# Patient Record
Sex: Male | Born: 1945 | Race: White | Hispanic: No | Marital: Married | Smoking: Current every day smoker
Health system: Southern US, Community
[De-identification: ages and names within clinical notes are randomized; demographics above are authoritative.]

## PROBLEM LIST (undated history)

## (undated) DIAGNOSIS — Z87442 Personal history of urinary calculi: Secondary | ICD-10-CM

## (undated) DIAGNOSIS — I219 Acute myocardial infarction, unspecified: Secondary | ICD-10-CM

## (undated) DIAGNOSIS — K5790 Diverticulosis of intestine, part unspecified, without perforation or abscess without bleeding: Secondary | ICD-10-CM

## (undated) DIAGNOSIS — N400 Enlarged prostate without lower urinary tract symptoms: Secondary | ICD-10-CM

## (undated) DIAGNOSIS — I739 Peripheral vascular disease, unspecified: Secondary | ICD-10-CM

## (undated) DIAGNOSIS — I779 Disorder of arteries and arterioles, unspecified: Secondary | ICD-10-CM

## (undated) DIAGNOSIS — Z9182 Personal history of military deployment: Secondary | ICD-10-CM

## (undated) DIAGNOSIS — F1090 Alcohol use, unspecified, uncomplicated: Secondary | ICD-10-CM

## (undated) DIAGNOSIS — Z9185 Personal history of military service: Secondary | ICD-10-CM

## (undated) DIAGNOSIS — J40 Bronchitis, not specified as acute or chronic: Secondary | ICD-10-CM

## (undated) DIAGNOSIS — J449 Chronic obstructive pulmonary disease, unspecified: Secondary | ICD-10-CM

## (undated) DIAGNOSIS — E785 Hyperlipidemia, unspecified: Secondary | ICD-10-CM

## (undated) DIAGNOSIS — I2510 Atherosclerotic heart disease of native coronary artery without angina pectoris: Secondary | ICD-10-CM

## (undated) DIAGNOSIS — C801 Malignant (primary) neoplasm, unspecified: Secondary | ICD-10-CM

## (undated) DIAGNOSIS — F17290 Nicotine dependence, other tobacco product, uncomplicated: Secondary | ICD-10-CM

## (undated) DIAGNOSIS — C189 Malignant neoplasm of colon, unspecified: Secondary | ICD-10-CM

## (undated) DIAGNOSIS — K8020 Calculus of gallbladder without cholecystitis without obstruction: Secondary | ICD-10-CM

## (undated) DIAGNOSIS — Z7982 Long term (current) use of aspirin: Secondary | ICD-10-CM

## (undated) DIAGNOSIS — I200 Unstable angina: Secondary | ICD-10-CM

## (undated) DIAGNOSIS — F4312 Post-traumatic stress disorder, chronic: Secondary | ICD-10-CM

## (undated) DIAGNOSIS — I10 Essential (primary) hypertension: Secondary | ICD-10-CM

## (undated) DIAGNOSIS — R918 Other nonspecific abnormal finding of lung field: Secondary | ICD-10-CM

## (undated) DIAGNOSIS — I700 Atherosclerosis of aorta: Secondary | ICD-10-CM

## (undated) DIAGNOSIS — M1990 Unspecified osteoarthritis, unspecified site: Secondary | ICD-10-CM

## (undated) HISTORY — DX: Hyperlipidemia, unspecified: E78.5

---

## 2000-07-25 ENCOUNTER — Emergency Department (HOSPITAL_COMMUNITY): Admission: EM | Admit: 2000-07-25 | Discharge: 2000-07-25 | Payer: Self-pay | Admitting: Emergency Medicine

## 2000-07-25 ENCOUNTER — Encounter: Payer: Self-pay | Admitting: Emergency Medicine

## 2014-01-14 ENCOUNTER — Inpatient Hospital Stay (HOSPITAL_COMMUNITY)
Admission: EM | Admit: 2014-01-14 | Discharge: 2014-01-16 | DRG: 282 | Disposition: A | Discharge status: Home / Self Care | Payer: Non-veteran care | Attending: Internal Medicine | Admitting: Internal Medicine

## 2014-01-14 ENCOUNTER — Emergency Department (HOSPITAL_COMMUNITY): Payer: Non-veteran care

## 2014-01-14 ENCOUNTER — Encounter (HOSPITAL_COMMUNITY): Payer: Self-pay | Admitting: Emergency Medicine

## 2014-01-14 DIAGNOSIS — Z720 Tobacco use: Secondary | ICD-10-CM

## 2014-01-14 DIAGNOSIS — I2510 Atherosclerotic heart disease of native coronary artery without angina pectoris: Secondary | ICD-10-CM | POA: Diagnosis present

## 2014-01-14 DIAGNOSIS — I10 Essential (primary) hypertension: Secondary | ICD-10-CM | POA: Diagnosis not present

## 2014-01-14 DIAGNOSIS — Z79899 Other long term (current) drug therapy: Secondary | ICD-10-CM | POA: Diagnosis not present

## 2014-01-14 DIAGNOSIS — F17200 Nicotine dependence, unspecified, uncomplicated: Secondary | ICD-10-CM | POA: Diagnosis not present

## 2014-01-14 DIAGNOSIS — I214 Non-ST elevation (NSTEMI) myocardial infarction: Secondary | ICD-10-CM | POA: Diagnosis not present

## 2014-01-14 DIAGNOSIS — E785 Hyperlipidemia, unspecified: Secondary | ICD-10-CM

## 2014-01-14 DIAGNOSIS — I219 Acute myocardial infarction, unspecified: Secondary | ICD-10-CM | POA: Diagnosis not present

## 2014-01-14 DIAGNOSIS — R079 Chest pain, unspecified: Secondary | ICD-10-CM | POA: Diagnosis not present

## 2014-01-14 HISTORY — DX: Non-ST elevation (NSTEMI) myocardial infarction: I21.4

## 2014-01-14 HISTORY — DX: Bronchitis, not specified as acute or chronic: J40

## 2014-01-14 HISTORY — DX: Essential (primary) hypertension: I10

## 2014-01-14 LAB — CBC WITH DIFFERENTIAL/PLATELET
Basophils Absolute: 0 10*3/uL (ref 0.0–0.1)
Basophils Relative: 0 % (ref 0–1)
Eosinophils Absolute: 0.1 10*3/uL (ref 0.0–0.7)
Eosinophils Relative: 1 % (ref 0–5)
HCT: 41.4 % (ref 39.0–52.0)
Hemoglobin: 14.5 g/dL (ref 13.0–17.0)
Lymphocytes Relative: 26 % (ref 12–46)
Lymphs Abs: 2.6 10*3/uL (ref 0.7–4.0)
MCH: 31.5 pg (ref 26.0–34.0)
MCHC: 35 g/dL (ref 30.0–36.0)
MCV: 89.8 fL (ref 78.0–100.0)
Monocytes Absolute: 0.9 10*3/uL (ref 0.1–1.0)
Monocytes Relative: 9 % (ref 3–12)
Neutro Abs: 6.5 10*3/uL (ref 1.7–7.7)
Neutrophils Relative %: 64 % (ref 43–77)
Platelets: 245 10*3/uL (ref 150–400)
RBC: 4.61 MIL/uL (ref 4.22–5.81)
RDW: 12.4 % (ref 11.5–15.5)
WBC: 10.1 10*3/uL (ref 4.0–10.5)

## 2014-01-14 LAB — COMPREHENSIVE METABOLIC PANEL
ALBUMIN: 4.5 g/dL (ref 3.5–5.2)
ALK PHOS: 61 U/L (ref 39–117)
ALT: 27 U/L (ref 0–53)
AST: 74 U/L — ABNORMAL HIGH (ref 0–37)
Anion gap: 16 — ABNORMAL HIGH (ref 5–15)
BUN: 13 mg/dL (ref 6–23)
CALCIUM: 9.6 mg/dL (ref 8.4–10.5)
CO2: 23 mEq/L (ref 19–32)
Chloride: 97 mEq/L (ref 96–112)
Creatinine, Ser: 0.87 mg/dL (ref 0.50–1.35)
GFR calc non Af Amer: 87 mL/min — ABNORMAL LOW (ref >90)
GLUCOSE: 110 mg/dL — AB (ref 70–99)
POTASSIUM: 3.8 meq/L (ref 3.7–5.3)
SODIUM: 136 meq/L — AB (ref 137–147)
TOTAL PROTEIN: 7.8 g/dL (ref 6.0–8.3)
Total Bilirubin: 0.5 mg/dL (ref 0.3–1.2)

## 2014-01-14 LAB — TROPONIN I
TROPONIN I: 5.25 ng/mL — AB (ref <0.30)
TROPONIN I: 8.62 ng/mL — AB (ref <0.30)
Troponin I: 6.51 ng/mL (ref <0.30)

## 2014-01-14 LAB — PROTIME-INR
INR: 0.93 (ref 0.00–1.49)
PROTHROMBIN TIME: 12.5 s (ref 11.6–15.2)

## 2014-01-14 LAB — MRSA PCR SCREENING: MRSA BY PCR: NEGATIVE

## 2014-01-14 LAB — TSH: TSH: 1.4 u[IU]/mL (ref 0.350–4.500)

## 2014-01-14 LAB — APTT: aPTT: 30 seconds (ref 24–37)

## 2014-01-14 LAB — HEPARIN LEVEL (UNFRACTIONATED): HEPARIN UNFRACTIONATED: 0.23 [IU]/mL — AB (ref 0.30–0.70)

## 2014-01-14 MED ORDER — ALPRAZOLAM 0.25 MG PO TABS
0.2500 mg | ORAL_TABLET | Freq: Two times a day (BID) | ORAL | Status: DC | PRN
Start: 2014-01-14 — End: 2014-01-16

## 2014-01-14 MED ORDER — ASPIRIN 325 MG PO TABS
325.0000 mg | ORAL_TABLET | Freq: Every day | ORAL | Status: DC
  Administered 2014-01-14: 325 mg via ORAL
  Filled 2014-01-14: qty 1

## 2014-01-14 MED ORDER — ASPIRIN EC 81 MG PO TBEC
81.0000 mg | DELAYED_RELEASE_TABLET | Freq: Every day | ORAL | Status: DC
  Administered 2014-01-16: 81 mg via ORAL
  Filled 2014-01-14 (×2): qty 1

## 2014-01-14 MED ORDER — SODIUM CHLORIDE 0.9 % IV SOLN: 250.0000 mL | INTRAVENOUS | Status: DC | PRN

## 2014-01-14 MED ORDER — SERTRALINE HCL 50 MG PO TABS
50.0000 mg | ORAL_TABLET | Freq: Every day | ORAL | Status: DC
  Administered 2014-01-14 – 2014-01-15 (×2): 50 mg via ORAL
  Filled 2014-01-14 (×3): qty 1

## 2014-01-14 MED ORDER — SODIUM CHLORIDE 0.9 % IJ SOLN
3.0000 mL | Freq: Two times a day (BID) | INTRAMUSCULAR | Status: DC
  Administered 2014-01-14: 3 mL via INTRAVENOUS

## 2014-01-14 MED ORDER — OMEGA-3-ACID ETHYL ESTERS 1 G PO CAPS
1.0000 g | ORAL_CAPSULE | Freq: Two times a day (BID) | ORAL | Status: DC
  Administered 2014-01-14 – 2014-01-16 (×5): 1 g via ORAL
  Filled 2014-01-14 (×6): qty 1

## 2014-01-14 MED ORDER — HYDRALAZINE HCL 20 MG/ML IJ SOLN: 10.0000 mg | INTRAMUSCULAR | Status: DC | PRN

## 2014-01-14 MED ORDER — ASPIRIN 81 MG PO CHEW
324.0000 mg | CHEWABLE_TABLET | ORAL | Status: AC
  Administered 2014-01-15: 324 mg via ORAL
  Filled 2014-01-14: qty 4

## 2014-01-14 MED ORDER — HEPARIN BOLUS VIA INFUSION
4000.0000 [IU] | Freq: Once | INTRAVENOUS | Status: AC
  Administered 2014-01-14: 4000 [IU] via INTRAVENOUS
  Filled 2014-01-14: qty 4000

## 2014-01-14 MED ORDER — SODIUM CHLORIDE 0.9 % IJ SOLN: 3.0000 mL | INTRAMUSCULAR | Status: DC | PRN

## 2014-01-14 MED ORDER — ZOLPIDEM TARTRATE 5 MG PO TABS: 5.0000 mg | ORAL_TABLET | Freq: Every evening | ORAL | Status: DC | PRN

## 2014-01-14 MED ORDER — HEPARIN BOLUS VIA INFUSION
1000.0000 [IU] | Freq: Once | INTRAVENOUS | Status: AC
  Administered 2014-01-14: 1000 [IU] via INTRAVENOUS
  Filled 2014-01-14: qty 1000

## 2014-01-14 MED ORDER — HEPARIN (PORCINE) IN NACL 100-0.45 UNIT/ML-% IJ SOLN
950.0000 [IU]/h | INTRAMUSCULAR | Status: DC
  Administered 2014-01-14: 800 [IU]/h via INTRAVENOUS
  Administered 2014-01-15: 950 [IU]/h via INTRAVENOUS
  Filled 2014-01-14 (×4): qty 250

## 2014-01-14 MED ORDER — NITROGLYCERIN 0.4 MG SL SUBL: 0.4000 mg | SUBLINGUAL_TABLET | SUBLINGUAL | Status: DC | PRN

## 2014-01-14 MED ORDER — CARVEDILOL 6.25 MG PO TABS
6.2500 mg | ORAL_TABLET | Freq: Two times a day (BID) | ORAL | Status: DC
  Administered 2014-01-14 – 2014-01-16 (×4): 6.25 mg via ORAL
  Filled 2014-01-14 (×6): qty 1

## 2014-01-14 MED ORDER — PANTOPRAZOLE SODIUM 40 MG PO TBEC
40.0000 mg | DELAYED_RELEASE_TABLET | Freq: Every day | ORAL | Status: DC
  Administered 2014-01-15 – 2014-01-16 (×2): 40 mg via ORAL
  Filled 2014-01-14 (×2): qty 1

## 2014-01-14 MED ORDER — FAMOTIDINE 20 MG PO TABS: 40.0000 mg | ORAL_TABLET | Freq: Once | ORAL | Status: DC

## 2014-01-14 MED ORDER — ONDANSETRON HCL 4 MG/2ML IJ SOLN
4.0000 mg | Freq: Four times a day (QID) | INTRAMUSCULAR | Status: DC | PRN
Start: 2014-01-14 — End: 2014-01-16

## 2014-01-14 MED ORDER — SODIUM CHLORIDE 0.9 % IJ SOLN
3.0000 mL | Freq: Two times a day (BID) | INTRAMUSCULAR | Status: DC
  Administered 2014-01-15: 3 mL via INTRAVENOUS

## 2014-01-14 MED ORDER — ADULT MULTIVITAMIN W/MINERALS CH
1.0000 | ORAL_TABLET | Freq: Every day | ORAL | Status: DC
  Administered 2014-01-14 – 2014-01-16 (×3): 1 via ORAL
  Filled 2014-01-14 (×3): qty 1

## 2014-01-14 MED ORDER — AMLODIPINE BESYLATE 5 MG PO TABS
5.0000 mg | ORAL_TABLET | Freq: Every day | ORAL | Status: DC
  Administered 2014-01-14 – 2014-01-16 (×3): 5 mg via ORAL
  Filled 2014-01-14 (×3): qty 1

## 2014-01-14 MED ORDER — GI COCKTAIL ~~LOC~~: 30.0000 mL | Freq: Once | ORAL | Status: DC

## 2014-01-14 MED ORDER — ASPIRIN 325 MG PO TABS: 325.0000 mg | ORAL_TABLET | Freq: Every day | ORAL | Status: DC

## 2014-01-14 MED ORDER — ACETAMINOPHEN 325 MG PO TABS
650.0000 mg | ORAL_TABLET | ORAL | Status: DC | PRN
  Administered 2014-01-14: 650 mg via ORAL
  Filled 2014-01-14: qty 2

## 2014-01-14 MED ORDER — SODIUM CHLORIDE 0.9 % IV SOLN
INTRAVENOUS | Status: DC
  Administered 2014-01-14: 1000 mL via INTRAVENOUS

## 2014-01-14 MED ORDER — SODIUM CHLORIDE 0.9 % IV SOLN
1.0000 mL/kg/h | INTRAVENOUS | Status: DC
  Administered 2014-01-15: 1 mL/kg/h via INTRAVENOUS

## 2014-01-14 MED ORDER — NITROGLYCERIN IN D5W 200-5 MCG/ML-% IV SOLN
3.0000 ug/min | INTRAVENOUS | Status: DC
  Administered 2014-01-14: 10 ug/min via INTRAVENOUS

## 2014-01-14 MED ORDER — ATORVASTATIN CALCIUM 80 MG PO TABS
80.0000 mg | ORAL_TABLET | Freq: Every day | ORAL | Status: DC
  Administered 2014-01-14 – 2014-01-15 (×2): 80 mg via ORAL
  Filled 2014-01-14 (×3): qty 1

## 2014-01-14 NOTE — ED Notes (Signed)
Pt to transfer to [facility].

## 2014-01-14 NOTE — ED Notes (Signed)
Family at bedside. 

## 2014-01-14 NOTE — ED Provider Notes (Signed)
CSN: [id number]     Arrival date & time 01/14/14  1310 History   First MD Initiated Contact with Patient 01/14/14 1334     Chief Complaint  Patient presents with  . Chest Pain     (Consider location/radiation/quality/duration/timing/severity/associated sxs/prior Treatment) HPI Comments: Patient here complaining of intermittent chest pain which began yesterday at rest. Symptoms have lasted from 5 seconds to 1 minute in radiate to his shoulders as well as to his jaw. No associated dyspnea diaphoresis. Symptoms resolved spontaneously. No treatment used for this. No prior history of same. Patient does state he seemed to be worsening with lying flat and better with sitting up. Denies any association with food. Did take an antacid without relief. He is currently pain-free  The history is provided by the patient.    Past Medical History  Diagnosis Date  . Bronchitis   . Hypertension    History reviewed. No pertinent past surgical history. No family history on file. History  Substance Use Topics  . Smoking status: Current Every Day Smoker    Types: Cigars  . Smokeless tobacco: Not on file  . Alcohol Use: Yes    Review of Systems  All other systems reviewed and are negative.     Allergies  Review of patient's allergies indicates no known allergies.  Home Medications   Prior to Admission medications   Not on File   BP 179/93  Pulse 71  Resp 18  SpO2 96% Physical Exam  Nursing note and vitals reviewed. Constitutional: He is oriented to person, place, and time. He appears well-developed and well-nourished.  Non-toxic appearance. No distress.  HENT:  Head: Normocephalic and atraumatic.  Eyes: Conjunctivae, EOM and lids are normal. Pupils are equal, round, and reactive to light.  Neck: Normal range of motion. Neck supple. No tracheal deviation present. No mass present.  Cardiovascular: Normal rate, regular rhythm and normal heart sounds.  Exam reveals no gallop.   No murmur  heard. Pulmonary/Chest: Effort normal and breath sounds normal. No stridor. No respiratory distress. He has no decreased breath sounds. He has no wheezes. He has no rhonchi. He has no rales.  Abdominal: Soft. Normal appearance and bowel sounds are normal. He exhibits no distension. There is no tenderness. There is no rebound and no CVA tenderness.  Musculoskeletal: Normal range of motion. He exhibits no edema and no tenderness.  Neurological: He is alert and oriented to person, place, and time. He has normal strength. No cranial nerve deficit or sensory deficit. GCS eye subscore is 4. GCS verbal subscore is 5. GCS motor subscore is 6.  Skin: Skin is warm and dry. No abrasion and no rash noted.  Psychiatric: He has a normal mood and affect. His speech is normal and behavior is normal.    ED Course  Procedures (including critical care time) Labs Review Labs Reviewed  CBC WITH DIFFERENTIAL  TROPONIN I  COMPREHENSIVE METABOLIC PANEL    Imaging Review No results found.   EKG Interpretation None      MDM   Final diagnoses:  None    Date: 01/14/2014  Rate: 63  Rhythm: normal sinus rhythm  QRS Axis: normal  Intervals: normal  ST/T Wave abnormalities: ST depressions inferiorly  Conduction Disutrbances:none  Narrative Interpretation:   Old EKG Reviewed: none available   Patient given aspirin and heparin per pharmacy. Spoke with cardiology and he will be transferred to [facility]    Toy Baker, MD 01/14/14 1445

## 2014-01-14 NOTE — ED Notes (Signed)
Pt present with NAD- Pt reports sudden on set with CP without SOB yesterday after late breakfast "felt funny" central chest radiates to left shoulder comes and goes last 5 seconds to 1 minutes. Numerous episodes since yesterday. Relieves with rest . Episodes can come on for no reason. Denies any other symptoms with episodes.

## 2014-01-14 NOTE — ED Notes (Signed)
ecg hooked up at 13:13 leads would not pick up EKG properly [place], RN in room and [place], NT in room also

## 2014-01-14 NOTE — H&P (Signed)
HPI:  68 y/o male cigar smoker and [place] Vet with h/o HTN (untreated). No known CAD. Admitted for NSTEMI.  Says he began to have intermittent chest pain which began yesterday at rest. Symptoms  Would come and go and radiate to his shoulders as well as to his jaw. No associated dyspnea or diaphoresis. Symptoms resolved spontaneously. Pain worse today so came to [facility].   ECG with sinus rhythm and inferior q waves and TWI. (no old). Initial troponin 6.51. Now CP free. Has received ASA, NTG and IV heparin.     Review of Systems:     Cardiac Review of Systems: {Y] = yes  = no  Chest Pain [  y  ]  Resting SOB [   ] Exertional SOB  [  ]  Orthopnea [  ]   Pedal Edema [   ]    Palpitations [  ] Syncope  [  ]   Presyncope [   ]  General Review of Systems: [Y] = yes [  ]=no Constitional: recent weight change [  ]; anorexia [  ]; fatigue [  ]; nausea [  ]; night sweats [  ]; fever [  ]; or chills [  ];                                                                                                                                          Dental: poor dentition[  ];   Eye : blurred vision [  ]; diplopia [   ]; vision changes [  ];  Amaurosis fugax[  ]; Resp: cough [  ];  wheezing[  ];  hemoptysis[  ]; shortness of breath[  ]; paroxysmal nocturnal dyspnea[  ]; dyspnea on exertion[  ]; or orthopnea[  ];  GI:  gallstones[  ], vomiting[  ];  dysphagia[  ]; melena[  ];  hematochezia [  ]; heartburn[  ];   Hx of  Colonoscopy[  ]; GU: kidney stones [  ]; hematuria[  ];   dysuria [  ];  nocturia[  ];  history of     obstruction [  ];                 Skin: rash, swelling[  ];, hair loss[  ];  peripheral edema[  ];  or itching[  ]; Musculosketetal: myalgias[  ];  joint swelling[  ];  joint erythema[  ];  joint pain[  ];  back pain[  ];  Heme/Lymph: bruising[  ];  bleeding[  ];  anemia[  ];  Neuro: TIA[  ];  headaches[  ];  stroke[  ];  vertigo[  ];  seizures[  ];   paresthesias[  ];  difficulty walking[   ];  Psych:depression[  ]; anxiety[  ];  Endocrine: diabetes[  ];  thyroid dysfunction[  ];  Immunizations: Flu [  ];  Pneumococcal[  ];  Other:  Past Medical History  Diagnosis Date  . Bronchitis   . Hypertension     Prior to Admission medications   Medication Sig Start Date End Date Taking? Authorizing Provider  Multiple Vitamin (MULTIVITAMIN WITH MINERALS) TABS tablet Take 1 tablet by mouth daily.   Yes Historical Provider, MD  omega-3 acid ethyl esters (LOVAZA) 1 G capsule Take 1 g by mouth 2 (two) times daily.   Yes Historical Provider, MD  omeprazole (PRILOSEC) 20 MG capsule Take 20 mg by mouth daily.   Yes Historical Provider, MD  sertraline (ZOLOFT) 100 MG tablet Take 50 mg by mouth daily.   Yes Historical Provider, MD     No Known Allergies  History   Social History  . Marital Status: Married    Spouse Name: N/A    Number of Children: N/A  . Years of Education: N/A   Occupational History  . Not on file.   Social History Main Topics  . Smoking status: Current Every Day Smoker    Types: Cigars  . Smokeless tobacco: Not on file  . Alcohol Use: Yes  . Drug Use: Not on file  . Sexual Activity: Not on file   Other Topics Concern  . Not on file   Social History Narrative  . No narrative on file    Family Hx: No family history of CAD. Father worked in a plant and died of asphyxiation.  Mother died of old age Siblings no CAD  PHYSICAL EXAM: Filed Vitals:   01/14/14 1500  BP: 180/84  Pulse: 64  Resp: 27   General:  Well appearing. No respiratory difficulty HEENT: normal Neck: supple. no JVD. Carotids 2+ bilat; no bruits. No lymphadenopathy or thryomegaly appreciated. Cor: PMI nondisplaced. Regular rate & rhythm. No rubs, gallops or murmurs. Lungs: clear Abdomen: soft, nontender, nondistended. No hepatosplenomegaly. No bruits or masses. Good bowel sounds. Extremities: no cyanosis, clubbing, rash, edema Neuro: alert & oriented x 3, cranial nerves  grossly intact. moves all 4 extremities w/o difficulty. Affect pleasant.  ECG: NSR 63. LVH. Inferior Qs. TWI inferorly and laterally  Results for orders placed during the hospital encounter of 01/14/14 (from the past 24 hour(s))  APTT     Status: None   Collection Time    01/14/14  1:22 PM      Result Value Ref Range   aPTT 30  24 - 37 seconds  PROTIME-INR     Status: None   Collection Time    01/14/14  1:22 PM      Result Value Ref Range   Prothrombin Time 12.5  11.6 - 15.2 seconds   INR 0.93  0.00 - 1.49  TROPONIN I     Status: Abnormal   Collection Time    01/14/14  1:32 PM      Result Value Ref Range   Troponin I 6.51 (*) <0.30 ng/mL  CBC WITH DIFFERENTIAL     Status: None   Collection Time    01/14/14  1:32 PM      Result Value Ref Range   WBC 10.1  4.0 - 10.5 K/uL   RBC 4.61  4.22 - 5.81 MIL/uL   Hemoglobin 14.5  13.0 - 17.0 g/dL   HCT 02.5  85.2 - 77.8 %   MCV 89.8  78.0 - 100.0 fL   MCH 31.5  26.0 - 34.0 pg   MCHC 35.0  30.0 - 36.0 g/dL   RDW 24.2  35.3 -  15.5 %   Platelets 245  150 - 400 K/uL   Neutrophils Relative % 64  43 - 77 %   Neutro Abs 6.5  1.7 - 7.7 K/uL   Lymphocytes Relative 26  12 - 46 %   Lymphs Abs 2.6  0.7 - 4.0 K/uL   Monocytes Relative 9  3 - 12 %   Monocytes Absolute 0.9  0.1 - 1.0 K/uL   Eosinophils Relative 1  0 - 5 %   Eosinophils Absolute 0.1  0.0 - 0.7 K/uL   Basophils Relative 0  0 - 1 %   Basophils Absolute 0.0  0.0 - 0.1 K/uL  COMPREHENSIVE METABOLIC PANEL     Status: Abnormal   Collection Time    01/14/14  1:32 PM      Result Value Ref Range   Sodium 136 (*) 137 - 147 mEq/L   Potassium 3.8  3.7 - 5.3 mEq/L   Chloride 97  96 - 112 mEq/L   CO2 23  19 - 32 mEq/L   Glucose, Bld 110 (*) 70 - 99 mg/dL   BUN 13  6 - 23 mg/dL   Creatinine, Ser 5.40  0.50 - 1.35 mg/dL   Calcium 9.6  8.4 - 98.1 mg/dL   Total Protein 7.8  6.0 - 8.3 g/dL   Albumin 4.5  3.5 - 5.2 g/dL   AST 74 (*) 0 - 37 U/L   ALT 27  0 - 53 U/L   Alkaline Phosphatase  61  39 - 117 U/L   Total Bilirubin 0.5  0.3 - 1.2 mg/dL   GFR calc non Af Amer 87 (*) >90 mL/min   GFR calc Af Amer >90  >90 mL/min   Anion gap 16 (*) 5 - 15   Dg Chest 2 View  01/14/2014   CLINICAL DATA:  Chest pain  EXAM: CHEST  2 VIEW  COMPARISON:  None.  FINDINGS: The cardiomediastinal silhouette is unremarkable.  There is no evidence of focal airspace disease, pulmonary edema, suspicious pulmonary nodule/mass, pleural effusion, or pneumothorax. No acute bony abnormalities are identified.  IMPRESSION: No active cardiopulmonary disease.   Electronically Signed   By: Laveda Abbe M.D.   On: 01/14/2014 14:49     ASSESSMENT: 1. NSTEMI - suspect late presenting inferior MI 2. HTN, poorly controlled 3. Tobacco abuse  PLAN/DISCUSSION:  Will admit to ICU. Continue asa, heparin, IV NTG. Add statin and b-blocker. Will also need BP control. Plan cath tomorrow, sooner if develops refractory pain. Counseled on need for smoking cessation.   Ronald [name] 3:32 PM

## 2014-01-14 NOTE — Progress Notes (Signed)
ANTICOAGULATION CONSULT NOTE - Follow Up Consult  Pharmacy Consult for Heparin Indication: chest pain/ACS  No Known Allergies  Patient Measurements: Height: 5\' 9"  (175.3 cm) Weight: 162 lb 0.6 oz (73.5 kg) IBW/kg (Calculated) : 70.7 Heparin Dosing Weight: 73.5 kg  Vital Signs: Temp: 98.7 F (37.1 C) (09/13 2015) Temp src: Oral (09/13 2015) BP: 102/61 mmHg (09/13 2020) Pulse Rate: 79 (09/13 2020)  Labs:  Recent Labs  01/14/14 1322 01/14/14 1332 01/14/14 1719 01/14/14 2120  HGB  --  14.5  --   --   HCT  --  41.4  --   --   PLT  --  245  --   --   APTT 30  --   --   --   LABPROT 12.5  --   --   --   INR 0.93  --   --   --   HEPARINUNFRC  --   --   --  0.23*  CREATININE  --  0.87  --   --   TROPONINI  --  6.51* 5.25*  --     Estimated Creatinine Clearance: 81.3 ml/min (by C-G formula based on Cr of 0.87).   Medications:  Heparin at 800 units/hr (8 ml/hr)  Assessment: 26 YOM who presented to the [facility] on 9/13 with CP and elevated troponins and was transferred to [facility] for further cardiology work-up - planning for cath on Monday, 9/14. Heparin was started prior to transfer and the first level this evening is slightly SUBtherapeutic (HL 0.23, goal of 0.3-0.7). No overt s/sx of bleeding noted.   Goal of Therapy:  Heparin level 0.3-0.7 units/ml Monitor platelets by anticoagulation protocol: Yes   Plan:  1. Heparin 1000 unit bolus x 1 2. Increase heparin to 950 units/hr (9.5 ml/hr) 3. Will continue to monitor for any signs/symptoms of bleeding and will follow up with heparin level in 6 hours   Georgina Pillion, PharmD, BCPS Clinical Pharmacist Pager: [phone] 01/14/2014 10:08 PM

## 2014-01-14 NOTE — Progress Notes (Signed)
ANTICOAGULATION CONSULT NOTE - Initial Consult  Pharmacy Consult for heparin Indication: chest pain/ACS  No Known Allergies  Patient Measurements: Height: 5\' 9"  (175.3 cm) Weight: 180 lb (81.647 kg) IBW/kg (Calculated) : 70.7 Heparin Dosing Weight: 81.6kg  Vital Signs: BP: 179/93 mmHg (09/13 1321) Pulse Rate: 71 (09/13 1321)  Labs:  Recent Labs  01/14/14 1332  HGB 14.5  HCT 41.4  PLT 245  CREATININE 0.87  TROPONINI 6.51*    Estimated Creatinine Clearance: 81.3 ml/min (by C-G formula based on Cr of 0.87).   Medical History: Past Medical History  Diagnosis Date  . Bronchitis   . Hypertension     Medications:  Scheduled:  . aspirin  325 mg Oral Daily   Infusions:  . sodium chloride 1,000 mL (01/14/14 1343)  . heparin    . heparin      Assessment: 32 yoM admitted 9/13 for r/o ACS. Patient reports central CP radiates to L shoulder. Initial troponins elevated to 6.51, no ST changes seen on initial EKG. Patient not on anticoagulation PTA. Pharmacy has been consulted to dose heparin for r/o ACS. Noted patient to be transferred to [facility], heparin gtt should be started prior to transfer.  Baseline INR normal at 0.93  CBC WNL  SCr WNL, CrCl 81 ml/min  Noted patient to be started on aspirin 325mg  daily  Goal of Therapy:  Heparin level 0.3-0.7 units/ml Monitor platelets by anticoagulation protocol: Yes   Plan:  - heparin bolus via infusion bag of 4000 units x1 - start heparin gtt at 800 units/hr (12 units/kg/hr) - 6 hour heparin level - daily CBC and heparin level - f/u daily aspirin dose 325mg  vs 81mg  - monitor for bleeding  Thank you for the consult.  Ross Ludwig, PharmD, BCPS Pager: [phone] Pharmacy: [phone] 01/14/2014 2:54 PM

## 2014-01-14 NOTE — ED Notes (Signed)
MD at bedside. EDP ALLEN AND EDPA OLIVIA PRESENT

## 2014-01-14 NOTE — ED Notes (Signed)
MD at bedside. 

## 2014-01-15 ENCOUNTER — Encounter (HOSPITAL_COMMUNITY)
Admission: EM | Disposition: A | Discharge status: Home / Self Care | Payer: Self-pay | Source: Home / Self Care | Attending: Internal Medicine

## 2014-01-15 DIAGNOSIS — I2510 Atherosclerotic heart disease of native coronary artery without angina pectoris: Secondary | ICD-10-CM

## 2014-01-15 HISTORY — PX: LEFT HEART CATHETERIZATION WITH CORONARY ANGIOGRAM: SHX5451

## 2014-01-15 HISTORY — DX: Atherosclerotic heart disease of native coronary artery without angina pectoris: I25.10

## 2014-01-15 LAB — CBC
HCT: 38.2 % — ABNORMAL LOW (ref 39.0–52.0)
Hemoglobin: 13.3 g/dL (ref 13.0–17.0)
MCH: 31.3 pg (ref 26.0–34.0)
MCHC: 34.8 g/dL (ref 30.0–36.0)
MCV: 89.9 fL (ref 78.0–100.0)
PLATELETS: 233 10*3/uL (ref 150–400)
RBC: 4.25 MIL/uL (ref 4.22–5.81)
RDW: 12.5 % (ref 11.5–15.5)
WBC: 8.4 10*3/uL (ref 4.0–10.5)

## 2014-01-15 LAB — BASIC METABOLIC PANEL
ANION GAP: 12 (ref 5–15)
BUN: 11 mg/dL (ref 6–23)
CALCIUM: 9.1 mg/dL (ref 8.4–10.5)
CO2: 26 mEq/L (ref 19–32)
Chloride: 96 mEq/L (ref 96–112)
Creatinine, Ser: 0.9 mg/dL (ref 0.50–1.35)
GFR, EST NON AFRICAN AMERICAN: 85 mL/min — AB (ref >90)
Glucose, Bld: 112 mg/dL — ABNORMAL HIGH (ref 70–99)
POTASSIUM: 4.1 meq/L (ref 3.7–5.3)
SODIUM: 134 meq/L — AB (ref 137–147)

## 2014-01-15 LAB — LIPID PANEL
CHOLESTEROL: 231 mg/dL — AB (ref 0–200)
HDL: 66 mg/dL (ref >39)
LDL Cholesterol: 145 mg/dL — ABNORMAL HIGH (ref 0–99)
Total CHOL/HDL Ratio: 3.5 RATIO
Triglycerides: 101 mg/dL (ref <150)
VLDL: 20 mg/dL (ref 0–40)

## 2014-01-15 LAB — TROPONIN I: TROPONIN I: 7.94 ng/mL — AB (ref <0.30)

## 2014-01-15 LAB — HEPARIN LEVEL (UNFRACTIONATED): HEPARIN UNFRACTIONATED: 0.42 [IU]/mL (ref 0.30–0.70)

## 2014-01-15 SURGERY — LEFT HEART CATHETERIZATION WITH CORONARY ANGIOGRAM
Anesthesia: LOCAL

## 2014-01-15 MED ORDER — SODIUM CHLORIDE 0.9 % IJ SOLN: 3.0000 mL | Freq: Two times a day (BID) | INTRAMUSCULAR | Status: DC

## 2014-01-15 MED ORDER — SODIUM CHLORIDE 0.9 % IV SOLN
1.0000 mL/kg/h | INTRAVENOUS | Status: AC
  Administered 2014-01-15: 1 mL/kg/h via INTRAVENOUS

## 2014-01-15 MED ORDER — SODIUM CHLORIDE 0.9 % IJ SOLN: 3.0000 mL | INTRAMUSCULAR | Status: DC | PRN

## 2014-01-15 MED ORDER — HEPARIN SODIUM (PORCINE) 1000 UNIT/ML IJ SOLN
INTRAMUSCULAR | Status: AC
  Filled 2014-01-15: qty 1

## 2014-01-15 MED ORDER — CLOPIDOGREL BISULFATE 300 MG PO TABS
300.0000 mg | ORAL_TABLET | Freq: Once | ORAL | Status: AC
  Administered 2014-01-15: 300 mg via ORAL
  Filled 2014-01-15: qty 1

## 2014-01-15 MED ORDER — CLOPIDOGREL BISULFATE 75 MG PO TABS
75.0000 mg | ORAL_TABLET | Freq: Every day | ORAL | Status: DC
  Administered 2014-01-16: 75 mg via ORAL
  Filled 2014-01-15 (×2): qty 1

## 2014-01-15 MED ORDER — ISOSORBIDE MONONITRATE 15 MG HALF TABLET
15.0000 mg | ORAL_TABLET | Freq: Every day | ORAL | Status: DC
  Administered 2014-01-15 – 2014-01-16 (×2): 15 mg via ORAL
  Filled 2014-01-15 (×2): qty 1

## 2014-01-15 MED ORDER — HEPARIN (PORCINE) IN NACL 2-0.9 UNIT/ML-% IJ SOLN
INTRAMUSCULAR | Status: AC
  Filled 2014-01-15: qty 2000

## 2014-01-15 MED ORDER — FENTANYL CITRATE 0.05 MG/ML IJ SOLN
INTRAMUSCULAR | Status: AC
  Filled 2014-01-15: qty 2

## 2014-01-15 MED ORDER — SODIUM CHLORIDE 0.9 % IV SOLN: 250.0000 mL | INTRAVENOUS | Status: DC | PRN

## 2014-01-15 MED ORDER — MIDAZOLAM HCL 2 MG/2ML IJ SOLN
INTRAMUSCULAR | Status: AC
  Filled 2014-01-15: qty 2

## 2014-01-15 MED ORDER — VERAPAMIL HCL 2.5 MG/ML IV SOLN
INTRAVENOUS | Status: AC
  Filled 2014-01-15: qty 2

## 2014-01-15 NOTE — Progress Notes (Signed)
ANTICOAGULATION CONSULT NOTE - Follow Up Consult  Pharmacy Consult for heparin Indication: NSTEMI  Labs:  Recent Labs  01/14/14 1322  01/14/14 1332 01/14/14 1719 01/14/14 2120 01/14/14 2210 01/15/14 0336 01/15/14 0430  HGB  --   --  14.5  --   --   --  13.3  --   HCT  --   --  41.4  --   --   --  38.2*  --   PLT  --   --  245  --   --   --  233  --   APTT 30  --   --   --   --   --   --   --   LABPROT 12.5  --   --   --   --   --   --   --   INR 0.93  --   --   --   --   --   --   --   HEPARINUNFRC  --   --   --   --  0.23*  --   --  0.42  CREATININE  --   --  0.87  --   --   --  0.90  --   TROPONINI  --   < > 6.51* 5.25*  --  8.62* 7.94*  --   < > = values in this interval not displayed.   Assessment/Plan:  68yo male therapeutic on heparin after rate increase. Will continue gtt at current rate and confirm stable with additional level.   Vernard Gambles, PharmD, BCPS  01/15/2014,5:11 AM

## 2014-01-15 NOTE — CV Procedure (Signed)
    Cardiac Catheterization Procedure Note  Name: Ronald [name] MRN: [id number] DOB: 06/13/1945  Procedure: Left Heart Cath, Selective Coronary Angiography, LV angiography  Indication: NSTEMI. This is a 68 year old gentleman with no history of CAD. He does have untreated hypertension. He presented with prolonged episode of chest pain across 24 hours ago. He's had no recurrence of pain since yesterday. His troponin was elevated at approximately 8. He was referred for cardiac catheterization and possible PCI.   Procedural Details: The right wrist was prepped, draped, and anesthetized with 1% lidocaine. Using the modified Seldinger technique, a 5/6 French Slender sheath was introduced into the right radial artery. 3 mg of verapamil was administered through the sheath, weight-based unfractionated heparin was administered intravenously. Standard Judkins catheters were used for selective coronary angiography and left ventriculography. Catheter exchanges were performed over an exchange length guidewire. There were no immediate procedural complications. A TR band was used for radial hemostasis at the completion of the procedure.  The patient was transferred to the post catheterization recovery area for further monitoring.  Procedural Findings: Hemodynamics: AO 93/48 with a mean of 67 LV 93/4  Coronary angiography: Coronary dominance: right  Left mainstem: The left main is patent. There is no pressure dampening with the catheter well into the left main. There does appear to be mild diffuse proximal left main stem stenosis in the range of 30-40%.  Left anterior descending (LAD): The LAD is moderately calcified. The vessel has diffuse irregularity without any significant high-grade stenoses. There is 30-40% stenosis in the mid vessel. The LAD does reach the LV apex. The first diagonal branch is patent and its more superior subbranch has an 80% stenosis in an area where it divides into twin  vessels.  Left circumflex (LCx): The left circumflex is patent. It is relatively small. It supplies 2 small OM branches with no significant disease noted.  Right coronary artery (RCA): This is a dominant vessel. The proximal and mid vessel are diffusely diseased with 40-50% stenosis present. The vessel is tortuous. It LA branch is widely patent. The PDA branch is small in caliber. This branch is subtotally occluded with 99% stenosis in the proximal vessel. This is a very small caliber vessel that appears 1.5 mm or less in diameter.  Left ventriculography: The inferior wall is severely hypokinetic. The anterolateral and apical walls contract normally. The estimated LV EF is 45-50%.  Contrast: 80 cc  Radiation dose/Fluoro time: 4.3 minutes  Estimated Blood Loss: Minimal  Final Conclusions:   1. Severe subtotal stenosis of the right PDA branch, suspected "culprit lesion"  2. Moderate to severe stenosis of the first diagonal of the LAD with mild diffuse LAD stenosis noted  3. No significant disease in the left circumflex  4. Mild to moderate segmental LV systolic dysfunction  Recommendations: Recommend medical therapy for this patient's diffuse coronary artery disease. His culprit lesion is in a PDA branch that is too small for PCI.  Tonny Bollman MD, [facility] 01/15/2014, 12:33 PM

## 2014-01-15 NOTE — Progress Notes (Signed)
Right radial TR band removed, site level 0 and pulses +3 and equal.  [place], Ronald [name]

## 2014-01-15 NOTE — H&P (View-Only) (Signed)
   Subjective:  Admitted with NSTEMI, on IV Hep/NTG. No CP/SOB. For cath today   Objective:  Temp:  [98 F (36.7 C)-98.7 F (37.1 C)] 98 F (36.7 C) (09/14 0733) Pulse Rate:  [64-87] 65 (09/14 0733) Resp:  [16-27] 16 (09/14 0733) BP: (102-183)/(61-93) 116/74 mmHg (09/14 0733) SpO2:  [88 %-98 %] 95 % (09/14 0733) Weight:  [160 lb 0.9 oz (72.6 kg)-180 lb (81.647 kg)] 160 lb 0.9 oz (72.6 kg) (09/14 0344) Weight change:   Intake/Output from previous day: 09/13 0701 - 09/14 0700 In: 730.4 [P.O.:240; I.V.:490.4] Out: 700 [Urine:700]  Intake/Output from this shift: Total I/O In: 281.3 [I.V.:281.3] Out: -   Physical Exam: General appearance: alert and no distress Neck: no adenopathy, no carotid bruit, no JVD, supple, symmetrical, trachea midline and thyroid not enlarged, symmetric, no tenderness/mass/nodules Lungs: clear to auscultation bilaterally Heart: regular rate and rhythm, S1, S2 normal, no murmur, click, rub or gallop Extremities: extremities normal, atraumatic, no cyanosis or edema  Lab Results: Results for orders placed during the hospital encounter of 01/14/14 (from the past 48 hour(s))  APTT     Status: None   Collection Time    01/14/14  1:22 PM      Result Value Ref Range   aPTT 30  24 - 37 seconds  PROTIME-INR     Status: None   Collection Time    01/14/14  1:22 PM      Result Value Ref Range   Prothrombin Time 12.5  11.6 - 15.2 seconds   INR 0.93  0.00 - 1.49  TROPONIN I     Status: Abnormal   Collection Time    01/14/14  1:32 PM      Result Value Ref Range   Troponin I 6.51 (*) <0.30 ng/mL   Comment:            Due to the release kinetics of cTnI,     a negative result within the first hours     of the onset of symptoms does not rule out     myocardial infarction with certainty.     If myocardial infarction is still suspected,     repeat the test at appropriate intervals.     CRITICAL RESULT CALLED TO, READ BACK BY AND VERIFIED WITH:   A.LASSITER RN AT 1415 ON 13SEP15 BY C.BONGEL  CBC WITH DIFFERENTIAL     Status: None   Collection Time    01/14/14  1:32 PM      Result Value Ref Range   WBC 10.1  4.0 - 10.5 K/uL   RBC 4.61  4.22 - 5.81 MIL/uL   Hemoglobin 14.5  13.0 - 17.0 g/dL   HCT 41.4  39.0 - 52.0 %   MCV 89.8  78.0 - 100.0 fL   MCH 31.5  26.0 - 34.0 pg   MCHC 35.0  30.0 - 36.0 g/dL   RDW 12.4  11.5 - 15.5 %   Platelets 245  150 - 400 K/uL   Neutrophils Relative % 64  43 - 77 %   Neutro Abs 6.5  1.7 - 7.7 K/uL   Lymphocytes Relative 26  12 - 46 %   Lymphs Abs 2.6  0.7 - 4.0 K/uL   Monocytes Relative 9  3 - 12 %   Monocytes Absolute 0.9  0.1 - 1.0 K/uL   Eosinophils Relative 1  0 - 5 %   Eosinophils Absolute 0.1  0.0 - 0.7 K/uL   Basophils   Relative 0  0 - 1 %   Basophils Absolute 0.0  0.0 - 0.1 K/uL  COMPREHENSIVE METABOLIC PANEL     Status: Abnormal   Collection Time    01/14/14  1:32 PM      Result Value Ref Range   Sodium 136 (*) 137 - 147 mEq/L   Potassium 3.8  3.7 - 5.3 mEq/L   Chloride 97  96 - 112 mEq/L   CO2 23  19 - 32 mEq/L   Glucose, Bld 110 (*) 70 - 99 mg/dL   BUN 13  6 - 23 mg/dL   Creatinine, Ser 0.87  0.50 - 1.35 mg/dL   Calcium 9.6  8.4 - 10.5 mg/dL   Total Protein 7.8  6.0 - 8.3 g/dL   Albumin 4.5  3.5 - 5.2 g/dL   AST 74 (*) 0 - 37 U/L   ALT 27  0 - 53 U/L   Alkaline Phosphatase 61  39 - 117 U/L   Total Bilirubin 0.5  0.3 - 1.2 mg/dL   GFR calc non Af Amer 87 (*) >90 mL/min   GFR calc Af Amer >90  >90 mL/min   Comment: (NOTE)     The eGFR has been calculated using the CKD EPI equation.     This calculation has not been validated in all clinical situations.     eGFR's persistently <90 mL/min signify possible Chronic Kidney     Disease.   Anion gap 16 (*) 5 - 15  MRSA PCR SCREENING     Status: None   Collection Time    01/14/14  3:54 PM      Result Value Ref Range   MRSA by PCR NEGATIVE  NEGATIVE   Comment:            The GeneXpert MRSA Assay (FDA     approved for NASAL  specimens     only), is one component of a     comprehensive MRSA colonization     surveillance program. It is not     intended to diagnose MRSA     infection nor to guide or     monitor treatment for     MRSA infections.  TROPONIN I     Status: Abnormal   Collection Time    01/14/14  5:19 PM      Result Value Ref Range   Troponin I 5.25 (*) <0.30 ng/mL   Comment:            Due to the release kinetics of cTnI,     a negative result within the first hours     of the onset of symptoms does not rule out     myocardial infarction with certainty.     If myocardial infarction is still suspected,     repeat the test at appropriate intervals.     CRITICAL RESULT CALLED TO, READ BACK BY AND VERIFIED WITH:     TURNER,S RN 01/14/14 1817 WOOTEN,K  TSH     Status: None   Collection Time    01/14/14  5:19 PM      Result Value Ref Range   TSH 1.400  0.350 - 4.500 uIU/mL  HEPARIN LEVEL (UNFRACTIONATED)     Status: Abnormal   Collection Time    01/14/14  9:20 PM      Result Value Ref Range   Heparin Unfractionated 0.23 (*) 0.30 - 0.70 IU/mL   Comment:  IF HEPARIN RESULTS ARE BELOW     EXPECTED VALUES, AND PATIENT     DOSAGE HAS BEEN CONFIRMED,     SUGGEST FOLLOW UP TESTING     OF ANTITHROMBIN III LEVELS.  TROPONIN I     Status: Abnormal   Collection Time    01/14/14 10:10 PM      Result Value Ref Range   Troponin I 8.62 (*) <0.30 ng/mL   Comment:            Due to the release kinetics of cTnI,     a negative result within the first hours     of the onset of symptoms does not rule out     myocardial infarction with certainty.     If myocardial infarction is still suspected,     repeat the test at appropriate intervals.     CRITICAL VALUE NOTED.  VALUE IS CONSISTENT WITH PREVIOUSLY REPORTED AND CALLED VALUE.  TROPONIN I     Status: Abnormal   Collection Time    01/15/14  3:36 AM      Result Value Ref Range   Troponin I 7.94 (*) <0.30 ng/mL   Comment:            Due to  the release kinetics of cTnI,     a negative result within the first hours     of the onset of symptoms does not rule out     myocardial infarction with certainty.     If myocardial infarction is still suspected,     repeat the test at appropriate intervals.     CRITICAL VALUE NOTED.  VALUE IS CONSISTENT WITH PREVIOUSLY REPORTED AND CALLED VALUE.  CBC     Status: Abnormal   Collection Time    01/15/14  3:36 AM      Result Value Ref Range   WBC 8.4  4.0 - 10.5 K/uL   RBC 4.25  4.22 - 5.81 MIL/uL   Hemoglobin 13.3  13.0 - 17.0 g/dL   HCT 38.2 (*) 39.0 - 52.0 %   MCV 89.9  78.0 - 100.0 fL   MCH 31.3  26.0 - 34.0 pg   MCHC 34.8  30.0 - 36.0 g/dL   RDW 12.5  11.5 - 15.5 %   Platelets 233  150 - 400 K/uL  BASIC METABOLIC PANEL     Status: Abnormal   Collection Time    01/15/14  3:36 AM      Result Value Ref Range   Sodium 134 (*) 137 - 147 mEq/L   Potassium 4.1  3.7 - 5.3 mEq/L   Chloride 96  96 - 112 mEq/L   CO2 26  19 - 32 mEq/L   Glucose, Bld 112 (*) 70 - 99 mg/dL   BUN 11  6 - 23 mg/dL   Creatinine, Ser 0.90  0.50 - 1.35 mg/dL   Calcium 9.1  8.4 - 10.5 mg/dL   GFR calc non Af Amer 85 (*) >90 mL/min   GFR calc Af Amer >90  >90 mL/min   Comment: (NOTE)     The eGFR has been calculated using the CKD EPI equation.     This calculation has not been validated in all clinical situations.     eGFR's persistently <90 mL/min signify possible Chronic Kidney     Disease.   Anion gap 12  5 - 15  LIPID PANEL     Status: Abnormal   Collection Time    01/15/14  3:36 AM      Result Value Ref Range   Cholesterol 231 (*) 0 - 200 mg/dL   Triglycerides 101  <150 mg/dL   HDL 66  >39 mg/dL   Total CHOL/HDL Ratio 3.5     VLDL 20  0 - 40 mg/dL   LDL Cholesterol 145 (*) 0 - 99 mg/dL   Comment:            Total Cholesterol/HDL:CHD Risk     Coronary Heart Disease Risk Table                         Men   Women      1/2 Average Risk   3.4   3.3      Average Risk       5.0   4.4      2 X  Average Risk   9.6   7.1      3 X Average Risk  23.4   11.0                Use the calculated Patient Ratio     above and the CHD Risk Table     to determine the patient's CHD Risk.                ATP III CLASSIFICATION (LDL):      <100     mg/dL   Optimal      100-129  mg/dL   Near or Above                        Optimal      130-159  mg/dL   Borderline      160-189  mg/dL   High      >190     mg/dL   Very High  HEPARIN LEVEL (UNFRACTIONATED)     Status: None   Collection Time    01/15/14  4:30 AM      Result Value Ref Range   Heparin Unfractionated 0.42  0.30 - 0.70 IU/mL   Comment:            IF HEPARIN RESULTS ARE BELOW     EXPECTED VALUES, AND PATIENT     DOSAGE HAS BEEN CONFIRMED,     SUGGEST FOLLOW UP TESTING     OF ANTITHROMBIN III LEVELS.    Imaging: Imaging results have been reviewed  Tele: NSR  Assessment/Plan:   1. Active Problems: 2.   NSTEMI (non-ST elevated myocardial infarction) 3.   Essential hypertension 4.   Time Spent Directly with Patient:  20 minutes  Length of Stay:  LOS: 1 day   Pt admitted for NSTEMI. Trop peaked at 7. EKG showed inferolateral TWI. On IV hep/NTG. Exam benign. Labs OK. Discussed cath today including risk, DES vs BMS. On approp meds.  Ronald [name] 01/15/2014, 10:53 AM 

## 2014-01-15 NOTE — Progress Notes (Signed)
Subjective:  Admitted with NSTEMI, on IV Hep/NTG. No CP/SOB. For cath today   Objective:  Temp:  [98 F (36.7 C)-98.7 F (37.1 C)] 98 F (36.7 C) (09/14 0733) Pulse Rate:  [64-87] 65 (09/14 0733) Resp:  [16-27] 16 (09/14 0733) BP: (102-183)/(61-93) 116/74 mmHg (09/14 0733) SpO2:  [88 %-98 %] 95 % (09/14 0733) Weight:  [160 lb 0.9 oz (72.6 kg)-180 lb (81.647 kg)] 160 lb 0.9 oz (72.6 kg) (09/14 0344) Weight change:   Intake/Output from previous day: 09/13 0701 - 09/14 0700 In: 730.4 [P.O.:240; I.V.:490.4] Out: 700 [Urine:700]  Intake/Output from this shift: Total I/O In: 281.3 [I.V.:281.3] Out: -   Physical Exam: General appearance: alert and no distress Neck: no adenopathy, no carotid bruit, no JVD, supple, symmetrical, trachea midline and thyroid not enlarged, symmetric, no tenderness/mass/nodules Lungs: clear to auscultation bilaterally Heart: regular rate and rhythm, S1, S2 normal, no murmur, click, rub or gallop Extremities: extremities normal, atraumatic, no cyanosis or edema  Lab Results: Results for orders placed during the hospital encounter of 01/14/14 (from the past 48 hour(s))  APTT     Status: None   Collection Time    01/14/14  1:22 PM      Result Value Ref Range   aPTT 30  24 - 37 seconds  PROTIME-INR     Status: None   Collection Time    01/14/14  1:22 PM      Result Value Ref Range   Prothrombin Time 12.5  11.6 - 15.2 seconds   INR 0.93  0.00 - 1.49  TROPONIN I     Status: Abnormal   Collection Time    01/14/14  1:32 PM      Result Value Ref Range   Troponin I 6.51 (*) <0.30 ng/mL   Comment:            Due to the release kinetics of cTnI,     a negative result within the first hours     of the onset of symptoms does not rule out     myocardial infarction with certainty.     If myocardial infarction is still suspected,     repeat the test at appropriate intervals.     CRITICAL RESULT CALLED TO, READ BACK BY AND VERIFIED WITH:   A.LASSITER RN AT 1415 ON 13SEP15 BY C.BONGEL  CBC WITH DIFFERENTIAL     Status: None   Collection Time    01/14/14  1:32 PM      Result Value Ref Range   WBC 10.1  4.0 - 10.5 K/uL   RBC 4.61  4.22 - 5.81 MIL/uL   Hemoglobin 14.5  13.0 - 17.0 g/dL   HCT 41.4  39.0 - 52.0 %   MCV 89.8  78.0 - 100.0 fL   MCH 31.5  26.0 - 34.0 pg   MCHC 35.0  30.0 - 36.0 g/dL   RDW 12.4  11.5 - 15.5 %   Platelets 245  150 - 400 K/uL   Neutrophils Relative % 64  43 - 77 %   Neutro Abs 6.5  1.7 - 7.7 K/uL   Lymphocytes Relative 26  12 - 46 %   Lymphs Abs 2.6  0.7 - 4.0 K/uL   Monocytes Relative 9  3 - 12 %   Monocytes Absolute 0.9  0.1 - 1.0 K/uL   Eosinophils Relative 1  0 - 5 %   Eosinophils Absolute 0.1  0.0 - 0.7 K/uL   Basophils  Relative 0  0 - 1 %   Basophils Absolute 0.0  0.0 - 0.1 K/uL  COMPREHENSIVE METABOLIC PANEL     Status: Abnormal   Collection Time    01/14/14  1:32 PM      Result Value Ref Range   Sodium 136 (*) 137 - 147 mEq/L   Potassium 3.8  3.7 - 5.3 mEq/L   Chloride 97  96 - 112 mEq/L   CO2 23  19 - 32 mEq/L   Glucose, Bld 110 (*) 70 - 99 mg/dL   BUN 13  6 - 23 mg/dL   Creatinine, Ser 0.87  0.50 - 1.35 mg/dL   Calcium 9.6  8.4 - 10.5 mg/dL   Total Protein 7.8  6.0 - 8.3 g/dL   Albumin 4.5  3.5 - 5.2 g/dL   AST 74 (*) 0 - 37 U/L   ALT 27  0 - 53 U/L   Alkaline Phosphatase 61  39 - 117 U/L   Total Bilirubin 0.5  0.3 - 1.2 mg/dL   GFR calc non Af Amer 87 (*) >90 mL/min   GFR calc Af Amer >90  >90 mL/min   Comment: (NOTE)     The eGFR has been calculated using the CKD EPI equation.     This calculation has not been validated in all clinical situations.     eGFR's persistently <90 mL/min signify possible Chronic Kidney     Disease.   Anion gap 16 (*) 5 - 15  MRSA PCR SCREENING     Status: None   Collection Time    01/14/14  3:54 PM      Result Value Ref Range   MRSA by PCR NEGATIVE  NEGATIVE   Comment:            The GeneXpert MRSA Assay (FDA     approved for NASAL  specimens     only), is one component of a     comprehensive MRSA colonization     surveillance program. It is not     intended to diagnose MRSA     infection nor to guide or     monitor treatment for     MRSA infections.  TROPONIN I     Status: Abnormal   Collection Time    01/14/14  5:19 PM      Result Value Ref Range   Troponin I 5.25 (*) <0.30 ng/mL   Comment:            Due to the release kinetics of cTnI,     a negative result within the first hours     of the onset of symptoms does not rule out     myocardial infarction with certainty.     If myocardial infarction is still suspected,     repeat the test at appropriate intervals.     CRITICAL RESULT CALLED TO, READ BACK BY AND VERIFIED WITH:     TURNER,S RN 01/14/14 1817 WOOTEN,K  TSH     Status: None   Collection Time    01/14/14  5:19 PM      Result Value Ref Range   TSH 1.400  0.350 - 4.500 uIU/mL  HEPARIN LEVEL (UNFRACTIONATED)     Status: Abnormal   Collection Time    01/14/14  9:20 PM      Result Value Ref Range   Heparin Unfractionated 0.23 (*) 0.30 - 0.70 IU/mL   Comment:  IF HEPARIN RESULTS ARE BELOW     EXPECTED VALUES, AND PATIENT     DOSAGE HAS BEEN CONFIRMED,     SUGGEST FOLLOW UP TESTING     OF ANTITHROMBIN III LEVELS.  TROPONIN I     Status: Abnormal   Collection Time    01/14/14 10:10 PM      Result Value Ref Range   Troponin I 8.62 (*) <0.30 ng/mL   Comment:            Due to the release kinetics of cTnI,     a negative result within the first hours     of the onset of symptoms does not rule out     myocardial infarction with certainty.     If myocardial infarction is still suspected,     repeat the test at appropriate intervals.     CRITICAL VALUE NOTED.  VALUE IS CONSISTENT WITH PREVIOUSLY REPORTED AND CALLED VALUE.  TROPONIN I     Status: Abnormal   Collection Time    01/15/14  3:36 AM      Result Value Ref Range   Troponin I 7.94 (*) <0.30 ng/mL   Comment:            Due to  the release kinetics of cTnI,     a negative result within the first hours     of the onset of symptoms does not rule out     myocardial infarction with certainty.     If myocardial infarction is still suspected,     repeat the test at appropriate intervals.     CRITICAL VALUE NOTED.  VALUE IS CONSISTENT WITH PREVIOUSLY REPORTED AND CALLED VALUE.  CBC     Status: Abnormal   Collection Time    01/15/14  3:36 AM      Result Value Ref Range   WBC 8.4  4.0 - 10.5 K/uL   RBC 4.25  4.22 - 5.81 MIL/uL   Hemoglobin 13.3  13.0 - 17.0 g/dL   HCT 38.2 (*) 39.0 - 52.0 %   MCV 89.9  78.0 - 100.0 fL   MCH 31.3  26.0 - 34.0 pg   MCHC 34.8  30.0 - 36.0 g/dL   RDW 12.5  11.5 - 15.5 %   Platelets 233  150 - 400 K/uL  BASIC METABOLIC PANEL     Status: Abnormal   Collection Time    01/15/14  3:36 AM      Result Value Ref Range   Sodium 134 (*) 137 - 147 mEq/L   Potassium 4.1  3.7 - 5.3 mEq/L   Chloride 96  96 - 112 mEq/L   CO2 26  19 - 32 mEq/L   Glucose, Bld 112 (*) 70 - 99 mg/dL   BUN 11  6 - 23 mg/dL   Creatinine, Ser 0.90  0.50 - 1.35 mg/dL   Calcium 9.1  8.4 - 10.5 mg/dL   GFR calc non Af Amer 85 (*) >90 mL/min   GFR calc Af Amer >90  >90 mL/min   Comment: (NOTE)     The eGFR has been calculated using the CKD EPI equation.     This calculation has not been validated in all clinical situations.     eGFR's persistently <90 mL/min signify possible Chronic Kidney     Disease.   Anion gap 12  5 - 15  LIPID PANEL     Status: Abnormal   Collection Time    01/15/14  3:36 AM      Result Value Ref Range   Cholesterol 231 (*) 0 - 200 mg/dL   Triglycerides 101  <150 mg/dL   HDL 66  >39 mg/dL   Total CHOL/HDL Ratio 3.5     VLDL 20  0 - 40 mg/dL   LDL Cholesterol 145 (*) 0 - 99 mg/dL   Comment:            Total Cholesterol/HDL:CHD Risk     Coronary Heart Disease Risk Table                         Men   Women      1/2 Average Risk   3.4   3.3      Average Risk       5.0   4.4      2 X  Average Risk   9.6   7.1      3 X Average Risk  23.4   11.0                Use the calculated Patient Ratio     above and the CHD Risk Table     to determine the patient's CHD Risk.                ATP III CLASSIFICATION (LDL):      <100     mg/dL   Optimal      100-129  mg/dL   Near or Above                        Optimal      130-159  mg/dL   Borderline      160-189  mg/dL   High      >190     mg/dL   Very High  HEPARIN LEVEL (UNFRACTIONATED)     Status: None   Collection Time    01/15/14  4:30 AM      Result Value Ref Range   Heparin Unfractionated 0.42  0.30 - 0.70 IU/mL   Comment:            IF HEPARIN RESULTS ARE BELOW     EXPECTED VALUES, AND PATIENT     DOSAGE HAS BEEN CONFIRMED,     SUGGEST FOLLOW UP TESTING     OF ANTITHROMBIN III LEVELS.    Imaging: Imaging results have been reviewed  Tele: NSR  Assessment/Plan:   1. Active Problems: 2.   NSTEMI (non-ST elevated myocardial infarction) 3.   Essential hypertension 4.   Time Spent Directly with Patient:  20 minutes  Length of Stay:  LOS: 1 day   Pt admitted for NSTEMI. Trop peaked at 7. EKG showed inferolateral TWI. On IV hep/NTG. Exam benign. Labs OK. Discussed cath today including risk, DES vs BMS. On approp meds.  Lorretta Harp 01/15/2014, 10:53 AM

## 2014-01-15 NOTE — Interval H&P Note (Signed)
History and Physical Interval Note:  01/15/2014 11:29 AM  Ronald [name]  has presented today for surgery, with the diagnosis of NSTEMI  The various methods of treatment have been discussed with the patient and family. After consideration of risks, benefits and other options for treatment, the patient has consented to  Procedure(s): LEFT HEART CATHETERIZATION WITH CORONARY ANGIOGRAM (N/A) as a surgical intervention .  The patient's history has been reviewed, patient examined, no change in status, stable for surgery.  I have reviewed the patient's chart and labs.  Questions were answered to the patient's satisfaction.    Cath Lab Visit (complete for each Cath Lab visit)  Clinical Evaluation Leading to the Procedure:   ACS: Yes.    Non-ACS:    Anginal Classification: CCS IV  Anti-ischemic medical therapy: Maximal Therapy (2 or more classes of medications)  Non-Invasive Test Results: No non-invasive testing performed  Prior CABG: No previous CABG       Tonny Bollman

## 2014-01-15 NOTE — Progress Notes (Signed)
CARE MANAGEMENT NOTE 01/15/2014  Patient:  Ronald [name], Ronald [name]   Account Number:  [id number]  Date Initiated:  01/15/2014  Documentation initiated by:  Alexys Gassett  Subjective/Objective Assessment:   ruled in for stemi/to cardiac cath [id number]     Action/Plan:   from home lives with spouse and is normally able to be indep. in all adls   Anticipated DC Date:  01/18/2014   Anticipated DC Plan:  HOME/SELF CARE  In-house referral  NA      DC Planning Services  CM consult      PAC Choice  NA   Choice offered to / List presented to:  NA   DME arranged  NA      DME agency  NA     HH arranged  NA      HH agency  NA   Status of service:  In process, will continue to follow Medicare Important Message given?   (If response is "NO", the following Medicare IM given date fields will be blank) Date Medicare IM given:   Medicare IM given by:   Date Additional Medicare IM given:   Additional Medicare IM given by:    Discharge Disposition:    Per UR Regulation:  Reviewed for med. necessity/level of care/duration of stay  If discussed at Long Length of Stay Meetings, dates discussed:    Comments:  Bjorn Loser Lasean Rahming,RN,BSN,CCM

## 2014-01-16 DIAGNOSIS — Z720 Tobacco use: Secondary | ICD-10-CM

## 2014-01-16 DIAGNOSIS — E785 Hyperlipidemia, unspecified: Secondary | ICD-10-CM

## 2014-01-16 LAB — GLUCOSE, CAPILLARY: Glucose-Capillary: 107 mg/dL — ABNORMAL HIGH (ref 70–99)

## 2014-01-16 LAB — CBC
HCT: 35.7 % — ABNORMAL LOW (ref 39.0–52.0)
HEMOGLOBIN: 12.2 g/dL — AB (ref 13.0–17.0)
MCH: 30.8 pg (ref 26.0–34.0)
MCHC: 34.2 g/dL (ref 30.0–36.0)
MCV: 90.2 fL (ref 78.0–100.0)
Platelets: 207 10*3/uL (ref 150–400)
RBC: 3.96 MIL/uL — AB (ref 4.22–5.81)
RDW: 12.5 % (ref 11.5–15.5)
WBC: 7.9 10*3/uL (ref 4.0–10.5)

## 2014-01-16 LAB — HEMOGLOBIN A1C
HEMOGLOBIN A1C: 5.6 % (ref <5.7)
Mean Plasma Glucose: 114 mg/dL (ref <117)

## 2014-01-16 MED ORDER — ASPIRIN 81 MG PO TBEC: 81.0000 mg | DELAYED_RELEASE_TABLET | Freq: Every day | ORAL | Status: DC

## 2014-01-16 MED ORDER — CLOPIDOGREL BISULFATE 75 MG PO TABS: 75.0000 mg | ORAL_TABLET | Freq: Every day | ORAL | Status: DC

## 2014-01-16 MED ORDER — ISOSORBIDE MONONITRATE ER 30 MG PO TB24: 15.0000 mg | ORAL_TABLET | Freq: Every day | ORAL | Status: DC

## 2014-01-16 MED ORDER — ATORVASTATIN CALCIUM 80 MG PO TABS: 80.0000 mg | ORAL_TABLET | Freq: Every day | ORAL | Status: DC

## 2014-01-16 MED ORDER — PANTOPRAZOLE SODIUM 40 MG PO TBEC: 40.0000 mg | DELAYED_RELEASE_TABLET | Freq: Every day | ORAL | Status: DC

## 2014-01-16 MED ORDER — LISINOPRIL 2.5 MG PO TABS
2.5000 mg | ORAL_TABLET | Freq: Every day | ORAL | Status: DC
  Filled 2014-01-16: qty 1

## 2014-01-16 MED ORDER — CARVEDILOL 6.25 MG PO TABS: 6.2500 mg | ORAL_TABLET | Freq: Two times a day (BID) | ORAL | Status: DC

## 2014-01-16 MED ORDER — NITROGLYCERIN 0.4 MG SL SUBL: 0.4000 mg | SUBLINGUAL_TABLET | SUBLINGUAL | Status: DC | PRN

## 2014-01-16 MED ORDER — LISINOPRIL 2.5 MG PO TABS: 2.5000 mg | ORAL_TABLET | Freq: Every day | ORAL | Status: DC

## 2014-01-16 NOTE — Progress Notes (Signed)
   Subjective:  No CP/SOB, s/p cath radially by Dr. MC  Objective:  Temp:  [97.9 F (36.6 C)-98.4 F (36.9 C)] 98.4 F (36.9 C) (09/15 0810) Pulse Rate:  [60-73] 73 (09/15 0810) Resp:  [12-21] 21 (09/15 0810) BP: (106-145)/(50-76) 145/72 mmHg (09/15 0810) SpO2:  [93 %-98 %] 97 % (09/15 0810) Weight:  [162 lb 0.6 oz (73.5 kg)] 162 lb 0.6 oz (73.5 kg) (09/15 0300) Weight change: -17 lb 15.4 oz (-8.147 kg)  Intake/Output from previous day: 09/14 0701 - 09/15 0700 In: 1907.4 [P.O.:920; I.V.:987.4] Out: 275 [Urine:275]  Intake/Output from this shift:    Physical Exam: General appearance: alert and no distress Neck: no adenopathy, no carotid bruit, no JVD, supple, symmetrical, trachea midline and thyroid not enlarged, symmetric, no tenderness/mass/nodules Lungs: clear to auscultation bilaterally Heart: regular rate and rhythm, S1, S2 normal, no murmur, click, rub or gallop Extremities: extremities normal, atraumatic, no cyanosis or edema and RRA puncture site OK  Lab Results: Results for orders placed during the hospital encounter of 01/14/14 (from the past 48 hour(s))  APTT     Status: None   Collection Time    01/14/14  1:22 PM      Result Value Ref Range   aPTT 30  24 - 37 seconds  PROTIME-INR     Status: None   Collection Time    01/14/14  1:22 PM      Result Value Ref Range   Prothrombin Time 12.5  11.6 - 15.2 seconds   INR 0.93  0.00 - 1.49  TROPONIN I     Status: Abnormal   Collection Time    01/14/14  1:32 PM      Result Value Ref Range   Troponin I 6.51 (*) <0.30 ng/mL   Comment:            Due to the release kinetics of cTnI,     a negative result within the first hours     of the onset of symptoms does not rule out     myocardial infarction with certainty.     If myocardial infarction is still suspected,     repeat the test at appropriate intervals.     CRITICAL RESULT CALLED TO, READ BACK BY AND VERIFIED WITH:     A.LASSITER RN AT 1415 ON 13SEP15 BY  C.BONGEL  CBC WITH DIFFERENTIAL     Status: None   Collection Time    01/14/14  1:32 PM      Result Value Ref Range   WBC 10.1  4.0 - 10.5 K/uL   RBC 4.61  4.22 - 5.81 MIL/uL   Hemoglobin 14.5  13.0 - 17.0 g/dL   HCT 41.4  39.0 - 52.0 %   MCV 89.8  78.0 - 100.0 fL   MCH 31.5  26.0 - 34.0 pg   MCHC 35.0  30.0 - 36.0 g/dL   RDW 12.4  11.5 - 15.5 %   Platelets 245  150 - 400 K/uL   Neutrophils Relative % 64  43 - 77 %   Neutro Abs 6.5  1.7 - 7.7 K/uL   Lymphocytes Relative 26  12 - 46 %   Lymphs Abs 2.6  0.7 - 4.0 K/uL   Monocytes Relative 9  3 - 12 %   Monocytes Absolute 0.9  0.1 - 1.0 K/uL   Eosinophils Relative 1  0 - 5 %   Eosinophils Absolute 0.1  0.0 - 0.7 K/uL   Basophils   Relative 0  0 - 1 %   Basophils Absolute 0.0  0.0 - 0.1 K/uL  COMPREHENSIVE METABOLIC PANEL     Status: Abnormal   Collection Time    01/14/14  1:32 PM      Result Value Ref Range   Sodium 136 (*) 137 - 147 mEq/L   Potassium 3.8  3.7 - 5.3 mEq/L   Chloride 97  96 - 112 mEq/L   CO2 23  19 - 32 mEq/L   Glucose, Bld 110 (*) 70 - 99 mg/dL   BUN 13  6 - 23 mg/dL   Creatinine, Ser 0.87  0.50 - 1.35 mg/dL   Calcium 9.6  8.4 - 10.5 mg/dL   Total Protein 7.8  6.0 - 8.3 g/dL   Albumin 4.5  3.5 - 5.2 g/dL   AST 74 (*) 0 - 37 U/L   ALT 27  0 - 53 U/L   Alkaline Phosphatase 61  39 - 117 U/L   Total Bilirubin 0.5  0.3 - 1.2 mg/dL   GFR calc non Af Amer 87 (*) >90 mL/min   GFR calc Af Amer >90  >90 mL/min   Comment: (NOTE)     The eGFR has been calculated using the CKD EPI equation.     This calculation has not been validated in all clinical situations.     eGFR's persistently <90 mL/min signify possible Chronic Kidney     Disease.   Anion gap 16 (*) 5 - 15  MRSA PCR SCREENING     Status: None   Collection Time    01/14/14  3:54 PM      Result Value Ref Range   MRSA by PCR NEGATIVE  NEGATIVE   Comment:            The GeneXpert MRSA Assay (FDA     approved for NASAL specimens     only), is one component  of a     comprehensive MRSA colonization     surveillance program. It is not     intended to diagnose MRSA     infection nor to guide or     monitor treatment for     MRSA infections.  TROPONIN I     Status: Abnormal   Collection Time    01/14/14  5:19 PM      Result Value Ref Range   Troponin I 5.25 (*) <0.30 ng/mL   Comment:            Due to the release kinetics of cTnI,     a negative result within the first hours     of the onset of symptoms does not rule out     myocardial infarction with certainty.     If myocardial infarction is still suspected,     repeat the test at appropriate intervals.     CRITICAL RESULT CALLED TO, READ BACK BY AND VERIFIED WITH:     TURNER,S RN 01/14/14 1817 WOOTEN,K  TSH     Status: None   Collection Time    01/14/14  5:19 PM      Result Value Ref Range   TSH 1.400  0.350 - 4.500 uIU/mL  HEPARIN LEVEL (UNFRACTIONATED)     Status: Abnormal   Collection Time    01/14/14  9:20 PM      Result Value Ref Range   Heparin Unfractionated 0.23 (*) 0.30 - 0.70 IU/mL   Comment:              IF HEPARIN RESULTS ARE BELOW     EXPECTED VALUES, AND PATIENT     DOSAGE HAS BEEN CONFIRMED,     SUGGEST FOLLOW UP TESTING     OF ANTITHROMBIN III LEVELS.  TROPONIN I     Status: Abnormal   Collection Time    01/14/14 10:10 PM      Result Value Ref Range   Troponin I 8.62 (*) <0.30 ng/mL   Comment:            Due to the release kinetics of cTnI,     a negative result within the first hours     of the onset of symptoms does not rule out     myocardial infarction with certainty.     If myocardial infarction is still suspected,     repeat the test at appropriate intervals.     CRITICAL VALUE NOTED.  VALUE IS CONSISTENT WITH PREVIOUSLY REPORTED AND CALLED VALUE.  TROPONIN I     Status: Abnormal   Collection Time    01/15/14  3:36 AM      Result Value Ref Range   Troponin I 7.94 (*) <0.30 ng/mL   Comment:            Due to the release kinetics of cTnI,     a  negative result within the first hours     of the onset of symptoms does not rule out     myocardial infarction with certainty.     If myocardial infarction is still suspected,     repeat the test at appropriate intervals.     CRITICAL VALUE NOTED.  VALUE IS CONSISTENT WITH PREVIOUSLY REPORTED AND CALLED VALUE.  CBC     Status: Abnormal   Collection Time    01/15/14  3:36 AM      Result Value Ref Range   WBC 8.4  4.0 - 10.5 K/uL   RBC 4.25  4.22 - 5.81 MIL/uL   Hemoglobin 13.3  13.0 - 17.0 g/dL   HCT 38.2 (*) 39.0 - 52.0 %   MCV 89.9  78.0 - 100.0 fL   MCH 31.3  26.0 - 34.0 pg   MCHC 34.8  30.0 - 36.0 g/dL   RDW 12.5  11.5 - 15.5 %   Platelets 233  150 - 400 K/uL  BASIC METABOLIC PANEL     Status: Abnormal   Collection Time    01/15/14  3:36 AM      Result Value Ref Range   Sodium 134 (*) 137 - 147 mEq/L   Potassium 4.1  3.7 - 5.3 mEq/L   Chloride 96  96 - 112 mEq/L   CO2 26  19 - 32 mEq/L   Glucose, Bld 112 (*) 70 - 99 mg/dL   BUN 11  6 - 23 mg/dL   Creatinine, Ser 0.90  0.50 - 1.35 mg/dL   Calcium 9.1  8.4 - 10.5 mg/dL   GFR calc non Af Amer 85 (*) >90 mL/min   GFR calc Af Amer >90  >90 mL/min   Comment: (NOTE)     The eGFR has been calculated using the CKD EPI equation.     This calculation has not been validated in all clinical situations.     eGFR's persistently <90 mL/min signify possible Chronic Kidney     Disease.   Anion gap 12  5 - 15  LIPID PANEL     Status: Abnormal   Collection Time    01/15/14  3:36 AM      Result Value Ref Range   Cholesterol 231 (*) 0 - 200 mg/dL   Triglycerides 101  <150 mg/dL   HDL 66  >39 mg/dL   Total CHOL/HDL Ratio 3.5     VLDL 20  0 - 40 mg/dL   LDL Cholesterol 145 (*) 0 - 99 mg/dL   Comment:            Total Cholesterol/HDL:CHD Risk     Coronary Heart Disease Risk Table                         Men   Women      1/2 Average Risk   3.4   3.3      Average Risk       5.0   4.4      2 X Average Risk   9.6   7.1      3 X Average  Risk  23.4   11.0                Use the calculated Patient Ratio     above and the CHD Risk Table     to determine the patient's CHD Risk.                ATP III CLASSIFICATION (LDL):      <100     mg/dL   Optimal      100-129  mg/dL   Near or Above                        Optimal      130-159  mg/dL   Borderline      160-189  mg/dL   High      >190     mg/dL   Very High  HEPARIN LEVEL (UNFRACTIONATED)     Status: None   Collection Time    01/15/14  4:30 AM      Result Value Ref Range   Heparin Unfractionated 0.42  0.30 - 0.70 IU/mL   Comment:            IF HEPARIN RESULTS ARE BELOW     EXPECTED VALUES, AND PATIENT     DOSAGE HAS BEEN CONFIRMED,     SUGGEST FOLLOW UP TESTING     OF ANTITHROMBIN III LEVELS.  CBC     Status: Abnormal   Collection Time    01/16/14  3:13 AM      Result Value Ref Range   WBC 7.9  4.0 - 10.5 K/uL   RBC 3.96 (*) 4.22 - 5.81 MIL/uL   Hemoglobin 12.2 (*) 13.0 - 17.0 g/dL   HCT 35.7 (*) 39.0 - 52.0 %   MCV 90.2  78.0 - 100.0 fL   MCH 30.8  26.0 - 34.0 pg   MCHC 34.2  30.0 - 36.0 g/dL   RDW 12.5  11.5 - 15.5 %   Platelets 207  150 - 400 K/uL  GLUCOSE, CAPILLARY     Status: Abnormal   Collection Time    01/16/14  8:14 AM      Result Value Ref Range   Glucose-Capillary 107 (*) 70 - 99 mg/dL   Comment 1 Capillary Sample      Imaging: Imaging results have been reviewed  Assessment/Plan:   1. Active Problems: 2.   NSTEMI (non-ST elevated myocardial infarction) 3.   Essential hypertension 4.     Time Spent Directly with Patient:  20 minutes  Length of Stay:  LOS: 2 days   Pt admitted for NSTEMI. Peak trop around 8. Cath revealed distal PDA/PLA disease. Vessel to small for intervention. 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 Rec med Rx. Pt is on appropriate meds. Discussed CRF mod including smoking cessation. Exam benign. OK for D/C home. ROV with a MLP 2 weeks and with Dr. Burt Knack after that.  Lorretta Harp 01/16/2014, 10:15 AM

## 2014-01-16 NOTE — Progress Notes (Signed)
D/C instructions reviewed with pt and his wife. Copy of instructions given to pt. Pt given handouts on MI, HTN, radial post cath care and all new meds. Pt declined wheelchair, pt has steady gait, pt walked out with wife with belongings.

## 2014-01-16 NOTE — Progress Notes (Signed)
CARDIAC REHAB PHASE I   PRE:  Rate/Rhythm: 69 SR  BP:  Supine:   Sitting: 132/67  Standing:    SaO2:   MODE:  Ambulation: 700 ft   POST:  Rate/Rhythm: 87 SR  BP:  Supine:   Sitting: 146/63  Standing:   SaO2:  0945-1055 Pt tolerated ambulation well without c/o of cp or SOB. VS stable. Pt back to side of bed after walk. Completed MI education with pt. and wife. He voices understanding. Pt agrees to Outpt. CRP in [place], will send referral. Discussed smoking cessation with pt., states that he will slow down. He does not seem very motivated to quit totally. I gave him tips for quitting, quit smart class information and coaching contact number.I have strongly encouraged cessation.  Melina Copa RN 01/16/2014 10:46 AM

## 2014-01-16 NOTE — Discharge Summary (Signed)
Physician Discharge Summary  Patient ID: Ronald [name] MRN: [id number] DOB/AGE: July 14, 1945 68 y.o.  Admit date: 01/14/2014 Discharge date: 01/16/2014 Primary Cardiologist: Dr. Excell Seltzer  Admission Diagnoses: NSTEMI   Discharge Diagnoses:  Active Problems:   NSTEMI (non-ST elevated myocardial infarction)   Essential hypertension   Tobacco abuse   Discharged Condition: stable  Patient Profile: 68 y/o male cigar smoker and [place] Vet with h/o HTN (untreated). No known CAD. Admitted for NSTEMI.  Hospital Course: The patient is a 68 y/o male with h/o tobacco use and HTN but no prior cardiac history who presented to [facility] on 01/14/14 with complaints of intermittent chest pain, occuring at rest and radiating to his shoulders and jaw. ECG demonstrated sinus rhythm and inferior q waves and TWI (new). Initial troponin was 6.51. Subsequently, he was placed on IV heparin and IV nitro and was transferred to [facility] for further care. He was admitted to the ICU. On arrival, his BP was elevated at 180/84. He was started on a BB. A lipid panel revealed an elevated LDL at 145 mg/dL. Statin therapy was initiated. Cardiac enzymes continued to be cycled and peaked at 8.26. He underwent evaluation with a cardiac catheterization. The procedure was performed by Dr. Excell Seltzer. Access was obtained via the right radial artery. 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 He was also found to have moderate to severe stenosis of the first diagonal of the LAD with mild diffuse LAD stenosis for which medical therapy was recommended. He was noted noted to have mild to moderate segmental LV systolic dysfunction. EF was estimated at 45-50%. He left the cath lab in stable condition. He was continued on ASA, Coreg and Lipitor. Plavix, lisinopril and Imdur were also added. His Prilosec was discontinued and replaced by Protonix, in the  setting of Plavix therapy. He had no recurrent chest pain. No post cath complications. His right radial access site remained stable, free from hematoma. His BP remained stable. Smoking cessation was strongly advised. He was last seen and examined by Dr. Allyson Sabal who determined he was stable for discharge home. He is scheduled for 2 week post-hospital f/u with Robbie Lis, PA-C, on 01/29/14 and 6 week follow-up with Dr. Excell Seltzer on 03/01/14.    Consults: None  Significant Diagnostic Studies:  LHC 01/15/14 Procedural Findings:  Hemodynamics:  AO 93/48 with a mean of 67  LV 93/4  Coronary angiography:  Coronary dominance: right  Left mainstem: The left main is patent. There is no pressure dampening with the catheter well into the left main. There does appear to be mild diffuse proximal left main stem stenosis in the range of 30-40%.  Left anterior descending (LAD): The LAD is moderately calcified. The vessel has diffuse irregularity without any significant high-grade stenoses. There is 30-40% stenosis in the mid vessel. The LAD does reach the LV apex. The first diagonal branch is patent and its more superior subbranch has an 80% stenosis in an area where it divides into twin vessels.  Left circumflex (LCx): The left circumflex is patent. It is relatively small. It supplies 2 small OM branches with no significant disease noted.  Right coronary artery (RCA): This is a dominant vessel. The proximal and mid vessel are diffusely diseased with 40-50% stenosis present. The vessel is tortuous. It LA branch is widely patent. The PDA branch is small in caliber. This branch is subtotally occluded  with 99% stenosis in the proximal vessel. This is a very small caliber vessel that appears 1.5 mm or less in diameter.  Left ventriculography: The inferior wall is severely hypokinetic. The anterolateral and apical walls contract normally. The estimated LV EF is 45-50%.  Contrast: 80 cc  Radiation dose/Fluoro time: 4.3  minutes     Treatments: See Hospital Course  Discharge Exam: Blood pressure 145/72, pulse 73, temperature 98.4 F (36.9 C), temperature source Oral, resp. rate 21, height 5\' 9"  (1.753 m), weight 162 lb 0.6 oz (73.5 kg), SpO2 97.00%.   Disposition: 01-Home or Self Care      Discharge Instructions   Amb Referral to Cardiac Rehabilitation    Complete by:  As directed      Diet - low sodium heart healthy    Complete by:  As directed      Increase activity slowly    Complete by:  As directed             Medication List    STOP taking these medications       omeprazole 20 MG capsule  Commonly known as:  PRILOSEC  Replaced by:  pantoprazole 40 MG tablet      TAKE these medications       aspirin 81 MG EC tablet  Take 1 tablet (81 mg total) by mouth daily.     atorvastatin 80 MG tablet  Commonly known as:  LIPITOR  Take 1 tablet (80 mg total) by mouth daily at 6 PM.     carvedilol 6.25 MG tablet  Commonly known as:  COREG  Take 1 tablet (6.25 mg total) by mouth 2 (two) times daily with a meal.     clopidogrel 75 MG tablet  Commonly known as:  PLAVIX  Take 1 tablet (75 mg total) by mouth daily with breakfast.     isosorbide mononitrate 30 MG 24 hr tablet  Commonly known as:  IMDUR  Take 0.5 tablets (15 mg total) by mouth daily.     lisinopril 2.5 MG tablet  Commonly known as:  PRINIVIL,ZESTRIL  Take 1 tablet (2.5 mg total) by mouth daily.     multivitamin with minerals Tabs tablet  Take 1 tablet by mouth daily.     nitroGLYCERIN 0.4 MG SL tablet  Commonly known as:  NITROSTAT  Place 1 tablet (0.4 mg total) under the tongue every 5 (five) minutes x 3 doses as needed for chest pain.     omega-3 acid ethyl esters 1 G capsule  Commonly known as:  LOVAZA  Take 1 g by mouth 2 (two) times daily.     pantoprazole 40 MG tablet  Commonly known as:  PROTONIX  Take 1 tablet (40 mg total) by mouth daily.     sertraline 100 MG tablet  Commonly known as:  ZOLOFT    Take 50 mg by mouth daily.       Follow-up Information   Follow up with Robbie Lis, PA-C On 01/29/2014. (8:00 am (Dr. Earmon Phoenix PA))    Specialty:  Cardiology   Contact information:   [address] [place] [address] [phone]       Follow up with Tonny Bollman, MD On 03/01/2014. (2:15 pm )    Specialty:  Cardiology   Contact information:   [address] [place] [address] [phone]      TIME SPENT ON DISCHARGE, INCLUDING PHYSICIAN TIME: >30 MINUTES  Signed: Robbie Lis 01/16/2014, 2:09 PM

## 2014-01-29 ENCOUNTER — Encounter: Payer: Medicare Other | Admitting: Cardiology

## 2014-02-05 ENCOUNTER — Encounter: Payer: Self-pay | Admitting: Cardiovascular Disease

## 2014-02-05 DIAGNOSIS — I252 Old myocardial infarction: Secondary | ICD-10-CM | POA: Diagnosis not present

## 2014-02-05 DIAGNOSIS — I10 Essential (primary) hypertension: Secondary | ICD-10-CM | POA: Diagnosis not present

## 2014-02-05 DIAGNOSIS — E785 Hyperlipidemia, unspecified: Secondary | ICD-10-CM | POA: Diagnosis not present

## 2014-02-05 DIAGNOSIS — I2510 Atherosclerotic heart disease of native coronary artery without angina pectoris: Secondary | ICD-10-CM | POA: Diagnosis not present

## 2014-02-08 ENCOUNTER — Telehealth (HOSPITAL_COMMUNITY): Payer: Self-pay | Admitting: *Deleted

## 2014-02-08 NOTE — Telephone Encounter (Signed)
Received signed MD order.  Left message to please contact.  Phone number provided.  Next follow up 10/29.  May schedule after appt completed. Alanson Aly, BSN

## 2014-03-01 ENCOUNTER — Ambulatory Visit: Payer: Medicare Other | Admitting: Cardiovascular Disease

## 2014-03-13 DIAGNOSIS — I2510 Atherosclerotic heart disease of native coronary artery without angina pectoris: Secondary | ICD-10-CM | POA: Diagnosis not present

## 2014-03-13 DIAGNOSIS — E785 Hyperlipidemia, unspecified: Secondary | ICD-10-CM | POA: Diagnosis not present

## 2014-03-13 DIAGNOSIS — I10 Essential (primary) hypertension: Secondary | ICD-10-CM | POA: Diagnosis not present

## 2014-03-21 DIAGNOSIS — E780 Pure hypercholesterolemia: Secondary | ICD-10-CM | POA: Diagnosis not present

## 2014-03-21 DIAGNOSIS — I2510 Atherosclerotic heart disease of native coronary artery without angina pectoris: Secondary | ICD-10-CM | POA: Diagnosis not present

## 2014-03-21 DIAGNOSIS — F17209 Nicotine dependence, unspecified, with unspecified nicotine-induced disorders: Secondary | ICD-10-CM | POA: Diagnosis not present

## 2014-03-21 DIAGNOSIS — I10 Essential (primary) hypertension: Secondary | ICD-10-CM | POA: Diagnosis not present

## 2014-04-12 ENCOUNTER — Encounter (HOSPITAL_COMMUNITY): Payer: Self-pay | Admitting: Cardiovascular Disease

## 2014-05-11 DIAGNOSIS — I2510 Atherosclerotic heart disease of native coronary artery without angina pectoris: Secondary | ICD-10-CM | POA: Diagnosis not present

## 2014-05-11 DIAGNOSIS — R0789 Other chest pain: Secondary | ICD-10-CM | POA: Diagnosis not present

## 2014-05-11 DIAGNOSIS — I10 Essential (primary) hypertension: Secondary | ICD-10-CM | POA: Diagnosis not present

## 2014-06-07 DIAGNOSIS — E780 Pure hypercholesterolemia: Secondary | ICD-10-CM | POA: Diagnosis not present

## 2014-06-07 DIAGNOSIS — F17209 Nicotine dependence, unspecified, with unspecified nicotine-induced disorders: Secondary | ICD-10-CM | POA: Diagnosis not present

## 2014-06-07 DIAGNOSIS — I252 Old myocardial infarction: Secondary | ICD-10-CM | POA: Diagnosis not present

## 2014-06-07 DIAGNOSIS — I2510 Atherosclerotic heart disease of native coronary artery without angina pectoris: Secondary | ICD-10-CM | POA: Diagnosis not present

## 2014-07-13 DIAGNOSIS — I2510 Atherosclerotic heart disease of native coronary artery without angina pectoris: Secondary | ICD-10-CM | POA: Diagnosis not present

## 2014-07-13 DIAGNOSIS — E780 Pure hypercholesterolemia: Secondary | ICD-10-CM | POA: Diagnosis not present

## 2014-07-13 DIAGNOSIS — L270 Generalized skin eruption due to drugs and medicaments taken internally: Secondary | ICD-10-CM | POA: Diagnosis not present

## 2014-07-13 DIAGNOSIS — Z77098 Contact with and (suspected) exposure to other hazardous, chiefly nonmedicinal, chemicals: Secondary | ICD-10-CM | POA: Diagnosis not present

## 2014-08-10 DIAGNOSIS — E785 Hyperlipidemia, unspecified: Secondary | ICD-10-CM | POA: Diagnosis not present

## 2014-08-10 DIAGNOSIS — I2510 Atherosclerotic heart disease of native coronary artery without angina pectoris: Secondary | ICD-10-CM | POA: Diagnosis not present

## 2014-08-10 DIAGNOSIS — I10 Essential (primary) hypertension: Secondary | ICD-10-CM | POA: Diagnosis not present

## 2014-08-10 DIAGNOSIS — E780 Pure hypercholesterolemia: Secondary | ICD-10-CM | POA: Diagnosis not present

## 2014-11-12 DIAGNOSIS — I10 Essential (primary) hypertension: Secondary | ICD-10-CM | POA: Diagnosis not present

## 2014-11-12 DIAGNOSIS — E780 Pure hypercholesterolemia: Secondary | ICD-10-CM | POA: Diagnosis not present

## 2015-06-28 DIAGNOSIS — E785 Hyperlipidemia, unspecified: Secondary | ICD-10-CM | POA: Diagnosis not present

## 2015-06-28 DIAGNOSIS — I2510 Atherosclerotic heart disease of native coronary artery without angina pectoris: Secondary | ICD-10-CM | POA: Diagnosis not present

## 2015-06-28 DIAGNOSIS — E7800 Pure hypercholesterolemia, unspecified: Secondary | ICD-10-CM | POA: Diagnosis not present

## 2015-06-28 DIAGNOSIS — I10 Essential (primary) hypertension: Secondary | ICD-10-CM | POA: Diagnosis not present

## 2015-12-05 ENCOUNTER — Emergency Department (HOSPITAL_COMMUNITY)
Admission: EM | Admit: 2015-12-05 | Discharge: 2015-12-06 | Disposition: A | Discharge status: Home / Self Care | Payer: Non-veteran care | Attending: Emergency Medicine | Admitting: Emergency Medicine

## 2015-12-05 DIAGNOSIS — R1012 Left upper quadrant pain: Secondary | ICD-10-CM | POA: Diagnosis present

## 2015-12-05 DIAGNOSIS — I10 Essential (primary) hypertension: Secondary | ICD-10-CM | POA: Diagnosis not present

## 2015-12-05 DIAGNOSIS — N200 Calculus of kidney: Secondary | ICD-10-CM | POA: Diagnosis not present

## 2015-12-05 DIAGNOSIS — N1330 Unspecified hydronephrosis: Secondary | ICD-10-CM | POA: Diagnosis not present

## 2015-12-05 DIAGNOSIS — K5900 Constipation, unspecified: Secondary | ICD-10-CM | POA: Insufficient documentation

## 2015-12-05 DIAGNOSIS — Z7902 Long term (current) use of antithrombotics/antiplatelets: Secondary | ICD-10-CM | POA: Insufficient documentation

## 2015-12-05 DIAGNOSIS — Z7982 Long term (current) use of aspirin: Secondary | ICD-10-CM | POA: Insufficient documentation

## 2015-12-05 DIAGNOSIS — Z79899 Other long term (current) drug therapy: Secondary | ICD-10-CM | POA: Diagnosis not present

## 2015-12-05 DIAGNOSIS — N132 Hydronephrosis with renal and ureteral calculous obstruction: Secondary | ICD-10-CM | POA: Diagnosis not present

## 2015-12-05 DIAGNOSIS — F17210 Nicotine dependence, cigarettes, uncomplicated: Secondary | ICD-10-CM | POA: Diagnosis not present

## 2015-12-05 HISTORY — DX: Acute myocardial infarction, unspecified: I21.9

## 2015-12-06 ENCOUNTER — Encounter (HOSPITAL_COMMUNITY): Payer: Self-pay | Admitting: *Deleted

## 2015-12-06 ENCOUNTER — Emergency Department (HOSPITAL_COMMUNITY): Payer: Non-veteran care

## 2015-12-06 DIAGNOSIS — N132 Hydronephrosis with renal and ureteral calculous obstruction: Secondary | ICD-10-CM | POA: Diagnosis not present

## 2015-12-06 LAB — URINE MICROSCOPIC-ADD ON

## 2015-12-06 LAB — URINALYSIS, ROUTINE W REFLEX MICROSCOPIC
Bilirubin Urine: NEGATIVE
Glucose, UA: NEGATIVE mg/dL
KETONES UR: NEGATIVE mg/dL
LEUKOCYTES UA: NEGATIVE
Nitrite: NEGATIVE
PH: 6 (ref 5.0–8.0)
Protein, ur: NEGATIVE mg/dL
SPECIFIC GRAVITY, URINE: 1.012 (ref 1.005–1.030)

## 2015-12-06 LAB — CBC
HEMATOCRIT: 39.3 % (ref 39.0–52.0)
Hemoglobin: 14.1 g/dL (ref 13.0–17.0)
MCH: 32 pg (ref 26.0–34.0)
MCHC: 35.9 g/dL (ref 30.0–36.0)
MCV: 89.1 fL (ref 78.0–100.0)
Platelets: 236 10*3/uL (ref 150–400)
RBC: 4.41 MIL/uL (ref 4.22–5.81)
RDW: 12.3 % (ref 11.5–15.5)
WBC: 14.9 10*3/uL — AB (ref 4.0–10.5)

## 2015-12-06 LAB — COMPREHENSIVE METABOLIC PANEL
ALT: 27 U/L (ref 17–63)
AST: 37 U/L (ref 15–41)
Albumin: 4.8 g/dL (ref 3.5–5.0)
Alkaline Phosphatase: 56 U/L (ref 38–126)
Anion gap: 8 (ref 5–15)
BUN: 13 mg/dL (ref 6–20)
CHLORIDE: 98 mmol/L — AB (ref 101–111)
CO2: 23 mmol/L (ref 22–32)
Calcium: 8.9 mg/dL (ref 8.9–10.3)
Creatinine, Ser: 0.99 mg/dL (ref 0.61–1.24)
GFR calc Af Amer: >60 mL/min (ref >60)
GFR calc non Af Amer: >60 mL/min (ref >60)
GLUCOSE: 134 mg/dL — AB (ref 65–99)
POTASSIUM: 4.1 mmol/L (ref 3.5–5.1)
Sodium: 129 mmol/L — ABNORMAL LOW (ref 135–145)
Total Bilirubin: 1.1 mg/dL (ref 0.3–1.2)
Total Protein: 7.8 g/dL (ref 6.5–8.1)

## 2015-12-06 LAB — LIPASE, BLOOD: LIPASE: 29 U/L (ref 11–51)

## 2015-12-06 MED ORDER — OXYCODONE-ACETAMINOPHEN 5-325 MG PO TABS
1.0000 | ORAL_TABLET | Freq: Once | ORAL | Status: AC
  Administered 2015-12-06: 1 via ORAL
  Filled 2015-12-06: qty 1

## 2015-12-06 MED ORDER — OXYCODONE-ACETAMINOPHEN 5-325 MG PO TABS: 1.0000 | ORAL_TABLET | ORAL | 0 refills | Status: DC | PRN

## 2015-12-06 NOTE — ED Triage Notes (Signed)
Pt states that Tues he began to have LUQ / side pain; pt states that it was intermittent to begin with; pt states that the pain has gotten progressively worse and now is more constant; pt states that he feels like he has to strain to urinate and still doesn't feel like he completely empties his bladder; pt also states that he normally has 1-2 BM's per day but hasn't been since Wed am or Tues evening; pt denies N/V

## 2015-12-06 NOTE — ED Notes (Signed)
Bed: WA04 Expected date:  Expected time:  Means of arrival:  Comments: 

## 2015-12-06 NOTE — ED Provider Notes (Signed)
[facility]-EMERGENCY DEPT Provider Note   CSN: [id number] Arrival date & time: 12/05/15  2334  First Provider Contact:  First MD Initiated Contact with Patient 12/06/15 [phone]        History   Chief Complaint Chief Complaint  Patient presents with  . Abdominal Pain    HPI Ronald [name] is a 70 y.o. male.  Patient presents with complaint of left sided upper and lateral abdominal pain for the past 2 days. No fever. No nausea or vomiting. He describes waxing and waning pain. It sometimes radiates to the LLQ. He also has urinary symptoms of not feeling like he empties his bladder fully when he urinates. No difficulty starting a stream. His last bowel movement was 3-4 days ago which is a significant change in his usual bowel habit of daily movements.    The history is provided by the patient. No language interpreter was used.  Abdominal Pain   This is a new problem. The problem occurs constantly. The problem has not changed since onset.Associated symptoms include constipation. Pertinent negatives include fever, nausea, vomiting and myalgias.    Past Medical History:  Diagnosis Date  . Bronchitis   . Hypertension   . Myocardial infarction [facility])     Patient Active Problem List   Diagnosis Date Noted  . Tobacco abuse 01/16/2014  . NSTEMI (non-ST elevated myocardial infarction) (HCC) 01/14/2014  . Essential hypertension 01/14/2014    Past Surgical History:  Procedure Laterality Date  . LEFT HEART CATHETERIZATION WITH CORONARY ANGIOGRAM N/A 01/15/2014   Procedure: LEFT HEART CATHETERIZATION WITH CORONARY ANGIOGRAM;  Surgeon: Micheline Chapman, MD;  Location: [facility] CATH LAB;  Service: Cardiovascular;  Laterality: N/A;       Home Medications    Prior to Admission medications   Medication Sig Start Date End Date Taking? Authorizing Provider  aspirin EC 81 MG EC tablet Take 1 tablet (81 mg total) by mouth daily. 01/16/14  Yes Brittainy Sherlynn Carbon, PA-C  metoprolol tartrate (LOPRESSOR) 25  MG tablet Take 12.5 mg by mouth 2 (two) times daily.   Yes Historical Provider, MD  nitroGLYCERIN (NITROSTAT) 0.4 MG SL tablet Place 1 tablet (0.4 mg total) under the tongue every 5 (five) minutes x 3 doses as needed for chest pain. 01/16/14  Yes Brittainy Sherlynn Carbon, PA-C  omeprazole (PRILOSEC OTC) 20 MG tablet Take 20 mg by mouth daily.   Yes Historical Provider, MD  simvastatin (ZOCOR) 40 MG tablet Take 40 mg by mouth daily.   Yes Historical Provider, MD  valsartan (DIOVAN) 80 MG tablet Take 80 mg by mouth daily. 11/02/15  Yes Historical Provider, MD  atorvastatin (LIPITOR) 80 MG tablet Take 1 tablet (80 mg total) by mouth daily at 6 PM. Patient not taking: Reported on 12/06/2015 01/16/14   Brittainy Sherlynn Carbon, PA-C  carvedilol (COREG) 6.25 MG tablet Take 1 tablet (6.25 mg total) by mouth 2 (two) times daily with a meal. Patient not taking: Reported on 12/06/2015 01/16/14   Brittainy Sherlynn Carbon, PA-C  clopidogrel (PLAVIX) 75 MG tablet Take 1 tablet (75 mg total) by mouth daily with breakfast. Patient not taking: Reported on 12/06/2015 01/16/14   Brittainy M Sharol Harness, PA-C  isosorbide mononitrate (IMDUR) 30 MG 24 hr tablet Take 0.5 tablets (15 mg total) by mouth daily. Patient not taking: Reported on 12/06/2015 01/16/14   Brittainy M Sharol Harness, PA-C  lisinopril (PRINIVIL,ZESTRIL) 2.5 MG tablet Take 1 tablet (2.5 mg total) by mouth daily. Patient not taking: Reported on 12/06/2015 01/16/14  Brittainy Sherlynn Carbon, PA-C  pantoprazole (PROTONIX) 40 MG tablet Take 1 tablet (40 mg total) by mouth daily. Patient not taking: Reported on 12/06/2015 01/16/14   Brittainy Sherlynn Carbon, PA-C    Family History No family history on file.  Social History Social History  Substance Use Topics  . Smoking status: Current Every Day Smoker    Types: Cigars  . Smokeless tobacco: Never Used  . Alcohol use Yes     Allergies   Review of patient's allergies indicates no known allergies.   Review of Systems Review of Systems    Constitutional: Negative for chills and fever.  HENT: Negative.   Respiratory: Negative.   Cardiovascular: Negative.   Gastrointestinal: Positive for abdominal pain and constipation. Negative for abdominal distention, nausea and vomiting.  Genitourinary:       See HPI.  Musculoskeletal: Negative.  Negative for myalgias.  Skin: Negative.   Neurological: Negative.      Physical Exam Updated Vital Signs BP 198/88 (BP Location: Left Arm) Comment: RN,Emily made aware of pt. elevated blood pressure. pt. agitated because of the wait time in the ED.  Pulse 72   Temp 98.5 F (36.9 C) (Oral)   Resp 18   Ht  (1.727 m)   Wt 83.9 kg   SpO2 99%   BMI 28.13 kg/m   Physical Exam  Constitutional: He is oriented to person, place, and time. He appears well-developed and well-nourished.  HENT:  Head: Normocephalic.  Neck: Normal range of motion.  Pulmonary/Chest: Effort normal.  Abdominal: Soft. Bowel sounds are normal. He exhibits no distension and no mass. There is tenderness. There is no guarding.    Genitourinary:  Genitourinary Comments: No CVA tenderness.   Musculoskeletal: Normal range of motion.  Neurological: He is alert and oriented to person, place, and time.  Skin: Skin is warm and dry.  Psychiatric: He has a normal mood and affect.     ED Treatments / Results  Labs (all labs ordered are listed, but only abnormal results are displayed) Labs Reviewed  COMPREHENSIVE METABOLIC PANEL - Abnormal; Notable for the following:       Result Value   Sodium 129 (*)    Chloride 98 (*)    Glucose, Bld 134 (*)    All other components within normal limits  CBC - Abnormal; Notable for the following:    WBC 14.9 (*)    All other components within normal limits  URINALYSIS, ROUTINE W REFLEX MICROSCOPIC (NOT AT [facility]) - Abnormal; Notable for the following:    Hgb urine dipstick LARGE (*)    All other components within normal limits  URINE MICROSCOPIC-ADD ON - Abnormal; Notable  for the following:    Squamous Epithelial / LPF 0-5 (*)    Bacteria, UA FEW (*)    All other components within normal limits  LIPASE, BLOOD   Results for orders placed or performed during the hospital encounter of 12/05/15  Lipase, blood  Result Value Ref Range   Lipase 29 11 - 51 U/L  Comprehensive metabolic panel  Result Value Ref Range   Sodium 129 (L) 135 - 145 mmol/L   Potassium 4.1 3.5 - 5.1 mmol/L   Chloride 98 (L) 101 - 111 mmol/L   CO2 23 22 - 32 mmol/L   Glucose, Bld 134 (H) 65 - 99 mg/dL   BUN 13 6 - 20 mg/dL   Creatinine, Ser 8.65 0.61 - 1.24 mg/dL   Calcium 8.9 8.9 - 78.4 mg/dL  Total Protein 7.8 6.5 - 8.1 g/dL   Albumin 4.8 3.5 - 5.0 g/dL   AST 37 15 - 41 U/L   ALT 27 17 - 63 U/L   Alkaline Phosphatase 56 38 - 126 U/L   Total Bilirubin 1.1 0.3 - 1.2 mg/dL   GFR calc non Af Amer >60 >60 mL/min   GFR calc Af Amer >60 >60 mL/min   Anion gap 8 5 - 15  CBC  Result Value Ref Range   WBC 14.9 (H) 4.0 - 10.5 K/uL   RBC 4.41 4.22 - 5.81 MIL/uL   Hemoglobin 14.1 13.0 - 17.0 g/dL   HCT 76.7 20.9 - 47.0 %   MCV 89.1 78.0 - 100.0 fL   MCH 32.0 26.0 - 34.0 pg   MCHC 35.9 30.0 - 36.0 g/dL   RDW 96.2 83.6 - 62.9 %   Platelets 236 150 - 400 K/uL  Urinalysis, Routine w reflex microscopic  Result Value Ref Range   Color, Urine YELLOW YELLOW   APPearance CLEAR CLEAR   Specific Gravity, Urine 1.012 1.005 - 1.030   pH 6.0 5.0 - 8.0   Glucose, UA NEGATIVE NEGATIVE mg/dL   Hgb urine dipstick LARGE (A) NEGATIVE   Bilirubin Urine NEGATIVE NEGATIVE   Ketones, ur NEGATIVE NEGATIVE mg/dL   Protein, ur NEGATIVE NEGATIVE mg/dL   Nitrite NEGATIVE NEGATIVE   Leukocytes, UA NEGATIVE NEGATIVE  Urine microscopic-add on  Result Value Ref Range   Squamous Epithelial / LPF 0-5 (A) NONE SEEN   WBC, UA 6-30 0 - 5 WBC/hpf   RBC / HPF TOO NUMEROUS TO COUNT 0 - 5 RBC/hpf   Bacteria, UA FEW (A) NONE SEEN   Ct Renal Stone Study  Result Date: 12/06/2015 CLINICAL DATA:  Intermittent LEFT  abdominal pain becoming constant. Straining to urinate. Constipation. Leukocytosis and micro hematuria. History of hypertension. EXAM: CT ABDOMEN AND PELVIS WITHOUT CONTRAST TECHNIQUE: Multidetector CT imaging of the abdomen and pelvis was performed following the standard protocol without IV contrast. COMPARISON:  None. FINDINGS: LUNG BASES: Included view of the lung bases are clear. The visualized heart and pericardium are unremarkable. KIDNEYS/BLADDER: Kidneys are orthotopic, demonstrating normal size and morphology. Mild LEFT hydroureteronephrosis to the level ureterovesicular junction. 5 mm LEFT bladder calculus. 3 mm RIGHT upper pole, three 2 mm RIGHT lower pole, 3 mm LEFT lower pole, 2 mm LEFT interpolar, two 2 mm LEFT upper pole nephrolithiasis. Limited assessment for renal masses on this nonenhanced examination. 5 x 4.2 cm exophytic LEFT interpolar cyst. Urinary bladder is well distended with superimposed mild bladder wall thickening. SOLID ORGANS: The liver, spleen, gallbladder, pancreas and adrenal glands are unremarkable for this non-contrast examination. GASTROINTESTINAL TRACT: Small hiatal hernia. The stomach, small and large bowel are normal in course and caliber without inflammatory changes, the sensitivity may be decreased by lack of enteric contrast. Mild amount of retained large bowel stool. Severe sigmoid diverticulosis. Normal appendix. PERITONEUM/RETROPERITONEUM: Aortoiliac vessels are normal in course and caliber, severe calcific atherosclerosis. No lymphadenopathy by CT size criteria. Prostatomegaly. No intraperitoneal free fluid nor free air. SOFT TISSUES/ OSSEOUS STRUCTURES: Nonsuspicious. Moderate bilateral fat containing inguinal hernias. IMPRESSION: Mild LEFT hydroureteronephrosis, 5 mm LEFT bladder calculus is likely recently passed. Bilateral nephrolithiasis measure up to 3 mm. Mild urinary bladder wall thickening concerning for cystitis. Electronically Signed   By: Awilda Metro  M.D.   On: 12/06/2015 05:12     EKG  EKG Interpretation None       Radiology No results found.  Procedures Procedures (including critical care time)  Medications Ordered in ED Medications  oxyCODONE-acetaminophen (PERCOCET/ROXICET) 5-325 MG per tablet 1 tablet (not administered)     Initial Impression / Assessment and Plan / ED Course  I have reviewed the triage vital signs and the nursing notes.  Pertinent labs & imaging results that were available during my care of the patient were reviewed by me and considered in my medical decision making (see chart for details).  Clinical Course  Value Comment By Time  CT Renal [place] (Reviewed) Elpidio Anis, PA-C 08/07 [phone]    Patient presents with Left sided abdominal discomfort. He is found to have hematuria, no history of kidney stones. No fever, nausea or vomiting. CT scan performed to evaluate for stone and there is a 5mm stone seen, likely passed into the bladder. The patient's pain is controlled. He is felt stable for discharge home.   Final Clinical Impressions(s) / ED Diagnoses   Final diagnoses:  None   1. Left kidney stone New Prescriptions New Prescriptions   No medications on file     Elpidio Anis, PA-C 12/09/15 [id number]    Derwood Kaplan, MD 12/09/15 [phone]

## 2015-12-17 DIAGNOSIS — N202 Calculus of kidney with calculus of ureter: Secondary | ICD-10-CM | POA: Diagnosis not present

## 2017-03-24 DIAGNOSIS — I10 Essential (primary) hypertension: Secondary | ICD-10-CM | POA: Diagnosis not present

## 2017-03-24 DIAGNOSIS — Z136 Encounter for screening for cardiovascular disorders: Secondary | ICD-10-CM | POA: Diagnosis not present

## 2017-03-24 DIAGNOSIS — E785 Hyperlipidemia, unspecified: Secondary | ICD-10-CM | POA: Diagnosis not present

## 2017-03-24 DIAGNOSIS — I2510 Atherosclerotic heart disease of native coronary artery without angina pectoris: Secondary | ICD-10-CM | POA: Diagnosis not present

## 2017-04-06 DIAGNOSIS — I2510 Atherosclerotic heart disease of native coronary artery without angina pectoris: Secondary | ICD-10-CM | POA: Diagnosis not present

## 2017-04-06 DIAGNOSIS — E7800 Pure hypercholesterolemia, unspecified: Secondary | ICD-10-CM | POA: Diagnosis not present

## 2017-04-06 DIAGNOSIS — Z136 Encounter for screening for cardiovascular disorders: Secondary | ICD-10-CM | POA: Diagnosis not present

## 2017-04-06 DIAGNOSIS — E785 Hyperlipidemia, unspecified: Secondary | ICD-10-CM | POA: Diagnosis not present

## 2017-07-14 IMAGING — CT CT RENAL STONE PROTOCOL
2 of 3 series · 16 of 46 positions shown, 18 images · non-contrast
Comparison: None.

CLINICAL DATA: Intermittent LEFT abdominal pain becoming constant.
Straining to urinate. Constipation. Leukocytosis and micro
hematuria. History of hypertension.

EXAM:
CT ABDOMEN AND PELVIS WITHOUT CONTRAST
TECHNIQUE: Multidetector CT imaging of the abdomen and pelvis was performed
following the standard protocol without IV contrast.

[Series 3: lung · axial · 0.71mm/px · z∈[+1422,+1500]mm · 13 of 45 slices shown, 15 images]
[im 3/45  soft-tissue]
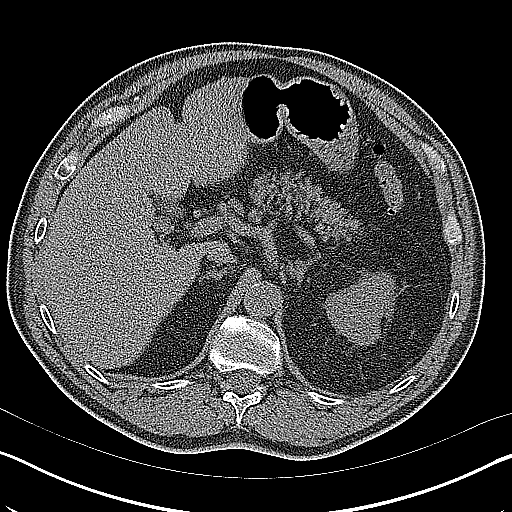
[im 3/45  bone]
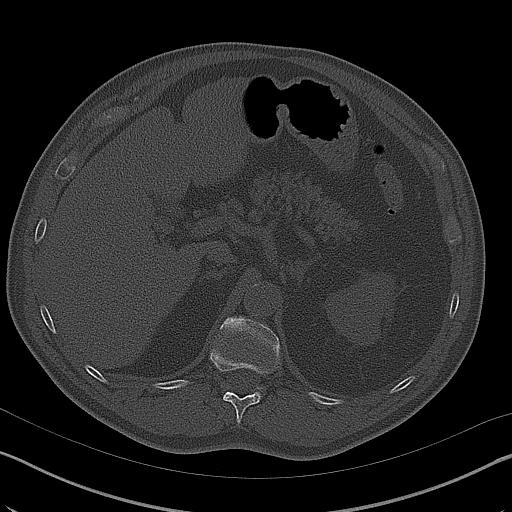
[im 6/45  soft-tissue]
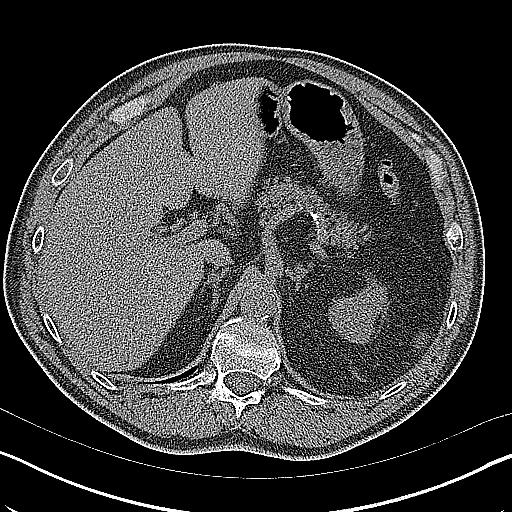
[im 9/45  soft-tissue]
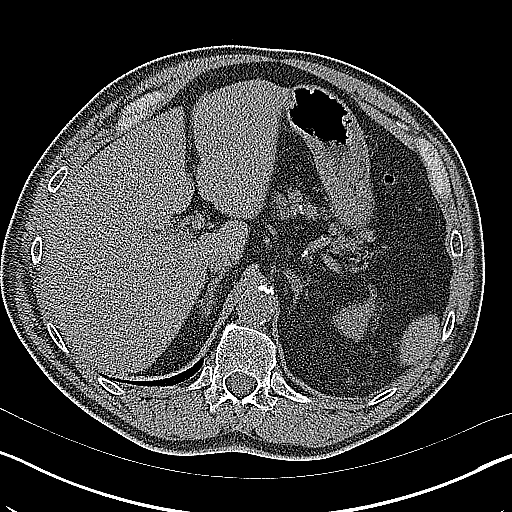
[im 13/45  soft-tissue]
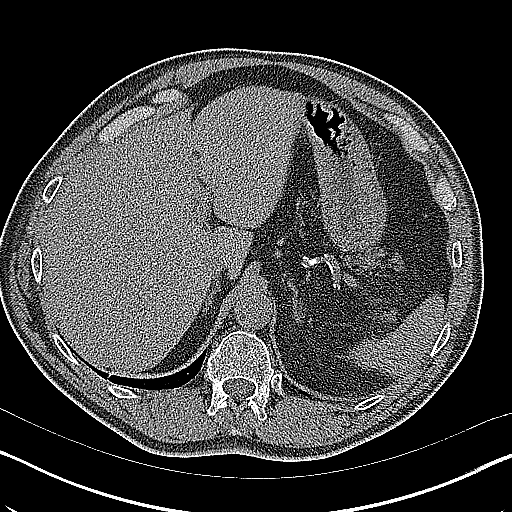
[im 16/45  soft-tissue]
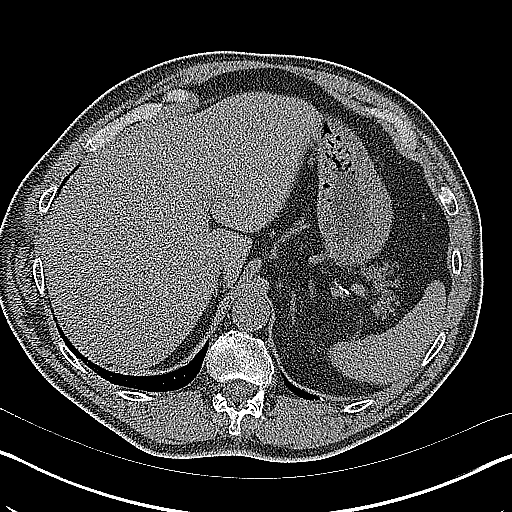
[im 19/45  soft-tissue]
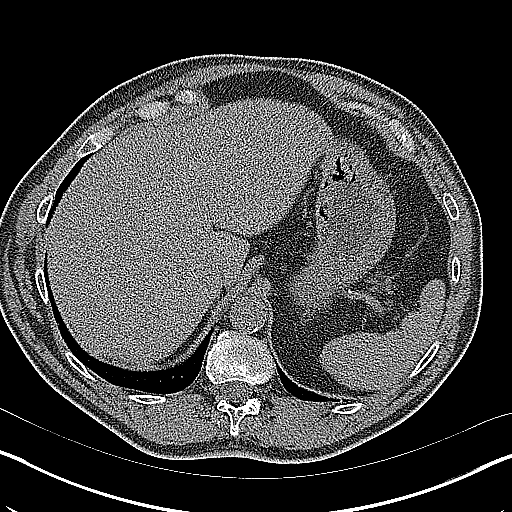
[im 23/45  soft-tissue]
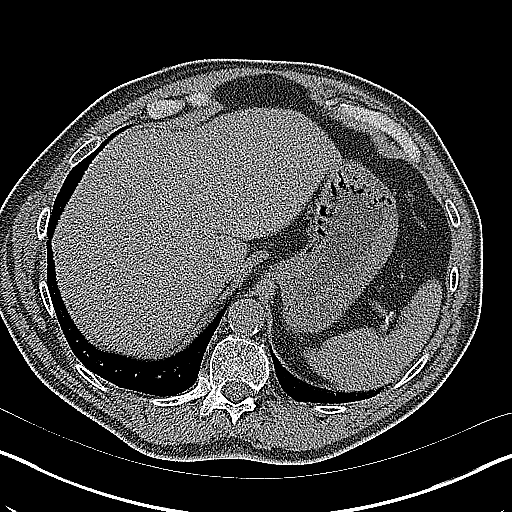
[im 26/45  soft-tissue]
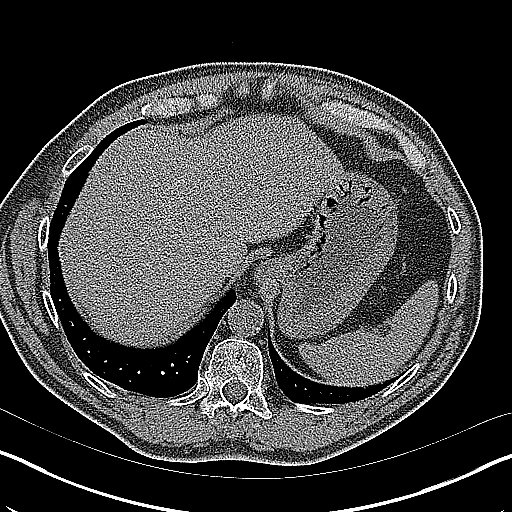
[im 29/45  soft-tissue]
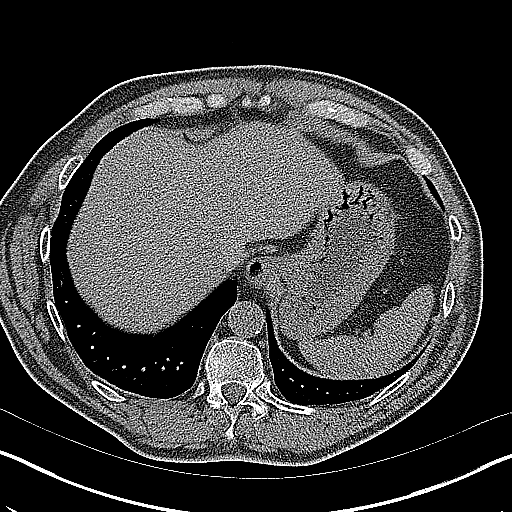
[im 29/45  bone]
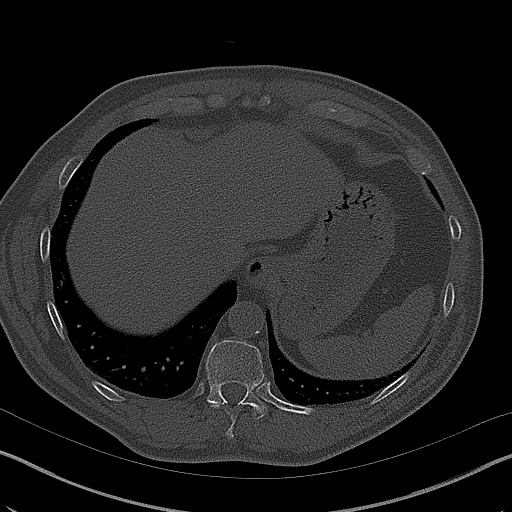
[im 32/45  soft-tissue]
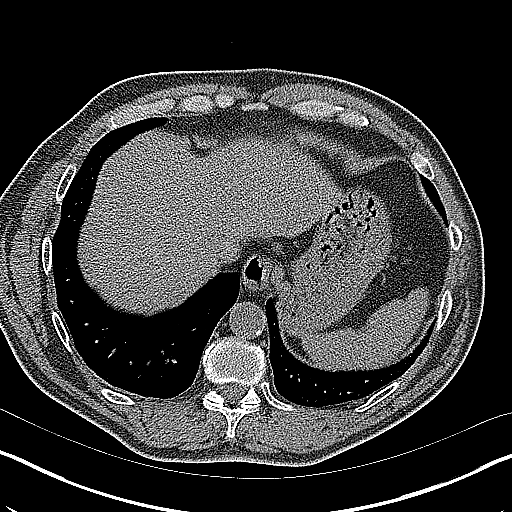
[im 36/45  soft-tissue]
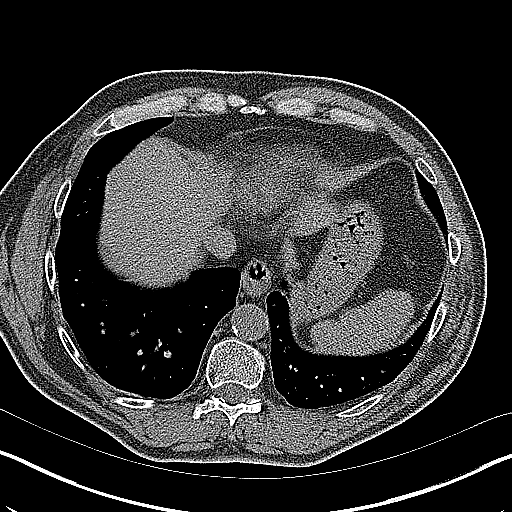
[im 39/45  soft-tissue]
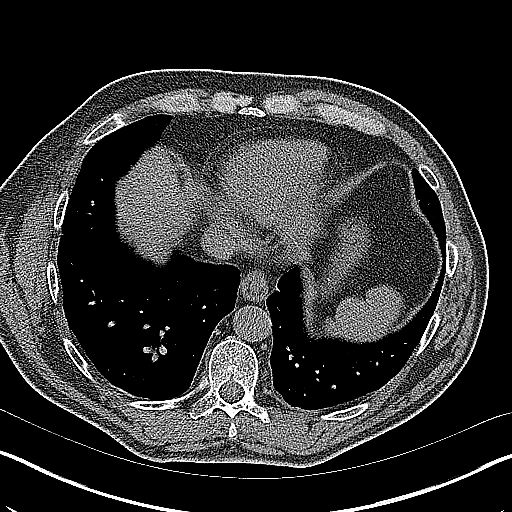
[im 42/45  soft-tissue]
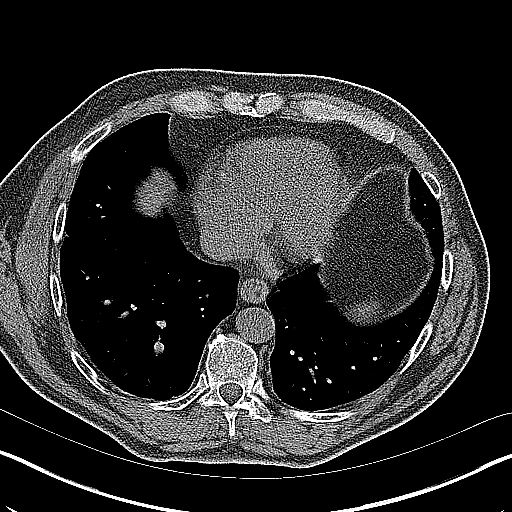

[Series 4: coronal · coronal · 0.66mm/px · 3 of 149 slices shown]
[im 50/149  soft-tissue]
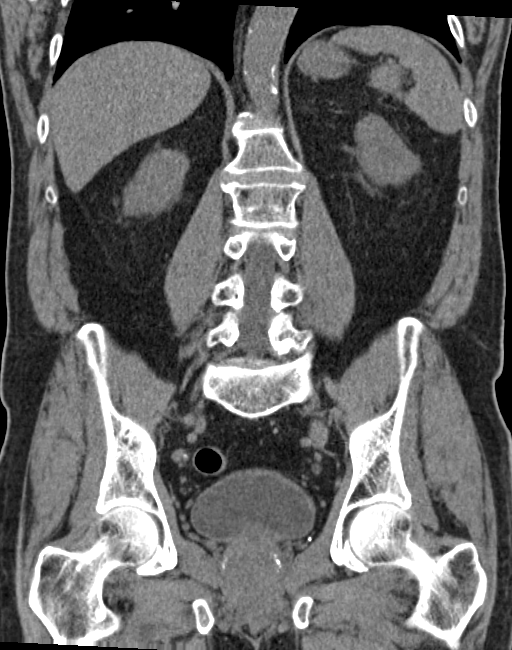
[im 66/149  soft-tissue]
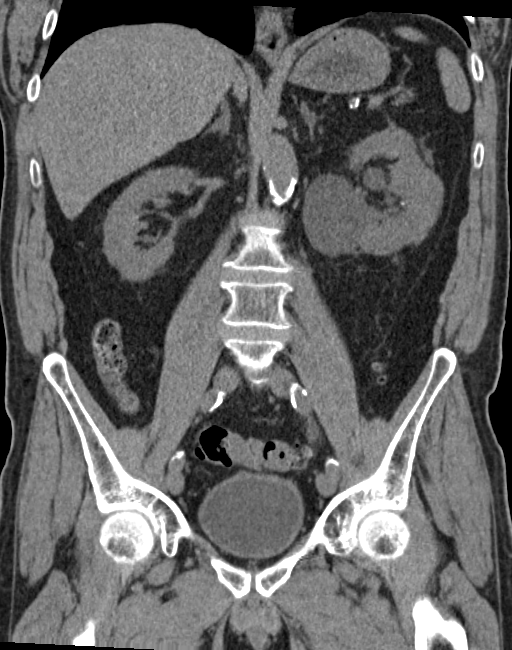
[im 83/149  soft-tissue]
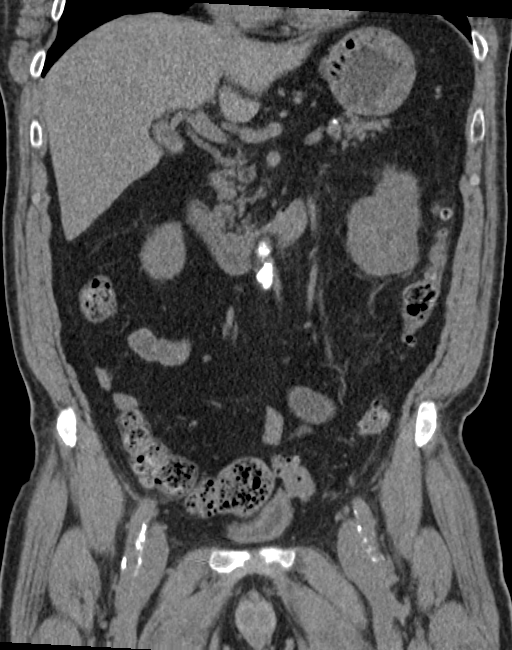

[16 of 46 positions shown; findings below may reference images not displayed]

FINDINGS: LUNG BASES: Included view of the lung bases are clear. The
visualized heart and pericardium are unremarkable.

KIDNEYS/BLADDER: Kidneys are orthotopic, demonstrating normal size
and morphology. Mild LEFT hydroureteronephrosis to the level
ureterovesicular junction. 5 mm LEFT bladder calculus. 3 mm RIGHT
upper pole, three 2 mm RIGHT lower pole, 3 mm LEFT lower pole, 2 mm
LEFT interpolar, two 2 mm LEFT upper pole nephrolithiasis. Limited
assessment for renal masses on this nonenhanced examination. 5 x
cm exophytic LEFT interpolar cyst. Urinary bladder is well distended
with superimposed mild bladder wall thickening.

SOLID ORGANS: The liver, spleen, gallbladder, pancreas and adrenal
glands are unremarkable for this non-contrast examination.

GASTROINTESTINAL TRACT: Small hiatal hernia. The stomach, small and
large bowel are normal in course and caliber without inflammatory
changes, the sensitivity may be decreased by lack of enteric
contrast. Mild amount of retained large bowel stool. Severe sigmoid
diverticulosis. Normal appendix.

PERITONEUM/RETROPERITONEUM: Aortoiliac vessels are normal in course
and caliber, severe calcific atherosclerosis. No lymphadenopathy by
CT size criteria. Prostatomegaly. No intraperitoneal free fluid nor
free air.

SOFT TISSUES/ OSSEOUS STRUCTURES: Nonsuspicious. Moderate bilateral
fat containing inguinal hernias.
IMPRESSION: Mild LEFT hydroureteronephrosis, 5 mm LEFT bladder calculus is
likely recently passed.

Bilateral nephrolithiasis measure up to 3 mm.

Mild urinary bladder wall thickening concerning for cystitis.

## 2018-03-24 DIAGNOSIS — E785 Hyperlipidemia, unspecified: Secondary | ICD-10-CM | POA: Diagnosis not present

## 2018-03-24 DIAGNOSIS — I10 Essential (primary) hypertension: Secondary | ICD-10-CM | POA: Diagnosis not present

## 2018-03-24 DIAGNOSIS — I25118 Atherosclerotic heart disease of native coronary artery with other forms of angina pectoris: Secondary | ICD-10-CM | POA: Diagnosis not present

## 2018-03-24 DIAGNOSIS — R0609 Other forms of dyspnea: Secondary | ICD-10-CM | POA: Diagnosis not present

## 2018-03-30 DIAGNOSIS — E785 Hyperlipidemia, unspecified: Secondary | ICD-10-CM | POA: Diagnosis not present

## 2018-03-30 DIAGNOSIS — R9431 Abnormal electrocardiogram [ECG] [EKG]: Secondary | ICD-10-CM | POA: Diagnosis not present

## 2018-03-30 DIAGNOSIS — I25118 Atherosclerotic heart disease of native coronary artery with other forms of angina pectoris: Secondary | ICD-10-CM | POA: Diagnosis not present

## 2018-03-30 DIAGNOSIS — R0602 Shortness of breath: Secondary | ICD-10-CM | POA: Diagnosis not present

## 2018-04-11 DIAGNOSIS — I25118 Atherosclerotic heart disease of native coronary artery with other forms of angina pectoris: Secondary | ICD-10-CM | POA: Diagnosis not present

## 2018-04-11 DIAGNOSIS — R0602 Shortness of breath: Secondary | ICD-10-CM | POA: Diagnosis not present

## 2018-04-11 DIAGNOSIS — R9431 Abnormal electrocardiogram [ECG] [EKG]: Secondary | ICD-10-CM | POA: Diagnosis not present

## 2018-04-29 DIAGNOSIS — E785 Hyperlipidemia, unspecified: Secondary | ICD-10-CM | POA: Diagnosis not present

## 2018-04-29 DIAGNOSIS — I10 Essential (primary) hypertension: Secondary | ICD-10-CM | POA: Diagnosis not present

## 2018-04-29 DIAGNOSIS — I25118 Atherosclerotic heart disease of native coronary artery with other forms of angina pectoris: Secondary | ICD-10-CM | POA: Diagnosis not present

## 2018-04-29 DIAGNOSIS — R0609 Other forms of dyspnea: Secondary | ICD-10-CM | POA: Diagnosis not present

## 2018-05-26 ENCOUNTER — Institutional Professional Consult (permissible substitution): Payer: Non-veteran care | Admitting: Pulmonary Disease

## 2018-06-13 ENCOUNTER — Institutional Professional Consult (permissible substitution): Payer: Non-veteran care | Admitting: Pulmonary Disease

## 2018-10-24 ENCOUNTER — Ambulatory Visit: Payer: Non-veteran care | Admitting: Cardiology

## 2018-11-11 ENCOUNTER — Other Ambulatory Visit: Payer: Self-pay

## 2018-11-11 DIAGNOSIS — I214 Non-ST elevation (NSTEMI) myocardial infarction: Secondary | ICD-10-CM

## 2018-11-11 MED ORDER — METOPROLOL TARTRATE 25 MG PO TABS: 12.5000 mg | ORAL_TABLET | Freq: Two times a day (BID) | ORAL | 3 refills | Status: DC

## 2018-11-28 ENCOUNTER — Ambulatory Visit: Payer: Non-veteran care | Admitting: Cardiology

## 2019-01-06 ENCOUNTER — Ambulatory Visit: Payer: Non-veteran care | Admitting: Cardiology

## 2019-02-10 ENCOUNTER — Other Ambulatory Visit: Payer: Self-pay

## 2019-02-10 ENCOUNTER — Ambulatory Visit (INDEPENDENT_AMBULATORY_CARE_PROVIDER_SITE_OTHER): Payer: Medicare Other | Admitting: Cardiology

## 2019-02-10 ENCOUNTER — Encounter: Payer: Self-pay | Admitting: Cardiology

## 2019-02-10 VITALS — BP 133/69 | HR 60 | Temp 96.9°F | Ht 68.0 in | Wt 169.9 lb

## 2019-02-10 DIAGNOSIS — E782 Mixed hyperlipidemia: Secondary | ICD-10-CM | POA: Diagnosis not present

## 2019-02-10 DIAGNOSIS — R0609 Other forms of dyspnea: Secondary | ICD-10-CM

## 2019-02-10 DIAGNOSIS — R0600 Dyspnea, unspecified: Secondary | ICD-10-CM

## 2019-02-10 DIAGNOSIS — I2510 Atherosclerotic heart disease of native coronary artery without angina pectoris: Secondary | ICD-10-CM | POA: Diagnosis not present

## 2019-02-10 DIAGNOSIS — I10 Essential (primary) hypertension: Secondary | ICD-10-CM | POA: Diagnosis not present

## 2019-02-10 NOTE — Progress Notes (Signed)
Primary Physician:  Patient, No Pcp Per   Patient ID: Ronald [name], male    DOB: 1945-12-19, 73 y.o.   MRN: [id number]  Subjective:    Chief Complaint  Patient presents with  . Coronary Artery Disease  . Follow-up    6 month    HPI: Ronald [name]  is a 73 y.o. male  with HTN, Hyperlipidemia, admitted on 01/14/2014 with non-ST elevation myocardial infarction. Angiogram revealed subtotally occluded but diffusely diseased PDA branch of the right coronary artery which was left alone, moderate diffuse RCA disease and high grade D1 disease. He is here on 6 month follow up.    He had noted worsening dyspnea 6 months-1 year ago, underwent echocardiogram in Dec 2019 revealing normal LVEF, grade 1 diastolic dysfunction. Lexiscan nuclear stress test in Nov 2019 was considered intermediate risk study. Dyspnea was felt to be contributed by underlying pulmonary issues, and if he had worsening symptoms, would consider coronary angiogram.  He continues to have dyspnea with over exertion that he states is stable. He is not overly bothered by it. No chest pain. Unfortunatley, he continues to smoke about 5 cigars a day. Tolerating all medications well. No claudication or TIA, no change in weight.   Past Medical History:  Diagnosis Date  . Bronchitis   . Hypertension   . Myocardial infarction [facility])     Past Surgical History:  Procedure Laterality Date  . LEFT HEART CATHETERIZATION WITH CORONARY ANGIOGRAM N/A 01/15/2014   Procedure: LEFT HEART CATHETERIZATION WITH CORONARY ANGIOGRAM;  Surgeon: Micheline Chapman, MD;  Location: [facility] CATH LAB;  Service: Cardiovascular;  Laterality: N/A;    Social History   Socioeconomic History  . Marital status: Married    Spouse name: Not on file  . Number of children: 1  . Years of education: Not on file  . Highest education level: Not on file  Occupational History  . Not on file  Social Needs  . Financial resource strain: Not on file  . Food  insecurity    Worry: Not on file    Inability: Not on file  . Transportation needs    Medical: Not on file    Non-medical: Not on file  Tobacco Use  . Smoking status: Current Every Day Smoker    Types: Cigars  . Smokeless tobacco: Never Used  Substance and Sexual Activity  . Alcohol use: Yes    Comment: beer occ  . Drug use: No  . Sexual activity: Not on file  Lifestyle  . Physical activity    Days per week: Not on file    Minutes per session: Not on file  . Stress: Not on file  Relationships  . Social Musician on phone: Not on file    Gets together: Not on file    Attends religious service: Not on file    Active member of club or organization: Not on file    Attends meetings of clubs or organizations: Not on file    Relationship status: Not on file  . Intimate partner violence    Fear of current or ex partner: Not on file    Emotionally abused: Not on file    Physically abused: Not on file    Forced sexual activity: Not on file  Other Topics Concern  . 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  Eyes: Negative for visual disturbance.  Cardiovascular: Negative for chest pain, claudication, dyspnea on exertion, leg swelling, orthopnea, palpitations and syncope.  Respiratory: Negative for hemoptysis and wheezing.   Endocrine: Negative for cold intolerance and heat intolerance.  Hematologic/Lymphatic: Does not bruise/bleed easily.  Skin: Negative for nail changes.  Musculoskeletal: Negative for muscle weakness and myalgias.  Gastrointestinal: Negative for abdominal pain, change in bowel habit, nausea and vomiting.  Neurological: Negative for difficulty with concentration, dizziness, focal weakness and headaches.  Psychiatric/Behavioral: Negative for altered mental status and suicidal ideas.  All other systems reviewed  and are negative.     Objective:  Blood pressure 133/69, pulse 60, temperature (!) 96.9 F (36.1 C), height 5\' 8"  (1.727 m), weight 169 lb 14.4 oz (77.1 kg), SpO2 96 %. Body mass index is 25.83 kg/m.    Physical Exam  Constitutional: He is oriented to person, place, and time. Vital signs are normal. He appears well-developed and well-nourished.  HENT:  Head: Normocephalic and atraumatic.  Neck: Normal range of motion.  Cardiovascular: Normal rate, regular rhythm, normal heart sounds and intact distal pulses.  Pulmonary/Chest: Effort normal and breath sounds normal. No accessory muscle usage. No respiratory distress.  Abdominal: Soft. Bowel sounds are normal.  Musculoskeletal: Normal range of motion.  Neurological: He is alert and oriented to person, place, and time.  Skin: Skin is warm and dry.  Vitals reviewed.  Radiology: No results found.  Laboratory examination:    CMP Latest Ref Rng & Units 02/10/2019 12/06/2015 01/15/2014  Glucose 65 - 99 mg/dL 161(W101(H) 960(A134(H) 540(J112(H)  BUN 8 - 27 mg/dL 14 13 11   Creatinine 0.76 - 1.27 mg/dL 8.111.02 9.140.99 7.820.90  Sodium 134 - 144 mmol/L 134 129(L) 134(L)  Potassium 3.5 - 5.2 mmol/L 4.8 4.1 4.1  Chloride 96 - 106 mmol/L 101 98(L) 96  CO2 20 - 29 mmol/L 21 23 26   Calcium 8.6 - 10.2 mg/dL 9.1 8.9 9.1  Total Protein 6.0 - 8.5 g/dL 6.6 7.8 -  Total Bilirubin 0.0 - 1.2 mg/dL 0.5 1.1 -  Alkaline Phos 39 - 117 IU/L 57 56 -  AST 0 - 40 IU/L 20 37 -  ALT 0 - 44 IU/L 21 27 -   CBC Latest Ref Rng & Units 02/10/2019 12/06/2015 01/16/2014  WBC 3.4 - 10.8 x10E3/uL 6.6 14.9(H) 7.9  Hemoglobin 13.0 - 17.7 g/dL 12.7(L) 14.1 12.2(L)  Hematocrit 37.5 - 51.0 % 37.2(L) 39.3 35.7(L)  Platelets 150 - 450 x10E3/uL 242 236 207   Lipid Panel     Component Value Date/Time   CHOL 148 02/10/2019 1003   TRIG 65 02/10/2019 1003   HDL 62 02/10/2019 1003   CHOLHDL 2.4 02/10/2019 1003   CHOLHDL 3.5 01/15/2014 0336   VLDL 20 01/15/2014 0336   LDLCALC 73 02/10/2019 1003    HEMOGLOBIN A1C Lab Results  Component Value Date   HGBA1C 5.6 01/14/2014   MPG 114 01/14/2014   TSH No results for input(s): TSH in the last 8760 hours.  PRN Meds:. Medications Discontinued During This Encounter  Medication Reason  . isosorbide mononitrate (IMDUR) 30 MG 24 hr tablet Change in therapy  . oxyCODONE-acetaminophen (PERCOCET/ROXICET) 5-325 MG tablet No longer needed (for PRN medications)  . pantoprazole (PROTONIX) 40 MG tablet Change in therapy   Current Meds  Medication Sig  . aspirin EC 81 MG EC tablet Take 1 tablet (81 mg total) by mouth daily.  Marland Kitchen. atorvastatin (LIPITOR) 80 MG tablet Take 1  tablet (80 mg total) by mouth daily at 6 PM.  . carvedilol (COREG) 6.25 MG tablet Take 1 tablet (6.25 mg total) by mouth 2 (two) times daily with a meal.  . clopidogrel (PLAVIX) 75 MG tablet Take 1 tablet (75 mg total) by mouth daily with breakfast.  . metoprolol tartrate (LOPRESSOR) 25 MG tablet Take 0.5 tablets (12.5 mg total) by mouth 2 (two) times daily. (Patient taking differently: Take 25 mg by mouth 2 (two) times daily. )  . nitroGLYCERIN (NITROSTAT) 0.4 MG SL tablet Place 1 tablet (0.4 mg total) under the tongue every 5 (five) minutes x 3 doses as needed for chest pain.  Marland Kitchen omeprazole (PRILOSEC OTC) 20 MG tablet Take 20 mg by mouth daily.    Cardiac Studies:   Echocardiogram 04/13/2018: 1. Left ventricle cavity is normal in size. Mild concentric hypertrophy of the left ventricle. Normal global wall motion. Visual EF is 60-65%. Doppler evidence of grade I (impaired) diastolic dysfunction, elevated LAP. 2. Trileaflet aortic valve. Mild calcification of the aortic valve annulus. Mild aortic valve leaflet calcification. 3. Mild (Grade I) mitral regurgitation. Mild calcification of the mitral valve annulus. 4. Mild tricuspid regurgitation. No evidence of pulmonary hypertension  Lexiscan myoview stress test 03/30/2018: 1. Lexiscan stress test was performed. Exercise capacity was  not assessed. Stress symptoms included dyspnea. Peak effect blood pressure was 174/64 mmHg. Stress EKG is non diagnostic for ischemia as it is a pharmacologic stress. In addition, stress electrocardiogram showed sinus tachycardia, incomplete RBBB, no stress arrhythmias and normal stress repolarization. 2. The overall quality of the study is fair. Left ventricular cavity is noted to be normal on the rest and stress studies. Gated SPECT images reveal normal myocardial thickening and wall motion. The left ventricular ejection fraction was calculated 41%, although visually appears normal. Small sized, mild intensity, reversible perfusion defect in inferior apical myocardium likely represents ischemia. In addition, there is basal inferior myocardium defect seen worse on rest images, which likely represents tissue attenuation artifact. Recommend clinical correlation of ejection fraction with echocardiogram. 3. Intermediate risk study.  Abdominal aortic duplex 04/06/2017: Normal abdominal aorta. No evidence of aneurysm.  Coronary angiogram 01/15/2014: Subtotally occluded PDA, mild diffuse disease in the right coronary artery. Moderate disease in the proximal LAD - 40% and first diagonal branch large with 80% to 90% stenosis in mid segment. LVEF 45-50% with inferior severe hypokinesis. No significant mitral regurgitation. Diffuse coronary calcification.  Assessment:   Coronary artery disease involving native coronary artery of native heart without angina pectoris - Plan: EKG 12-Lead, Lipid Profile, CBC, Comprehensive Metabolic Panel (CMET)  Essential hypertension  Mixed hyperlipidemia  Dyspnea on exertion  EKG 02/10/2019: Normal sinus rhythm at 61 bpm, left atrial enlargement, PRWP cannot exclude anterior infarct old. Diffuse nonspecific T wave abnormality.   Recommendations:   Patient is here for 32-month office visit and follow-up, he is presently doing well with stable dyspnea on exertion that has  not worsened since last seen by [place].  He had previously underwent nuclear stress testing 6 months ago that showed very small sized inferior ischemia, normal LVEF by echocardiogram.  As his symptoms have remained stable, will continue with watchful waiting and close monitoring of his symptoms.  He is on appropriate medical therapy.  Blood pressure is well controlled.  He is followed by the [facility] and has not had recent labs.  Will obtain lipids, CBC, and CMP for surveillance.  We will plan to see him back in 6 months, but encouraged him to  contact us sooner if needed.  Miquel Dunn, MSN, APRN, FNP-C [facility]. [place] Office: [phone] Fax: [phone]

## 2019-02-11 LAB — CBC
Hematocrit: 37.2 % — ABNORMAL LOW (ref 37.5–51.0)
Hemoglobin: 12.7 g/dL — ABNORMAL LOW (ref 13.0–17.7)
MCH: 32.5 pg (ref 26.6–33.0)
MCHC: 34.1 g/dL (ref 31.5–35.7)
MCV: 95 fL (ref 79–97)
Platelets: 242 10*3/uL (ref 150–450)
RBC: 3.91 x10E6/uL — ABNORMAL LOW (ref 4.14–5.80)
RDW: 12.7 % (ref 11.6–15.4)
WBC: 6.6 10*3/uL (ref 3.4–10.8)

## 2019-02-11 LAB — LIPID PANEL
Chol/HDL Ratio: 2.4 ratio (ref 0.0–5.0)
Cholesterol, Total: 148 mg/dL (ref 100–199)
HDL: 62 mg/dL (ref >39)
LDL Chol Calc (NIH): 73 mg/dL (ref 0–99)
Triglycerides: 65 mg/dL (ref 0–149)
VLDL Cholesterol Cal: 13 mg/dL (ref 5–40)

## 2019-02-11 LAB — COMPREHENSIVE METABOLIC PANEL
ALT: 21 IU/L (ref 0–44)
AST: 20 IU/L (ref 0–40)
Albumin/Globulin Ratio: 2.5 — ABNORMAL HIGH (ref 1.2–2.2)
Albumin: 4.7 g/dL (ref 3.7–4.7)
Alkaline Phosphatase: 57 IU/L (ref 39–117)
BUN/Creatinine Ratio: 14 (ref 10–24)
BUN: 14 mg/dL (ref 8–27)
Bilirubin Total: 0.5 mg/dL (ref 0.0–1.2)
CO2: 21 mmol/L (ref 20–29)
Calcium: 9.1 mg/dL (ref 8.6–10.2)
Chloride: 101 mmol/L (ref 96–106)
Creatinine, Ser: 1.02 mg/dL (ref 0.76–1.27)
GFR calc Af Amer: 84 mL/min/{1.73_m2} (ref >59)
GFR calc non Af Amer: 73 mL/min/{1.73_m2} (ref >59)
Globulin, Total: 1.9 g/dL (ref 1.5–4.5)
Glucose: 101 mg/dL — ABNORMAL HIGH (ref 65–99)
Potassium: 4.8 mmol/L (ref 3.5–5.2)
Sodium: 134 mmol/L (ref 134–144)
Total Protein: 6.6 g/dL (ref 6.0–8.5)

## 2019-02-12 ENCOUNTER — Encounter: Payer: Self-pay | Admitting: Cardiology

## 2019-03-07 ENCOUNTER — Telehealth: Payer: Self-pay

## 2019-05-17 ENCOUNTER — Other Ambulatory Visit: Payer: Self-pay

## 2019-05-17 DIAGNOSIS — I214 Non-ST elevation (NSTEMI) myocardial infarction: Secondary | ICD-10-CM

## 2019-05-17 MED ORDER — CLOPIDOGREL BISULFATE 75 MG PO TABS: 75.0000 mg | ORAL_TABLET | Freq: Every day | ORAL | 3 refills | Status: DC

## 2019-05-17 MED ORDER — CARVEDILOL 6.25 MG PO TABS: 6.2500 mg | ORAL_TABLET | Freq: Two times a day (BID) | ORAL | 3 refills | Status: DC

## 2019-05-17 MED ORDER — METOPROLOL TARTRATE 25 MG PO TABS: 12.5000 mg | ORAL_TABLET | Freq: Two times a day (BID) | ORAL | 3 refills | Status: DC

## 2019-05-17 MED ORDER — VALSARTAN 80 MG PO TABS: 80.0000 mg | ORAL_TABLET | Freq: Every day | ORAL | 3 refills | Status: DC

## 2019-05-17 MED ORDER — LISINOPRIL 2.5 MG PO TABS: 2.5000 mg | ORAL_TABLET | Freq: Every day | ORAL | 3 refills | Status: DC

## 2019-05-17 MED ORDER — SIMVASTATIN 40 MG PO TABS: 40.0000 mg | ORAL_TABLET | Freq: Every day | ORAL | 3 refills | Status: DC

## 2019-05-17 MED ORDER — ATORVASTATIN CALCIUM 80 MG PO TABS: 80.0000 mg | ORAL_TABLET | Freq: Every day | ORAL | 3 refills | Status: DC

## 2019-05-17 MED ORDER — NITROGLYCERIN 0.4 MG SL SUBL: 0.4000 mg | SUBLINGUAL_TABLET | SUBLINGUAL | 2 refills | Status: DC | PRN

## 2019-07-26 ENCOUNTER — Other Ambulatory Visit: Payer: Self-pay

## 2019-07-26 ENCOUNTER — Telehealth: Payer: Self-pay

## 2019-07-26 DIAGNOSIS — I214 Non-ST elevation (NSTEMI) myocardial infarction: Secondary | ICD-10-CM

## 2019-07-26 MED ORDER — VALSARTAN 80 MG PO TABS: 80.0000 mg | ORAL_TABLET | Freq: Every day | ORAL | 3 refills | Status: DC

## 2019-07-26 MED ORDER — SIMVASTATIN 40 MG PO TABS: 40.0000 mg | ORAL_TABLET | Freq: Every day | ORAL | 3 refills | Status: DC

## 2019-07-26 MED ORDER — LOSARTAN POTASSIUM 100 MG PO TABS: 100.0000 mg | ORAL_TABLET | Freq: Every day | ORAL | 3 refills | Status: DC

## 2019-07-26 MED ORDER — ASPIRIN 81 MG PO TBEC: 81.0000 mg | DELAYED_RELEASE_TABLET | Freq: Every day | ORAL | 3 refills | Status: DC

## 2019-07-26 MED ORDER — OMEPRAZOLE MAGNESIUM 20 MG PO TBEC: 20.0000 mg | DELAYED_RELEASE_TABLET | Freq: Every day | ORAL | 3 refills | Status: DC

## 2019-07-26 MED ORDER — METOPROLOL TARTRATE 25 MG PO TABS
12.5000 mg | ORAL_TABLET | Freq: Two times a day (BID) | ORAL | 3 refills | Status: DC
Start: 2019-07-26 — End: 2019-07-27

## 2019-07-26 MED ORDER — NITROGLYCERIN 0.4 MG SL SUBL: 0.4000 mg | SUBLINGUAL_TABLET | SUBLINGUAL | 2 refills | Status: DC | PRN

## 2019-07-26 NOTE — Telephone Encounter (Signed)
FYI, Per anabell pt would like a copy of his medicine list, I am going to print this out and give it to his wife.

## 2019-07-27 ENCOUNTER — Other Ambulatory Visit: Payer: Self-pay

## 2019-07-27 ENCOUNTER — Telehealth: Payer: Self-pay

## 2019-07-27 ENCOUNTER — Other Ambulatory Visit: Payer: Self-pay | Admitting: Cardiology

## 2019-07-27 DIAGNOSIS — I214 Non-ST elevation (NSTEMI) myocardial infarction: Secondary | ICD-10-CM

## 2019-07-27 MED ORDER — ASPIRIN 81 MG PO TBEC: 81.0000 mg | DELAYED_RELEASE_TABLET | Freq: Every day | ORAL | 3 refills | Status: DC

## 2019-07-27 NOTE — Telephone Encounter (Signed)
I called patient to go over medications with him he was driving . He will call me back when he gets home and has medication in front of him.

## 2019-07-27 NOTE — Telephone Encounter (Signed)
Spoke with patient and confirmed medications. He will come by and pick up updated list. List put up front for patient.

## 2019-07-27 NOTE — Telephone Encounter (Signed)
Updated medication with patient. List put up front for patient to pickup.

## 2019-08-11 ENCOUNTER — Ambulatory Visit: Payer: Non-veteran care | Admitting: Cardiology

## 2019-09-14 NOTE — Progress Notes (Signed)
Primary Physician/Referring:  Patient, No Pcp Per  Patient ID: Ronald [name], male    DOB: 03-14-1946, 74 y.o.   MRN: [id number]  Chief Complaint  Patient presents with  . Coronary Artery Disease  . DOE  . Follow-up    6 month   HPI:    Ronald [name]  is a 74 y.o. male  with HTN, Hyperlipidemia, admitted on 01/14/2014 with non-ST elevation myocardial infarction. Angiogram revealed subtotally occluded but diffusely diseased PDA branch of the right coronary artery which was left alone, moderate diffuse RCA disease and high grade D1 disease. He is here on 6 month follow up.    Echocardiogram in Dec 2019 revealing normal LVEF, grade 1 diastolic dysfunction. Lexiscan nuclear stress test in Nov 2019 was considered intermediate risk study. Dyspnea was felt to be contributed by underlying pulmonary issues, and if he had worsening symptoms, would consider coronary angiogram.  This is a 43-month office visit, denies chest pain, he has lost about 10 pounds in weight with diet and states that the dyspnea is improved.  He has noticed elevated blood pressure.  He has been doing heavy exertional work trying to move into a new house without any significant limitations.  Past Medical History:  Diagnosis Date  . Bronchitis   . Hypertension   . Myocardial infarction [facility])    Past Surgical History:  Procedure Laterality Date  . LEFT HEART CATHETERIZATION WITH CORONARY ANGIOGRAM N/A 01/15/2014   Procedure: LEFT HEART CATHETERIZATION WITH CORONARY ANGIOGRAM;  Surgeon: Micheline Chapman, MD;  Location: [facility] CATH LAB;  Service: Cardiovascular;  Laterality: N/A;   History reviewed. No pertinent family history.  Social History   Tobacco Use  . Smoking status: Current Every Day Smoker    Types: Cigars  . Smokeless tobacco: Never Used  Substance Use Topics  . Alcohol use: Yes    Comment: beer occ   Marital Status: Married  ROS  Review of Systems  Constitution: Negative for weight gain.   Cardiovascular: Negative for dyspnea on exertion, leg swelling and syncope.  Respiratory: Negative for shortness of breath.   Musculoskeletal: Positive for arthritis. Negative for joint swelling.   Objective  Blood pressure (!) 147/69, pulse 65, temperature 97.8 F (36.6 C), temperature source Temporal, resp. rate 16, height 5\' 8"  (1.727 m), weight 164 lb (74.4 kg), SpO2 97 %.  Vitals with BMI 09/15/2019 02/10/2019 12/06/2015  Height 5\' 8"  5\' 8"  -  Weight 164 lbs 169 lbs 14 oz -  BMI 24.94 25.84 -  Systolic 147 133 02/05/2016  Diastolic 69 69 88  Pulse 65 60 72     Physical Exam  Constitutional: He appears well-developed and well-nourished. No distress.  Cardiovascular: Normal rate, regular rhythm and intact distal pulses. Exam reveals no gallop.  No murmur heard. No leg edema. No JVD.    Pulmonary/Chest: Effort normal and breath sounds normal. No accessory muscle usage. No respiratory distress.  Abdominal: Soft.   Laboratory examination:   Recent Labs    02/10/19 1003  NA 134  K 4.8  CL 101  CO2 21  GLUCOSE 101*  BUN 14  CREATININE 1.02  CALCIUM 9.1  GFRNONAA 73  GFRAA 84   CrCl cannot be calculated (Patient's most recent lab result is older than the maximum 21 days allowed.).  CMP Latest Ref Rng & Units 02/10/2019 12/06/2015 01/15/2014  Glucose 65 - 99 mg/dL 04/12/2019) 02/05/2016) 01/17/2014)  BUN 8 - 27 mg/dL 14 13 11   Creatinine 0.76 -  1.27 mg/dL 2.95 6.21 3.08  Sodium 134 - 144 mmol/L 134 129(L) 134(L)  Potassium 3.5 - 5.2 mmol/L 4.8 4.1 4.1  Chloride 96 - 106 mmol/L 101 98(L) 96  CO2 20 - 29 mmol/L 21 23 26   Calcium 8.6 - 10.2 mg/dL 9.1 8.9 9.1  Total Protein 6.0 - 8.5 g/dL 6.6 7.8 -  Total Bilirubin 0.0 - 1.2 mg/dL 0.5 1.1 -  Alkaline Phos 39 - 117 IU/L 57 56 -  AST 0 - 40 IU/L 20 37 -  ALT 0 - 44 IU/L 21 27 -   CBC Latest Ref Rng & Units 02/10/2019 12/06/2015 01/16/2014  WBC 3.4 - 10.8 x10E3/uL 6.6 14.9(H) 7.9  Hemoglobin 13.0 - 17.7 g/dL 12.7(L) 14.1 12.2(L)  Hematocrit 37.5 -  51.0 % 37.2(L) 39.3 35.7(L)  Platelets 150 - 450 x10E3/uL 242 236 207   Lipid Panel     Component Value Date/Time   CHOL 148 02/10/2019 1003   TRIG 65 02/10/2019 1003   HDL 62 02/10/2019 1003   CHOLHDL 2.4 02/10/2019 1003   CHOLHDL 3.5 01/15/2014 0336   VLDL 20 01/15/2014 0336   LDLCALC 73 02/10/2019 1003   HEMOGLOBIN A1C Lab Results  Component Value Date   HGBA1C 5.6 01/14/2014   MPG 114 01/14/2014   TSH No results for input(s): TSH in the last 8760 hours.  External labs:    TSH 1.770 UIU/ 03/30/2018  Medications and allergies  No Known Allergies   Current Outpatient Medications  Medication Instructions  . aspirin 81 mg, Oral, Daily  . metoprolol tartrate (LOPRESSOR) 25 mg, Oral, 2 times daily  . nitroGLYCERIN (NITROSTAT) 0.4 mg, Sublingual, Every 5 min x3 PRN  . olmesartan-hydrochlorothiazide (BENICAR HCT) 40-12.5 MG tablet 1 tablet, Oral, BH-each morning  . omeprazole (PRILOSEC OTC) 20 mg, Oral, Daily  . simvastatin (ZOCOR) 40 mg, Oral, Daily   Radiology:   No results found.  Cardiac Studies:   Coronary angiogram 01/15/2014: Subtotally occluded PDA, mild diffuse disease in the right coronary artery. Moderate disease in the proximal LAD - 40% and first diagonal branch large with 80% to 90% stenosis in mid segment. LVEF 45-50% with inferior severe hypokinesis. No significant mitral regurgitation. Diffuse coronary calcification.  Abdominal aortic duplex 04/06/2017: Normal abdominal aorta. No evidence of aneurysm.  Lexiscan myoview stress test 03/30/2018: 1. Lexiscan stress test was performed. Exercise capacity was not assessed. Stress symptoms included dyspnea. Peak effect blood pressure was 174/64 mmHg. Stress EKG is non diagnostic for ischemia as it is a pharmacologic stress. In addition, stress electrocardiogram showed sinus tachycardia, incomplete RBBB, no stress arrhythmias and normal stress repolarization. 2. The overall quality of the study is fair. Left  ventricular cavity is noted to be normal on the rest and stress studies. Gated SPECT images reveal normal myocardial thickening and wall motion. The left ventricular ejection fraction was calculated 41%, although visually appears normal. Small sized, mild intensity, reversible perfusion defect in inferior apical myocardium likely represents ischemia. In addition, there is basal inferior myocardium defect seen worse on rest images, which likely represents tissue attenuation artifact. Recommend clinical correlation of ejection fraction with echocardiogram. 3. Intermediate risk study.  Echocardiogram 04/13/2018: 1. Left ventricle cavity is normal in size. Mild concentric hypertrophy of the left ventricle. Normal global wall motion. Visual EF is 60-65%. Doppler evidence of grade I (impaired) diastolic dysfunction, elevated LAP. 2. Trileaflet aortic valve. Mild calcification of the aortic valve annulus. Mild aortic valve leaflet calcification. 3. Mild (Grade I) mitral regurgitation. Mild calcification of  the mitral valve annulus. 4. Mild tricuspid regurgitation. No evidence of pulmonary hypertension  EKG  EKG 09/15/2019: Normal sinus rhythm at rate of 63 bpm, leftward axis, incomplete diagonal branch block.  Poor R wave progression, cannot exclude anteroseptal infarct old.  Nonspecific T abnormality, cannot exclude inferior ischemia.  No significant change from 02/10/2019.   Assessment     ICD-10-CM   1. Coronary artery disease involving native coronary artery of native heart without angina pectoris  I25.10 EKG 12-Lead  2. Essential hypertension  I10 olmesartan-hydrochlorothiazide (BENICAR HCT) 40-12.5 MG tablet  3. Mixed hyperlipidemia  E78.2   4. Dyspnea on exertion  R06.00      Meds ordered this encounter  Medications  . olmesartan-hydrochlorothiazide (BENICAR HCT) 40-12.5 MG tablet    Sig: Take 1 tablet by mouth every morning.    Dispense:  30 tablet    Refill:  2    Medications  Discontinued During This Encounter  Medication Reason  . clopidogrel (PLAVIX) 75 MG tablet Completed Course  . losartan (COZAAR) 100 MG tablet Change in therapy    Recommendations:   Ronald [name]  is a 74 y.o. male  with HTN, Hyperlipidemia, admitted on 01/14/2014 with non-ST elevation myocardial infarction. Angiogram revealed subtotally occluded but diffusely diseased PDA branch of the right coronary artery which was left alone, moderate diffuse RCA disease and high grade D1 disease. He is here on 6 month follow up.    He is presently doing well with stable dyspnea and no recurrence of angina. As his symptoms have remained stable, will continue with watchful waiting and close monitoring of his symptoms in view of abnormal nuclear stress test and known coronary disease.  Blood pressure is elevated, patient has also noticed his pressure to be high, will discontinue losartan and switch him to olmesartan HCT 40/12.5 mg in the morning.  Lipids are well controlled, otherwise stable labs, I will see him back in 6 weeks for potential.  Patient is willing to participate in the "[facility]" LPA trial.  Adrian Prows, MD, [facility] 09/15/2019, 10:40 AM [facility] [place] Pager: [phone] Office: [phone]

## 2019-09-15 ENCOUNTER — Encounter: Payer: Self-pay | Admitting: Cardiology

## 2019-09-15 ENCOUNTER — Other Ambulatory Visit: Payer: Self-pay

## 2019-09-15 ENCOUNTER — Ambulatory Visit: Payer: Medicare Other | Admitting: Cardiology

## 2019-09-15 VITALS — BP 147/69 | HR 65 | Temp 97.8°F | Resp 16 | Ht 68.0 in | Wt 164.0 lb

## 2019-09-15 DIAGNOSIS — E782 Mixed hyperlipidemia: Secondary | ICD-10-CM | POA: Diagnosis not present

## 2019-09-15 DIAGNOSIS — I2510 Atherosclerotic heart disease of native coronary artery without angina pectoris: Secondary | ICD-10-CM

## 2019-09-15 DIAGNOSIS — I10 Essential (primary) hypertension: Secondary | ICD-10-CM | POA: Diagnosis not present

## 2019-09-15 DIAGNOSIS — R0609 Other forms of dyspnea: Secondary | ICD-10-CM

## 2019-09-15 MED ORDER — OLMESARTAN MEDOXOMIL-HCTZ 40-12.5 MG PO TABS: 1.0000 | ORAL_TABLET | ORAL | 2 refills | Status: DC

## 2019-09-25 ENCOUNTER — Other Ambulatory Visit: Payer: Self-pay

## 2019-09-25 MED ORDER — METOPROLOL TARTRATE 25 MG PO TABS: 25.0000 mg | ORAL_TABLET | Freq: Two times a day (BID) | ORAL | 3 refills | Status: DC

## 2019-09-26 ENCOUNTER — Other Ambulatory Visit: Payer: Self-pay

## 2019-09-26 DIAGNOSIS — I10 Essential (primary) hypertension: Secondary | ICD-10-CM

## 2019-09-26 MED ORDER — OLMESARTAN MEDOXOMIL-HCTZ 40-12.5 MG PO TABS: 1.0000 | ORAL_TABLET | ORAL | 2 refills | Status: DC

## 2019-09-26 MED ORDER — OLMESARTAN MEDOXOMIL-HCTZ 40-12.5 MG PO TABS: 1.0000 | ORAL_TABLET | ORAL | 1 refills | Status: DC

## 2019-10-27 ENCOUNTER — Encounter: Payer: Self-pay | Admitting: Cardiology

## 2019-10-27 ENCOUNTER — Ambulatory Visit: Payer: Medicare Other | Admitting: Cardiology

## 2019-10-27 ENCOUNTER — Other Ambulatory Visit: Payer: Self-pay

## 2019-10-27 VITALS — BP 130/61 | HR 58 | Ht 68.0 in | Wt 164.2 lb

## 2019-10-27 DIAGNOSIS — I10 Essential (primary) hypertension: Secondary | ICD-10-CM | POA: Diagnosis not present

## 2019-10-27 DIAGNOSIS — I2510 Atherosclerotic heart disease of native coronary artery without angina pectoris: Secondary | ICD-10-CM

## 2019-10-27 MED ORDER — OLMESARTAN MEDOXOMIL-HCTZ 40-12.5 MG PO TABS: 1.0000 | ORAL_TABLET | ORAL | 3 refills | Status: DC

## 2019-10-27 NOTE — Progress Notes (Signed)
Primary Physician/Referring:  Patient, No Pcp Per  Patient ID: Ronald [name], male    DOB: 01/08/1946, 74 y.o.   MRN: [id number]  Chief Complaint  Patient presents with  . Hypertension  . Follow-up    6 week   HPI:    Ronald [name]  is a 74 y.o. male  with HTN, Hyperlipidemia, admitted on 01/14/2014 with non-ST elevation myocardial infarction. Angiogram revealed subtotally occluded but diffusely diseased PDA branch of the right coronary artery which was left alone, moderate diffuse RCA disease and high grade D1 disease. He is here on 6 month follow up.    Echocardiogram in Dec 2019 revealing normal LVEF, grade 1 diastolic dysfunction. Lexiscan nuclear stress test in Nov 2019 was considered intermediate risk study. Dyspnea was felt to be contributed by underlying pulmonary issues, and if he had worsening symptoms, would consider coronary angiogram.  6 weeks ago I switched him from losartan to olmesartan HCT for hypertension management, he is tolerating this well.  Presents for office visit.  Past Medical History:  Diagnosis Date  . Bronchitis   . Hypertension   . Myocardial infarction [facility])    Past Surgical History:  Procedure Laterality Date  . LEFT HEART CATHETERIZATION WITH CORONARY ANGIOGRAM N/A 01/15/2014   Procedure: LEFT HEART CATHETERIZATION WITH CORONARY ANGIOGRAM;  Surgeon: Micheline Chapman, MD;  Location: [facility] CATH LAB;  Service: Cardiovascular;  Laterality: N/A;   History reviewed. No pertinent family history.  Social History   Tobacco Use  . Smoking status: Current Every Day Smoker    Types: Cigars  . Smokeless tobacco: Never Used  Substance Use Topics  . Alcohol use: Yes    Comment: beer occ   Marital Status: Married  ROS  Review of Systems  Constitutional: Negative for weight gain.  Cardiovascular: Negative for dyspnea on exertion, leg swelling and syncope.  Respiratory: Negative for shortness of breath.   Musculoskeletal: Positive for arthritis.  Negative for joint swelling.   Objective  Blood pressure 130/61, pulse (!) 58, height 5\' 8"  (1.727 m), weight 164 lb 3.2 oz (74.5 kg), SpO2 98 %.  Vitals with BMI 10/27/2019 09/15/2019 02/10/2019  Height 5\' 8"  5\' 8"  5\' 8"   Weight 164 lbs 3 oz 164 lbs 169 lbs 14 oz  BMI 24.97 24.94 25.84  Systolic 130 147 04/12/2019  Diastolic 61 69 69  Pulse 58 65 60     Physical Exam Constitutional:      General: He is not in acute distress.    Appearance: He is well-developed.  Cardiovascular:     Rate and Rhythm: Normal rate and regular rhythm.     Pulses: Intact distal pulses.     Heart sounds: No murmur heard.  No gallop.      Comments: No leg edema. No JVD.   Pulmonary:     Effort: Pulmonary effort is normal. No accessory muscle usage or respiratory distress.     Breath sounds: Normal breath sounds.  Abdominal:     Palpations: Abdomen is soft.    Laboratory examination:   Recent Labs    02/10/19 1003  NA 134  K 4.8  CL 101  CO2 21  GLUCOSE 101*  BUN 14  CREATININE 1.02  CALCIUM 9.1  GFRNONAA 73  GFRAA 84   CrCl cannot be calculated (Patient's most recent lab result is older than the maximum 21 days allowed.).  CMP Latest Ref Rng & Units 02/10/2019 12/06/2015 01/15/2014  Glucose 65 - 99 mg/dL 04/12/19) 04/12/2019)  112(H)  BUN 8 - 27 mg/dL 14 13 11   Creatinine 0.76 - 1.27 mg/dL 3.87 5.64  Sodium 134 - 144 mmol/L 134 129(L) 134(L)  Potassium 3.5 - 5.2 mmol/L 4.8 4.1 4.1  Chloride 96 - 106 mmol/L 101 98(L) 96  CO2 20 - 29 mmol/L 21 23 26   Calcium 8.6 - 10.2 mg/dL 9.1 8.9 9.1  Total Protein 6.0 - 8.5 g/dL 6.6 7.8 -  Total Bilirubin 0.0 - 1.2 mg/dL 0.5 1.1 -  Alkaline Phos 39 - 117 IU/L 57 56 -  AST 0 - 40 IU/L 20 37 -  ALT 0 - 44 IU/L 21 27 -   CBC Latest Ref Rng & Units 02/10/2019 12/06/2015 01/16/2014  WBC 3.4 - 10.8 x10E3/uL 6.6 14.9(H) 7.9  Hemoglobin 13.0 - 17.7 g/dL 12.7(L) 14.1 12.2(L)  Hematocrit 37.5 - 51.0 % 37.2(L) 39.3 35.7(L)  Platelets 150 - 450 x10E3/uL 242 236 207    Lipid Panel     Component Value Date/Time   CHOL 148 02/10/2019 1003   TRIG 65 02/10/2019 1003   HDL 62 02/10/2019 1003   CHOLHDL 2.4 02/10/2019 1003   CHOLHDL 3.5 01/15/2014 0336   VLDL 20 01/15/2014 0336   LDLCALC 73 02/10/2019 1003   HEMOGLOBIN A1C Lab Results  Component Value Date   HGBA1C 5.6 01/14/2014   MPG 114 01/14/2014   TSH No results for input(s): TSH in the last 8760 hours.  External labs:    TSH 1.770 UIU/ 03/30/2018  Medications and allergies  No Known Allergies   Current Outpatient Medications  Medication Instructions  . aspirin 81 mg, Oral, Daily  . metoprolol tartrate (LOPRESSOR) 25 mg, Oral, 2 times daily  . nitroGLYCERIN (NITROSTAT) 0.4 mg, Sublingual, Every 5 min x3 PRN  . olmesartan-hydrochlorothiazide (BENICAR HCT) 40-12.5 MG tablet 1 tablet, Oral, BH-each morning  . omeprazole (PRILOSEC OTC) 20 mg, Oral, Daily  . simvastatin (ZOCOR) 40 mg, Oral, Daily   Radiology:   No results found.  Cardiac Studies:   Coronary angiogram 01/15/2014: Subtotally occluded PDA, mild diffuse disease in the right coronary artery. Moderate disease in the proximal LAD - 40% and first diagonal branch large with 80% to 90% stenosis in mid segment. LVEF 45-50% with inferior severe hypokinesis. No significant mitral regurgitation. Diffuse coronary calcification.  Abdominal aortic duplex 04/06/2017: Normal abdominal aorta. No evidence of aneurysm.  Lexiscan myoview stress test 03/30/2018: 1. Lexiscan stress test was performed. Exercise capacity was not assessed. Stress symptoms included dyspnea. Peak effect blood pressure was 174/64 mmHg. Stress EKG is non diagnostic for ischemia as it is a pharmacologic stress. In addition, stress electrocardiogram showed sinus tachycardia, incomplete RBBB, no stress arrhythmias and normal stress repolarization. 2. The overall quality of the study is fair. Left ventricular cavity is noted to be normal on the rest and stress studies.  Gated SPECT images reveal normal myocardial thickening and wall motion. The left ventricular ejection fraction was calculated 41%, although visually appears normal. Small sized, mild intensity, reversible perfusion defect in inferior apical myocardium likely represents ischemia. In addition, there is basal inferior myocardium defect seen worse on rest images, which likely represents tissue attenuation artifact. Recommend clinical correlation of ejection fraction with echocardiogram. 3. Intermediate risk study.  Echocardiogram 04/13/2018: 1. Left ventricle cavity is normal in size. Mild concentric hypertrophy of the left ventricle. Normal global wall motion. Visual EF is 60-65%. Doppler evidence of grade I (impaired) diastolic dysfunction, elevated LAP. 2. Trileaflet aortic valve. Mild calcification of the aortic valve annulus.  Mild aortic valve leaflet calcification. 3. Mild (Grade I) mitral regurgitation. Mild calcification of the mitral valve annulus. 4. Mild tricuspid regurgitation. No evidence of pulmonary hypertension  EKG  EKG 09/15/2019: Normal sinus rhythm at rate of 63 bpm, leftward axis, incomplete diagonal branch block.  Poor R wave progression, cannot exclude anteroseptal infarct old.  Nonspecific T abnormality, cannot exclude inferior ischemia.  No significant change from 02/10/2019.   Assessment   No diagnosis found.   No orders of the defined types were placed in this encounter.   There are no discontinued medications.  Recommendations:   Ronald [name]  is a 74 y.o. male  with HTN, Hyperlipidemia, admitted on 01/14/2014 with non-ST elevation myocardial infarction. Angiogram revealed subtotally occluded but diffusely diseased PDA branch of the right coronary artery which was left alone, moderate diffuse RCA disease and high grade D1 disease. He is here on 6 month follow up.    He is presently doing well with stable dyspnea and no recurrence of angina.  On his last office visit  I discontinued losartan switched him to olmesartan HCT, since then his blood pressure is under excellent control. Lipids are well controlled, otherwise stable labs, I will see him back in 6 months.    Adrian Prows, MD, [facility] 10/27/2019, 9:49 AM [facility] Pager: [phone] Office: [phone]

## 2019-11-01 ENCOUNTER — Other Ambulatory Visit: Payer: Self-pay

## 2019-11-01 DIAGNOSIS — I10 Essential (primary) hypertension: Secondary | ICD-10-CM

## 2019-11-01 MED ORDER — OLMESARTAN MEDOXOMIL-HCTZ 40-12.5 MG PO TABS: 1.0000 | ORAL_TABLET | ORAL | 3 refills | Status: DC

## 2019-12-18 ENCOUNTER — Other Ambulatory Visit: Payer: Self-pay

## 2019-12-21 ENCOUNTER — Other Ambulatory Visit: Payer: Self-pay

## 2019-12-21 MED ORDER — METOPROLOL TARTRATE 25 MG PO TABS
25.0000 mg | ORAL_TABLET | Freq: Two times a day (BID) | ORAL | 3 refills | Status: DC
Start: 2019-12-21 — End: 2020-04-11

## 2020-03-26 ENCOUNTER — Telehealth: Payer: Self-pay

## 2020-03-26 DIAGNOSIS — I10 Essential (primary) hypertension: Secondary | ICD-10-CM

## 2020-03-26 NOTE — Telephone Encounter (Signed)
He was on Losartan 100 mg daily and I switched him to Olme-HCT 40/12.5 mg daily. It could be HCT that may be doing this, send for Plaon Olme 40 mg daily and see if he agrees with it. JG

## 2020-03-26 NOTE — Telephone Encounter (Signed)
Pt called to inform us that he can not take Olmesatan-Hydrochlorothiazide due to itching and would like to go back to Lisinopril. Please advise.

## 2020-03-27 MED ORDER — LOSARTAN POTASSIUM 100 MG PO TABS: 100.0000 mg | ORAL_TABLET | Freq: Every day | ORAL | 3 refills | Status: DC

## 2020-03-27 NOTE — Telephone Encounter (Signed)
ICD-10-CM   1. Essential hypertension  I10 losartan (COZAAR) 100 MG tablet   Meds ordered this encounter  Medications  . losartan (COZAAR) 100 MG tablet    Sig: Take 1 tablet (100 mg total) by mouth daily.    Dispense:  90 tablet    Refill:  3    Medications Discontinued During This Encounter  Medication Reason  . olmesartan-hydrochlorothiazide (BENICAR HCT) 40-12.5 MG tablet Side effect (s)    Per patient itching.   [name], MD, [facility] 03/27/2020, 7:04 PM Office: [phone] Pager: [phone]

## 2020-03-27 NOTE — Telephone Encounter (Signed)
Called pt to inform him about trying only olme 40mg . Pt is still adamant about wanting to take losartan 100mg . Please adviise

## 2020-04-11 ENCOUNTER — Ambulatory Visit: Payer: Medicare Other | Admitting: Cardiology

## 2020-04-11 ENCOUNTER — Other Ambulatory Visit: Payer: Self-pay

## 2020-04-11 ENCOUNTER — Encounter: Payer: Self-pay | Admitting: Cardiology

## 2020-04-11 VITALS — BP 131/60 | HR 65 | Resp 16 | Ht 68.0 in | Wt 170.6 lb

## 2020-04-11 DIAGNOSIS — E782 Mixed hyperlipidemia: Secondary | ICD-10-CM

## 2020-04-11 DIAGNOSIS — K439 Ventral hernia without obstruction or gangrene: Secondary | ICD-10-CM | POA: Diagnosis not present

## 2020-04-11 DIAGNOSIS — I2510 Atherosclerotic heart disease of native coronary artery without angina pectoris: Secondary | ICD-10-CM

## 2020-04-11 DIAGNOSIS — I10 Essential (primary) hypertension: Secondary | ICD-10-CM

## 2020-04-11 MED ORDER — METOPROLOL TARTRATE 25 MG PO TABS: 25.0000 mg | ORAL_TABLET | Freq: Two times a day (BID) | ORAL | 3 refills | Status: DC

## 2020-04-11 NOTE — Progress Notes (Signed)
Primary Physician/Referring:  Zachery Dauer, MD  Patient ID: Ronald [name], male    DOB: 03/11/1946, 74 y.o.   MRN: [id number]  Chief Complaint  Patient presents with  . Coronary Artery Disease  . Hypertension  . Follow-up    6 month   HPI:    BRODRICK CURRAN  is a 74 y.o. male  with HTN, Hyperlipidemia, admitted on 01/14/2014 with non-ST elevation myocardial infarction. Angiogram revealed subtotally occluded but diffusely diseased PDA branch of the right coronary artery which was left alone, moderate diffuse RCA disease and high grade D1 disease. He is here on 6 month follow up.    Echocardiogram in Dec 2019 revealing normal LVEF, grade 1 diastolic dysfunction. Lexiscan nuclear stress test in Nov 2019 was considered intermediate risk study. Dyspnea was felt to be contributed by underlying pulmonary issues, and if he had worsening symptoms, would consider coronary angiogram.  He comes in for a 74-month office visit, no change in his dyspnea, still smoking about 3 cigars a day, he has developed abdominal swelling and wanted me to look at it.  No abdominal discomfort, no melena.  No change in his weight.  Past Medical History:  Diagnosis Date  . Bronchitis   . Hypertension   . Myocardial infarction [facility])    Past Surgical History:  Procedure Laterality Date  . LEFT HEART CATHETERIZATION WITH CORONARY ANGIOGRAM N/A 01/15/2014   Procedure: LEFT HEART CATHETERIZATION WITH CORONARY ANGIOGRAM;  Surgeon: Blane Ohara, MD;  Location: [facility] CATH LAB;  Service: Cardiovascular;  Laterality: N/A;   History reviewed. No pertinent family history.  Social History   Tobacco Use  . Smoking status: Current Every Day Smoker    Types: Cigars  . Smokeless tobacco: Never Used  Substance Use Topics  . Alcohol use: Yes    Comment: beer occ   Marital Status: Married  ROS  Review of Systems  Constitutional: Negative for weight gain.  Cardiovascular: Negative for dyspnea on exertion, leg swelling  and syncope.  Respiratory: Negative for shortness of breath.   Musculoskeletal: Positive for arthritis. Negative for joint swelling.   Objective  Blood pressure 131/60, pulse 65, resp. rate 16, height $RemoveBe'5\' 8"'qdGrATqGa$  (1.727 m), weight 170 lb 9.6 oz (77.4 kg), SpO2 98 %.  Vitals with BMI 04/11/2020 10/27/2019 09/15/2019  Height $Remov'5\' 8"'qbbDEe$  $Remove'5\' 8"'ytQVXdP$  $RemoveB'5\' 8"'ehkXoigp$   Weight 170 lbs 10 oz 164 lbs 3 oz 164 lbs  BMI 25.95 70.96 28.36  Systolic 629 476 546  Diastolic 60 61 69  Pulse 65 58 65     Physical Exam Constitutional:      General: He is not in acute distress.    Appearance: He is well-developed.  Cardiovascular:     Rate and Rhythm: Normal rate and regular rhythm.     Pulses: Intact distal pulses.     Heart sounds: No murmur heard. No gallop.      Comments: No leg edema. No JVD.   Pulmonary:     Effort: Pulmonary effort is normal. No accessory muscle usage or respiratory distress.     Breath sounds: Normal breath sounds.  Abdominal:     Palpations: Abdomen is soft.     Hernia: A hernia (reducible) is present. Hernia is present in the ventral area.    Laboratory examination:   No results for input(s): NA, K, CL, CO2, GLUCOSE, BUN, CREATININE, CALCIUM, GFRNONAA, GFRAA in the last 8760 hours. CrCl cannot be calculated (Patient's most recent lab result is older than  the maximum 21 days allowed.).  CMP Latest Ref Rng & Units 02/10/2019 12/06/2015 01/15/2014  Glucose 65 - 99 mg/dL 101(H) 134(H) 112(H)  BUN 8 - 27 mg/dL $Remove'14 13 11  'AXVAMgS$ Creatinine 0.76 - 1.27 mg/dL 1.02 0.99 0.90  Sodium 134 - 144 mmol/L 134 129(L) 134(L)  Potassium 3.5 - 5.2 mmol/L 4.8 4.1 4.1  Chloride 96 - 106 mmol/L 101 98(L) 96  CO2 20 - 29 mmol/L $RemoveB'21 23 26  'lvrUDgBZ$ Calcium 8.6 - 10.2 mg/dL 9.1 8.9 9.1  Total Protein 6.0 - 8.5 g/dL 6.6 7.8 -  Total Bilirubin 0.0 - 1.2 mg/dL 0.5 1.1 -  Alkaline Phos 39 - 117 IU/L 57 56 -  AST 0 - 40 IU/L 20 37 -  ALT 0 - 44 IU/L 21 27 -   CBC Latest Ref Rng & Units 02/10/2019 12/06/2015 01/16/2014  WBC 3.4 - 10.8 x10E3/uL  6.6 14.9(H) 7.9  Hemoglobin 13.0 - 17.7 g/dL 12.7(L) 14.1 12.2(L)  Hematocrit 37.5 - 51.0 % 37.2(L) 39.3 35.7(L)  Platelets 150 - 450 x10E3/uL 242 236 207   Lipid Panel     Component Value Date/Time   CHOL 148 02/10/2019 1003   TRIG 65 02/10/2019 1003   HDL 62 02/10/2019 1003   CHOLHDL 2.4 02/10/2019 1003   CHOLHDL 3.5 01/15/2014 0336   VLDL 20 01/15/2014 0336   LDLCALC 73 02/10/2019 1003   HEMOGLOBIN A1C Lab Results  Component Value Date   HGBA1C 5.6 01/14/2014   MPG 114 01/14/2014   TSH No results for input(s): TSH in the last 8760 hours.  External labs:    TSH 1.770 UIU/ 03/30/2018  Medications and allergies  No Known Allergies   Current Outpatient Medications  Medication Instructions  . aspirin 81 mg, Oral, Daily  . hydrochlorothiazide (HYDRODIURIL) 12.5 mg, Oral, Daily  . metoprolol tartrate (LOPRESSOR) 25 mg, Oral, 2 times daily  . nitroGLYCERIN (NITROSTAT) 0.4 mg, Sublingual, Every 5 min x3 PRN  . olmesartan (BENICAR) 40 mg, Oral, Daily  . omeprazole (PRILOSEC OTC) 20 mg, Oral, Daily  . simvastatin (ZOCOR) 40 mg, Oral, Daily   Radiology:   No results found.  Cardiac Studies:   Coronary angiogram 01/15/2014: Subtotally occluded PDA, mild diffuse disease in the right coronary artery. Moderate disease in the proximal LAD - 40% and first diagonal branch large with 80% to 90% stenosis in mid segment. LVEF 45-50% with inferior severe hypokinesis. No significant mitral regurgitation. Diffuse coronary calcification.  Abdominal aortic duplex 04/06/2017: Normal abdominal aorta. No evidence of aneurysm.  Lexiscan myoview stress test 03/30/2018: 1. Lexiscan stress test was performed. Exercise capacity was not assessed. Stress symptoms included dyspnea. Peak effect blood pressure was 174/64 mmHg. Stress EKG is non diagnostic for ischemia as it is a pharmacologic stress. In addition, stress electrocardiogram showed sinus tachycardia, incomplete RBBB, no stress  arrhythmias and normal stress repolarization. 2. The overall quality of the study is fair. Left ventricular cavity is noted to be normal on the rest and stress studies. Gated SPECT images reveal normal myocardial thickening and wall motion. The left ventricular ejection fraction was calculated 41%, although visually appears normal. Small sized, mild intensity, reversible perfusion defect in inferior apical myocardium likely represents ischemia. In addition, there is basal inferior myocardium defect seen worse on rest images, which likely represents tissue attenuation artifact. Recommend clinical correlation of ejection fraction with echocardiogram. 3. Intermediate risk study.  Echocardiogram 04/13/2018: 1. Left ventricle cavity is normal in size. Mild concentric hypertrophy of the left ventricle. Normal global wall  motion. Visual EF is 60-65%. Doppler evidence of grade I (impaired) diastolic dysfunction, elevated LAP. 2. Trileaflet aortic valve. Mild calcification of the aortic valve annulus. Mild aortic valve leaflet calcification. 3. Mild (Grade I) mitral regurgitation. Mild calcification of the mitral valve annulus. 4. Mild tricuspid regurgitation. No evidence of pulmonary hypertension  EKG  EKG 09/15/2019: Normal sinus rhythm at rate of 63 bpm, leftward axis, incomplete diagonal branch block.  Poor R wave progression, cannot exclude anteroseptal infarct old.  Nonspecific T abnormality, cannot exclude inferior ischemia.  No significant change from 02/10/2019.   Assessment     ICD-10-CM   1. Coronary artery disease involving native coronary artery of native heart without angina pectoris  I25.10 metoprolol tartrate (LOPRESSOR) 25 MG tablet  2. Essential hypertension  I10 EKG 12-Lead    CBC    CMP14+EGFR    metoprolol tartrate (LOPRESSOR) 25 MG tablet  3. Mixed hyperlipidemia  E78.2 Lipid Panel With LDL/HDL Ratio  4. Ventral hernia without obstruction or gangrene  K43.9      Meds ordered  this encounter  Medications  . metoprolol tartrate (LOPRESSOR) 25 MG tablet    Sig: Take 1 tablet (25 mg total) by mouth 2 (two) times daily.    Dispense:  180 tablet    Refill:  3    Medications Discontinued During This Encounter  Medication Reason  . losartan (COZAAR) 100 MG tablet Change in therapy  . metoprolol tartrate (LOPRESSOR) 25 MG tablet Reorder    Recommendations:   CALAN DOREN  is a 74 y.o. male  with HTN, Hyperlipidemia, admitted on 01/14/2014 with non-ST elevation myocardial infarction. Angiogram revealed subtotally occluded but diffusely diseased PDA branch of the right coronary artery which was left alone, moderate diffuse RCA disease and high grade D1 disease. He is here on 6 month follow up.    He is presently doing well with stable dyspnea and no recurrence of angina.  Blood pressures well controlled, he needs lipids and routine labs including CMP and CBC.  He has developed a large ventral hernia over the past month or 2, I discussed wearing abdominal belt and how to avoid putting stress on the abdomen.  If he feels like he wants to have a surgical opinion, we could certainly make a referral.  He would be overall low risk for surgery.  I did not make any changes to his medications, all his risk factors are well controlled.  Again I have requested Tom to quit smoking cigars.     Adrian Prows, MD, [facility] 04/11/2020, 9:46 AM Office: [phone] Pager: [phone]

## 2020-04-29 ENCOUNTER — Ambulatory Visit: Payer: Non-veteran care | Admitting: Cardiology

## 2020-05-01 ENCOUNTER — Ambulatory Visit: Payer: Medicare Other | Admitting: Cardiology

## 2020-07-09 ENCOUNTER — Other Ambulatory Visit: Payer: Self-pay

## 2020-07-09 MED ORDER — OLMESARTAN MEDOXOMIL 40 MG PO TABS
40.0000 mg | ORAL_TABLET | Freq: Every day | ORAL | 6 refills | Status: DC
Start: 2020-07-09 — End: 2020-09-25

## 2020-09-25 ENCOUNTER — Other Ambulatory Visit: Payer: Self-pay

## 2020-09-25 DIAGNOSIS — I2510 Atherosclerotic heart disease of native coronary artery without angina pectoris: Secondary | ICD-10-CM

## 2020-09-25 DIAGNOSIS — I10 Essential (primary) hypertension: Secondary | ICD-10-CM

## 2020-09-25 MED ORDER — METOPROLOL TARTRATE 25 MG PO TABS: 25.0000 mg | ORAL_TABLET | Freq: Two times a day (BID) | ORAL | 3 refills | Status: DC

## 2020-09-25 MED ORDER — SIMVASTATIN 40 MG PO TABS: 40.0000 mg | ORAL_TABLET | Freq: Every day | ORAL | 3 refills | Status: DC

## 2020-09-25 MED ORDER — OMEPRAZOLE MAGNESIUM 20 MG PO TBEC
20.0000 mg | DELAYED_RELEASE_TABLET | Freq: Every day | ORAL | 3 refills | Status: AC
Start: 2020-09-25 — End: ?

## 2020-09-25 MED ORDER — OLMESARTAN MEDOXOMIL 40 MG PO TABS: 40.0000 mg | ORAL_TABLET | Freq: Every day | ORAL | 6 refills | Status: DC

## 2020-09-25 MED ORDER — ASPIRIN 81 MG PO TBEC: 81.0000 mg | DELAYED_RELEASE_TABLET | Freq: Every day | ORAL | 3 refills | Status: AC

## 2020-09-25 MED ORDER — NITROGLYCERIN 0.4 MG SL SUBL
0.4000 mg | SUBLINGUAL_TABLET | SUBLINGUAL | 2 refills | Status: AC | PRN
Start: 2020-09-25 — End: ?

## 2020-10-01 ENCOUNTER — Other Ambulatory Visit: Payer: Self-pay

## 2020-10-01 DIAGNOSIS — I2510 Atherosclerotic heart disease of native coronary artery without angina pectoris: Secondary | ICD-10-CM

## 2020-10-01 DIAGNOSIS — I10 Essential (primary) hypertension: Secondary | ICD-10-CM

## 2020-10-01 MED ORDER — METOPROLOL TARTRATE 25 MG PO TABS: 25.0000 mg | ORAL_TABLET | Freq: Two times a day (BID) | ORAL | 3 refills | Status: DC

## 2020-10-01 MED ORDER — OLMESARTAN MEDOXOMIL 40 MG PO TABS: 40.0000 mg | ORAL_TABLET | Freq: Every day | ORAL | 3 refills | Status: DC

## 2020-10-08 ENCOUNTER — Other Ambulatory Visit: Payer: Self-pay

## 2020-10-08 MED ORDER — HYDROCHLOROTHIAZIDE 12.5 MG PO TABS: 12.5000 mg | ORAL_TABLET | Freq: Every day | ORAL | 3 refills | Status: DC

## 2020-10-24 ENCOUNTER — Telehealth: Payer: Self-pay

## 2020-10-24 DIAGNOSIS — I10 Essential (primary) hypertension: Secondary | ICD-10-CM

## 2020-10-24 NOTE — Telephone Encounter (Signed)
done

## 2020-10-24 NOTE — Telephone Encounter (Signed)
Does he want me to switch to Olmesartan and HCTZ separately (He was on them before combining them)?

## 2020-10-25 MED ORDER — VALSARTAN 80 MG PO TABS: 80.0000 mg | ORAL_TABLET | Freq: Every day | ORAL | 3 refills | Status: DC

## 2020-10-25 NOTE — Addendum Note (Signed)
Addended by: Delrae Rend on: 10/25/2020 11:05 PM   Modules accepted: Orders

## 2020-10-25 NOTE — Telephone Encounter (Signed)
Medication change done.    ICD-10-CM   1. Essential hypertension  I10 valsartan (DIOVAN) 80 MG tablet     Meds ordered this encounter  Medications   valsartan (DIOVAN) 80 MG tablet    Sig: Take 1 tablet (80 mg total) by mouth daily.    Dispense:  90 tablet    Refill:  3    Medications Discontinued During This Encounter  Medication Reason   hydrochlorothiazide (HYDRODIURIL) 12.5 MG tablet Patient Preference   olmesartan (BENICAR) 40 MG tablet Change in therapy     [name], MD, [facility] 10/25/2020, 11:04 PM Office: [phone] Fax: [phone] Pager: [phone]

## 2020-10-25 NOTE — Telephone Encounter (Signed)
Pt mention that he wants to start on the Valsartan 80 again and does not want to take the HCTZ anymore.

## 2020-10-28 ENCOUNTER — Other Ambulatory Visit: Payer: Self-pay

## 2020-10-28 DIAGNOSIS — I10 Essential (primary) hypertension: Secondary | ICD-10-CM

## 2020-10-28 MED ORDER — VALSARTAN 80 MG PO TABS: 80.0000 mg | ORAL_TABLET | Freq: Every day | ORAL | 3 refills | Status: DC

## 2020-11-15 ENCOUNTER — Other Ambulatory Visit: Payer: Self-pay

## 2020-11-15 DIAGNOSIS — I10 Essential (primary) hypertension: Secondary | ICD-10-CM

## 2020-11-15 MED ORDER — VALSARTAN 80 MG PO TABS: 80.0000 mg | ORAL_TABLET | Freq: Every day | ORAL | 3 refills | Status: DC

## 2021-01-24 ENCOUNTER — Telehealth: Payer: Self-pay | Admitting: Cardiology

## 2021-01-24 NOTE — Telephone Encounter (Signed)
Patient's wife calling w/questions about valsartan. Asked to speak with you.

## 2021-02-03 ENCOUNTER — Telehealth: Payer: Self-pay | Admitting: Cardiology

## 2021-02-03 NOTE — Telephone Encounter (Signed)
Pt is requesting a call from a MA RE: some symptoms he is experiencing from meds

## 2021-02-05 ENCOUNTER — Telehealth: Payer: Self-pay

## 2021-02-05 NOTE — Telephone Encounter (Signed)
Called pt to inform him to stop the metoprolol and to call in a week

## 2021-02-05 NOTE — Telephone Encounter (Signed)
It is probably metoprolol that is doing that. Ask him to stop Metoprolol and let us know after a week and check his BP often

## 2021-02-13 ENCOUNTER — Telehealth: Payer: Self-pay

## 2021-02-13 DIAGNOSIS — I2510 Atherosclerotic heart disease of native coronary artery without angina pectoris: Secondary | ICD-10-CM

## 2021-02-13 DIAGNOSIS — I10 Essential (primary) hypertension: Secondary | ICD-10-CM

## 2021-02-13 NOTE — Telephone Encounter (Signed)
"   Patient called back, yelling and was extremely rude and upset because he said "no one cares about his health" I explained to him about your recommendation, patient is agreeable. He said he will call you sometime next week.

## 2021-02-13 NOTE — Telephone Encounter (Signed)
Patient called stated that he has stopped all medications because he was tired of "pooping all day, and spending all day in the bathroom." Patient was advised to stop the Metoprolol, but was still continuing to have diarrhea. So he decided to stop all medications on his own. He would like to know what he should do now and would like a phone call because he said he is not feeling when he had his heart attack.

## 2021-02-13 NOTE — Telephone Encounter (Signed)
Called patient on both phones, NA, LMAM

## 2021-02-13 NOTE — Telephone Encounter (Signed)
Let us see if the symptoms improve. If no change, then it is not the medications. This should go in 2-4 days. If not then he should contact his GI doctor or PCP

## 2021-02-14 ENCOUNTER — Other Ambulatory Visit: Payer: Self-pay | Admitting: Cardiology

## 2021-02-14 DIAGNOSIS — I10 Essential (primary) hypertension: Secondary | ICD-10-CM

## 2021-02-14 DIAGNOSIS — I2510 Atherosclerotic heart disease of native coronary artery without angina pectoris: Secondary | ICD-10-CM

## 2021-02-14 MED ORDER — CARVEDILOL 6.25 MG PO TABS: 6.2500 mg | ORAL_TABLET | Freq: Two times a day (BID) | ORAL | 1 refills | Status: DC

## 2021-02-14 MED ORDER — CARVEDILOL 6.25 MG PO TABS: 6.2500 mg | ORAL_TABLET | Freq: Two times a day (BID) | ORAL | 0 refills | Status: DC

## 2021-02-14 NOTE — Telephone Encounter (Signed)
Chief Complaint  Patient presents with   Diarrhea    Loose stools   Patient is seeing Dr. Elnoria Howard  in 3 days.    ICD-10-CM   1. Coronary artery disease involving native coronary artery of native heart without angina pectoris  I25.10 carvedilol (COREG) 6.25 MG tablet    2. Essential hypertension  I10 carvedilol (COREG) 6.25 MG tablet      Medications Discontinued During This Encounter  Medication Reason   metoprolol tartrate (LOPRESSOR) 25 MG tablet Side effect (s)    Meds ordered this encounter  Medications   carvedilol (COREG) 6.25 MG tablet    Sig: Take 1 tablet (6.25 mg total) by mouth 2 (two) times daily.    Dispense:  180 tablet    Refill:  1    Discontinue Metoprolol Tartarate, Diarrhea     He wants to try Coreg again which was discontinued after NSTEMI in March 2021.   10 minutes telephone encounter.   [name], MD, [facility] 02/14/2021, 11:47 AM Office: [phone] Fax: [phone] Pager: [phone]

## 2021-02-20 NOTE — Telephone Encounter (Signed)
Patient's wife called to report that he has had diarrhea ongoing since he was initially started on olmesartan.  Patient had previously called the office with this concern and switch to valsartan.  However diarrhea continued and he discontinued all medications for 4-5 days.  Diarrheal symptoms were unchanged during this period of time when he was off of all medications.  Patient subsequently resumed aspirin, simvastatin, valsartan, and metoprolol.  He continued to have diarrhea therefore was switched to carvedilol from metoprolol.  Patient's wife called today to report that patient's symptoms have continued and she feels strongly it is related to valsartan.  Advised that patient may hold valsartan for 3 days.  If diarrhea does not improve valsartan is not the cause and he may resume valsartan accordingly.  Patient will notify our office if diarrhea does improve while holding valsartan.  He also has upcoming appointment with GI/PCP, will defer further evaluation and management of diarrhea to them.  Patient also reports he experienced diffuse itching on metoprolol which had gotten better when he was off of beta-blocker therapy.  However itching has resumed since starting carvedilol.  Advised that would first like to experiment with holding valsartan so as not to make it difficult to ascertain what may be causing diarrhea.  However could consider holding carvedilol in the future and switching to a different beta-blocker.  Patient's wife verbalized understanding and agreement with the plan.   Rayford Halsted, PA-C 02/20/2021, 11:36 AM Office: [phone]

## 2021-02-20 NOTE — Telephone Encounter (Signed)
Pts wife called back and stated that the pt is still having diarrhea and she thinks it may be due to the valsartan and olmesartan. Pt has an appointment in 3 days with GI.

## 2021-02-24 ENCOUNTER — Telehealth: Payer: Self-pay

## 2021-02-24 ENCOUNTER — Other Ambulatory Visit: Payer: Self-pay

## 2021-02-24 DIAGNOSIS — I10 Essential (primary) hypertension: Secondary | ICD-10-CM

## 2021-02-24 MED ORDER — SPIRONOLACTONE 25 MG PO TABS: 25.0000 mg | ORAL_TABLET | Freq: Every day | ORAL | 3 refills | Status: DC

## 2021-02-24 NOTE — Telephone Encounter (Signed)
This has been addressed by Dr. Jacinto Halim

## 2021-02-24 NOTE — Telephone Encounter (Signed)
Patients wife called to say this his diarrhea has improved since being off the meds.  Wife cell [phone]

## 2021-03-07 ENCOUNTER — Other Ambulatory Visit: Payer: Self-pay | Admitting: Cardiology

## 2021-03-07 DIAGNOSIS — I2510 Atherosclerotic heart disease of native coronary artery without angina pectoris: Secondary | ICD-10-CM

## 2021-03-12 ENCOUNTER — Telehealth: Payer: Self-pay

## 2021-03-12 DIAGNOSIS — I2510 Atherosclerotic heart disease of native coronary artery without angina pectoris: Secondary | ICD-10-CM | POA: Diagnosis not present

## 2021-03-12 NOTE — Telephone Encounter (Signed)
Nothing to do with the medication

## 2021-03-13 LAB — CMP14+EGFR
ALT: 20 IU/L (ref 0–44)
AST: 21 IU/L (ref 0–40)
Albumin/Globulin Ratio: 2.5 — ABNORMAL HIGH (ref 1.2–2.2)
Albumin: 4.5 g/dL (ref 3.7–4.7)
Alkaline Phosphatase: 60 IU/L (ref 44–121)
BUN/Creatinine Ratio: 9 — ABNORMAL LOW (ref 10–24)
BUN: 9 mg/dL (ref 8–27)
Bilirubin Total: 0.4 mg/dL (ref 0.0–1.2)
CO2: 23 mmol/L (ref 20–29)
Calcium: 8.6 mg/dL (ref 8.6–10.2)
Chloride: 107 mmol/L — ABNORMAL HIGH (ref 96–106)
Creatinine, Ser: 1.05 mg/dL (ref 0.76–1.27)
Globulin, Total: 1.8 g/dL (ref 1.5–4.5)
Glucose: 105 mg/dL — ABNORMAL HIGH (ref 70–99)
Potassium: 4.3 mmol/L (ref 3.5–5.2)
Sodium: 144 mmol/L (ref 134–144)
Total Protein: 6.3 g/dL (ref 6.0–8.5)
eGFR: 74 mL/min/{1.73_m2} (ref >59)

## 2021-03-13 LAB — LIPID PANEL WITH LDL/HDL RATIO
Cholesterol, Total: 162 mg/dL (ref 100–199)
HDL: 53 mg/dL (ref >39)
LDL Chol Calc (NIH): 93 mg/dL (ref 0–99)
LDL/HDL Ratio: 1.8 ratio (ref 0.0–3.6)
Triglycerides: 88 mg/dL (ref 0–149)
VLDL Cholesterol Cal: 16 mg/dL (ref 5–40)

## 2021-03-13 LAB — CBC
Hematocrit: 37.5 % (ref 37.5–51.0)
Hemoglobin: 12.8 g/dL — ABNORMAL LOW (ref 13.0–17.7)
MCH: 31.8 pg (ref 26.6–33.0)
MCHC: 34.1 g/dL (ref 31.5–35.7)
MCV: 93 fL (ref 79–97)
Platelets: 205 10*3/uL (ref 150–450)
RBC: 4.03 x10E6/uL — ABNORMAL LOW (ref 4.14–5.80)
RDW: 12.7 % (ref 11.6–15.4)
WBC: 6.1 10*3/uL (ref 3.4–10.8)

## 2021-03-13 NOTE — Progress Notes (Signed)
Please schedule him for an OV with me elective

## 2021-03-17 ENCOUNTER — Ambulatory Visit: Payer: Medicare Other | Admitting: Cardiology

## 2021-03-17 ENCOUNTER — Encounter: Payer: Self-pay | Admitting: Cardiology

## 2021-03-17 ENCOUNTER — Other Ambulatory Visit: Payer: Self-pay

## 2021-03-17 VITALS — BP 173/87 | HR 71 | Temp 98.4°F | Resp 17 | Ht 68.0 in | Wt 168.0 lb

## 2021-03-17 DIAGNOSIS — E782 Mixed hyperlipidemia: Secondary | ICD-10-CM

## 2021-03-17 DIAGNOSIS — I10 Essential (primary) hypertension: Secondary | ICD-10-CM | POA: Diagnosis not present

## 2021-03-17 DIAGNOSIS — I2510 Atherosclerotic heart disease of native coronary artery without angina pectoris: Secondary | ICD-10-CM

## 2021-03-17 DIAGNOSIS — R0989 Other specified symptoms and signs involving the circulatory and respiratory systems: Secondary | ICD-10-CM

## 2021-03-17 MED ORDER — CLONIDINE 0.2 MG/24HR TD PTWK: 0.2000 mg | MEDICATED_PATCH | TRANSDERMAL | 2 refills | Status: DC

## 2021-03-17 MED ORDER — SPIRONOLACTONE 25 MG PO TABS: 25.0000 mg | ORAL_TABLET | Freq: Every day | ORAL | 2 refills | Status: DC

## 2021-03-17 NOTE — Progress Notes (Signed)
Primary Physician/Referring:  Greta Doom, MD  Patient ID: Ronald [name], male    DOB: Jun 08, 1945, 75 y.o.   MRN: [id number]  Chief Complaint  Patient presents with   Coronary Artery Disease   Hypertension    1 year   HPI:    Ronald [name]  is a 75 y.o. male  with HTN, Hyperlipidemia, admitted on 01/14/2014 with non-ST elevation myocardial infarction. Angiogram revealed subtotally occluded but diffusely diseased PDA branch of the right coronary artery which was left alone, moderate diffuse RCA disease and high grade D1 disease. He is here on 12 month follow up, patient had developed diarrhea, and hence had discontinued all his medications.  In spite of making multiple changes in his medicines, he has had persistent diarrhea.  It is on and off.  Denies chest pain or dyspnea, accompanied by his wife at the bedside.  Continues to have persistent on and off diarrhea.  He had also called about spironolactone causing hematuria but on further questioning, patient does have history of kidney stones.   Past Medical History:  Diagnosis Date   Bronchitis    Hypertension    Myocardial infarction [facility])    Past Surgical History:  Procedure Laterality Date   LEFT HEART CATHETERIZATION WITH CORONARY ANGIOGRAM N/A 01/15/2014   Procedure: LEFT HEART CATHETERIZATION WITH CORONARY ANGIOGRAM;  Surgeon: Micheline Chapman, MD;  Location: [facility] CATH LAB;  Service: Cardiovascular;  Laterality: N/A;   History reviewed. No pertinent family history.  Social History   Tobacco Use   Smoking status: Every Day    Types: Cigars   Smokeless tobacco: Never  Substance Use Topics   Alcohol use: Yes    Comment: beer occ   Marital Status: Married  ROS  Review of Systems  Constitutional: Negative for weight gain.  Cardiovascular:  Negative for dyspnea on exertion, leg swelling and syncope.  Respiratory:  Negative for shortness of breath.   Musculoskeletal:  Positive for arthritis. Negative for joint  swelling.  Gastrointestinal:  Positive for change in bowel habit and diarrhea.  Objective  Blood pressure (!) 173/87, pulse 71, temperature 98.4 F (36.9 C), temperature source Temporal, resp. rate 17, height 5\' 8"  (1.727 m), weight 168 lb (76.2 kg), SpO2 98 %.  Vitals with BMI 03/17/2021 03/17/2021 04/11/2020  Height - 5\' 8"  5\' 8"   Weight - 168 lbs 170 lbs 10 oz  BMI - 25.55 25.95  Systolic 173 170 14/01/2020  Diastolic 87 84 60  Pulse 71 75 65     Physical Exam Constitutional:      General: He is not in acute distress.    Appearance: He is well-developed.  Neck:     Vascular: Carotid bruit (right) present. No JVD.  Cardiovascular:     Rate and Rhythm: Normal rate and regular rhythm.     Pulses: Normal pulses and intact distal pulses.     Heart sounds: No murmur heard.   No gallop.  Pulmonary:     Effort: Pulmonary effort is normal. No accessory muscle usage or respiratory distress.     Breath sounds: Normal breath sounds.  Abdominal:     Palpations: Abdomen is soft.     Hernia: A hernia (reducible) is present. Hernia is present in the ventral area.  Musculoskeletal:     Right lower leg: No edema.     Left lower leg: No edema.   Laboratory examination:   Recent Labs    03/12/21 0816  NA 144  K  4.3  CL 107*  CO2 23  GLUCOSE 105*  BUN 9  CREATININE 1.05  CALCIUM 8.6   estimated creatinine clearance is 58.8 mL/min (by C-G formula based on SCr of 1.05 mg/dL).  CMP Latest Ref Rng & Units 03/12/2021 02/10/2019 12/06/2015  Glucose 70 - 99 mg/dL 105(H) 101(H) 134(H)  BUN 8 - 27 mg/dL 9 14 13   Creatinine 0.76 - 1.27 mg/dL 1.05 1.02 0.99  Sodium 134 - 144 mmol/L 144 134 129(L)  Potassium 3.5 - 5.2 mmol/L 4.3 4.8 4.1  Chloride 96 - 106 mmol/L 107(H) 101 98(L)  CO2 20 - 29 mmol/L 23 21 23   Calcium 8.6 - 10.2 mg/dL 8.6 9.1 8.9  Total Protein 6.0 - 8.5 g/dL 6.3 6.6 7.8  Total Bilirubin 0.0 - 1.2 mg/dL 0.4 0.5 1.1  Alkaline Phos 44 - 121 IU/L 60 57 56  AST 0 - 40 IU/L 21 20 37   ALT 0 - 44 IU/L 20 21 27    CBC Latest Ref Rng & Units 03/12/2021 02/10/2019 12/06/2015  WBC 3.4 - 10.8 x10E3/uL 6.1 6.6 14.9(H)  Hemoglobin 13.0 - 17.7 g/dL 12.8(L) 12.7(L) 14.1  Hematocrit 37.5 - 51.0 % 37.5 37.2(L) 39.3  Platelets 150 - 450 x10E3/uL 205 242 236   Lipid Panel     Component Value Date/Time   CHOL 162 03/12/2021 0815   TRIG 88 03/12/2021 0815   HDL 53 03/12/2021 0815   CHOLHDL 2.4 02/10/2019 1003   CHOLHDL 3.5 01/15/2014 0336   VLDL 20 01/15/2014 0336   LDLCALC 93 03/12/2021 0815   HEMOGLOBIN A1C Lab Results  Component Value Date   HGBA1C 5.6 01/14/2014   MPG 114 01/14/2014   TSH No results for input(s): TSH in the last 8760 hours.  External labs:    TSH 1.770 UIU/ 03/30/2018  Medications and allergies   Allergies  Allergen Reactions   Lisinopril Rash     Current Outpatient Medications  Medication Instructions   aspirin 81 mg, Oral, Daily   cloNIDine (CATAPRES - DOSED IN MG/24 HR) 0.2 mg, Transdermal, Weekly   nitroGLYCERIN (NITROSTAT) 0.4 mg, Sublingual, Every 5 min x3 PRN   omeprazole (PRILOSEC OTC) 20 mg, Oral, Daily   simvastatin (ZOCOR) 40 mg, Oral, Daily   spironolactone (ALDACTONE) 25 mg, Oral, Daily   Radiology:   No results found.  Cardiac Studies:   Coronary angiogram 01/15/2014: Subtotally occluded PDA, mild diffuse disease in the right coronary artery. Moderate disease in the proximal LAD - 40% and first diagonal branch large with 80% to 90% stenosis in mid segment. LVEF 45-50% with inferior severe hypokinesis. No significant mitral regurgitation. Diffuse coronary calcification.  Abdominal aortic duplex 04/06/2017: Normal abdominal aorta. No evidence of aneurysm.  Lexiscan myoview stress test 03/30/2018: 1. Lexiscan stress test was performed. Exercise capacity was not assessed. Stress symptoms included dyspnea. Peak effect blood pressure was 174/64 mmHg. Stress EKG is non diagnostic for ischemia as it is a pharmacologic stress. In  addition, stress electrocardiogram showed sinus tachycardia, incomplete RBBB, no stress arrhythmias and normal stress repolarization. 2. The overall quality of the study is fair. Left ventricular cavity is noted to be normal on the rest and stress studies. Gated SPECT images reveal normal myocardial thickening and wall motion. The left ventricular ejection fraction was calculated 41%, although visually appears normal. Small sized, mild intensity, reversible perfusion defect in inferior apical myocardium likely represents ischemia. In addition, there is basal inferior myocardium defect seen worse on rest images, which likely represents tissue attenuation artifact.  Recommend clinical correlation of ejection fraction with echocardiogram. 3. Intermediate risk study.  Echocardiogram 04/13/2018: 1. Left ventricle cavity is normal in size. Mild concentric hypertrophy of the left ventricle. Normal global wall motion. Visual EF is 60-65%. Doppler evidence of grade I (impaired) diastolic dysfunction, elevated LAP. 2. Trileaflet aortic valve. Mild calcification of the aortic valve annulus. Mild aortic valve leaflet calcification. 3. Mild (Grade I) mitral regurgitation. Mild calcification of the mitral valve annulus. 4. Mild tricuspid regurgitation. No evidence of pulmonary hypertension  EKG  Labs 03/17/2021: Normal sinus rhythm at rate of 69 bpm, left atrial enlargement, left axis deviation, left anterior fascicular block.  Incomplete right bundle branch block.  No evidence of ischemia.  Compared to 09/15/2019, inferior nonspecific T wave inversions not present.   Assessment     ICD-10-CM   1. Coronary artery disease involving native coronary artery of native heart without angina pectoris  I25.10 EKG 12-Lead    2. Essential hypertension  I10 spironolactone (ALDACTONE) 25 MG tablet    cloNIDine (CATAPRES - DOSED IN MG/24 HR) 0.2 mg/24hr patch    3. Mixed hyperlipidemia  E78.2     4. Right carotid bruit   R09.89 PCV CAROTID DUPLEX (BILATERAL)       Meds ordered this encounter  Medications   spironolactone (ALDACTONE) 25 MG tablet    Sig: Take 1 tablet (25 mg total) by mouth daily.    Dispense:  30 tablet    Refill:  2   cloNIDine (CATAPRES - DOSED IN MG/24 HR) 0.2 mg/24hr patch    Sig: Place 1 patch (0.2 mg total) onto the skin once a week.    Dispense:  4 patch    Refill:  2     Medications Discontinued During This Encounter  Medication Reason   carvedilol (COREG) 6.25 MG tablet Change in therapy   spironolactone (ALDACTONE) 25 MG tablet Reorder     Recommendations:   Ronald [name]  is a 75 y.o. male  with HTN, Hyperlipidemia, admitted on 01/14/2014 with non-ST elevation myocardial infarction. Angiogram revealed subtotally occluded but diffusely diseased PDA branch of the right coronary artery which was left alone, moderate diffuse RCA disease and high grade D1 disease. He is here on 12 month follow up, patient had developed diarrhea, and hence had discontinued all his medications.  In spite of making multiple changes in his medicines, he has had persistent diarrhea.  It is on and off.  Hence I am not sure it is medication related.  However I advised him to discontinue all the medications for now including carvedilol and also hold off the spironolactone which he is not taking anyways and I will try clonidine patch 0.2 mg TTS every week.  If he tolerates this and he has no side effects, then advised him to restart spironolactone at 12.5 mg daily.  He could titrated up if blood pressure is still elevated to 25 mg.  He has a new right carotid bruit, will obtain carotid duplex.  There is no clinical evidence of heart failure, denies any symptoms of angina or dyspnea, hence cardiac stress testing not performed.  His wife is present and I discussed this with her.  He also is still smoking cigars.  Equivalent to a pack of cigarettes or more.  This could also cause diarrhea.  I will see him  back in 4 weeks for follow-up.  I reviewed his labs, renal function has remained stable, CBC stable, lipids are not at goal as his LDL  is >70 however I do not want to make changes right now in view of ongoing GI issues.  He may need GI consultation.  Also advised him that he is on PPI which can also increase bowel movements.    Adrian Prows, MD, [facility] 03/17/2021, [place] PM Office: [phone] Pager: [phone]

## 2021-03-20 ENCOUNTER — Other Ambulatory Visit: Payer: Self-pay

## 2021-03-20 ENCOUNTER — Ambulatory Visit: Payer: Medicare Other

## 2021-03-20 DIAGNOSIS — R0989 Other specified symptoms and signs involving the circulatory and respiratory systems: Secondary | ICD-10-CM | POA: Diagnosis not present

## 2021-03-20 DIAGNOSIS — I6523 Occlusion and stenosis of bilateral carotid arteries: Secondary | ICD-10-CM

## 2021-03-31 ENCOUNTER — Other Ambulatory Visit: Payer: Self-pay

## 2021-03-31 MED ORDER — SIMVASTATIN 40 MG PO TABS: 40.0000 mg | ORAL_TABLET | Freq: Every day | ORAL | 3 refills | Status: DC

## 2021-04-11 ENCOUNTER — Ambulatory Visit: Payer: Medicare Other | Admitting: Cardiology

## 2021-04-11 DIAGNOSIS — R310 Gross hematuria: Secondary | ICD-10-CM | POA: Diagnosis not present

## 2021-04-18 ENCOUNTER — Encounter: Payer: Self-pay | Admitting: Cardiology

## 2021-04-18 ENCOUNTER — Other Ambulatory Visit: Payer: Self-pay

## 2021-04-18 ENCOUNTER — Ambulatory Visit: Payer: Non-veteran care | Admitting: Cardiology

## 2021-04-18 VITALS — BP 146/81 | HR 96 | Temp 98.6°F | Resp 16 | Ht 68.0 in | Wt 171.0 lb

## 2021-04-18 DIAGNOSIS — E782 Mixed hyperlipidemia: Secondary | ICD-10-CM

## 2021-04-18 DIAGNOSIS — I2510 Atherosclerotic heart disease of native coronary artery without angina pectoris: Secondary | ICD-10-CM | POA: Diagnosis not present

## 2021-04-18 DIAGNOSIS — I10 Essential (primary) hypertension: Secondary | ICD-10-CM | POA: Diagnosis not present

## 2021-04-18 DIAGNOSIS — I6523 Occlusion and stenosis of bilateral carotid arteries: Secondary | ICD-10-CM | POA: Diagnosis not present

## 2021-04-18 DIAGNOSIS — I25110 Atherosclerotic heart disease of native coronary artery with unstable angina pectoris: Secondary | ICD-10-CM | POA: Insufficient documentation

## 2021-04-18 MED ORDER — EZETIMIBE 10 MG PO TABS: 10.0000 mg | ORAL_TABLET | Freq: Every day | ORAL | 2 refills | Status: DC

## 2021-04-18 MED ORDER — CLONIDINE 0.3 MG/24HR TD PTWK: 0.3000 mg | MEDICATED_PATCH | TRANSDERMAL | 0 refills | Status: DC

## 2021-04-18 MED ORDER — CLONIDINE 0.3 MG/24HR TD PTWK
0.3000 mg | MEDICATED_PATCH | TRANSDERMAL | 3 refills | Status: DC
Start: 2021-04-18 — End: 2021-05-15

## 2021-04-18 MED ORDER — SPIRONOLACTONE 25 MG PO TABS: 25.0000 mg | ORAL_TABLET | Freq: Every day | ORAL | 3 refills | Status: DC

## 2021-04-18 NOTE — Progress Notes (Signed)
Primary Physician/Referring:  Zachery Dauer, MD  Patient ID: Ronald [name], male    DOB: 1945/10/01, 75 y.o.   MRN: [id number]  Chief Complaint  Patient presents with   Hypertension   Follow-up   HPI:    Ronald [name]  is a 75 y.o. male  with HTN, Hyperlipidemia, admitted on 01/14/2014 with non-ST elevation myocardial infarction. Angiogram revealed subtotally occluded but diffusely diseased PDA branch of the right coronary artery which was left alone, moderate diffuse RCA disease and high grade D1 disease.   I had seen him about a month ago for worsening diarrhea and he had discontinued all his cardiac medications including beta-blockers.  I had started him on clonidine and in view of elevated blood pressure also start him on spironolactone few days later which he is tolerating without any side effects.  Past Medical History:  Diagnosis Date   Bronchitis    Hypertension    Myocardial infarction [facility])    Past Surgical History:  Procedure Laterality Date   LEFT HEART CATHETERIZATION WITH CORONARY ANGIOGRAM N/A 01/15/2014   Procedure: LEFT HEART CATHETERIZATION WITH CORONARY ANGIOGRAM;  Surgeon: Blane Ohara, MD;  Location: [facility] CATH LAB;  Service: Cardiovascular;  Laterality: N/A;   History reviewed. No pertinent family history.  Social History   Tobacco Use   Smoking status: Every Day    Types: Cigars   Smokeless tobacco: Never  Substance Use Topics   Alcohol use: Yes    Comment: beer occ   Marital Status: Married  ROS  Review of Systems  Constitutional: Negative for weight gain.  Cardiovascular:  Negative for dyspnea on exertion, leg swelling and syncope.  Respiratory:  Negative for shortness of breath.   Musculoskeletal:  Positive for arthritis. Negative for joint swelling.  Gastrointestinal:  Negative for diarrhea.  Objective  Blood pressure (!) 146/81, pulse 96, temperature 98.6 F (37 C), temperature source Temporal, resp. rate 16, height 5\' 8"  (1.727 m),  weight 171 lb (77.6 kg), SpO2 96 %.  Vitals with BMI 04/18/2021 03/17/2021 03/17/2021  Height 5\' 8"  - 5\' 8"   Weight 171 lbs - 168 lbs  BMI [id number] - [id number]  Systolic [id number]  Diastolic 81 87 84  Pulse 96 71 75     Physical Exam Constitutional:      General: He is not in acute distress.    Appearance: He is well-developed.  Neck:     Vascular: Carotid bruit (right) present. No JVD.  Cardiovascular:     Rate and Rhythm: Normal rate and regular rhythm.     Pulses: Normal pulses and intact distal pulses.     Heart sounds: No murmur heard.   No gallop.  Pulmonary:     Effort: Pulmonary effort is normal. No accessory muscle usage or respiratory distress.     Breath sounds: Normal breath sounds.  Abdominal:     Palpations: Abdomen is soft.     Hernia: A hernia (reducible) is present. Hernia is present in the ventral area.  Musculoskeletal:     Right lower leg: No edema.     Left lower leg: No edema.   Laboratory examination:   Recent Labs    03/12/21 0816  NA 144  K 4.3  CL 107*  CO2 23  GLUCOSE 105*  BUN 9  CREATININE 1.05  CALCIUM 8.6   CrCl cannot be calculated (Patient's most recent lab result is older than the maximum 21 days allowed.).  CMP Latest Ref Rng &  Units 03/12/2021 02/10/2019 12/06/2015  Glucose 70 - 99 mg/dL 105(H) 101(H) 134(H)  BUN 8 - 27 mg/dL 9 14 13   Creatinine 0.76 - 1.27 mg/dL 1.05 1.02 0.99  Sodium 134 - 144 mmol/L 144 134 129(L)  Potassium 3.5 - 5.2 mmol/L 4.3 4.8 4.1  Chloride 96 - 106 mmol/L 107(H) 101 98(L)  CO2 20 - 29 mmol/L 23 21 23   Calcium 8.6 - 10.2 mg/dL 8.6 9.1 8.9  Total Protein 6.0 - 8.5 g/dL 6.3 6.6 7.8  Total Bilirubin 0.0 - 1.2 mg/dL 0.4 0.5 1.1  Alkaline Phos 44 - 121 IU/L 60 57 56  AST 0 - 40 IU/L 21 20 37  ALT 0 - 44 IU/L 20 21 27    CBC Latest Ref Rng & Units 03/12/2021 02/10/2019 12/06/2015  WBC 3.4 - 10.8 x10E3/uL 6.1 6.6 14.9(H)  Hemoglobin 13.0 - 17.7 g/dL 12.8(L) 12.7(L) 14.1  Hematocrit 37.5 - 51.0 % 37.5 37.2(L) 39.3   Platelets 150 - 450 x10E3/uL 205 242 236   Lipid Panel Recent Labs    03/12/21 0815  CHOL 162  TRIG 88  LDLCALC 93  HDL 53    Lipid Panel     Component Value Date/Time   CHOL 162 03/12/2021 0815   TRIG 88 03/12/2021 0815   HDL 53 03/12/2021 0815   CHOLHDL 2.4 02/10/2019 1003   CHOLHDL 3.5 01/15/2014 0336   VLDL 20 01/15/2014 0336   LDLCALC 93 03/12/2021 0815   HEMOGLOBIN A1C Lab Results  Component Value Date   HGBA1C 5.6 01/14/2014   MPG 114 01/14/2014   TSH No results for input(s): TSH in the last 8760 hours.  External labs:    TSH 1.770 UIU/ 03/30/2018  Medications and allergies   Allergies  Allergen Reactions   Lisinopril Rash     Current Outpatient Medications  Medication Instructions   aspirin 81 mg, Oral, Daily   cloNIDine (CATAPRES - DOSED IN MG/24 HR) 0.3 mg, Transdermal, Weekly   cloNIDine (CATAPRES - DOSED IN MG/24 HR) 0.3 mg, Transdermal, Weekly   ezetimibe (ZETIA) 10 mg, Oral, Daily   nitroGLYCERIN (NITROSTAT) 0.4 mg, Sublingual, Every 5 min x3 PRN   omeprazole (PRILOSEC OTC) 20 mg, Oral, Daily   simvastatin (ZOCOR) 40 mg, Oral, Daily   spironolactone (ALDACTONE) 25 mg, Oral, Daily   Radiology:   No results found.  Cardiac Studies:   Coronary angiogram 01/15/2014: Subtotally occluded PDA, mild diffuse disease in the right coronary artery. Moderate disease in the proximal LAD - 40% and first diagonal branch large with 80% to 90% stenosis in mid segment. LVEF 45-50% with inferior severe hypokinesis. No significant mitral regurgitation. Diffuse coronary calcification.  Abdominal aortic duplex 04/06/2017: Normal abdominal aorta. No evidence of aneurysm.  Lexiscan myoview stress test 03/30/2018: 1. Lexiscan stress test was performed. Exercise capacity was not assessed. Stress symptoms included dyspnea. Peak effect blood pressure was 174/64 mmHg. Stress EKG is non diagnostic for ischemia as it is a pharmacologic stress. In addition, stress  electrocardiogram showed sinus tachycardia, incomplete RBBB, no stress arrhythmias and normal stress repolarization. 2. The overall quality of the study is fair. Left ventricular cavity is noted to be normal on the rest and stress studies. Gated SPECT images reveal normal myocardial thickening and wall motion. The left ventricular ejection fraction was calculated 41%, although visually appears normal. Small sized, mild intensity, reversible perfusion defect in inferior apical myocardium likely represents ischemia. In addition, there is basal inferior myocardium defect seen worse on rest images, which likely represents  tissue attenuation artifact. Recommend clinical correlation of ejection fraction with echocardiogram. 3. Intermediate risk study.  Echocardiogram 04/13/2018: 1. Left ventricle cavity is normal in size. Mild concentric hypertrophy of the left ventricle. Normal global wall motion. Visual EF is 60-65%. Doppler evidence of grade I (impaired) diastolic dysfunction, elevated LAP. 2. Trileaflet aortic valve. Mild calcification of the aortic valve annulus. Mild aortic valve leaflet calcification. 3. Mild (Grade I) mitral regurgitation. Mild calcification of the mitral valve annulus. 4. Mild tricuspid regurgitation. No evidence of pulmonary hypertension.  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 Duplex suggests stenosis in the left internal carotid artery (16-49%). Right vertebral artery flow is not visualized. Antegrade left vertebral artery flow. Follow up in six months is appropriate if clinically indicated.  EKG  Labs 03/17/2021: Normal sinus rhythm at rate of 69 bpm, left atrial enlargement, left axis deviation, left anterior fascicular block.  Incomplete right bundle branch block.  No evidence of ischemia.  Compared to 09/15/2019, inferior nonspecific T wave inversions not present.   Assessment     ICD-10-CM   1.  Essential hypertension  I10 cloNIDine (CATAPRES - DOSED IN MG/24 HR) 0.3 mg/24hr patch    spironolactone (ALDACTONE) 25 MG tablet    cloNIDine (CATAPRES - DOSED IN MG/24 HR) 0.3 mg/24hr patch    2. Coronary artery disease involving native coronary artery of native heart without angina pectoris  I25.10     3. Asymptomatic bilateral carotid artery stenosis  I65.23     4. Mixed hyperlipidemia  E78.2        Meds ordered this encounter  Medications   cloNIDine (CATAPRES - DOSED IN MG/24 HR) 0.3 mg/24hr patch    Sig: Place 1 patch (0.3 mg total) onto the skin once a week.    Dispense:  13 patch    Refill:  3   spironolactone (ALDACTONE) 25 MG tablet    Sig: Take 1 tablet (25 mg total) by mouth daily.    Dispense:  100 tablet    Refill:  3   cloNIDine (CATAPRES - DOSED IN MG/24 HR) 0.3 mg/24hr patch    Sig: Place 1 patch (0.3 mg total) onto the skin once a week.    Dispense:  4 patch    Refill:  0   ezetimibe (ZETIA) 10 MG tablet    Sig: Take 1 tablet (10 mg total) by mouth daily.    Dispense:  30 tablet    Refill:  2     Medications Discontinued During This Encounter  Medication Reason   spironolactone (ALDACTONE) 25 MG tablet Reorder   cloNIDine (CATAPRES - DOSED IN MG/24 HR) 0.2 mg/24hr patch Reorder     Recommendations:   Ronald [name]  is a 75 y.o. male  with HTN, Hyperlipidemia, admitted on 01/14/2014 with non-ST elevation myocardial infarction. Angiogram revealed subtotally occluded but diffusely diseased PDA branch of the right coronary artery which was left alone, moderate diffuse RCA disease and high grade D1 disease.   I had seen him about a month ago for worsening diarrhea and he had discontinued all his cardiac medications including beta-blockers.  I had started him on clonidine and in view of elevated blood pressure also start him on spironolactone few days later which he is tolerating without any side effects.  As his blood pressure is still elevated, will  increase clonidine patch to 0.3 mg patch q. 1 week.  Prescription long-term was sent to [facility] and also to local pharmacy for 1 month supply.  I discussed the findings of the carotid artery duplex.  In view of underlying coronary artery disease and now new diagnosis of carotid stenosis, goal LDL is definitely <70 if not closer to 55.  He is willing to try Zetia.  He is planning to make a visit at the [facility] system for his routine annual visit sometime in February, will obtain labs from there and I would like to see him back after that to close the loop and to follow-up on hypertension and hyperlipidemia.  On his next office visit I will also order repeat carotid artery duplex for surveillance at 6 months from now.  All questions answered.  Wife is present.     Adrian Prows, MD, [facility] 04/18/2021, 1:56 PM Office: [phone] Pager: [phone]

## 2021-05-12 ENCOUNTER — Other Ambulatory Visit: Payer: Self-pay | Admitting: Cardiology

## 2021-05-12 DIAGNOSIS — I10 Essential (primary) hypertension: Secondary | ICD-10-CM

## 2021-05-14 ENCOUNTER — Telehealth: Payer: Self-pay

## 2021-05-14 NOTE — Telephone Encounter (Signed)
Patient called and said he is having an issue with the clonidine patches irritating him and said he cannot wear them anymore

## 2021-05-14 NOTE — Telephone Encounter (Signed)
Ask him if he wants to take oral pills for the same dose 0.2 mg twice daily

## 2021-05-15 ENCOUNTER — Other Ambulatory Visit: Payer: Self-pay

## 2021-05-15 MED ORDER — CLONIDINE HCL 0.2 MG PO TABS: 0.2000 mg | ORAL_TABLET | Freq: Two times a day (BID) | ORAL | 1 refills | Status: DC

## 2021-05-15 NOTE — Telephone Encounter (Signed)
Hope you sent 90 days with 3 refills

## 2021-05-15 NOTE — Telephone Encounter (Signed)
Patient agreed to take oral meds. I sent them in.

## 2021-05-16 ENCOUNTER — Other Ambulatory Visit: Payer: Self-pay

## 2021-05-16 MED ORDER — EZETIMIBE 10 MG PO TABS
10.0000 mg | ORAL_TABLET | Freq: Every day | ORAL | 0 refills | Status: DC
Start: 2021-05-16 — End: 2021-07-18

## 2021-07-18 ENCOUNTER — Encounter: Payer: Self-pay | Admitting: Cardiology

## 2021-07-18 ENCOUNTER — Ambulatory Visit: Payer: Medicare Other | Admitting: Cardiology

## 2021-07-18 ENCOUNTER — Other Ambulatory Visit: Payer: Self-pay

## 2021-07-18 VITALS — BP 144/78 | HR 77 | Temp 98.0°F | Resp 16 | Ht 68.0 in | Wt 171.0 lb

## 2021-07-18 DIAGNOSIS — I10 Essential (primary) hypertension: Secondary | ICD-10-CM

## 2021-07-18 DIAGNOSIS — I6523 Occlusion and stenosis of bilateral carotid arteries: Secondary | ICD-10-CM

## 2021-07-18 DIAGNOSIS — E782 Mixed hyperlipidemia: Secondary | ICD-10-CM

## 2021-07-18 DIAGNOSIS — I2510 Atherosclerotic heart disease of native coronary artery without angina pectoris: Secondary | ICD-10-CM

## 2021-07-18 MED ORDER — CLONIDINE HCL 0.3 MG PO TABS: 0.3000 mg | ORAL_TABLET | Freq: Two times a day (BID) | ORAL | 3 refills | Status: DC

## 2021-07-18 MED ORDER — EZETIMIBE 10 MG PO TABS: 10.0000 mg | ORAL_TABLET | Freq: Every day | ORAL | 3 refills | Status: DC

## 2021-07-18 NOTE — Progress Notes (Signed)
? ?Primary Physician/Referring:  Greta Doom, MD ? ?Patient ID: Ronald [name], male    DOB: 1946-03-15, 76 y.o.   MRN: [id number] ? ?Chief Complaint  ?Patient presents with  ? Hypertension  ? Follow-up  ?  3 month  ? ?HPI:   ? ?Ronald [name]  is a 76 y.o. male   with HTN, Hyperlipidemia, admitted on 01/14/2014 with non-ST elevation myocardial infarction. Angiogram revealed subtotally occluded but diffusely diseased PDA branch of the right coronary artery which was left alone, moderate diffuse RCA disease and high grade D1 disease.  Past medical history significant for hypercholesterolemia, hypertension and multiple medication allergies and intolerances.  He also smokes cigars. ? ?Today he is asymptomatic.  ? ?Past Medical History:  ?Diagnosis Date  ? Bronchitis   ? Hypertension   ? Myocardial infarction [facility])   ? ?Past Surgical History:  ?Procedure Laterality Date  ? LEFT HEART CATHETERIZATION WITH CORONARY ANGIOGRAM N/A 01/15/2014  ? Procedure: LEFT HEART CATHETERIZATION WITH CORONARY ANGIOGRAM;  Surgeon: Micheline Chapman, MD;  Location: [facility] CATH LAB;  Service: Cardiovascular;  Laterality: N/A;  ? ?History reviewed. No pertinent family history.  ?Social History  ? ?Tobacco Use  ? Smoking status: Every Day  ?  Types: Cigars  ? Smokeless tobacco: Never  ?Substance Use Topics  ? Alcohol use: Yes  ?  Comment: beer occ  ? ?Marital Status: Married  ?ROS  ?Review of Systems  ?Cardiovascular:  Negative for chest pain, dyspnea on exertion and leg swelling.  ?Objective  ?Blood pressure (!) 144/78, pulse 77, temperature 98 ?F (36.7 ?C), resp. rate 16, height 5\' 8"  (1.727 m), weight 171 lb (77.6 kg), SpO2 100 %.  ?Vitals with BMI 07/18/2021 04/18/2021 03/17/2021  ?Height 5\' 8"  5\' 8"  -  ?Weight 171 lbs 171 lbs -  ?BMI 26.01 26.01 -  ?Systolic 144 146 03/19/2021  ?Diastolic 78 81 87  ?Pulse 77 96 71  ?  ? Physical Exam ?Constitutional:   ?   Appearance: He is well-developed.  ?Neck:  ?   Vascular: Carotid bruit (right) present. No  JVD.  ?Cardiovascular:  ?   Rate and Rhythm: Normal rate and regular rhythm.  ?   Pulses: Normal pulses and intact distal pulses.  ?   Heart sounds: No murmur heard. ?  No gallop.  ?Pulmonary:  ?   Effort: Pulmonary effort is normal. No accessory muscle usage or respiratory distress.  ?   Breath sounds: Normal breath sounds.  ?Abdominal:  ?   General: Bowel sounds are normal.  ?   Palpations: Abdomen is soft.  ?   Hernia: A hernia (reducible) is present. Hernia is present in the ventral area.  ?Musculoskeletal:  ?   Right lower leg: No edema.  ?   Left lower leg: No edema.  ? ?Laboratory examination:  ? ?Recent Labs  ?  03/12/21 ?  ?NA 144  ?K 4.3  ?CL 107*  ?CO2 23  ?GLUCOSE 105*  ?BUN 9  ?CREATININE 1.05  ?CALCIUM 8.6  ? ?CrCl cannot be calculated (Patient's most recent lab result is older than the maximum 21 days allowed.).  ?CMP Latest Ref Rng & Units 03/12/2021 02/10/2019 12/06/2015  ?Glucose 70 - 99 mg/dL 13/01/2021) 04/12/2019) 02/05/2016)  ?BUN 8 - 27 mg/dL 9 14 13   ?Creatinine 0.76 - 1.27 mg/dL 150(V 697(X 480(X  ?Sodium 134 - 144 mmol/L 144 134 129(L)  ?Potassium 3.5 - 5.2 mmol/L 4.3 4.8 4.1  ?Chloride 96 - 106 mmol/L 107(H)  101 98(L)  ?CO2 20 - 29 mmol/L 23 21 23   ?Calcium 8.6 - 10.2 mg/dL 8.6 9.1 8.9  ?Total Protein 6.0 - 8.5 g/dL 6.3 6.6 7.8  ?Total Bilirubin 0.0 - 1.2 mg/dL 0.4 0.5 1.1  ?Alkaline Phos 44 - 121 IU/L 60 57 56  ?AST 0 - 40 IU/L 21 20 37  ?ALT 0 - 44 IU/L 20 21 27   ? ?CBC Latest Ref Rng & Units 03/12/2021 02/10/2019 12/06/2015  ?WBC 3.4 - 10.8 x10E3/uL 6.1 6.6 14.9(H)  ?Hemoglobin 13.0 - 17.7 g/dL 12.8(L) 12.7(L) 14.1  ?Hematocrit 37.5 - 51.0 % 37.5 37.2(L) 39.3  ?Platelets 150 - 450 x10E3/uL 205 242 236  ? ?Lipid Panel ?Recent Labs  ?  03/12/21 ?0815  ?CHOL 162  ?TRIG 88  ?LDLCALC 93  ?HDL 53  ?HEMOGLOBIN A1C ?Lab Results  ?Component Value Date  ? HGBA1C 5.6 01/14/2014  ? MPG 114 01/14/2014  ? ?TSH ?No results for input(s): TSH in the last 8760 hours. ? ?External labs:  ?  ?TSH 1.770 UIU/  03/30/2018 ? ?Medications and allergies  ? ?Allergies  ?Allergen Reactions  ? Angiotensin Receptor Blockers Diarrhea  ? Beta Adrenergic Blockers Other (See Comments)  ?  Fatigue  ? Lisinopril Rash  ?  ? ?Current Outpatient Medications  ?Medication Instructions  ? aspirin 81 mg, Oral, Daily  ? cloNIDine (CATAPRES) 0.3 mg, Oral, 2 times daily  ? ezetimibe (ZETIA) 10 mg, Oral, Daily  ? nitroGLYCERIN (NITROSTAT) 0.4 mg, Sublingual, Every 5 min x3 PRN  ? omeprazole (PRILOSEC OTC) 20 mg, Oral, Daily  ? simvastatin (ZOCOR) 40 mg, Oral, Daily  ? spironolactone (ALDACTONE) 25 mg, Oral, Daily  ? ?Radiology:  ? ?No results found. ? ?Cardiac Studies:  ? ?Coronary angiogram 01/15/2014: Subtotally occluded PDA, mild diffuse disease in the right coronary artery. Moderate disease in the proximal LAD - 40% and first diagonal branch large with 80% to 90% stenosis in mid segment. LVEF 45-50% with inferior severe hypokinesis. No significant mitral regurgitation. Diffuse coronary calcification. ? ?Abdominal aortic duplex 04/06/2017: ?Normal abdominal aorta. No evidence of aneurysm. ? ?Lexiscan myoview stress test 03/30/2018: ?1. Lexiscan stress test was performed. Exercise capacity was not assessed. Stress symptoms included dyspnea. Peak effect blood pressure was 174/64 mmHg. Stress EKG is non diagnostic for ischemia as it is a pharmacologic stress. In addition, stress electrocardiogram showed sinus tachycardia, incomplete RBBB, no stress arrhythmias and normal stress repolarization. ?2. The overall quality of the study is fair. Left ventricular cavity is noted to be normal on the rest and stress studies. Gated SPECT images reveal normal myocardial thickening and wall motion. The left ventricular ejection fraction was calculated 41%, although visually appears normal. Small sized, mild intensity, reversible perfusion defect in inferior apical myocardium likely represents ischemia. In addition, there is basal inferior myocardium defect  seen worse on rest images, which likely represents tissue attenuation artifact. Recommend clinical correlation of ejection fraction with echocardiogram. ?3. Intermediate risk study. ? ?Echocardiogram 04/13/2018: ?1. Left ventricle cavity is normal in size. Mild concentric hypertrophy of the left ventricle. Normal global wall motion. Visual EF is 60-65%. Doppler evidence of grade I (impaired) diastolic dysfunction, elevated LAP. ?2. Trileaflet aortic valve. Mild calcification of the aortic valve annulus. Mild aortic valve leaflet calcification. ?3. Mild (Grade I) mitral regurgitation. Mild calcification of the mitral valve annulus. ?4. Mild tricuspid regurgitation. No evidence of pulmonary hypertension. ? ?Carotid artery duplex 03/20/2021:  ?Duplex suggests stenosis in the right internal carotid artery (50-69%). ?Duplex suggests stenosis in  the left internal carotid artery (16-49%). ?Right vertebral artery flow is not visualized. Antegrade left vertebral artery flow. ?Follow up in six months is appropriate if clinically indicated. ? ?EKG ? ?Labs 03/17/2021: Normal sinus rhythm at rate of 69 bpm, left atrial enlargement, left axis deviation, left anterior fascicular block.  Incomplete right bundle branch block.  No evidence of ischemia.  Compared to 09/15/2019, inferior nonspecific T wave inversions not present.  ? ?Assessment  ? ?  ICD-10-CM   ?1. Essential hypertension  I10 cloNIDine (CATAPRES) 0.3 MG tablet  ?  ?2. Mixed hyperlipidemia  E78.2 ezetimibe (ZETIA) 10 MG tablet  ?  Lipid Panel With LDL/HDL Ratio  ?  ?3. Coronary artery disease involving native coronary artery of native heart without angina pectoris  I25.10   ?  ?4. Asymptomatic bilateral carotid artery stenosis  I65.23 PCV CAROTID DUPLEX (BILATERAL)  ?  ?  ?Meds ordered this encounter  ?Medications  ? cloNIDine (CATAPRES) 0.3 MG tablet  ?  Sig: Take 1 tablet (0.3 mg total) by mouth 2 (two) times daily.  ?  Dispense:  200 tablet  ?  Refill:  3  ?   Discontinue patches  ? ezetimibe (ZETIA) 10 MG tablet  ?  Sig: Take 1 tablet (10 mg total) by mouth daily.  ?  Dispense:  100 tablet  ?  Refill:  3  ?  ?Medications Discontinued During This Encounter  ?Medication Reason  ? cloNIDin

## 2021-07-26 LAB — LIPID PANEL WITH LDL/HDL RATIO
Cholesterol, Total: 116 mg/dL (ref 100–199)
HDL: 61 mg/dL (ref >39)
LDL Chol Calc (NIH): 42 mg/dL (ref 0–99)
LDL/HDL Ratio: 0.7 ratio (ref 0.0–3.6)
Triglycerides: 59 mg/dL (ref 0–149)
VLDL Cholesterol Cal: 13 mg/dL (ref 5–40)

## 2021-09-08 ENCOUNTER — Other Ambulatory Visit: Payer: Self-pay | Admitting: Cardiology

## 2021-10-03 DIAGNOSIS — R3912 Poor urinary stream: Secondary | ICD-10-CM | POA: Diagnosis not present

## 2021-10-03 DIAGNOSIS — R3914 Feeling of incomplete bladder emptying: Secondary | ICD-10-CM | POA: Diagnosis not present

## 2021-10-03 DIAGNOSIS — N401 Enlarged prostate with lower urinary tract symptoms: Secondary | ICD-10-CM | POA: Diagnosis not present

## 2021-10-03 DIAGNOSIS — R350 Frequency of micturition: Secondary | ICD-10-CM | POA: Diagnosis not present

## 2021-10-03 DIAGNOSIS — R351 Nocturia: Secondary | ICD-10-CM | POA: Diagnosis not present

## 2021-10-10 ENCOUNTER — Telehealth: Payer: Self-pay

## 2021-10-10 NOTE — Telephone Encounter (Signed)
Called pts wife, no answer. Left vm requesting call back.

## 2021-10-10 NOTE — Telephone Encounter (Signed)
They can take 0.2 mg twice daily for now and see if the symptoms get better and let us know in a week

## 2021-10-10 NOTE — Telephone Encounter (Signed)
Pts wife called and stated that the last time he was here we raised the dose on his clonidine from 0.2 mg to 0.3 mg. His legs have been cramping since starting the new dose and it has gotten worse. She was doing the 0.2 mg in the day and 0.3 mg at night. They are not sure if this is because the medication may be different from the [place] compared to other pharmacies. Please advise.

## 2021-10-14 ENCOUNTER — Other Ambulatory Visit: Payer: Self-pay

## 2021-10-14 MED ORDER — CLONIDINE HCL 0.2 MG PO TABS: 0.2000 mg | ORAL_TABLET | Freq: Two times a day (BID) | ORAL | 1 refills | Status: DC

## 2021-10-14 NOTE — Telephone Encounter (Signed)
Called and spoke to pt, pt requested refill for 0.2 mg tablets. Pt agreed to take 0.2 mg and call us back in a week.

## 2021-11-19 ENCOUNTER — Other Ambulatory Visit: Payer: Self-pay

## 2021-11-19 ENCOUNTER — Other Ambulatory Visit (HOSPITAL_COMMUNITY): Payer: Non-veteran care

## 2021-11-19 ENCOUNTER — Encounter (HOSPITAL_COMMUNITY)
Admission: EM | Disposition: A | Discharge status: Home / Self Care | Payer: Self-pay | Source: Home / Self Care | Attending: Cardiology

## 2021-11-19 ENCOUNTER — Emergency Department (HOSPITAL_COMMUNITY): Payer: No Typology Code available for payment source

## 2021-11-19 ENCOUNTER — Encounter (HOSPITAL_COMMUNITY): Payer: Self-pay | Admitting: Emergency Medicine

## 2021-11-19 ENCOUNTER — Inpatient Hospital Stay (HOSPITAL_COMMUNITY)
Admission: EM | Admit: 2021-11-19 | Discharge: 2021-11-20 | DRG: 282 | Disposition: A | Discharge status: Home / Self Care | Payer: No Typology Code available for payment source | Attending: Cardiology | Admitting: Cardiology

## 2021-11-19 DIAGNOSIS — E785 Hyperlipidemia, unspecified: Secondary | ICD-10-CM | POA: Diagnosis not present

## 2021-11-19 DIAGNOSIS — I700 Atherosclerosis of aorta: Secondary | ICD-10-CM | POA: Diagnosis present

## 2021-11-19 DIAGNOSIS — I6523 Occlusion and stenosis of bilateral carotid arteries: Secondary | ICD-10-CM | POA: Diagnosis not present

## 2021-11-19 DIAGNOSIS — I25119 Atherosclerotic heart disease of native coronary artery with unspecified angina pectoris: Secondary | ICD-10-CM | POA: Diagnosis not present

## 2021-11-19 DIAGNOSIS — F17290 Nicotine dependence, other tobacco product, uncomplicated: Secondary | ICD-10-CM | POA: Diagnosis present

## 2021-11-19 DIAGNOSIS — I10 Essential (primary) hypertension: Secondary | ICD-10-CM | POA: Diagnosis present

## 2021-11-19 DIAGNOSIS — Z888 Allergy status to other drugs, medicaments and biological substances status: Secondary | ICD-10-CM

## 2021-11-19 DIAGNOSIS — I3481 Nonrheumatic mitral (valve) annulus calcification: Secondary | ICD-10-CM | POA: Diagnosis not present

## 2021-11-19 DIAGNOSIS — I252 Old myocardial infarction: Secondary | ICD-10-CM

## 2021-11-19 DIAGNOSIS — I25110 Atherosclerotic heart disease of native coronary artery with unstable angina pectoris: Secondary | ICD-10-CM | POA: Diagnosis present

## 2021-11-19 DIAGNOSIS — Z789 Other specified health status: Secondary | ICD-10-CM | POA: Diagnosis not present

## 2021-11-19 DIAGNOSIS — Z79899 Other long term (current) drug therapy: Secondary | ICD-10-CM

## 2021-11-19 DIAGNOSIS — I214 Non-ST elevation (NSTEMI) myocardial infarction: Secondary | ICD-10-CM | POA: Diagnosis not present

## 2021-11-19 DIAGNOSIS — Z7982 Long term (current) use of aspirin: Secondary | ICD-10-CM | POA: Diagnosis not present

## 2021-11-19 DIAGNOSIS — R7989 Other specified abnormal findings of blood chemistry: Secondary | ICD-10-CM | POA: Diagnosis not present

## 2021-11-19 DIAGNOSIS — I2510 Atherosclerotic heart disease of native coronary artery without angina pectoris: Secondary | ICD-10-CM | POA: Diagnosis not present

## 2021-11-19 HISTORY — PX: LEFT HEART CATH AND CORONARY ANGIOGRAPHY: CATH118249

## 2021-11-19 LAB — BASIC METABOLIC PANEL
Anion gap: 10 (ref 5–15)
BUN: 13 mg/dL (ref 8–23)
CO2: 22 mmol/L (ref 22–32)
Calcium: 9.4 mg/dL (ref 8.9–10.3)
Chloride: 102 mmol/L (ref 98–111)
Creatinine, Ser: 1.1 mg/dL (ref 0.61–1.24)
GFR, Estimated: >60 mL/min (ref >60)
Glucose, Bld: 136 mg/dL — ABNORMAL HIGH (ref 70–99)
Potassium: 4.1 mmol/L (ref 3.5–5.1)
Sodium: 134 mmol/L — ABNORMAL LOW (ref 135–145)

## 2021-11-19 LAB — HEMOGLOBIN A1C
Hgb A1c MFr Bld: 5.9 % — ABNORMAL HIGH (ref 4.8–5.6)
Mean Plasma Glucose: 122.63 mg/dL

## 2021-11-19 LAB — CBC
HCT: 38 % — ABNORMAL LOW (ref 39.0–52.0)
Hemoglobin: 13.4 g/dL (ref 13.0–17.0)
MCH: 33 pg (ref 26.0–34.0)
MCHC: 35.3 g/dL (ref 30.0–36.0)
MCV: 93.6 fL (ref 80.0–100.0)
Platelets: 208 10*3/uL (ref 150–400)
RBC: 4.06 MIL/uL — ABNORMAL LOW (ref 4.22–5.81)
RDW: 12 % (ref 11.5–15.5)
WBC: 6.7 10*3/uL (ref 4.0–10.5)
nRBC: 0 % (ref 0.0–0.2)

## 2021-11-19 LAB — POCT ACTIVATED CLOTTING TIME: Activated Clotting Time: 299 seconds

## 2021-11-19 LAB — TROPONIN I (HIGH SENSITIVITY)
Troponin I (High Sensitivity): 232 ng/L (ref <18)
Troponin I (High Sensitivity): 279 ng/L (ref <18)
Troponin I (High Sensitivity): 393 ng/L (ref <18)
Troponin I (High Sensitivity): 604 ng/L (ref <18)

## 2021-11-19 LAB — BRAIN NATRIURETIC PEPTIDE: B Natriuretic Peptide: 59.8 pg/mL (ref 0.0–100.0)

## 2021-11-19 LAB — TSH: TSH: 1.281 u[IU]/mL (ref 0.350–4.500)

## 2021-11-19 SURGERY — LEFT HEART CATH AND CORONARY ANGIOGRAPHY
Anesthesia: LOCAL

## 2021-11-19 MED ORDER — NITROGLYCERIN 0.4 MG SL SUBL: 0.4000 mg | SUBLINGUAL_TABLET | SUBLINGUAL | Status: DC | PRN

## 2021-11-19 MED ORDER — ONDANSETRON HCL 4 MG/2ML IJ SOLN: 4.0000 mg | Freq: Four times a day (QID) | INTRAMUSCULAR | Status: DC | PRN

## 2021-11-19 MED ORDER — EZETIMIBE 10 MG PO TABS
10.0000 mg | ORAL_TABLET | Freq: Every day | ORAL | Status: DC
  Administered 2021-11-20: 10 mg via ORAL
  Filled 2021-11-19: qty 1

## 2021-11-19 MED ORDER — HEPARIN SODIUM (PORCINE) 1000 UNIT/ML IJ SOLN
INTRAMUSCULAR | Status: DC | PRN
  Administered 2021-11-19 (×2): 4000 [IU] via INTRAVENOUS

## 2021-11-19 MED ORDER — ISOSORBIDE MONONITRATE ER 30 MG PO TB24
30.0000 mg | ORAL_TABLET | Freq: Every day | ORAL | Status: DC
Start: 2021-11-19 — End: 2021-11-20
  Administered 2021-11-19 – 2021-11-20 (×2): 30 mg via ORAL
  Filled 2021-11-19 (×2): qty 1

## 2021-11-19 MED ORDER — MIDAZOLAM HCL 2 MG/2ML IJ SOLN
INTRAMUSCULAR | Status: DC | PRN
  Administered 2021-11-19: 1 mg via INTRAVENOUS

## 2021-11-19 MED ORDER — LIDOCAINE HCL (PF) 1 % IJ SOLN
INTRAMUSCULAR | Status: AC
  Filled 2021-11-19: qty 30

## 2021-11-19 MED ORDER — NITROGLYCERIN 0.4 MG SL SUBL
0.4000 mg | SUBLINGUAL_TABLET | SUBLINGUAL | Status: DC | PRN
Start: 2021-11-19 — End: 2021-11-20

## 2021-11-19 MED ORDER — TAMSULOSIN HCL 0.4 MG PO CAPS
0.4000 mg | ORAL_CAPSULE | Freq: Every day | ORAL | Status: DC
  Administered 2021-11-20: 0.4 mg via ORAL
  Filled 2021-11-19: qty 1

## 2021-11-19 MED ORDER — ACETAMINOPHEN 325 MG PO TABS
650.0000 mg | ORAL_TABLET | ORAL | Status: DC | PRN
  Administered 2021-11-19: 650 mg via ORAL
  Filled 2021-11-19: qty 2

## 2021-11-19 MED ORDER — LABETALOL HCL 5 MG/ML IV SOLN: 10.0000 mg | INTRAVENOUS | Status: AC | PRN

## 2021-11-19 MED ORDER — MIDAZOLAM HCL 2 MG/2ML IJ SOLN
INTRAMUSCULAR | Status: AC
  Filled 2021-11-19: qty 2

## 2021-11-19 MED ORDER — SODIUM CHLORIDE 0.9 % IV SOLN: 250.0000 mL | INTRAVENOUS | Status: DC | PRN

## 2021-11-19 MED ORDER — VERAPAMIL HCL 2.5 MG/ML IV SOLN
INTRAVENOUS | Status: AC
  Filled 2021-11-19: qty 2

## 2021-11-19 MED ORDER — CLONIDINE HCL 0.2 MG PO TABS
0.2000 mg | ORAL_TABLET | Freq: Two times a day (BID) | ORAL | Status: DC
  Administered 2021-11-19 – 2021-11-20 (×2): 0.2 mg via ORAL
  Filled 2021-11-19 (×2): qty 1

## 2021-11-19 MED ORDER — SODIUM CHLORIDE 0.9 % IV SOLN
INTRAVENOUS | Status: AC
Start: 2021-11-19 — End: 2021-11-19

## 2021-11-19 MED ORDER — ASPIRIN 81 MG PO TBEC
81.0000 mg | DELAYED_RELEASE_TABLET | Freq: Every day | ORAL | Status: DC
  Administered 2021-11-20: 81 mg via ORAL
  Filled 2021-11-19: qty 1

## 2021-11-19 MED ORDER — HEPARIN BOLUS VIA INFUSION
4000.0000 [IU] | Freq: Once | INTRAVENOUS | Status: AC
  Administered 2021-11-19: 4000 [IU] via INTRAVENOUS
  Filled 2021-11-19: qty 4000

## 2021-11-19 MED ORDER — ASPIRIN 300 MG RE SUPP: 300.0000 mg | RECTAL | Status: DC

## 2021-11-19 MED ORDER — IOHEXOL 350 MG/ML SOLN
INTRAVENOUS | Status: DC | PRN
  Administered 2021-11-19: 100 mL

## 2021-11-19 MED ORDER — FENTANYL CITRATE (PF) 100 MCG/2ML IJ SOLN
INTRAMUSCULAR | Status: AC
  Filled 2021-11-19: qty 2

## 2021-11-19 MED ORDER — HEPARIN SODIUM (PORCINE) 5000 UNIT/ML IJ SOLN
5000.0000 [IU] | Freq: Three times a day (TID) | INTRAMUSCULAR | Status: DC
  Administered 2021-11-20: 5000 [IU] via SUBCUTANEOUS
  Filled 2021-11-19: qty 1

## 2021-11-19 MED ORDER — SODIUM CHLORIDE 0.9% FLUSH: 3.0000 mL | INTRAVENOUS | Status: DC | PRN

## 2021-11-19 MED ORDER — HYDRALAZINE HCL 20 MG/ML IJ SOLN: 10.0000 mg | INTRAMUSCULAR | Status: AC | PRN

## 2021-11-19 MED ORDER — HEPARIN (PORCINE) IN NACL 1000-0.9 UT/500ML-% IV SOLN
INTRAVENOUS | Status: AC
  Filled 2021-11-19: qty 1000

## 2021-11-19 MED ORDER — HEPARIN (PORCINE) IN NACL 1000-0.9 UT/500ML-% IV SOLN
INTRAVENOUS | Status: DC | PRN
  Administered 2021-11-19 (×2): 500 mL

## 2021-11-19 MED ORDER — CLOPIDOGREL BISULFATE 75 MG PO TABS
75.0000 mg | ORAL_TABLET | Freq: Every day | ORAL | Status: DC
  Administered 2021-11-20: 75 mg via ORAL
  Filled 2021-11-19: qty 1

## 2021-11-19 MED ORDER — PANTOPRAZOLE SODIUM 40 MG PO TBEC
40.0000 mg | DELAYED_RELEASE_TABLET | Freq: Every day | ORAL | Status: DC
  Administered 2021-11-20: 40 mg via ORAL
  Filled 2021-11-19: qty 1

## 2021-11-19 MED ORDER — NITROGLYCERIN IN D5W 200-5 MCG/ML-% IV SOLN
0.0000 ug/min | INTRAVENOUS | Status: DC
  Administered 2021-11-19: 5 ug/min via INTRAVENOUS
  Filled 2021-11-19: qty 250

## 2021-11-19 MED ORDER — ASPIRIN 325 MG PO TABS
325.0000 mg | ORAL_TABLET | Freq: Every day | ORAL | Status: DC
  Administered 2021-11-19: 325 mg via ORAL
  Filled 2021-11-19: qty 1

## 2021-11-19 MED ORDER — ACETAMINOPHEN 325 MG PO TABS
650.0000 mg | ORAL_TABLET | ORAL | Status: DC | PRN
Start: 2021-11-19 — End: 2021-11-19

## 2021-11-19 MED ORDER — ATORVASTATIN CALCIUM 80 MG PO TABS
80.0000 mg | ORAL_TABLET | Freq: Every day | ORAL | Status: DC
Start: 2021-11-19 — End: 2021-11-20
  Administered 2021-11-20: 80 mg via ORAL
  Filled 2021-11-19: qty 1

## 2021-11-19 MED ORDER — LIDOCAINE HCL (PF) 1 % IJ SOLN
INTRAMUSCULAR | Status: DC | PRN
  Administered 2021-11-19: 2 mL via INTRADERMAL

## 2021-11-19 MED ORDER — AMLODIPINE BESYLATE 5 MG PO TABS
5.0000 mg | ORAL_TABLET | Freq: Every day | ORAL | Status: DC
  Administered 2021-11-19 – 2021-11-20 (×2): 5 mg via ORAL
  Filled 2021-11-19 (×2): qty 1

## 2021-11-19 MED ORDER — FENTANYL CITRATE (PF) 100 MCG/2ML IJ SOLN
INTRAMUSCULAR | Status: DC | PRN
  Administered 2021-11-19: 25 ug via INTRAVENOUS

## 2021-11-19 MED ORDER — SIMVASTATIN 20 MG PO TABS: 40.0000 mg | ORAL_TABLET | Freq: Every day | ORAL | Status: DC

## 2021-11-19 MED ORDER — VERAPAMIL HCL 2.5 MG/ML IV SOLN
INTRAVENOUS | Status: DC | PRN
  Administered 2021-11-19 (×2): 10 mL via INTRA_ARTERIAL

## 2021-11-19 MED ORDER — SPIRONOLACTONE 25 MG PO TABS
25.0000 mg | ORAL_TABLET | Freq: Every day | ORAL | Status: DC
  Administered 2021-11-20: 25 mg via ORAL
  Filled 2021-11-19: qty 1

## 2021-11-19 MED ORDER — HEPARIN SODIUM (PORCINE) 1000 UNIT/ML IJ SOLN
INTRAMUSCULAR | Status: AC
  Filled 2021-11-19: qty 10

## 2021-11-19 MED ORDER — SODIUM CHLORIDE 0.9% FLUSH
3.0000 mL | Freq: Two times a day (BID) | INTRAVENOUS | Status: DC
  Administered 2021-11-19 – 2021-11-20 (×2): 3 mL via INTRAVENOUS

## 2021-11-19 MED ORDER — ASPIRIN 81 MG PO CHEW: 324.0000 mg | CHEWABLE_TABLET | ORAL | Status: DC

## 2021-11-19 MED ORDER — HEPARIN (PORCINE) 25000 UT/250ML-% IV SOLN
950.0000 [IU]/h | INTRAVENOUS | Status: DC
  Administered 2021-11-19: 950 [IU]/h via INTRAVENOUS
  Filled 2021-11-19: qty 250

## 2021-11-19 SURGICAL SUPPLY — 19 items
BAND ZEPHYR COMPRESS 30 LONG (HEMOSTASIS) ×1 IMPLANT
CATH 5FR JL4 DIAGNOSTIC (CATHETERS) ×1 IMPLANT
CATH INFINITI JR4 5F (CATHETERS) ×1 IMPLANT
CATH LAUNCHER 6FR AL1 (CATHETERS) IMPLANT
CATH LAUNCHER 6FR EBU 3.75 (CATHETERS) ×1 IMPLANT
CATH LAUNCHER 6FR EBU3.5 (CATHETERS) ×1 IMPLANT
CATH LAUNCHER 6FR JL4 (CATHETERS) ×1 IMPLANT
CATH OPTITORQUE TIG 4.0 5F (CATHETERS) ×1 IMPLANT
CATHETER LAUNCHER 6FR AL1 (CATHETERS) ×2
ELECT DEFIB PAD ADLT CADENCE (PAD) ×1 IMPLANT
GLIDESHEATH SLEND A-KIT 6F 22G (SHEATH) ×1 IMPLANT
GUIDEWIRE INQWIRE 1.5J.035X260 (WIRE) IMPLANT
INQWIRE 1.5J .035X260CM (WIRE) ×2
KIT ENCORE 26 ADVANTAGE (KITS) ×1 IMPLANT
KIT HEART LEFT (KITS) ×2 IMPLANT
PACK CARDIAC CATHETERIZATION (CUSTOM PROCEDURE TRAY) ×2 IMPLANT
TRANSDUCER W/STOPCOCK (MISCELLANEOUS) ×2 IMPLANT
TUBING CIL FLEX 10 FLL-RA (TUBING) ×2 IMPLANT
VALVE GUARDIAN II ~~LOC~~ HEMO (MISCELLANEOUS) ×1 IMPLANT

## 2021-11-19 NOTE — ED Provider Triage Note (Signed)
Emergency Medicine Provider Triage Evaluation Note  Ronald [name] , a 76 y.o. male  was evaluated in triage.  Pt complains of chest pain.  Is in the middle of his chest, is been coming and going since Sunday night.  Is been worse as of yesterday, radiates to his right arm.  Associated with dyspnea on exertion and shortness of breath.  The chest pain comes and goes, it can happen at rest or with exertion.  Not on blood thinners, sees Dr. Jacinto Halim with Cardiology..  Review of Systems  Per HPI  Physical Exam  BP (!) 169/93 (BP Location: Right Arm)   Pulse 81   Temp 98.4 F (36.9 C) (Oral)   Resp 16   SpO2 94%  Gen:   Awake, no distress   Resp:  Normal effort  MSK:   Moves extremities without difficulty  Other:    Medical Decision Making  Medically screening exam initiated at 9:23 AM.  Appropriate orders placed.  Ronald [name] was informed that the remainder of the evaluation will be completed by another provider, this initial triage assessment does not replace that evaluation, and the importance of remaining in the ED until their evaluation is complete.  I have the patient in the triage room after the troponin resulted 232.  I do not see EKG obtained prior to now. Obtaining.  ASA ordered.   Theron Arista, [place] 11/19/21 [phone]

## 2021-11-19 NOTE — Interval H&P Note (Signed)
History and Physical Interval Note:  11/19/2021 2:32 PM  Ronald [name]  has presented today for surgery, with the diagnosis of nstemi.  The various methods of treatment have been discussed with the patient and family. After consideration of risks, benefits and other options for treatment, the patient has consented to  Procedure(s): LEFT HEART CATH AND CORONARY ANGIOGRAPHY (N/A) as a surgical intervention.  The patient's history has been reviewed, patient examined, no change in status, stable for surgery.  I have reviewed the patient's chart and labs.  Questions were answered to the patient's satisfaction.    2016 Appropriate Use Criteria for Coronary Revascularization in Patients With Acute Coronary Syndrome NSTEMI/Unstable angina, stabilized patient at high risk Indication:  Revascularization by PCI or CABG of 1 or more arteries in a patient with NSTEMI or unstable angina with Stabilization after presentation High risk for clinical events A (7) Indication: 16; Score 7   Gabriella Woodhead J Eduardo Wurth

## 2021-11-19 NOTE — H&P (Signed)
HISTORY AND PHYSICAL  Patient ID: Ronald [name] MRN: [id number] DOB/AGE: 76-Dec-1947 76 y.o.  Admit date: 11/19/2021 Attending physician: Tessa Lerner, DO Primary Physician:  Greta Doom, MD  Chief complaint: chest pain  HPI:  Ronald [name] is a 76 y.o. male who presents with a chief complaint of "chest pain." His past medical history and cardiovascular risk factors include: NSTEMI, CAD, carotid artery stenosis, HTN, HLD, smoking cigars, aortic atherosclerosis.  Patient is accompanied by his wife at bedside.  He started experiencing substernal discomfort Sunday night and he continues to have it intermittently with effort related activities. Symptoms are located substernally, intensity 8 out of 10, worse with effort related activities, resolves with resting.  Symptoms usually last for 30 minutes. Patient is modifying his physical activity to minimize symptoms over the last 2 to 3 days.  He has tried 1 sublingual nitroglycerin tablet yesterday which helped reduce the discomfort.  Associated symptoms include dyspnea on exertion. He denies diaphoresis, nausea, near-syncope or syncopal events.  At home his systolic blood pressures are around 130-160 mmHg on current medical therapy.  He drinks 2-4 bottles of beer every other day and smokes cigars often.  Last alcoholic beverage 4 days ago.  Of note he had a NSTEMI back in September 2015 and angiography revealed disease in the RCA/PDA and D1 distribution.  ALLERGIES: Allergies  Allergen Reactions   Angiotensin Receptor Blockers Diarrhea   Beta Adrenergic Blockers Other (See Comments)    Fatigue   Lisinopril Rash    PAST MEDICAL HISTORY: Past Medical History:  Diagnosis Date   Bronchitis    Hypertension    Myocardial infarction [facility])     PAST SURGICAL HISTORY: Past Surgical History:  Procedure Laterality Date   LEFT HEART CATHETERIZATION WITH CORONARY ANGIOGRAM N/A 01/15/2014   Procedure: LEFT HEART CATHETERIZATION  WITH CORONARY ANGIOGRAM;  Surgeon: Micheline Chapman, MD;  Location: [facility] CATH LAB;  Service: Cardiovascular;  Laterality: N/A;    FAMILY HISTORY: No family history of premature CAD or sudden cardiac death.   SOCIAL HISTORY:  The patient  reports that he has been smoking cigars. He has never used smokeless tobacco. He reports current alcohol use. He reports that he does not use drugs.  MEDICATIONS: Current Outpatient Medications  Medication Instructions   aspirin EC 81 mg, Oral, Daily   cloNIDine (CATAPRES) 0.2 mg, Oral, 2 times daily   ezetimibe (ZETIA) 10 mg, Oral, Daily   nitroGLYCERIN (NITROSTAT) 0.4 mg, Sublingual, Every 5 min x3 PRN   omeprazole (PRILOSEC OTC) 20 mg, Oral, Daily   simvastatin (ZOCOR) 40 mg, Oral, Daily   spironolactone (ALDACTONE) 25 mg, Oral, Daily   tamsulosin (FLOMAX) 0.4 mg, Oral, Daily     heparin 950 Units/hr (11/19/21 1309)   nitroGLYCERIN 5 mcg/min (11/19/21 1305)    REVIEW OF SYSTEMS: Review of Systems  Constitutional: Negative for diaphoresis.  Cardiovascular:  Positive for chest pain and dyspnea on exertion. Negative for claudication, irregular heartbeat, leg swelling, near-syncope, orthopnea, palpitations, paroxysmal nocturnal dyspnea and syncope.  Respiratory:  Negative for shortness of breath.   Hematologic/Lymphatic: Negative for bleeding problem.  Musculoskeletal:  Negative for muscle cramps and myalgias.  Neurological:  Negative for dizziness and light-headedness.  All other systems reviewed and are negative.   PHYSICAL EXAM:    11/19/2021    1:45 PM 11/19/2021    1:30 PM 11/19/2021    1:15 PM  Vitals with BMI  Systolic 152 158 962  Diastolic 78 77 81  Pulse 62  69 68    No intake or output data in the 24 hours ending 11/19/21 1356  Net IO Since Admission: No IO data has been entered for this period [11/19/21 1356] CONSTITUTIONAL: Well-developed and well-nourished. No acute distress.  SKIN: Skin is warm and dry. No rash noted. No  cyanosis. No pallor. No jaundice HEAD: Normocephalic and atraumatic.  EYES: No scleral icterus MOUTH/THROAT: Moist oral membranes.  Poor oral dentition. NECK: No JVD present. No thyromegaly noted.  Right carotid bruit. CHEST Normal respiratory effort. No intercostal retractions  LUNGS: Clear to auscultation bilaterally.  No stridor. No wheezes. No rales.  CARDIOVASCULAR: Regular, positive S1-S2, no murmurs rubs or gallops appreciated. ABDOMINAL: Soft, nontender, nondistended, positive bowel sounds in all 4 quadrants, no apparent ascites.  EXTREMITIES: No peripheral edema, warm to touch, 2+ DP and PT pulses. HEMATOLOGIC: No significant bruising NEUROLOGIC: Oriented to person, place, and time. Nonfocal. Normal muscle tone.  PSYCHIATRIC: Normal mood and affect. Normal behavior. Cooperative  RADIOLOGY: DG Chest 2 View  Result Date: 11/19/2021 CLINICAL DATA:  Chest pain and shortness of breath over the last 4 days EXAM: CHEST - 2 VIEW COMPARISON:  01/14/2014 FINDINGS: Atherosclerotic calcification of the aortic arch. Heart size within normal limits. Minimal scarring peripherally along the minor fissure. No blunting of the costophrenic angles. Mild spondylosis of the thoracolumbar junction. Stable symmetric thickening of the pleura in the mid to lower lung zones peripherally, probably from pleural adipose tissue. IMPRESSION: 1.  No active cardiopulmonary disease is radiographically apparent. 2.  Aortic Atherosclerosis (ICD10-I70.0). Electronically Signed   By: Van Clines M.D.   On: 11/19/2021 08:50    LABORATORY DATA: Lab Results  Component Value Date   WBC 6.7 11/19/2021   HGB 13.4 11/19/2021   HCT 38.0 (L) 11/19/2021   MCV 93.6 11/19/2021   PLT 208 11/19/2021    Recent Labs  Lab 11/19/21 0815  NA 134*  K 4.1  CL 102  CO2 22  BUN 13  CREATININE 1.10  CALCIUM 9.4  GLUCOSE 136*    Lipid Panel  Lab Results  Component Value Date   CHOL 116 07/25/2021   HDL 61 07/25/2021    LDLCALC 42 07/25/2021   TRIG 59 07/25/2021   CHOLHDL 2.4 02/10/2019    BNP (last 3 results) No results for input(s): "BNP" in the last 8760 hours.  HEMOGLOBIN A1C Lab Results  Component Value Date   HGBA1C 5.6 01/14/2014   MPG 114 01/14/2014    Cardiac Panel (last 3 results) No results for input(s): "CKTOTAL", "CKMB", "RELINDX" in the last 8760 hours.  Invalid input(s): "TROPONINHS"  No results found for: "CKTOTAL", "CKMB", "CKMBINDEX"   TSH No results for input(s): "TSH" in the last 8760 hours.    CARDIAC DATABASE: EKG: 11/19/2021: Normal sinus rhythm, 74 bpm, T wave inversion in the high lateral lateral leads suggestive of anterolateral ischemia, without underlying injury pattern.  T WI are new compared to prior ECG.  Echocardiogram: 04/13/2018: 1. Left ventricle cavity is normal in size. Mild concentric hypertrophy of the left ventricle. Normal global wall motion. Visual EF is 60-65%. Doppler evidence of grade I (impaired) diastolic dysfunction, elevated LAP. 2. Trileaflet aortic valve. Mild calcification of the aortic valve annulus. Mild aortic valve leaflet calcification. 3. Mild (Grade I) mitral regurgitation. Mild calcification of the mitral valve annulus. 4. Mild tricuspid regurgitation. No evidence of pulmonary hypertension.  Stress Testing:  Lexiscan myoview stress test 03/30/2018: 1. Lexiscan stress test was performed. Exercise capacity was not assessed. Stress  symptoms included dyspnea. Peak effect blood pressure was 174/64 mmHg. Stress EKG is non diagnostic for ischemia as it is a pharmacologic stress. In addition, stress electrocardiogram showed sinus tachycardia, incomplete RBBB, no stress arrhythmias and normal stress repolarization. 2. The overall quality of the study is fair. Left ventricular cavity is noted to be normal on the rest and stress studies. Gated SPECT images reveal normal myocardial thickening and wall motion. The left ventricular ejection  fraction was calculated 41%, although visually appears normal. Small sized, mild intensity, reversible perfusion defect in inferior apical myocardium likely represents ischemia. In addition, there is basal inferior myocardium defect seen worse on rest images, which likely represents tissue attenuation artifact. Recommend clinical correlation of ejection fraction with echocardiogram. 3. Intermediate risk study.  Heart Catheterization: Coronary angiogram 01/15/2014: Subtotally occluded PDA, mild diffuse disease in the right coronary artery. Moderate disease in the proximal LAD - 40% and first diagonal branch large with 80% to 90% stenosis in mid segment. LVEF 45-50% with inferior severe hypokinesis. No significant mitral regurgitation. Diffuse coronary calcification.  Abdominal aortic duplex 04/06/2017: Normal abdominal aorta. No evidence of aneurysm.  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 Duplex suggests stenosis in the left internal carotid artery (16-49%). Right vertebral artery flow is not visualized. Antegrade left vertebral artery flow. Follow up in six months is appropriate if clinically indicated.  IMPRESSION & RECOMMENDATIONS: Ronald [name] is a 76 y.o. Caucasian male whose past medical history and cardiovascular risk factors include: NSTEMI, CAD, carotid artery stenosis, HTN, HLD, smoking cigars, aortic atherosclerosis.  Impression: NSTEMI Elevated high sensitive troponins secondary to NSTEMI Established CAD with angina pectoris Carotid artery stenosis (right greater than left). Hyperlipidemia. Aortic atherosclerosis.  Plan: NSTEMI/elevated troponins The patient presents with CP for 3-4 days duration. EKG shows new TWI, and cardiac enzymes are elevated. Overall, this is consistent with ACS.  Echocardiogram will be ordered to evaluate for structural heart disease and left ventricular systolic function..   Please obtain lipid profile, A1c, and TSH.  Continue to trend cardiac enzymes.  Give 325 mg ASA (if not already given) and continue 81 mg daily. Will change Zocor to 80 mg atorvastatin and continue daily. Hold off beta blocker for now - he has complained of extreme fatigue in the past.   IV Heparin gtt per ACS IV nitro gtt for BP and Chest Pain  If he has recurrent chest pain give sublingual nitroglycerin and obtain EKG.  Remain on telemetry and obtain a stat EKG should chest pain return or change in character/quality.  Keep NPO Should symptoms of chest pain continue despite 3 sublingual nitroglycerin tablets, the patient becomes hemodynamically unstable (hypotension, congestive heart failure), or develop electrical instability (VT/VF), please notify cardiology on call for urgent evaluation.   The procedure of left heart catheterization with possible intervention was explained to the patient and wife in detail.  The indication, alternatives, risks and benefits were reviewed.  Complications include but not limited to bleeding, infection, vascular injury, stroke, myocardial infarction, arrhythmia (requiring medical or cardiopulmonary resuscitation), kidney injury (requiring short-term or long-term hemodialysis), radiation-related injury in the case of prolonged fluoroscopy use, emergent cardiac surgery, temporary or permanent pacemaker, and death. The patient and wife understands the risks of serious complication is 1-2 in 1000 with diagnostic cardiac cath and 1-2% or less with angioplasty/stenting. The patient and wife voices understanding and provides verbal feedback their questions and concerns are addressed to their satisfaction and patient wishes to  proceed with coronary angiography with possible PCI.  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 Intolerant to ACE inhibitor/ARB. Currently on clonidine 0.2 mg p.o. twice daily, spironolactone, Zocor, Zetia, aspirin Educated on importance of blood pressure management. Reemphasized importance of complete cessation of cigars and alcohol consumption.  Hyperlipidemia: We will transition Zocor to Lipitor Fasting lipid profile, direct LDL, LP(a).  Carotid disease bilateral Asymptomatic. Continue aspirin and statin therapy along with Zetia. Outpatient follow-up.  Smoking cigar: Educated on importance of complete cessation of nicotine products/cigars  Alcohol use: Drinks 2-4 bottles of beer every other day. Last alcoholic beverage approximately 4 days ago Reemphasized importance of reducing/complete cessation if possible.  Consultants: None    Code Status: Full Code    Family Communication:  Wife at bedside    Disposition Plan: Home w/ family.    CRITICAL CARE Performed by: Rex Kras   Total critical care time: 42 minutes   Critical care time was exclusive of separately billable procedures and treating other patients.   Critical care was necessary to treat or prevent imminent or life-threatening deterioration as he presents with symptoms concerning for acute coronary syndrome.   Critical care was time spent personally by me on the following activities: development of treatment plan with patient and/or surrogate as well as nursing, discussions with interventional cardiology/ED provider, evaluation of patient's response to treatment, examination of patient, obtaining history from patient or surrogate, ordering and performing treatments and interventions, ordering and review of laboratory studies, ordering and review of radiographic studies, pulse oximetry and re-evaluation of patient's condition.  Mechele Claude [facility]  Pager: [phone] Office: [phone] 11/19/2021, 1:56 PM

## 2021-11-19 NOTE — Progress Notes (Signed)
ANTICOAGULATION CONSULT NOTE - Initial Consult  Pharmacy Consult for heparin Indication: chest pain/ACS  Allergies  Allergen Reactions   Angiotensin Receptor Blockers Diarrhea   Beta Adrenergic Blockers Other (See Comments)    Fatigue   Lisinopril Rash    Patient Measurements: Height: 5\' 8"  (172.7 cm) Weight: 77.6 kg (171 lb) IBW/kg (Calculated) : 68.4 Heparin Dosing Weight: 77.6kg  Vital Signs: Temp: 98.4 F (36.9 C) (07/19 1159) Temp Source: Oral (07/19 0813) BP: 171/84 (07/19 1156) Pulse Rate: 59 (07/19 1159)  Labs: Recent Labs    11/19/21 0815  HGB 13.4  HCT 38.0*  PLT 208  CREATININE 1.10  TROPONINIHS 232*    Estimated Creatinine Clearance: 56.1 mL/min (by C-G formula based on SCr of 1.1 mg/dL).   Medical History: Past Medical History:  Diagnosis Date   Bronchitis    Hypertension    Myocardial infarction (HCC)     Medications:  Infusions:   heparin     nitroGLYCERIN      Assessment: 75 yom presented to the ED with CP. Troponin elevated and now starting IV heparin. Baseline CBC is WNL. He is not on anticoagulation PTA.   Goal of Therapy:  Heparin level 0.3-0.7 units/ml Monitor platelets by anticoagulation protocol: Yes   Plan:  Heparin bolus 4000 units IV x 1 Heparin gtt 950 units/hr Check an 8 hr heparin level Daily heparin level and CBC  Ronald [name], 11/21/21 11/19/2021,12:08 PM

## 2021-11-19 NOTE — ED Notes (Signed)
Patient transported to Cath lab by Evangeline Gula at this time. All belongings sent with patient.

## 2021-11-19 NOTE — ED Provider Notes (Signed)
[facility] EMERGENCY DEPARTMENT Provider Note  CSN: [id number] Arrival date & time: 11/19/21 8022  Chief Complaint(s) Chest Pain  HPI Ronald [name] is a 76 y.o. male with PMH CAD status post MI with known moderate diffuse RCA disease and high-grade D1 disease from last catheterization in 2015 but no DES placement who presents emergency department for evaluation of chest pain.  Patient states that over the last 4 days he has been having exertional chest pain that he first noticed when mowing his lawn.  He states that the pain is associated with shortness of breath but denies associated nausea, vomiting, diaphoresis.  He states the pain subsides when he is no longer exerting himself.  Patient took his home nitroglycerin last night and states that it did help a little.  He is currently asymptomatic here while at rest.   Past Medical History Past Medical History:  Diagnosis Date   Bronchitis    Hypertension    Myocardial infarction [facility])    Patient Active Problem List   Diagnosis Date Noted   Coronary artery disease involving native coronary artery of native heart without angina pectoris 04/18/2021   Asymptomatic bilateral carotid artery stenosis 04/18/2021   Mixed hyperlipidemia 04/18/2021   Tobacco abuse 01/16/2014   NSTEMI (non-ST elevated myocardial infarction) (HCC) 01/14/2014   Essential hypertension 01/14/2014   Home Medication(s) Prior to Admission medications   Medication Sig Start Date End Date Taking? Authorizing Provider  aspirin 81 MG EC tablet Take 1 tablet (81 mg total) by mouth daily. 09/25/20   [name], MD  cloNIDine (CATAPRES) 0.2 MG tablet Take 1 tablet (0.2 mg total) by mouth 2 (two) times daily. 10/14/21   [name], MD  cloNIDine (CATAPRES) 0.3 MG tablet Take 1 tablet (0.3 mg total) by mouth 2 (two) times daily. 07/18/21   [name], MD  ezetimibe (ZETIA) 10 MG tablet Take 1 tablet (10 mg total) by mouth daily. 07/18/21 08/22/22  [name],  MD  nitroGLYCERIN (NITROSTAT) 0.4 MG SL tablet Place 1 tablet (0.4 mg total) under the tongue every 5 (five) minutes x 3 doses as needed for chest pain. 09/25/20   [name], MD  omeprazole (PRILOSEC OTC) 20 MG tablet Take 1 tablet (20 mg total) by mouth daily. 09/25/20   [name], MD  simvastatin (ZOCOR) 40 MG tablet Take 1 tablet (40 mg total) by mouth daily. 03/31/21   [name], MD  spironolactone (ALDACTONE) 25 MG tablet Take 1 tablet (25 mg total) by mouth daily. 04/18/21 05/23/22  [name], MD                                                                                                                                    Past Surgical History Past Surgical History:  Procedure Laterality Date   LEFT HEART CATHETERIZATION WITH CORONARY ANGIOGRAM N/A 01/15/2014   Procedure: LEFT HEART CATHETERIZATION WITH CORONARY ANGIOGRAM;  Surgeon:  Micheline Chapman, MD;  Location: [facility] CATH LAB;  Service: Cardiovascular;  Laterality: N/A;   Family History No family history on file.  Social History Social History   Tobacco Use   Smoking status: Every Day    Types: Cigars   Smokeless tobacco: Never  Vaping Use   Vaping Use: Never used  Substance Use Topics   Alcohol use: Yes    Comment: beer occ   Drug use: No   Allergies Angiotensin receptor blockers, Beta adrenergic blockers, and Lisinopril  Review of Systems Review of Systems  Respiratory:  Positive for shortness of breath.   Cardiovascular:  Positive for chest pain.    Physical Exam Vital Signs  I have reviewed the triage vital signs BP (!) 169/93 (BP Location: Right Arm)   Pulse 81   Temp 98.4 F (36.9 C) (Oral)   Resp 16   SpO2 94%   Physical Exam Constitutional:      General: He is not in acute distress.    Appearance: Normal appearance.  HENT:     Head: Normocephalic and atraumatic.     Nose: No congestion or rhinorrhea.  Eyes:     General:        Right eye: No discharge.        Left eye: No discharge.      Extraocular Movements: Extraocular movements intact.     Pupils: Pupils are equal, round, and reactive to light.  Cardiovascular:     Rate and Rhythm: Normal rate and regular rhythm.     Heart sounds: No murmur heard. Pulmonary:     Effort: No respiratory distress.     Breath sounds: No wheezing or rales.  Abdominal:     General: There is no distension.     Tenderness: There is no abdominal tenderness.  Musculoskeletal:        General: Normal range of motion.     Cervical back: Normal range of motion.  Skin:    General: Skin is warm and dry.  Neurological:     General: No focal deficit present.     Mental Status: He is alert.     ED Results and Treatments Labs (all labs ordered are listed, but only abnormal results are displayed) Labs Reviewed  BASIC METABOLIC PANEL - Abnormal; Notable for the following components:      Result Value   Sodium 134 (*)    Glucose, Bld 136 (*)    All other components within normal limits  CBC - Abnormal; Notable for the following components:   RBC 4.06 (*)    HCT 38.0 (*)    All other components within normal limits  TROPONIN I (HIGH SENSITIVITY) - Abnormal; Notable for the following components:   Troponin I (High Sensitivity) 232 (*)    All other components within normal limits  TROPONIN I (HIGH SENSITIVITY)  Radiology DG Chest 2 View  Result Date: 11/19/2021 CLINICAL DATA:  Chest pain and shortness of breath over the last 4 days EXAM: CHEST - 2 VIEW COMPARISON:  01/14/2014 FINDINGS: Atherosclerotic calcification of the aortic arch. Heart size within normal limits. Minimal scarring peripherally along the minor fissure. No blunting of the costophrenic angles. Mild spondylosis of the thoracolumbar junction. Stable symmetric thickening of the pleura in the mid to lower lung zones peripherally, probably from pleural adipose  tissue. IMPRESSION: 1.  No active cardiopulmonary disease is radiographically apparent. 2.  Aortic Atherosclerosis (ICD10-I70.0). Electronically Signed   By: Gaylyn Rong M.D.   On: 11/19/2021 08:50    Pertinent labs & imaging results that were available during my care of the patient were reviewed by me and considered in my medical decision making (see MDM for details).  Medications Ordered in ED Medications  aspirin tablet 325 mg (325 mg Oral Given 11/19/21 0925)  nitroGLYCERIN (NITROSTAT) SL tablet 0.4 mg (has no administration in time range)                                                                                                                                     Procedures .Critical Care  Performed by: Glendora Score, MD Authorized by: Glendora Score, MD   Critical care provider statement:    Critical care time (minutes):  30   Critical care was necessary to treat or prevent imminent or life-threatening deterioration of the following conditions:  Cardiac failure   Critical care was time spent personally by me on the following activities:  Development of treatment plan with patient or surrogate, discussions with consultants, evaluation of patient's response to treatment, examination of patient, ordering and review of laboratory studies, ordering and review of radiographic studies, ordering and performing treatments and interventions, pulse oximetry, re-evaluation of patient's condition and review of old charts   (including critical care time)  Medical Decision Making / ED Course   This patient presents to the ED for concern of chest pain, shortness of breath, this involves an extensive number of treatment options, and is a complaint that carries with it a high risk of complications and morbidity.  The differential diagnosis includes ACS, NSTEMI, unstable angina, PE, pneumonia  MDM: Patient seen emergency room for evaluation of chest pain shortness of breath.  Physical  exam unremarkable.  Initial ECG with new T wave inversions.  Laboratory evaluation concerning with an initial troponin of 232.  Chest x-ray unremarkable.  Aspirin given and cardiology consulted who recommends heparin initiation and a nitro drip as needed.  Cardiology will come see the patient as he will likely need a catheterization today.  Patient recommending primary cardiology admission.  Patient then admitted.   Additional history obtained: -Additional history obtained from wife -External records from outside source obtained and reviewed including: Chart review including previous notes, labs, imaging, consultation notes   Lab Tests: -I ordered, reviewed, and  interpreted labs.   The pertinent results include:   Labs Reviewed  BASIC METABOLIC PANEL - Abnormal; Notable for the following components:      Result Value   Sodium 134 (*)    Glucose, Bld 136 (*)    All other components within normal limits  CBC - Abnormal; Notable for the following components:   RBC 4.06 (*)    HCT 38.0 (*)    All other components within normal limits  TROPONIN I (HIGH SENSITIVITY) - Abnormal; Notable for the following components:   Troponin I (High Sensitivity) 232 (*)    All other components within normal limits  TROPONIN I (HIGH SENSITIVITY)      EKG   EKG Interpretation  Date/Time:  Wednesday November 19 2021 09:27:38 EDT Ventricular Rate:  74 PR Interval:  138 QRS Duration: 98 QT Interval:  414 QTC Calculation: 459 R Axis:   -31 Text Interpretation: Normal sinus rhythm Left axis deviation new lateral T wave inversions, consider ischemia Abnormal ECG When compared with ECG of 16-Jan-2014 07:12, PREVIOUS ECG IS PRESENT Confirmed by Harlow Basley ([id number]) on 11/19/2021 9:31:58 AM         Imaging Studies ordered: I ordered imaging studies including chest x-ray I independently visualized and interpreted imaging. I agree with the radiologist interpretation   Medicines ordered and prescription  drug management: Meds ordered this encounter  Medications   aspirin tablet 325 mg   nitroGLYCERIN (NITROSTAT) SL tablet 0.4 mg    -I have reviewed the patients home medicines and have made adjustments as needed  Critical interventions Heparin, nitro drip, aspirin, cardiology consultation  Consultations Obtained: I requested consultation with the cardiologist Dr. Odis Hollingshead,  and discussed lab and imaging findings as well as pertinent plan - they recommend: Cardiology admission for catheterization, heparin, nitro   Cardiac Monitoring: The patient was maintained on a cardiac monitor.  I personally viewed and interpreted the cardiac monitored which showed an underlying rhythm of: NSR  Social Determinants of Health:  Factors impacting patients care include: none   Reevaluation: After the interventions noted above, I reevaluated the patient and found that they have :stayed the same  Co morbidities that complicate the patient evaluation  Past Medical History:  Diagnosis Date   Bronchitis    Hypertension    Myocardial infarction [facility])       Dispostion: I considered admission for this patient, and given NSTEMI, patient will require admission     Final Clinical Impression(s) / ED Diagnoses Final diagnoses:  None     @PCDICTATION @    , MD 11/19/21 1314

## 2021-11-19 NOTE — Plan of Care (Signed)
Problem: Education: Goal: Understanding of cardiac disease, CV risk reduction, and recovery process will improve 11/19/2021 2041 by Royetta Crochet, RN Outcome: Progressing 11/19/2021 2041 by Royetta Crochet, RN Outcome: Not Progressing Goal: Individualized Educational Video(s) 11/19/2021 2041 by Royetta Crochet, RN Outcome: Progressing 11/19/2021 2041 by Royetta Crochet, RN Outcome: Not Progressing   Problem: Activity: Goal: Ability to tolerate increased activity will improve 11/19/2021 2041 by Royetta Crochet, RN Outcome: Progressing 11/19/2021 2041 by Royetta Crochet, RN Outcome: Not Progressing   Problem: Cardiac: Goal: Ability to achieve and maintain adequate cardiovascular perfusion will improve 11/19/2021 2041 by Royetta Crochet, RN Outcome: Progressing 11/19/2021 2041 by Royetta Crochet, RN Outcome: Not Progressing   Problem: Health Behavior/Discharge Planning: Goal: Ability to safely manage health-related needs after discharge will improve 11/19/2021 2041 by Royetta Crochet, RN Outcome: Progressing 11/19/2021 2041 by Royetta Crochet, RN Outcome: Not Progressing   Problem: Education: Goal: Understanding of CV disease, CV risk reduction, and recovery process will improve 11/19/2021 2041 by Royetta Crochet, RN Outcome: Progressing 11/19/2021 2041 by Royetta Crochet, RN Outcome: Not Progressing Goal: Individualized Educational Video(s) 11/19/2021 2041 by Royetta Crochet, RN Outcome: Progressing 11/19/2021 2041 by Royetta Crochet, RN Outcome: Not Progressing   Problem: Activity: Goal: Ability to return to baseline activity level will improve 11/19/2021 2041 by Royetta Crochet, RN Outcome: Progressing 11/19/2021 2041 by Royetta Crochet, RN Outcome: Not Progressing   Problem: Cardiovascular: Goal: Ability to achieve and maintain adequate cardiovascular perfusion will improve 11/19/2021 2041 by Royetta Crochet, RN Outcome: Progressing 11/19/2021 2041 by  Royetta Crochet, RN Outcome: Not Progressing Goal: Vascular access site(s) Level 0-1 will be maintained 11/19/2021 2041 by Royetta Crochet, RN Outcome: Progressing 11/19/2021 2041 by Royetta Crochet, RN Outcome: Not Progressing   Problem: Health Behavior/Discharge Planning: Goal: Ability to safely manage health-related needs after discharge will improve 11/19/2021 2041 by Royetta Crochet, RN Outcome: Progressing 11/19/2021 2041 by Royetta Crochet, RN Outcome: Not Progressing   Problem: Education: Goal: Knowledge of General Education information will improve Description: Including pain rating scale, medication(s)/side effects and non-pharmacologic comfort measures 11/19/2021 2041 by Royetta Crochet, RN Outcome: Progressing 11/19/2021 2041 by Royetta Crochet, RN Outcome: Not Progressing   Problem: Health Behavior/Discharge Planning: Goal: Ability to manage health-related needs will improve 11/19/2021 2041 by Royetta Crochet, RN Outcome: Progressing 11/19/2021 2041 by Royetta Crochet, RN Outcome: Not Progressing   Problem: Clinical Measurements: Goal: Ability to maintain clinical measurements within normal limits will improve 11/19/2021 2041 by Royetta Crochet, RN Outcome: Progressing 11/19/2021 2041 by Royetta Crochet, RN Outcome: Not Progressing Goal: Will remain free from infection 11/19/2021 2041 by Royetta Crochet, RN Outcome: Progressing 11/19/2021 2041 by Royetta Crochet, RN Outcome: Not Progressing Goal: Diagnostic test results will improve 11/19/2021 2041 by Royetta Crochet, RN Outcome: Progressing 11/19/2021 2041 by Royetta Crochet, RN Outcome: Not Progressing Goal: Respiratory complications will improve 11/19/2021 2041 by Royetta Crochet, RN Outcome: Progressing 11/19/2021 2041 by Royetta Crochet, RN Outcome: Not Progressing Goal: Cardiovascular complication will be avoided 11/19/2021 2041 by Royetta Crochet, RN Outcome: Progressing 11/19/2021 2041 by  Royetta Crochet, RN Outcome: Not Progressing   Problem: Activity: Goal: Risk for activity intolerance will decrease 11/19/2021 2041 by Royetta Crochet, RN Outcome: Progressing 11/19/2021 2041 by Royetta Crochet, RN Outcome: Not Progressing   Problem: Nutrition: Goal: Adequate nutrition will be maintained 11/19/2021  2041 by Royetta Crochet, RN Outcome: Progressing 11/19/2021 2041 by Royetta Crochet, RN Outcome: Not Progressing   Problem: Coping: Goal: Level of anxiety will decrease 11/19/2021 2041 by Royetta Crochet, RN Outcome: Progressing 11/19/2021 2041 by Royetta Crochet, RN Outcome: Not Progressing   Problem: Elimination: Goal: Will not experience complications related to bowel motility 11/19/2021 2041 by Royetta Crochet, RN Outcome: Progressing 11/19/2021 2041 by Royetta Crochet, RN Outcome: Not Progressing Goal: Will not experience complications related to urinary retention 11/19/2021 2041 by Royetta Crochet, RN Outcome: Progressing 11/19/2021 2041 by Royetta Crochet, RN Outcome: Not Progressing   Problem: Pain Managment: Goal: General experience of comfort will improve 11/19/2021 2041 by Royetta Crochet, RN Outcome: Progressing 11/19/2021 2041 by Royetta Crochet, RN Outcome: Not Progressing   Problem: Safety: Goal: Ability to remain free from injury will improve 11/19/2021 2041 by Royetta Crochet, RN Outcome: Progressing 11/19/2021 2041 by Royetta Crochet, RN Outcome: Not Progressing   Problem: Skin Integrity: Goal: Risk for impaired skin integrity will decrease 11/19/2021 2041 by Royetta Crochet, RN Outcome: Progressing 11/19/2021 2041 by Royetta Crochet, RN Outcome: Not Progressing

## 2021-11-19 NOTE — ED Triage Notes (Signed)
Patient here with complaint of intermittent painful chest tightness and shortness of breath that started Sunday afternoon. Patient denies nausea and vomiting. Patient is alert, oriented, ambulatory, and in no apparent distress at this time.

## 2021-11-20 ENCOUNTER — Encounter (HOSPITAL_COMMUNITY): Payer: Self-pay | Admitting: Cardiology

## 2021-11-20 ENCOUNTER — Telehealth: Payer: Self-pay

## 2021-11-20 ENCOUNTER — Other Ambulatory Visit (HOSPITAL_COMMUNITY): Payer: Self-pay

## 2021-11-20 ENCOUNTER — Inpatient Hospital Stay (HOSPITAL_COMMUNITY): Payer: No Typology Code available for payment source

## 2021-11-20 LAB — CBC
HCT: 36.4 % — ABNORMAL LOW (ref 39.0–52.0)
Hemoglobin: 12.5 g/dL — ABNORMAL LOW (ref 13.0–17.0)
MCH: 32.3 pg (ref 26.0–34.0)
MCHC: 34.3 g/dL (ref 30.0–36.0)
MCV: 94.1 fL (ref 80.0–100.0)
Platelets: 214 10*3/uL (ref 150–400)
RBC: 3.87 MIL/uL — ABNORMAL LOW (ref 4.22–5.81)
RDW: 12.2 % (ref 11.5–15.5)
WBC: 7.2 10*3/uL (ref 4.0–10.5)
nRBC: 0 % (ref 0.0–0.2)

## 2021-11-20 LAB — LIPID PANEL
Cholesterol: 122 mg/dL (ref 0–200)
HDL: 51 mg/dL (ref >40)
LDL Cholesterol: 45 mg/dL (ref 0–99)
Total CHOL/HDL Ratio: 2.4 RATIO
Triglycerides: 132 mg/dL (ref <150)
VLDL: 26 mg/dL (ref 0–40)

## 2021-11-20 LAB — ECHOCARDIOGRAM COMPLETE
AR max vel: 1.74 cm2
AV Area VTI: 1.75 cm2
AV Area mean vel: 1.74 cm2
AV Mean grad: 4 mmHg
AV Peak grad: 6.1 mmHg
Ao pk vel: 1.23 m/s
Area-P 1/2: 5.58 cm2
Calc EF: 52.2 %
Height: 68 in
S' Lateral: 3 cm
Single Plane A2C EF: 51.1 %
Single Plane A4C EF: 53.3 %
Weight: 2736 oz

## 2021-11-20 LAB — BASIC METABOLIC PANEL
Anion gap: 8 (ref 5–15)
BUN: 14 mg/dL (ref 8–23)
CO2: 23 mmol/L (ref 22–32)
Calcium: 8.8 mg/dL — ABNORMAL LOW (ref 8.9–10.3)
Chloride: 105 mmol/L (ref 98–111)
Creatinine, Ser: 1.05 mg/dL (ref 0.61–1.24)
GFR, Estimated: >60 mL/min (ref >60)
Glucose, Bld: 123 mg/dL — ABNORMAL HIGH (ref 70–99)
Potassium: 4.1 mmol/L (ref 3.5–5.1)
Sodium: 136 mmol/L (ref 135–145)

## 2021-11-20 MED ORDER — CLOPIDOGREL BISULFATE 75 MG PO TABS
75.0000 mg | ORAL_TABLET | Freq: Every day | ORAL | 1 refills | Status: DC
  Filled 2021-11-20: qty 30, 30d supply, fill #0

## 2021-11-20 MED ORDER — METOPROLOL SUCCINATE ER 25 MG PO TB24
25.0000 mg | ORAL_TABLET | Freq: Every day | ORAL | 0 refills | Status: DC
  Filled 2021-11-20: qty 30, 30d supply, fill #0

## 2021-11-20 MED ORDER — ATORVASTATIN CALCIUM 80 MG PO TABS
80.0000 mg | ORAL_TABLET | Freq: Every day | ORAL | 1 refills | Status: DC
  Filled 2021-11-20: qty 60, 60d supply, fill #0

## 2021-11-20 MED ORDER — AMLODIPINE BESYLATE 5 MG PO TABS
5.0000 mg | ORAL_TABLET | Freq: Every day | ORAL | 0 refills | Status: DC
  Filled 2021-11-20: qty 30, 30d supply, fill #0

## 2021-11-20 MED ORDER — ISOSORBIDE MONONITRATE ER 30 MG PO TB24
30.0000 mg | ORAL_TABLET | Freq: Every morning | ORAL | 1 refills | Status: DC
  Filled 2021-11-20: qty 30, 30d supply, fill #0

## 2021-11-20 NOTE — Discharge Summary (Signed)
Physician Discharge Summary  Patient ID: BANYAN GOODCHILD MRN: [id number] DOB/AGE: Dec 15, 1945 76 y.o.  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 Carotid stenosis, bilaterally. Cigar smoker Alcohol use Aortic atherosclerosis  Hospital Course:   76 y.o. Caucasian male  whose past medical history and cardiovascular risk factors include: NSTEMI, CAD, carotid artery stenosis, HTN, HLD, smoking cigars, aortic atherosclerosis.  Patient was in for non-STEMI and underwent invasive angiography which noted multivessel CAD but did not require coronary intervention.  Heart catheterization results noted below for reference.  Overnight patient remained stable from a cardiovascular standpoint.  He is no longer experiencing anginal discomfort.  Educated on importance of improving his modifiable cardiovascular risk factors such as reducing alcohol consumption to no more than 2 standard drinks per day and complete cessation of cigar smoking.  Given the progression of his CAD would recommend stricter cholesterol management as well.  Patient is not allergic to beta-blockers and in fact has done well on metoprolol in the past.  This was confirmed with his wife over the phone today willing to retrial Toprol-XL.   Discharge Exam: Today's Vitals   11/20/21 0000 11/20/21 0400 11/20/21 0603 11/20/21 0802  BP: 126/78  111/60 123/68  Pulse:   81 73  Resp: 16   18  Temp: 98.2 F (36.8 C)  98.1 F (36.7 C) 97.8 F (36.6 C)  TempSrc: Oral  Oral Oral  SpO2:   96% 96%  Weight:      Height:      PainSc: 0-No pain 0-No pain     Body mass index is 26 kg/m.  CONSTITUTIONAL: Well-developed and well-nourished. No acute distress.  SKIN: Skin is warm and dry. No rash noted. No cyanosis. No pallor. No jaundice HEAD: Normocephalic and atraumatic.  EYES: No scleral  icterus MOUTH/THROAT: Moist oral membranes.  Poor oral dentition. NECK: No JVD present. No thyromegaly noted.  Right carotid bruit. CHEST Normal respiratory effort. No intercostal retractions  LUNGS: Clear to auscultation bilaterally.  No stridor. No wheezes. No rales.  CARDIOVASCULAR: Regular, positive S1-S2, no murmurs rubs or gallops appreciated. ABDOMINAL: Soft, nontender, nondistended, positive bowel sounds in all 4 quadrants, no apparent ascites.  EXTREMITIES: No peripheral edema, warm to touch, 2+ DP and PT pulses. HEMATOLOGIC: No significant bruising NEUROLOGIC: Oriented to person, place, and time. Nonfocal. Normal muscle tone.  PSYCHIATRIC: Normal mood and affect. Normal behavior. Cooperative  Recommendations on discharge:   1.  Emphasized the importance of improving his modifiable cardiovascular risk factors which include but not limited to complete cessation of cigar smoking, reducing alcohol consumption to no more than 2 standard drinks per day, lipid and blood pressure management.   2.  Started on Imdur and amlodipine with regards to antianginal therapy.  Patient and wife are willing to retry Toprol-XL 25 mg p.o. every morning as well.  3.  Transitioned from Zocor to atorvastatin.  4.  Continue dual antiplatelet therapy for 1 year given the recent non-STEMI.  5.  Cardiac rehab  6.  Transition of care visit scheduled for 2 weeks with Dr. Jacinto Halim, primary cardiologist.   7.  Meds sent to [facility] pharmacy and plan of care discussed with his wife over the phone.  CARDIAC DATABASE: EKG: 11/19/2021: Normal sinus rhythm, 74 bpm, T wave inversion in the high lateral lateral leads suggestive of anterolateral ischemia, without underlying injury pattern.  T WI are new compared to prior ECG.  Echocardiogram: 11/20/2021  1. Left ventricular ejection fraction, by estimation, is 50 to 55%. The  left ventricle has low normal function. The left ventricle has no regional  wall motion  abnormalities. There is mild left ventricular hypertrophy.  Left ventricular diastolic  parameters were normal. The E/e' is 13.8.   2. Right ventricular systolic function is normal. The right ventricular  size is normal.   3. The mitral valve is grossly normal. No evidence of mitral valve  regurgitation. No evidence of mitral stenosis.   4. The aortic valve is tricuspid. Aortic valve regurgitation is not  visualized. Aortic valve sclerosis is present, with no evidence of aortic  valve stenosis. Prior study 04/13/2018: LVEF 60-65%, Grade I diastolic dysfunction, elevated LAP, mild MR / TR.   Stress Testing:  Lexiscan myoview stress test 03/30/2018: 1. Lexiscan stress test was performed. Exercise capacity was not assessed. Stress symptoms included dyspnea. Peak effect blood pressure was 174/64 mmHg. Stress EKG is non diagnostic for ischemia as it is a pharmacologic stress. In addition, stress electrocardiogram showed sinus tachycardia, incomplete RBBB, no stress arrhythmias and normal stress repolarization. 2. The overall quality of the study is fair. Left ventricular cavity is noted to be normal on the rest and stress studies. Gated SPECT images reveal normal myocardial thickening and wall motion. The left ventricular ejection fraction was calculated 41%, although visually appears normal. Small sized, mild intensity, reversible perfusion defect in inferior apical myocardium likely represents ischemia. In addition, there is basal inferior myocardium defect seen worse on rest images, which likely represents tissue attenuation artifact. Recommend clinical correlation of ejection fraction with echocardiogram. 3. Intermediate risk study.   Heart Catheterization: 11/19/2021:   Mid LM lesion is 20% stenosed.   Prox LAD lesion is 40% stenosed.   Lat 1st Diag lesion is 80% stenosed.   1st Diag lesion is 90% stenosed.   RPDA lesion is 80% stenosed.   LV end diastolic pressure is normal.   The left  ventricular ejection fraction is 50-55% by visual estimate.   LM: Short vessel with 20% disease LAD: Diffuse 40% disease in prox-mid LAD with moderate calcification         Diag 1 bifurcates into a superior and an inferior branch                     Superior branch is medium caliber with 80% stenosis at bifurcation into two further tertiary branches. (Although severe, this was also reported in 2015, therefore likely a chronic stable lesion)                     Inferior branch is small caliber with 90% stenosis before bifurcation into further tertiary branches (Not present in 2015, therefore possibly culprit for today's event) Lcx: Medium caliber vessel with no significant disease RCA: Dominant, tortuous vessel           Small to medium caliber PDA with ostial-prox 80% stenosis (Although severe, this was also reported in 2015, therefore likely a chronic stable lesion)   Normal LVEDP, LVEF   Moderate to severe multivessel disease, primarily in secondary and tertiary branches Unchanged severe lesions in diag and RPDA, likely stable since 2015 Possibly culprit small inferior branch stenosis in diag is too small to intervene Trop mildly elevated and relatively flat, with hypertension being a possible insult   Recommend medical management at this time with up titration of anti anginal therapy, smoking cessation, risk factor modification.   Abdominal aortic  duplex 04/06/2017: Normal abdominal aorta. No evidence of aneurysm.   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 Duplex suggests stenosis in the left internal carotid artery (16-49%). Right vertebral artery flow is not visualized. Antegrade left vertebral artery flow. Follow up in six months is appropriate if clinically indicated.  Labs:   Lab Results  Component Value Date   WBC 7.2 11/20/2021   HGB 12.5 (L) 11/20/2021   HCT 36.4 (L) 11/20/2021   MCV 94.1 11/20/2021    PLT 214 11/20/2021    Recent Labs  Lab 11/20/21 0213  NA 136  K 4.1  CL 105  CO2 23  BUN 14  CREATININE 1.05  CALCIUM 8.8*  GLUCOSE 123*    Lipid Panel  Lab Results  Component Value Date   CHOL 122 11/20/2021   HDL 51 11/20/2021   LDLCALC 45 11/20/2021   TRIG 132 11/20/2021   CHOLHDL 2.4 11/20/2021   BNP (last 3 results) Recent Labs    11/19/21 1419  BNP 59.8    HEMOGLOBIN A1C Lab Results  Component Value Date   HGBA1C 5.9 (H) 11/19/2021   MPG 122.63 11/19/2021    Cardiac Panel (last 3 results) No results for input(s): "CKTOTAL", "CKMB", "TROPONINI", "RELINDX" in the last 8760 hours.  Lab Results  Component Value Date   TROPONINI 7.94 (HH) 01/15/2014     TSH Recent Labs    11/19/21 1419  TSH 1.281    Radiology: ECHOCARDIOGRAM COMPLETE  Result Date: 11/20/2021    ECHOCARDIOGRAM REPORT   Patient Name:   DSHAWN MCNAY Date of Exam: 11/20/2021 Medical Rec #:  [id number]        Height:       68.0 in Accession #:    [id number]       Weight:       171.0 lb Date of Birth:  [birth year]        BSA:          1.912 m Patient Age:    75 years         BP:           158/75 mmHg Patient Gender: M                HR:           70 bpm. Exam Location:  Inpatient Procedure: 2D Echo, Cardiac Doppler and Color Doppler Indications:    NSTEMI  History:        Patient has no prior history of Echocardiogram examinations.                 CAD, Signs/Symptoms:Chest Pain; Risk Factors:Current Smoker and                 Hypertension.  Sonographer:    Rodrigo Ran RCS Referring Phys: [id number] Nieves Barberi IMPRESSIONS  1. Left ventricular ejection fraction, by estimation, is 50 to 55%. The left ventricle has low normal function. The left ventricle has no regional wall motion abnormalities. There is mild left ventricular hypertrophy. Left ventricular diastolic parameters were normal. The E/e' is 13.8.  2. Right ventricular systolic function is normal. The right ventricular size is normal.  3.  The mitral valve is grossly normal. No evidence of mitral valve regurgitation. No evidence of mitral stenosis.  4. The aortic valve is tricuspid. Aortic valve regurgitation is not visualized. Aortic valve sclerosis is present, with no evidence of aortic valve stenosis. Comparison(s): Prior study  04/13/2018: LVEF 60-65%, Grade I diastolic dysfunction, elevated LAP, mild MR / TR. FINDINGS  Left Ventricle: Left ventricular ejection fraction, by estimation, is 50 to 55%. The left ventricle has low normal function. The left ventricle has no regional wall motion abnormalities. The left ventricular internal cavity size was normal in size. There is mild left ventricular hypertrophy. Left ventricular diastolic parameters were normal. Normal left ventricular filling pressure. The E/e' is 13.8. Right Ventricle: The right ventricular size is normal. No increase in right ventricular wall thickness. Right ventricular systolic function is normal. Left Atrium: Left atrial size was normal in size. Right Atrium: Right atrial size was normal in size. Pericardium: There is no evidence of pericardial effusion. Mitral Valve: The mitral valve is grossly normal. There is mild thickening of the mitral valve leaflet(s). Normal mobility of the mitral valve leaflets. Mild mitral annular calcification. No evidence of mitral valve regurgitation. No evidence of mitral valve stenosis. Tricuspid Valve: The tricuspid valve is normal in structure. Tricuspid valve regurgitation is trivial. No evidence of tricuspid stenosis. Aortic Valve: The aortic valve is tricuspid. Aortic valve regurgitation is not visualized. Aortic valve sclerosis is present, with no evidence of aortic valve stenosis. Aortic valve mean gradient measures 4.0 mmHg. Aortic valve peak gradient measures 6.1  mmHg. Aortic valve area, by VTI measures 1.75 cm. Pulmonic Valve: The pulmonic valve was grossly normal. Pulmonic valve regurgitation is not visualized. No evidence of pulmonic  stenosis. Aorta: The aortic root and ascending aorta are structurally normal, with no evidence of dilitation. IAS/Shunts: The atrial septum is grossly normal.  LEFT VENTRICLE PLAX 2D LVIDd:         4.80 cm     Diastology LVIDs:         3.00 cm     LV e' medial:    2.83 cm/s LV PW:         1.10 cm     LV E/e' medial:  18.0 LV IVS:        1.20 cm     LV e' lateral:   5.33 cm/s LVOT diam:     1.80 cm     LV E/e' lateral: 9.6 LV SV:         42 LV SV Index:   22 LVOT Area:     2.54 cm  LV Volumes (MOD) LV vol d, MOD A2C: 31.3 ml LV vol d, MOD A4C: 20.5 ml LV vol s, MOD A2C: 15.3 ml LV vol s, MOD A4C: 9.6 ml LV SV MOD A2C:     16.0 ml LV SV MOD A4C:     20.5 ml LV SV MOD BP:      13.4 ml RIGHT VENTRICLE RV Basal diam:  3.00 cm RV Mid diam:    2.80 cm RV S prime:     13.80 cm/s TAPSE (M-mode): 1.2 cm LEFT ATRIUM           Index        RIGHT ATRIUM          Index LA diam:      2.60 cm 1.36 cm/m   RA Area:     9.98 cm LA Vol (A2C): 24.4 ml 12.76 ml/m  RA Volume:   17.30 ml 9.05 ml/m LA Vol (A4C): 14.4 ml 7.53 ml/m  AORTIC VALVE                    PULMONIC VALVE AV Area (Vmax):    1.74 cm  PV Vmax:       0.81 m/s AV Area (Vmean):   1.74 cm     PV Peak grad:  2.6 mmHg AV Area (VTI):     1.75 cm AV Vmax:           123.00 cm/s AV Vmean:          91.500 cm/s AV VTI:            0.241 m AV Peak Grad:      6.1 mmHg AV Mean Grad:      4.0 mmHg LVOT Vmax:         84.20 cm/s LVOT Vmean:        62.500 cm/s LVOT VTI:          0.166 m LVOT/AV VTI ratio: 0.69  AORTA Ao Root diam: 3.70 cm Ao Asc diam:  3.60 cm MITRAL VALVE               TRICUSPID VALVE MV Area (PHT): 5.58 cm    TR Peak grad:   15.1 mmHg MV Decel Time: 136 msec    TR Vmax:        194.00 cm/s MV E velocity: 51.00 cm/s MV A velocity: 82.30 cm/s  SHUNTS MV E/A ratio:  0.62        Systemic VTI:  0.17 m                            Systemic Diam: 1.80 cm Ria Redcay DO Electronically signed by Tessa Lerner DO Signature Date/Time: 11/20/2021/12:17:31 PM    Final     CARDIAC CATHETERIZATION  Result Date: 11/19/2021 Images from the original result were not included.   Mid LM lesion is 20% stenosed.   Prox LAD lesion is 40% stenosed.   Lat 1st Diag lesion is 80% stenosed.   1st Diag lesion is 90% stenosed.   RPDA lesion is 80% stenosed.   LV end diastolic pressure is normal.   The left ventricular ejection fraction is 50-55% by visual estimate. LM: Short vessel with 20% disease LAD: Diffuse 40% disease in prox-mid LAD with moderate calcification         Diag 1 bifurcates into a superior and an inferior branch                     Superior branch is medium caliber with 80% stenosis at bifurcation into two further tertiary branches. (Although severe, this was also reported in 2015, therefore likely a chronic stable lesion)                     Inferior branch is small caliber with 90% stenosis before bifurcation into further tertiary branches (Not present in 2015, therefore possibly culprit for today's event) Lcx: Medium caliber vessel with no significant disease RCA: Dominant, tortuous vessel          Small to medium caliber PDA with ostial-prox 80% stenosis (Although severe, this was also reported in 2015, therefore likely a chronic stable lesion) Normal LVEDP, LVEF Moderate to severe multivessel disease, primarily in secondary and tertiary branches Unchanged severe lesions in diag and RPDA, likely stable since 2015 Possibly culprit small inferior branch stenosis in diag is too small to intervene Trop mildly elevated and relatively flat, with hypertension being a possible insult Recommend medical management at this time with up titration of anti anginal therapy, smoking cessation, risk factor modification. Manish Emiliano Dyer,  MD Pager: [phone] Office: [phone]  DG Chest 2 View  Result Date: 11/19/2021 CLINICAL DATA:  Chest pain and shortness of breath over the last 4 days EXAM: CHEST - 2 VIEW COMPARISON:  01/14/2014 FINDINGS: Atherosclerotic calcification of the  aortic arch. Heart size within normal limits. Minimal scarring peripherally along the minor fissure. No blunting of the costophrenic angles. Mild spondylosis of the thoracolumbar junction. Stable symmetric thickening of the pleura in the mid to lower lung zones peripherally, probably from pleural adipose tissue. IMPRESSION: 1.  No active cardiopulmonary disease is radiographically apparent. 2.  Aortic Atherosclerosis (ICD10-I70.0). Electronically Signed   By: Gaylyn Rong M.D.   On: 11/19/2021 08:50      FOLLOW UP PLANS AND APPOINTMENTS Discharge Instructions     Amb Referral to Cardiac Rehabilitation   Complete by: As directed    Diagnosis: NSTEMI   After initial evaluation and assessments completed: Virtual Based Care may be provided alone or in conjunction with Phase 2 Cardiac Rehab based on patient barriers.: Yes      Allergies as of 11/20/2021       Reactions   Angiotensin Receptor Blockers Diarrhea   Beta Adrenergic Blockers Other (See Comments)   Fatigue   Zestril [lisinopril] Rash        Medication List     STOP taking these medications    simvastatin 40 MG tablet Commonly known as: ZOCOR       TAKE these medications    amLODipine 5 MG tablet Commonly known as: NORVASC Take 1 tablet (5 mg total) by mouth daily at 10 pm.   aspirin EC 81 MG tablet Take 1 tablet (81 mg total) by mouth daily.   atorvastatin 80 MG tablet Commonly known as: LIPITOR Take 1 tablet (80 mg total) by mouth at bedtime.   cloNIDine 0.2 MG tablet Commonly known as: CATAPRES Take 1 tablet (0.2 mg total) by mouth 2 (two) times daily.   clopidogrel 75 MG tablet Commonly known as: PLAVIX Take 1 tablet (75 mg total) by mouth daily with breakfast.   ezetimibe 10 MG tablet Commonly known as: ZETIA Take 1 tablet (10 mg total) by mouth daily.   FISH OIL PO Take 1 capsule by mouth daily.   isosorbide mononitrate 30 MG 24 hr tablet Commonly known as: IMDUR Take 1 tablet (30 mg  total) by mouth every morning.   MAGNESIUM PO Take 1 tablet by mouth at bedtime.   metoprolol succinate 25 MG 24 hr tablet Commonly known as: Toprol XL Take 1 tablet (25 mg total) by mouth daily.   nitroGLYCERIN 0.4 MG SL tablet Commonly known as: NITROSTAT Place 1 tablet (0.4 mg total) under the tongue every 5 (five) minutes x 3 doses as needed for chest pain.   omeprazole 20 MG tablet Commonly known as: PRILOSEC OTC Take 1 tablet (20 mg total) by mouth daily.   OVER THE COUNTER MEDICATION Take 4 tablets by mouth daily. Total Beets.   OVER THE COUNTER MEDICATION Take 3 capsules by mouth daily. Balance of Nature Veggies   OVER THE COUNTER MEDICATION Take 3 capsules by mouth daily. Balance of Nature Fruits   spironolactone 25 MG tablet Commonly known as: ALDACTONE Take 1 tablet (25 mg total) by mouth daily.   tamsulosin 0.4 MG Caps capsule Commonly known as: FLOMAX Take 0.4 mg by mouth daily.        Follow-up Information     [name], MD Follow up on 12/15/2021.   Specialty: Cardiology Why:  11:15am RE: NSTEMI Contact information: [address] [place] [address] [phone]                Total time spent: 37 minutes.  Tessa Lerner, [place], [facility]  Pager: [phone] Office: [phone]

## 2021-11-20 NOTE — Telephone Encounter (Signed)
Location of hospitalization: [place] Reason for hospitalization: SOB, CP Date of discharge: 11/20/2021 Date of first communication with patient: today Person contacting patient: Me Current symptoms: none Do you understand why you were in the Hospital: Yes Questions regarding discharge instructions: None Where were you discharged to: Home Medications reviewed: Yes Allergies reviewed: Yes Dietary changes reviewed: Yes. Discussed low fat and low salt diet.  Referals reviewed: NA Activities of Daily Living: Able to with mild limitations Any transportation issues/concerns: None Any patient concerns: None Confirmed importance & date/time of Follow up appt: Yes Confirmed with patient if condition begins to worsen call. Pt was given the office number and encouraged to call back with questions or concerns: Yes

## 2021-11-20 NOTE — Progress Notes (Signed)
CARDIAC REHAB PHASE I   PRE:  Rate/Rhythm: 80 NSR  BP:  Sitting: 134/92      SaO2: 93 RA  MODE:  Ambulation: 400 ft   POST:  Rate/Rhythm: 102 NSR  BP:  Sitting: 158/75      SaO2: 96 RA   Seen pt from 1006-1106 pt was received from bed with no assistance and ambulated through hallway with minimal contact guard assistance. During ambulation pt was asymptomatic and returned to bed w/o complaints.  Pt was given MI book, heart healthy and diabetic diet, ex guidelines, restrictions, NTG use,risk factors, smoking cessation, and CRPII education. Pt is being referred to CRP[facility] at [facility].   Ronald [name]  11:00 AM 11/20/2021

## 2021-11-20 NOTE — Care Management (Signed)
  Transition of Care (TOC) Screening Note   Patient Details  Name: Ronald [name] Date of Birth: 1945-05-28   Transition of Care [facility]) CM/SW Contact:    Gala Lewandowsky, RN Phone Number: 11/20/2021, 11:37 AM    Transition of Care Department [facility]) has reviewed the patient and no TOC needs have been identified at this time. Case Manager called the [facility] Fees Coordinator to make them aware that the patient is hospitalized. Case Manager will continue to follow for transition of care needs.

## 2021-11-21 LAB — LIPOPROTEIN A (LPA): Lipoprotein (a): 50.7 nmol/L — ABNORMAL HIGH (ref <75.0)

## 2021-12-15 ENCOUNTER — Encounter: Payer: Self-pay | Admitting: Cardiology

## 2021-12-15 ENCOUNTER — Ambulatory Visit: Payer: Medicare Other | Admitting: Cardiology

## 2021-12-15 VITALS — BP 102/56 | HR 64 | Resp 16 | Ht 68.0 in | Wt 176.0 lb

## 2021-12-15 DIAGNOSIS — I25118 Atherosclerotic heart disease of native coronary artery with other forms of angina pectoris: Secondary | ICD-10-CM

## 2021-12-15 DIAGNOSIS — I10 Essential (primary) hypertension: Secondary | ICD-10-CM

## 2021-12-15 DIAGNOSIS — E782 Mixed hyperlipidemia: Secondary | ICD-10-CM

## 2021-12-15 DIAGNOSIS — I6523 Occlusion and stenosis of bilateral carotid arteries: Secondary | ICD-10-CM

## 2021-12-15 MED ORDER — CLOPIDOGREL BISULFATE 75 MG PO TABS: 75.0000 mg | ORAL_TABLET | Freq: Every day | ORAL | 0 refills | Status: DC

## 2021-12-15 MED ORDER — ISOSORBIDE MONONITRATE ER 30 MG PO TB24: 30.0000 mg | ORAL_TABLET | Freq: Every morning | ORAL | 1 refills | Status: DC

## 2021-12-15 MED ORDER — METOPROLOL SUCCINATE ER 25 MG PO TB24: 25.0000 mg | ORAL_TABLET | Freq: Every day | ORAL | 0 refills | Status: DC

## 2021-12-15 MED ORDER — ISOSORBIDE MONONITRATE ER 30 MG PO TB24
30.0000 mg | ORAL_TABLET | Freq: Every morning | ORAL | 0 refills | Status: DC
Start: 2021-12-15 — End: 2021-12-15

## 2021-12-15 MED ORDER — CLOPIDOGREL BISULFATE 75 MG PO TABS: 75.0000 mg | ORAL_TABLET | Freq: Every day | ORAL | 3 refills | Status: DC

## 2021-12-15 MED ORDER — METOPROLOL SUCCINATE ER 25 MG PO TB24: 25.0000 mg | ORAL_TABLET | Freq: Every day | ORAL | 3 refills | Status: DC

## 2021-12-15 NOTE — H&P (View-Only) (Signed)
Primary Physician/Referring:  Greta Doom, MD  Patient ID: Ronald [name], male    DOB: [birth year], 76 y.o.   MRN: [id number]  Chief Complaint  Patient presents with   Hospitalization Follow-up   Chest Pain   HPI:    Ronald [name]  is a 76 y.o. male  with HTN, Hyperlipidemia, admitted on 01/14/2014 with non-ST elevation myocardial infarction with subtotally occluded but diffusely diseased PDA branch of the right coronary artery which was left alone, moderate diffuse RCA disease and high grade D1 disease.  Past medical history significant for hypercholesterolemia, hypertension and multiple medication allergies and intolerances.  He also smokes cigars.  Patient presented to the emergency room on 11/19/2021 and discharged the following day with positive cardiac markers suggestive of NSTEMI.  Cardiac catheterization revealed small vessel disease and hence recommended medical therapy.  He now presents for follow-up.  Patient continues to have exertional chest pain, even doing minimal activities around the house has to sit down frequently and associated with marked dyspnea as well.  No rest pain, no PND or orthopnea.  Unfortunately still smokes.  Past Medical History:  Diagnosis Date   Bronchitis    Hypertension    Myocardial infarction [facility])    Past Surgical History:  Procedure Laterality Date   LEFT HEART CATH AND CORONARY ANGIOGRAPHY N/A 11/19/2021   Procedure: LEFT HEART CATH AND CORONARY ANGIOGRAPHY;  Surgeon: Elder Negus, MD;  Location: [facility] INVASIVE CV LAB;  Service: Cardiovascular;  Laterality: N/A;   LEFT HEART CATHETERIZATION WITH CORONARY ANGIOGRAM N/A 01/15/2014   Procedure: LEFT HEART CATHETERIZATION WITH CORONARY ANGIOGRAM;  Surgeon: Micheline Chapman, MD;  Location: [facility] CATH LAB;  Service: Cardiovascular;  Laterality: N/A;   History reviewed. No pertinent family history.  Social History   Tobacco Use   Smoking status: Every Day    Types: Cigars   Smokeless  tobacco: Never  Substance Use Topics   Alcohol use: Yes    Comment: beer occ   Marital Status: Married  ROS  Review of Systems  Cardiovascular:  Positive for chest pain and dyspnea on exertion. Negative for leg swelling.   Objective  Blood pressure (!) 102/56, pulse 64, resp. rate 16, height 5\' 8"  (1.727 m), weight 176 lb (79.8 kg), SpO2 96 %.     12/15/2021   11:29 AM 11/20/2021    8:02 AM 11/20/2021    6:03 AM  Vitals with BMI  Height 5\' 8"     Weight 176 lbs    BMI 26.77    Systolic 102 123 11/22/2021  Diastolic 56 68 60  Pulse 64 73 81     Physical Exam Constitutional:      Appearance: He is well-developed.  Neck:     Vascular: Carotid bruit (right) present. No JVD.  Cardiovascular:     Rate and Rhythm: Normal rate and regular rhythm.     Pulses: Normal pulses and intact distal pulses.     Heart sounds: No murmur heard.    No gallop.  Pulmonary:     Effort: Pulmonary effort is normal. No accessory muscle usage or respiratory distress.     Breath sounds: Normal breath sounds.  Abdominal:     General: Bowel sounds are normal.     Palpations: Abdomen is soft.     Hernia: A hernia (reducible) is present. Hernia is present in the ventral area.  Musculoskeletal:     Right lower leg: No edema.     Left lower leg: No  edema.    Laboratory examination:   Recent Labs    03/12/21 0816 11/19/21 0815 11/20/21 0213  NA 144 134* 136  K 4.3 4.1 4.1  CL 107* 102 105  CO2 23 22 23  GLUCOSE 105* 136* 123*  BUN 9 13 14  CREATININE 1.05 1.10 1.05  CALCIUM 8.6 9.4 8.8*  GFRNONAA  --  >60 >60    CrCl cannot be calculated (Patient's most recent lab result is older than the maximum 21 days allowed.).     Latest Ref Rng & Units 11/20/2021    2:13 AM 11/19/2021    8:15 AM 03/12/2021    8:16 AM  CMP  Glucose 70 - 99 mg/dL 123  136  105   BUN 8 - 23 mg/dL 14  13  9   Creatinine 0.61 - 1.24 mg/dL 1.05  1.10  1.05   Sodium 135 - 145 mmol/L 136  134  144   Potassium 3.5 - 5.1 mmol/L  4.1  4.1  4.3   Chloride 98 - 111 mmol/L 105  102  107   CO2 22 - 32 mmol/L 23  22  23   Calcium 8.9 - 10.3 mg/dL 8.8  9.4  8.6   Total Protein 6.0 - 8.5 g/dL   6.3   Total Bilirubin 0.0 - 1.2 mg/dL   0.4   Alkaline Phos 44 - 121 IU/L   60   AST 0 - 40 IU/L   21   ALT 0 - 44 IU/L   20       Latest Ref Rng & Units 11/20/2021    2:13 AM 11/19/2021    8:15 AM 03/12/2021    8:16 AM  CBC  WBC 4.0 - 10.5 K/uL 7.2  6.7  6.1   Hemoglobin 13.0 - 17.0 g/dL 12.5  13.4  12.8   Hematocrit 39.0 - 52.0 % 36.4  38.0  37.5   Platelets 150 - 400 K/uL 214  208  205    Lipid Panel Recent Labs    03/12/21 0815 07/25/21 0820 11/20/21 0213  CHOL 162 116 122  TRIG 88 59 132  LDLCALC 93 42 45  VLDL  --   --  26  HDL 53 61 51  CHOLHDL  --   --  2.4   HEMOGLOBIN A1C Lab Results  Component Value Date   HGBA1C 5.9 (H) 11/19/2021   MPG 122.63 11/19/2021   TSH Recent Labs    11/19/21 1419  TSH 1.281   Medications and allergies   Allergies  Allergen Reactions   Angiotensin Receptor Blockers Diarrhea   Beta Adrenergic Blockers Other (See Comments)    Fatigue   Zestril [Lisinopril] Rash     Current Outpatient Medications:    amLODipine (NORVASC) 5 MG tablet, Take 1 tablet (5 mg total) by mouth daily at 10 pm., Disp: 30 tablet, Rfl: 0   aspirin 81 MG EC tablet, Take 1 tablet (81 mg total) by mouth daily., Disp: 30 tablet, Rfl: 3   atorvastatin (LIPITOR) 80 MG tablet, Take 1 tablet (80 mg total) by mouth at bedtime., Disp: 90 tablet, Rfl: 1   cloNIDine (CATAPRES) 0.2 MG tablet, Take 1 tablet (0.2 mg total) by mouth 2 (two) times daily., Disp: 120 tablet, Rfl: 1   clopidogrel (PLAVIX) 75 MG tablet, Take 1 tablet (75 mg total) by mouth daily with breakfast., Disp: 90 tablet, Rfl: 1   ezetimibe (ZETIA) 10 MG tablet, Take 1 tablet (10 mg total) by   mouth daily., Disp: 100 tablet, Rfl: 3   isosorbide mononitrate (IMDUR) 30 MG 24 hr tablet, Take 1 tablet (30 mg total) by mouth every morning., Disp:  90 tablet, Rfl: 1   MAGNESIUM PO, Take 1 tablet by mouth at bedtime., Disp: , Rfl:    metoprolol succinate (TOPROL XL) 25 MG 24 hr tablet, Take 1 tablet (25 mg total) by mouth daily., Disp: 90 tablet, Rfl: 0   nitroGLYCERIN (NITROSTAT) 0.4 MG SL tablet, Place 1 tablet (0.4 mg total) under the tongue every 5 (five) minutes x 3 doses as needed for chest pain., Disp: 25 tablet, Rfl: 2   Omega-3 Fatty Acids (FISH OIL PO), Take 1 capsule by mouth daily., Disp: , Rfl:    omeprazole (PRILOSEC OTC) 20 MG tablet, Take 1 tablet (20 mg total) by mouth daily., Disp: 30 tablet, Rfl: 3   OVER THE COUNTER MEDICATION, Take 4 tablets by mouth daily. Total Beets., Disp: , Rfl:    OVER THE COUNTER MEDICATION, Take 3 capsules by mouth daily. Balance of Nature Veggies, Disp: , Rfl:    OVER THE COUNTER MEDICATION, Take 3 capsules by mouth daily. Balance of Nature Fruits, Disp: , Rfl:    spironolactone (ALDACTONE) 25 MG tablet, Take 1 tablet (25 mg total) by mouth daily., Disp: 100 tablet, Rfl: 3   tamsulosin (FLOMAX) 0.4 MG CAPS capsule, Take 0.4 mg by mouth daily., Disp: , Rfl:    Radiology:   No results found.  Cardiac Studies:   Coronary angiogram 01/15/2014: Subtotally occluded PDA, mild diffuse disease in the right coronary artery. Moderate disease in the proximal LAD - 40% and first diagonal branch large with 80% to 90% stenosis in mid segment. LVEF 45-50% with inferior severe hypokinesis. No significant mitral regurgitation. Diffuse coronary calcification.  Abdominal aortic duplex 04/06/2017: Normal abdominal aorta. No evidence of aneurysm.  Lexiscan myoview stress test 03/30/2018: 1. Lexiscan stress test was performed. Exercise capacity was not assessed. Stress symptoms included dyspnea. Peak effect blood pressure was 174/64 mmHg. Stress EKG is non diagnostic for ischemia as it is a pharmacologic stress. In addition, stress electrocardiogram showed sinus tachycardia, incomplete RBBB, no stress arrhythmias  and normal stress repolarization. 2. The overall quality of the study is fair. Left ventricular cavity is noted to be normal on the rest and stress studies. Gated SPECT images reveal normal myocardial thickening and wall motion. The left ventricular ejection fraction was calculated 41%, although visually appears normal. Small sized, mild intensity, reversible perfusion defect in inferior apical myocardium likely represents ischemia. In addition, there is basal inferior myocardium defect seen worse on rest images, which likely represents tissue attenuation artifact. Recommend clinical correlation of ejection fraction with echocardiogram. 3. Intermediate risk study.  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 Duplex suggests stenosis in the left internal carotid artery (16-49%). Right vertebral artery flow is not visualized. Antegrade left vertebral artery flow. Follow up in six months is appropriate if clinically indicated.  Echocardiogram 11/20/2021:  1. Left ventricular ejection fraction, by estimation, is 50 to 55%. The left ventricle has low normal function. The left ventricle has no regional wall motion abnormalities. There is mild left ventricular hypertrophy. Left ventricular diastolic  parameters were normal. The E/e' is 13.8.  2. Right ventricular systolic function is normal. The right ventricular size is normal.  3. The mitral valve is grossly normal. No evidence of mitral valve regurgitation. No evidence of mitral stenosis.  4. The aortic valve is   tricuspid. Aortic valve regurgitation is not visualized. Aortic valve sclerosis is present, with no evidence of aortic valve stenosis.   Comparison(s): Prior study 04/13/2018: LVEF 60-65%, Grade I diastolic dysfunction, elevated LAP, mild MR / TR.    Heart Catheterization 11/19/2021:   Mid LM lesion is 20% stenosed.   Prox LAD lesion is 40% stenosed.   Lat 1st Diag lesion is 80%  stenosed.   1st Diag lesion is 90% stenosed.   RPDA lesion is 80% stenosed.   LV end diastolic pressure is normal.   The left ventricular ejection fraction is 50-55% by visual estimate.   LM: Short vessel with 20% disease LAD: Diffuse 40% disease in prox-mid LAD with moderate calcification         Diag 1 bifurcates into a superior and an inferior branch                     Superior branch is medium caliber with 80% stenosis at bifurcation into two further tertiary branches. (Although severe, this was also reported in 2015, therefore likely a chronic stable lesion)                     Inferior branch is small caliber with 90% stenosis before bifurcation into further tertiary branches (Not present in 2015, therefore possibly culprit for today's event) Lcx: Medium caliber vessel with no significant disease RCA: Dominant, tortuous vessel           Small to medium caliber PDA with ostial-prox 80% stenosis (Although severe, this was also reported in 2015, therefore likely a chronic stable lesion)   Normal LVEDP, LVEF       Moderate to severe multivessel disease, primarily in secondary and tertiary branches Unchanged severe lesions in diag and RPDA, likely stable since 2015 Possibly culprit small inferior branch stenosis in diag is too small to intervene Trop mildly elevated and relatively flat, with hypertension being a possible insult   Recommend medical management at this time with up titration of anti anginal therapy, smoking cessation, risk factor modification.  EKG  EKG 12/15/2021: Normal sinus rhythm rate of 64 bpm, left axis with left anterior fascicular block, incomplete right bundle branch block.  Nonspecific T abnormality in the lateral leads.  Compared to 03/17/2021, no significant change.  Assessment     ICD-10-CM   1. Coronary artery disease of native artery of native heart with stable angina pectoris (HCC)  I25.118 EKG 12-Lead    2. Asymptomatic bilateral carotid artery  stenosis  I65.23     3. Primary hypertension  I10     4. Mixed hyperlipidemia  E78.2       No orders of the defined types were placed in this encounter.   There are no discontinued medications.   Recommendations:   Ronald [name]  is a 75 y.o. male  with HTN, Hyperlipidemia, admitted on 01/14/2014 with non-ST elevation myocardial infarction with subtotally occluded but diffusely diseased PDA branch of the right coronary artery which was left alone, moderate diffuse RCA disease and high grade D1 disease.  Past medical history significant for hypercholesterolemia, hypertension and multiple medication allergies and intolerances.  He also smokes cigars.  Patient presented to the emergency room on 11/19/2021 and discharged the following day with positive cardiac markers suggestive of NSTEMI.  Cardiac catheterization revealed small vessel disease and hence recommended medical therapy.  He now presents for follow-up.  Patient still continues to have class III angina pectoris in spite of   aggressive medical therapy.  I reviewed his angiogram personally, he has a very large diagonal that supplies a very large territory that has a high-grade 95 to 99% stenosis that is probably the culprit and needs angioplasty for the same.  We will schedule him for PCI. Schedule for cardiac catheterization, and possible angioplasty. We discussed regarding risks, benefits, alternatives to this including stress testing, CTA and continued medical therapy. Patient wants to proceed. Understands <1-2% risk of death, stroke, MI, urgent CABG, bleeding, infection, renal failure but not limited to these.  His wife is present and all questions answered.  I have reviewed his external labs, lipids and excellent control, renal function is normal.  Blood counts have remained stable.  His medications were refilled today.   [name], MD, [facility] 12/15/2021, 11:47 AM Office: [phone] Pager: [phone]

## 2021-12-15 NOTE — Patient Instructions (Signed)
01/19/2022 10:00 AM PCV-ECHO/VAS 1  Carotid duplex

## 2021-12-15 NOTE — Progress Notes (Signed)
Primary Physician/Referring:  Greta Doom, MD  Patient ID: Daron Offer, male    DOB: [birth year], 76 y.o.   MRN: [id number]  Chief Complaint  Patient presents with   Hospitalization Follow-up   Chest Pain   HPI:    Ronald [name]  is a 76 y.o. male  with HTN, Hyperlipidemia, admitted on 01/14/2014 with non-ST elevation myocardial infarction with subtotally occluded but diffusely diseased PDA branch of the right coronary artery which was left alone, moderate diffuse RCA disease and high grade D1 disease.  Past medical history significant for hypercholesterolemia, hypertension and multiple medication allergies and intolerances.  He also smokes cigars.  Patient presented to the emergency room on 11/19/2021 and discharged the following day with positive cardiac markers suggestive of NSTEMI.  Cardiac catheterization revealed small vessel disease and hence recommended medical therapy.  He now presents for follow-up.  Patient continues to have exertional chest pain, even doing minimal activities around the house has to sit down frequently and associated with marked dyspnea as well.  No rest pain, no PND or orthopnea.  Unfortunately still smokes.  Past Medical History:  Diagnosis Date   Bronchitis    Hypertension    Myocardial infarction [facility])    Past Surgical History:  Procedure Laterality Date   LEFT HEART CATH AND CORONARY ANGIOGRAPHY N/A 11/19/2021   Procedure: LEFT HEART CATH AND CORONARY ANGIOGRAPHY;  Surgeon: Elder Negus, MD;  Location: [facility] INVASIVE CV LAB;  Service: Cardiovascular;  Laterality: N/A;   LEFT HEART CATHETERIZATION WITH CORONARY ANGIOGRAM N/A 01/15/2014   Procedure: LEFT HEART CATHETERIZATION WITH CORONARY ANGIOGRAM;  Surgeon: Micheline Chapman, MD;  Location: [facility] CATH LAB;  Service: Cardiovascular;  Laterality: N/A;   History reviewed. No pertinent family history.  Social History   Tobacco Use   Smoking status: Every Day    Types: Cigars   Smokeless  tobacco: Never  Substance Use Topics   Alcohol use: Yes    Comment: beer occ   Marital Status: Married  ROS  Review of Systems  Cardiovascular:  Positive for chest pain and dyspnea on exertion. Negative for leg swelling.   Objective  Blood pressure (!) 102/56, pulse 64, resp. rate 16, height 5\' 8"  (1.727 m), weight 176 lb (79.8 kg), SpO2 96 %.     12/15/2021   11:29 AM 11/20/2021    8:02 AM 11/20/2021    6:03 AM  Vitals with BMI  Height 5\' 8"     Weight 176 lbs    BMI 26.77    Systolic 102 123 11/22/2021  Diastolic 56 68 60  Pulse 64 73 81     Physical Exam Constitutional:      Appearance: He is well-developed.  Neck:     Vascular: Carotid bruit (right) present. No JVD.  Cardiovascular:     Rate and Rhythm: Normal rate and regular rhythm.     Pulses: Normal pulses and intact distal pulses.     Heart sounds: No murmur heard.    No gallop.  Pulmonary:     Effort: Pulmonary effort is normal. No accessory muscle usage or respiratory distress.     Breath sounds: Normal breath sounds.  Abdominal:     General: Bowel sounds are normal.     Palpations: Abdomen is soft.     Hernia: A hernia (reducible) is present. Hernia is present in the ventral area.  Musculoskeletal:     Right lower leg: No edema.     Left lower leg: No  edema.    Laboratory examination:   Recent Labs    03/12/21 0816 11/19/21 0815 11/20/21 0213  NA 144 134* 136  K 4.3 4.1 4.1  CL 107* 102 105  CO2 23 22 23   GLUCOSE 105* 136* 123*  BUN 9 13 14   CREATININE 1.05 1.10 1.05  CALCIUM 8.6 9.4 8.8*  GFRNONAA  --  >60 >60    CrCl cannot be calculated (Patient's most recent lab result is older than the maximum 21 days allowed.).     Latest Ref Rng & Units 11/20/2021    2:13 AM 11/19/2021    8:15 AM 03/12/2021    8:16 AM  CMP  Glucose 70 - 99 mg/dL 123  136  105   BUN 8 - 23 mg/dL 14  13  9    Creatinine 0.61 - 1.24 mg/dL 1.05  1.10  1.05   Sodium 135 - 145 mmol/L 136  134  144   Potassium 3.5 - 5.1 mmol/L  4.1  4.1  4.3   Chloride 98 - 111 mmol/L 105  102  107   CO2 22 - 32 mmol/L 23  22  23    Calcium 8.9 - 10.3 mg/dL 8.8  9.4  8.6   Total Protein 6.0 - 8.5 g/dL   6.3   Total Bilirubin 0.0 - 1.2 mg/dL   0.4   Alkaline Phos 44 - 121 IU/L   60   AST 0 - 40 IU/L   21   ALT 0 - 44 IU/L   20       Latest Ref Rng & Units 11/20/2021    2:13 AM 11/19/2021    8:15 AM 03/12/2021    8:16 AM  CBC  WBC 4.0 - 10.5 K/uL 7.2  6.7  6.1   Hemoglobin 13.0 - 17.0 g/dL 12.5  13.4  12.8   Hematocrit 39.0 - 52.0 % 36.4  38.0  37.5   Platelets 150 - 400 K/uL 214  208  205    Lipid Panel Recent Labs    03/12/21 0815 07/25/21 0820 11/20/21 0213  CHOL 162 116 122  TRIG 88 59 132  LDLCALC 93 42 45  VLDL  --   --  26  HDL 53 61 51  CHOLHDL  --   --  2.4   HEMOGLOBIN A1C Lab Results  Component Value Date   HGBA1C 5.9 (H) 11/19/2021   MPG 122.63 11/19/2021   TSH Recent Labs    11/19/21 1419  TSH 1.281   Medications and allergies   Allergies  Allergen Reactions   Angiotensin Receptor Blockers Diarrhea   Beta Adrenergic Blockers Other (See Comments)    Fatigue   Zestril [Lisinopril] Rash     Current Outpatient Medications:    amLODipine (NORVASC) 5 MG tablet, Take 1 tablet (5 mg total) by mouth daily at 10 pm., Disp: 30 tablet, Rfl: 0   aspirin 81 MG EC tablet, Take 1 tablet (81 mg total) by mouth daily., Disp: 30 tablet, Rfl: 3   atorvastatin (LIPITOR) 80 MG tablet, Take 1 tablet (80 mg total) by mouth at bedtime., Disp: 90 tablet, Rfl: 1   cloNIDine (CATAPRES) 0.2 MG tablet, Take 1 tablet (0.2 mg total) by mouth 2 (two) times daily., Disp: 120 tablet, Rfl: 1   clopidogrel (PLAVIX) 75 MG tablet, Take 1 tablet (75 mg total) by mouth daily with breakfast., Disp: 90 tablet, Rfl: 1   ezetimibe (ZETIA) 10 MG tablet, Take 1 tablet (10 mg total) by  mouth daily., Disp: 100 tablet, Rfl: 3   isosorbide mononitrate (IMDUR) 30 MG 24 hr tablet, Take 1 tablet (30 mg total) by mouth every morning., Disp:  90 tablet, Rfl: 1   MAGNESIUM PO, Take 1 tablet by mouth at bedtime., Disp: , Rfl:    metoprolol succinate (TOPROL XL) 25 MG 24 hr tablet, Take 1 tablet (25 mg total) by mouth daily., Disp: 90 tablet, Rfl: 0   nitroGLYCERIN (NITROSTAT) 0.4 MG SL tablet, Place 1 tablet (0.4 mg total) under the tongue every 5 (five) minutes x 3 doses as needed for chest pain., Disp: 25 tablet, Rfl: 2   Omega-3 Fatty Acids (FISH OIL PO), Take 1 capsule by mouth daily., Disp: , Rfl:    omeprazole (PRILOSEC OTC) 20 MG tablet, Take 1 tablet (20 mg total) by mouth daily., Disp: 30 tablet, Rfl: 3   OVER THE COUNTER MEDICATION, Take 4 tablets by mouth daily. Total Beets., Disp: , Rfl:    OVER THE COUNTER MEDICATION, Take 3 capsules by mouth daily. Balance of [employer], Disp: , Rfl:    OVER THE COUNTER MEDICATION, Take 3 capsules by mouth daily. Balance of [employer], Disp: , Rfl:    spironolactone (ALDACTONE) 25 MG tablet, Take 1 tablet (25 mg total) by mouth daily., Disp: 100 tablet, Rfl: 3   tamsulosin (FLOMAX) 0.4 MG CAPS capsule, Take 0.4 mg by mouth daily., Disp: , Rfl:    Radiology:   No results found.  Cardiac Studies:   Coronary angiogram 01/15/2014: Subtotally occluded PDA, mild diffuse disease in the right coronary artery. Moderate disease in the proximal LAD - 40% and first diagonal branch large with 80% to 90% stenosis in mid segment. LVEF 45-50% with inferior severe hypokinesis. No significant mitral regurgitation. Diffuse coronary calcification.  Abdominal aortic duplex 04/06/2017: Normal abdominal aorta. No evidence of aneurysm.  Lexiscan myoview stress test 03/30/2018: 1. Lexiscan stress test was performed. Exercise capacity was not assessed. Stress symptoms included dyspnea. Peak effect blood pressure was 174/64 mmHg. Stress EKG is non diagnostic for ischemia as it is a pharmacologic stress. In addition, stress electrocardiogram showed sinus tachycardia, incomplete RBBB, no stress arrhythmias  and normal stress repolarization. 2. The overall quality of the study is fair. Left ventricular cavity is noted to be normal on the rest and stress studies. Gated SPECT images reveal normal myocardial thickening and wall motion. The left ventricular ejection fraction was calculated 41%, although visually appears normal. Small sized, mild intensity, reversible perfusion defect in inferior apical myocardium likely represents ischemia. In addition, there is basal inferior myocardium defect seen worse on rest images, which likely represents tissue attenuation artifact. Recommend clinical correlation of ejection fraction with echocardiogram. 3. Intermediate risk study.  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 Duplex suggests stenosis in the left internal carotid artery (16-49%). Right vertebral artery flow is not visualized. Antegrade left vertebral artery flow. Follow up in six months is appropriate if clinically indicated.  Echocardiogram 11/20/2021:  1. Left ventricular ejection fraction, by estimation, is 50 to 55%. The left ventricle has low normal function. The left ventricle has no regional wall motion abnormalities. There is mild left ventricular hypertrophy. Left ventricular diastolic  parameters were normal. The E/e' is 13.8.  2. Right ventricular systolic function is normal. The right ventricular size is normal.  3. The mitral valve is grossly normal. No evidence of mitral valve regurgitation. No evidence of mitral stenosis.  4. The aortic valve is  tricuspid. Aortic valve regurgitation is not visualized. Aortic valve sclerosis is present, with no evidence of aortic valve stenosis.   Comparison(s): Prior study 04/13/2018: LVEF [id number], Grade I diastolic dysfunction, elevated LAP, mild MR / TR.    Heart Catheterization 11/19/2021:   Mid LM lesion is 20% stenosed.   Prox LAD lesion is 40% stenosed.   Lat 1st Diag lesion is 80%  stenosed.   1st Diag lesion is 90% stenosed.   RPDA lesion is 80% stenosed.   LV end diastolic pressure is normal.   The left ventricular ejection fraction is 50-55% by visual estimate.   LM: Short vessel with 20% disease LAD: Diffuse 40% disease in prox-mid LAD with moderate calcification         Diag 1 bifurcates into a superior and an inferior branch                     Superior branch is medium caliber with 80% stenosis at bifurcation into two further tertiary branches. (Although severe, this was also reported in 2015, therefore likely a chronic stable lesion)                     Inferior branch is small caliber with 90% stenosis before bifurcation into further tertiary branches (Not present in 2015, therefore possibly culprit for today's event) Lcx: Medium caliber vessel with no significant disease RCA: Dominant, tortuous vessel           Small to medium caliber PDA with ostial-prox 80% stenosis (Although severe, this was also reported in 2015, therefore likely a chronic stable lesion)   Normal LVEDP, LVEF       Moderate to severe multivessel disease, primarily in secondary and tertiary branches Unchanged severe lesions in diag and RPDA, likely stable since 2015 Possibly culprit small inferior branch stenosis in diag is too small to intervene Trop mildly elevated and relatively flat, with hypertension being a possible insult   Recommend medical management at this time with up titration of anti anginal therapy, smoking cessation, risk factor modification.  EKG  EKG 12/15/2021: Normal sinus rhythm rate of 64 bpm, left axis with left anterior fascicular block, incomplete right bundle branch block.  Nonspecific T abnormality in the lateral leads.  Compared to 03/17/2021, no significant change.  Assessment     ICD-10-CM   1. Coronary artery disease of native artery of native heart with stable angina pectoris ([place])  I25.118 EKG 12-Lead    2. Asymptomatic bilateral carotid artery  stenosis  I65.23     3. Primary hypertension  I10     4. Mixed hyperlipidemia  E78.2       No orders of the defined types were placed in this encounter.   There are no discontinued medications.   Recommendations:   Ronald [name]  is a 76 y.o. male  with HTN, Hyperlipidemia, admitted on 01/14/2014 with non-ST elevation myocardial infarction with subtotally occluded but diffusely diseased PDA branch of the right coronary artery which was left alone, moderate diffuse RCA disease and high grade D1 disease.  Past medical history significant for hypercholesterolemia, hypertension and multiple medication allergies and intolerances.  He also smokes cigars.  Patient presented to the emergency room on 11/19/2021 and discharged the following day with positive cardiac markers suggestive of NSTEMI.  Cardiac catheterization revealed small vessel disease and hence recommended medical therapy.  He now presents for follow-up.  Patient still continues to have class III angina pectoris in spite of  aggressive medical therapy.  I reviewed his angiogram personally, he has a very large diagonal that supplies a very large territory that has a high-grade 95 to 99% stenosis that is probably the culprit and needs angioplasty for the same.  We will schedule him for PCI. Schedule for cardiac catheterization, and possible angioplasty. We discussed regarding risks, benefits, alternatives to this including stress testing, CTA and continued medical therapy. Patient wants to proceed. Understands <1-2% risk of death, stroke, MI, urgent CABG, bleeding, infection, renal failure but not limited to these.  His wife is present and all questions answered.  I have reviewed his external labs, lipids and excellent control, renal function is normal.  Blood counts have remained stable.  His medications were refilled today.   [name], MD, [facility] 12/15/2021, 11:47 AM Office: [phone] Pager: [phone]

## 2021-12-16 ENCOUNTER — Other Ambulatory Visit: Payer: Self-pay

## 2021-12-17 ENCOUNTER — Telehealth: Payer: Self-pay | Admitting: Cardiology

## 2021-12-17 ENCOUNTER — Other Ambulatory Visit: Payer: Self-pay

## 2021-12-17 MED ORDER — AMLODIPINE BESYLATE 5 MG PO TABS: 5.0000 mg | ORAL_TABLET | Freq: Every day | ORAL | 3 refills | Status: DC

## 2021-12-17 NOTE — Telephone Encounter (Signed)
Patient needs refill on norvasc.

## 2021-12-17 NOTE — Telephone Encounter (Signed)
It has been sent to the [place]

## 2021-12-22 ENCOUNTER — Other Ambulatory Visit: Payer: Self-pay

## 2021-12-22 ENCOUNTER — Telehealth: Payer: Self-pay

## 2021-12-22 MED ORDER — AMLODIPINE BESYLATE 5 MG PO TABS: 5.0000 mg | ORAL_TABLET | Freq: Every day | ORAL | 3 refills | Status: AC

## 2021-12-22 MED ORDER — AMLODIPINE BESYLATE 5 MG PO TABS: 5.0000 mg | ORAL_TABLET | Freq: Every day | ORAL | 3 refills | Status: DC

## 2021-12-22 NOTE — Telephone Encounter (Signed)
VM 1110: Patient daughter called and left a message requesting refills.  Message is as follows:  "yes this is [name] and I'm calling about Ronald [name] [phone] and I called twice last week and he needs his Norv ask 5 milligram called in to [facility] at [place]. the medicine that was called in was the wrong medication already have that one I just need the door bath if you can call me at work and let me know that you got this message he's been out of the blood pressure medicine my phone number at work is [phone] thanks.   A refill was sent on 12/17/2021, but [facility] which I have printed and re-faxed. I tried calling Misty Stanley back to let her know, NA, LMAM.

## 2021-12-23 ENCOUNTER — Other Ambulatory Visit: Payer: Self-pay

## 2021-12-23 ENCOUNTER — Ambulatory Visit (HOSPITAL_COMMUNITY)
Admission: RE | Admit: 2021-12-23 | Discharge: 2021-12-23 | Disposition: A | Discharge status: Home / Self Care | Payer: No Typology Code available for payment source | Attending: Cardiology | Admitting: Cardiology

## 2021-12-23 ENCOUNTER — Encounter (HOSPITAL_COMMUNITY)
Admission: RE | Disposition: A | Discharge status: Home / Self Care | Payer: Self-pay | Source: Home / Self Care | Attending: Cardiology

## 2021-12-23 DIAGNOSIS — I2582 Chronic total occlusion of coronary artery: Secondary | ICD-10-CM | POA: Insufficient documentation

## 2021-12-23 DIAGNOSIS — E782 Mixed hyperlipidemia: Secondary | ICD-10-CM | POA: Insufficient documentation

## 2021-12-23 DIAGNOSIS — I25110 Atherosclerotic heart disease of native coronary artery with unstable angina pectoris: Secondary | ICD-10-CM

## 2021-12-23 DIAGNOSIS — I252 Old myocardial infarction: Secondary | ICD-10-CM | POA: Diagnosis not present

## 2021-12-23 DIAGNOSIS — I25118 Atherosclerotic heart disease of native coronary artery with other forms of angina pectoris: Secondary | ICD-10-CM | POA: Insufficient documentation

## 2021-12-23 DIAGNOSIS — I10 Essential (primary) hypertension: Secondary | ICD-10-CM | POA: Diagnosis not present

## 2021-12-23 DIAGNOSIS — Z955 Presence of coronary angioplasty implant and graft: Secondary | ICD-10-CM | POA: Insufficient documentation

## 2021-12-23 DIAGNOSIS — I2510 Atherosclerotic heart disease of native coronary artery without angina pectoris: Secondary | ICD-10-CM | POA: Diagnosis present

## 2021-12-23 DIAGNOSIS — I6523 Occlusion and stenosis of bilateral carotid arteries: Secondary | ICD-10-CM | POA: Insufficient documentation

## 2021-12-23 DIAGNOSIS — F17290 Nicotine dependence, other tobacco product, uncomplicated: Secondary | ICD-10-CM | POA: Diagnosis not present

## 2021-12-23 DIAGNOSIS — I2584 Coronary atherosclerosis due to calcified coronary lesion: Secondary | ICD-10-CM | POA: Insufficient documentation

## 2021-12-23 DIAGNOSIS — I209 Angina pectoris, unspecified: Secondary | ICD-10-CM

## 2021-12-23 HISTORY — PX: CORONARY STENT INTERVENTION: CATH118234

## 2021-12-23 LAB — BASIC METABOLIC PANEL
Anion gap: 7 (ref 5–15)
BUN: 12 mg/dL (ref 8–23)
CO2: 24 mmol/L (ref 22–32)
Calcium: 8.7 mg/dL — ABNORMAL LOW (ref 8.9–10.3)
Chloride: 105 mmol/L (ref 98–111)
Creatinine, Ser: 0.95 mg/dL (ref 0.61–1.24)
GFR, Estimated: >60 mL/min (ref >60)
Glucose, Bld: 113 mg/dL — ABNORMAL HIGH (ref 70–99)
Potassium: 4.7 mmol/L (ref 3.5–5.1)
Sodium: 136 mmol/L (ref 135–145)

## 2021-12-23 LAB — CBC
HCT: 36.6 % — ABNORMAL LOW (ref 39.0–52.0)
Hemoglobin: 12.3 g/dL — ABNORMAL LOW (ref 13.0–17.0)
MCH: 31.9 pg (ref 26.0–34.0)
MCHC: 33.6 g/dL (ref 30.0–36.0)
MCV: 94.8 fL (ref 80.0–100.0)
Platelets: 186 10*3/uL (ref 150–400)
RBC: 3.86 MIL/uL — ABNORMAL LOW (ref 4.22–5.81)
RDW: 12.1 % (ref 11.5–15.5)
WBC: 5.7 10*3/uL (ref 4.0–10.5)
nRBC: 0 % (ref 0.0–0.2)

## 2021-12-23 LAB — POCT ACTIVATED CLOTTING TIME: Activated Clotting Time: 305 seconds

## 2021-12-23 SURGERY — CORONARY STENT INTERVENTION
Anesthesia: LOCAL

## 2021-12-23 MED ORDER — SODIUM CHLORIDE 0.9% FLUSH
3.0000 mL | Freq: Two times a day (BID) | INTRAVENOUS | Status: DC
Start: 2021-12-23 — End: 2021-12-23

## 2021-12-23 MED ORDER — SODIUM CHLORIDE 0.9% FLUSH: 3.0000 mL | Freq: Two times a day (BID) | INTRAVENOUS | Status: DC

## 2021-12-23 MED ORDER — LIDOCAINE HCL (PF) 1 % IJ SOLN
INTRAMUSCULAR | Status: DC | PRN
  Administered 2021-12-23: 2 mL

## 2021-12-23 MED ORDER — FENTANYL CITRATE (PF) 100 MCG/2ML IJ SOLN
INTRAMUSCULAR | Status: AC
  Filled 2021-12-23: qty 2

## 2021-12-23 MED ORDER — SODIUM CHLORIDE 0.9 % WEIGHT BASED INFUSION: 1.0000 mL/kg/h | INTRAVENOUS | Status: DC

## 2021-12-23 MED ORDER — MIDAZOLAM HCL 2 MG/2ML IJ SOLN
INTRAMUSCULAR | Status: AC
  Filled 2021-12-23: qty 2

## 2021-12-23 MED ORDER — HEPARIN (PORCINE) IN NACL 1000-0.9 UT/500ML-% IV SOLN
INTRAVENOUS | Status: DC | PRN
  Administered 2021-12-23 (×2): 500 mL

## 2021-12-23 MED ORDER — HEPARIN SODIUM (PORCINE) 1000 UNIT/ML IJ SOLN
INTRAMUSCULAR | Status: AC
  Filled 2021-12-23: qty 10

## 2021-12-23 MED ORDER — ASPIRIN 81 MG PO CHEW: 81.0000 mg | CHEWABLE_TABLET | ORAL | Status: DC

## 2021-12-23 MED ORDER — SODIUM CHLORIDE 0.9 % IV SOLN: 250.0000 mL | INTRAVENOUS | Status: DC | PRN

## 2021-12-23 MED ORDER — MIDAZOLAM HCL 2 MG/2ML IJ SOLN
INTRAMUSCULAR | Status: DC | PRN
  Administered 2021-12-23: 2 mg via INTRAVENOUS

## 2021-12-23 MED ORDER — IOHEXOL 350 MG/ML SOLN
INTRAVENOUS | Status: DC | PRN
  Administered 2021-12-23: 80 mL

## 2021-12-23 MED ORDER — HEPARIN (PORCINE) IN NACL 1000-0.9 UT/500ML-% IV SOLN
INTRAVENOUS | Status: AC
Start: 2021-12-23 — End: ?
  Filled 2021-12-23: qty 1000

## 2021-12-23 MED ORDER — VERAPAMIL HCL 2.5 MG/ML IV SOLN
INTRAVENOUS | Status: DC | PRN
  Administered 2021-12-23: 5 mL via INTRA_ARTERIAL

## 2021-12-23 MED ORDER — SODIUM CHLORIDE 0.9 % IV SOLN
250.0000 mL | INTRAVENOUS | Status: DC | PRN
Start: 2021-12-23 — End: 2021-12-23

## 2021-12-23 MED ORDER — VERAPAMIL HCL 2.5 MG/ML IV SOLN
INTRAVENOUS | Status: AC
  Filled 2021-12-23: qty 2

## 2021-12-23 MED ORDER — NITROGLYCERIN 1 MG/10 ML FOR IR/CATH LAB
INTRA_ARTERIAL | Status: AC
  Filled 2021-12-23: qty 10

## 2021-12-23 MED ORDER — NITROGLYCERIN 1 MG/10 ML FOR IR/CATH LAB
INTRA_ARTERIAL | Status: DC | PRN
  Administered 2021-12-23: 200 ug via INTRACORONARY

## 2021-12-23 MED ORDER — HEPARIN SODIUM (PORCINE) 1000 UNIT/ML IJ SOLN
INTRAMUSCULAR | Status: DC | PRN
  Administered 2021-12-23: 8000 [IU] via INTRAVENOUS

## 2021-12-23 MED ORDER — SODIUM CHLORIDE 0.9% FLUSH: 3.0000 mL | INTRAVENOUS | Status: DC | PRN

## 2021-12-23 MED ORDER — SODIUM CHLORIDE 0.9 % WEIGHT BASED INFUSION
3.0000 mL/kg/h | INTRAVENOUS | Status: DC
  Administered 2021-12-23: 3 mL/kg/h via INTRAVENOUS

## 2021-12-23 MED ORDER — ONDANSETRON HCL 4 MG/2ML IJ SOLN: 4.0000 mg | Freq: Four times a day (QID) | INTRAMUSCULAR | Status: DC | PRN

## 2021-12-23 MED ORDER — LIDOCAINE HCL (PF) 1 % IJ SOLN
INTRAMUSCULAR | Status: AC
  Filled 2021-12-23: qty 30

## 2021-12-23 MED ORDER — ASPIRIN 81 MG PO CHEW
CHEWABLE_TABLET | ORAL | Status: AC
  Filled 2021-12-23: qty 1

## 2021-12-23 MED ORDER — FENTANYL CITRATE (PF) 100 MCG/2ML IJ SOLN
INTRAMUSCULAR | Status: DC | PRN
  Administered 2021-12-23: 50 ug via INTRAVENOUS

## 2021-12-23 MED ORDER — ACETAMINOPHEN 325 MG PO TABS: 650.0000 mg | ORAL_TABLET | ORAL | Status: DC | PRN

## 2021-12-23 SURGICAL SUPPLY — 15 items
BALLN SAPPHIRE 2.0X20 (BALLOONS) ×1
BALLOON SAPPHIRE 2.0X20 (BALLOONS) IMPLANT
CATH VISTA GUIDE 6FR XB3.5 (CATHETERS) IMPLANT
DEVICE RAD COMP TR BAND LRG (VASCULAR PRODUCTS) IMPLANT
ELECT DEFIB PAD ADLT CADENCE (PAD) IMPLANT
GLIDESHEATH SLEND A-KIT 6F 22G (SHEATH) IMPLANT
GUIDEWIRE INQWIRE 1.5J.035X260 (WIRE) IMPLANT
INQWIRE 1.5J .035X260CM (WIRE) ×1
KIT ENCORE 26 ADVANTAGE (KITS) IMPLANT
KIT HEART LEFT (KITS) ×1 IMPLANT
PACK CARDIAC CATHETERIZATION (CUSTOM PROCEDURE TRAY) ×1 IMPLANT
STENT ONYX FRONTIER 2.25X12 (Permanent Stent) IMPLANT
TRANSDUCER W/STOPCOCK (MISCELLANEOUS) ×1 IMPLANT
TUBING CIL FLEX 10 FLL-RA (TUBING) ×1 IMPLANT
WIRE COUGAR XT STRL 190CM (WIRE) IMPLANT

## 2021-12-23 NOTE — Progress Notes (Signed)
CARDIAC REHAB :      Pt post stent teaching including exercise guidelines, risk factors, antiplatelet therapy importance, restrictions, risk factors,heart healthy diet, site care, and CRP2. Will refer to [facility] for CRP2. All questions and concerns addressed. Plan for discharge today.   1315-1400   Woodroe Chen, RN BSN 12/23/2021 1:50 PM

## 2021-12-23 NOTE — Discharge Instructions (Signed)

## 2021-12-23 NOTE — Interval H&P Note (Signed)
History and Physical Interval Note:  12/23/2021 11:52 AM  Ronald [name]  has presented today for surgery, with the diagnosis of cad.  The various methods of treatment have been discussed with the patient and family. After consideration of risks, benefits and other options for treatment, the patient has consented to  Procedure(s): CORONARY STENT INTERVENTION (N/A) as a surgical intervention.  The patient's history has been reviewed, patient examined, no change in status, stable for surgery.  I have reviewed the patient's chart and labs.  Questions were answered to the patient's satisfaction.    Cath Lab Visit (complete for each Cath Lab visit)  Clinical Evaluation Leading to the Procedure:   ACS: Yes.    Non-ACS:    Anginal Classification: CCS IV  Anti-ischemic medical therapy: Maximal Therapy (2 or more classes of medications)  Non-Invasive Test Results: No non-invasive testing performed  Prior CABG: Previous CABG   [name]

## 2021-12-23 NOTE — Interval H&P Note (Signed)
History and Physical Interval Note:  12/23/2021 11:58 AM  Ronald [name]  has presented today for surgery, with the diagnosis of cad.  The various methods of treatment have been discussed with the patient and family. After consideration of risks, benefits and other options for treatment, the patient has consented to  Procedure(s): CORONARY STENT INTERVENTION (N/A) as a surgical intervention.  The patient's history has been reviewed, patient examined, no change in status, stable for surgery.  I have reviewed the patient's chart and labs.  Questions were answered to the patient's satisfaction.    Cath Lab Visit (complete for each Cath Lab visit)  Clinical Evaluation Leading to the Procedure:   ACS: Yes.    Non-ACS:    Anginal Classification: CCS IV  Anti-ischemic medical therapy: Maximal Therapy (2 or more classes of medications)  Non-Invasive Test Results: No non-invasive testing performed  Prior CABG: No previous CABG   [name]

## 2021-12-23 NOTE — Progress Notes (Signed)
Patient was given discharge instructions. He verbalized understanding. 

## 2021-12-23 NOTE — Progress Notes (Signed)
TR BAND REMOVAL  LOCATION:    right radial  DEFLATED PER PROTOCOL:    Yes.    TIME BAND OFF / DRESSING APPLIED:    1600   SITE UPON ARRIVAL:    Level 0  SITE AFTER BAND REMOVAL:    Level 0  CIRCULATION SENSATION AND MOVEMENT:    Within Normal Limits   Yes.    COMMENTS:   No bleeding noted  

## 2021-12-24 ENCOUNTER — Encounter (HOSPITAL_COMMUNITY): Payer: Self-pay | Admitting: Cardiology

## 2021-12-26 ENCOUNTER — Other Ambulatory Visit: Payer: Self-pay

## 2021-12-26 MED ORDER — ATORVASTATIN CALCIUM 80 MG PO TABS: 80.0000 mg | ORAL_TABLET | Freq: Every day | ORAL | 3 refills | Status: DC

## 2022-01-08 ENCOUNTER — Other Ambulatory Visit: Payer: Self-pay

## 2022-01-08 MED ORDER — ATORVASTATIN CALCIUM 80 MG PO TABS: 80.0000 mg | ORAL_TABLET | Freq: Every day | ORAL | 3 refills | Status: DC

## 2022-01-12 ENCOUNTER — Other Ambulatory Visit: Payer: Self-pay | Admitting: Cardiology

## 2022-01-12 DIAGNOSIS — I25118 Atherosclerotic heart disease of native coronary artery with other forms of angina pectoris: Secondary | ICD-10-CM

## 2022-01-12 NOTE — Telephone Encounter (Signed)
Done I printed them

## 2022-01-12 NOTE — Telephone Encounter (Signed)
Can you print these 3 prescriptions, and I will fax them? Please and thank you!

## 2022-01-19 ENCOUNTER — Other Ambulatory Visit: Payer: Non-veteran care

## 2022-01-19 ENCOUNTER — Ambulatory Visit: Payer: Medicare Other

## 2022-01-19 DIAGNOSIS — I6523 Occlusion and stenosis of bilateral carotid arteries: Secondary | ICD-10-CM

## 2022-01-27 ENCOUNTER — Ambulatory Visit: Payer: Medicare Other | Admitting: Cardiology

## 2022-01-27 ENCOUNTER — Encounter: Payer: Self-pay | Admitting: Cardiology

## 2022-01-27 VITALS — BP 126/65 | HR 68 | Temp 98.4°F | Resp 16 | Ht 72.0 in | Wt 173.0 lb

## 2022-01-27 DIAGNOSIS — I10 Essential (primary) hypertension: Secondary | ICD-10-CM

## 2022-01-27 DIAGNOSIS — I25118 Atherosclerotic heart disease of native coronary artery with other forms of angina pectoris: Secondary | ICD-10-CM

## 2022-01-27 DIAGNOSIS — E782 Mixed hyperlipidemia: Secondary | ICD-10-CM

## 2022-01-27 DIAGNOSIS — I6523 Occlusion and stenosis of bilateral carotid arteries: Secondary | ICD-10-CM

## 2022-01-27 NOTE — Progress Notes (Deleted)
Primary Physician/Referring:  Zachery Dauer, MD  Patient ID: Ronald [name], male    DOB: 1945-10-30, 76 y.o.   MRN: [id number]  No chief complaint on file.  HPI:    Ronald [name]  is a 76 y.o. male  with HTN, Hyperlipidemia, admitted on 01/14/2014 with non-ST elevation myocardial infarction with subtotally occluded but diffusely diseased PDA branch of the right coronary artery which was left alone, moderate diffuse RCA disease and high grade D1 disease.  Past medical history significant for hypercholesterolemia, hypertension and multiple medication allergies and intolerances.  He also smokes cigars.  Patient presented to the emergency room on 11/19/2021 and discharged the following day with positive cardiac markers suggestive of NSTEMI.  Cardiac catheterization revealed small vessel disease and hence recommended medical therapy.  He now presents for follow-up.  Patient continues to have exertional chest pain, even doing minimal activities around the house has to sit down frequently and associated with marked dyspnea as well.  No rest pain, no PND or orthopnea.  Unfortunately still smokes.  Past Medical History:  Diagnosis Date   Bronchitis    Hypertension    Myocardial infarction [facility])    Past Surgical History:  Procedure Laterality Date   CORONARY STENT INTERVENTION N/A 12/23/2021   Procedure: CORONARY STENT INTERVENTION;  Surgeon: Adrian Prows, MD;  Location: [place] [facility];  Service: Cardiovascular;  Laterality: N/A;   LEFT HEART CATH AND CORONARY ANGIOGRAPHY N/A 11/19/2021   Procedure: LEFT HEART CATH AND CORONARY ANGIOGRAPHY;  Surgeon: Nigel Mormon, MD;  Location: [place] [facility];  Service: Cardiovascular;  Laterality: N/A;   LEFT HEART CATHETERIZATION WITH CORONARY ANGIOGRAM N/A 01/15/2014   Procedure: LEFT HEART CATHETERIZATION WITH CORONARY ANGIOGRAM;  Surgeon: Blane Ohara, MD;  Location: [facility] CATH LAB;  Service: Cardiovascular;  Laterality: N/A;   No family  history on file.  Social History   Tobacco Use   Smoking status: Every Day    Types: Cigars   Smokeless tobacco: Never  Substance Use Topics   Alcohol use: Yes    Comment: beer occ   Marital Status: Married  ROS  Review of Systems  Cardiovascular:  Positive for chest pain and dyspnea on exertion. Negative for leg swelling.   Objective  There were no vitals taken for this visit.     12/23/2021    6:00 PM 12/23/2021    5:45 PM 12/23/2021    5:31 PM  Vitals with BMI  Systolic 784 696 295  Diastolic 67 69 72  Pulse 67 71 71     Physical Exam Constitutional:      Appearance: He is well-developed.  Neck:     Vascular: Carotid bruit (right) present. No JVD.  Cardiovascular:     Rate and Rhythm: Normal rate and regular rhythm.     Pulses: Normal pulses and intact distal pulses.     Heart sounds: No murmur heard.    No gallop.  Pulmonary:     Effort: Pulmonary effort is normal. No accessory muscle usage or respiratory distress.     Breath sounds: Normal breath sounds.  Abdominal:     General: Bowel sounds are normal.     Palpations: Abdomen is soft.     Hernia: A hernia (reducible) is present. Hernia is present in the ventral area.  Musculoskeletal:     Right lower leg: No edema.     Left lower leg: No edema.    Laboratory examination:   Recent Labs  11/19/21 0815 11/20/21 0213 12/23/21 1036  NA 134* 136 136  K 4.1 4.1 4.7  CL 102 105 105  CO2 22 23 24   GLUCOSE 136* 123* 113*  BUN 13 14 12   CREATININE 1.10 1.05 0.95  CALCIUM 9.4 8.8* 8.7*  GFRNONAA >60 >60 >60    CrCl cannot be calculated (Patient's most recent lab result is older than the maximum 21 days allowed.).     Latest Ref Rng & Units 12/23/2021   10:36 AM 11/20/2021    2:13 AM 11/19/2021    8:15 AM  CMP  Glucose 70 - 99 mg/dL 11/22/2021  11/21/2021  366   BUN 8 - 23 mg/dL 12  14  13    Creatinine 0.61 - 1.24 mg/dL 440  347    Sodium 135 - 145 mmol/L 136  136  134   Potassium 3.5 - 5.1 mmol/L 4.7  4.1   4.1   Chloride 98 - 111 mmol/L 105  105  102   CO2 22 - 32 mmol/L 24  23  22    Calcium 8.9 - 10.3 mg/dL 8.7  8.8  9.4       Latest Ref Rng & Units 12/23/2021   10:36 AM 11/20/2021    2:13 AM 11/19/2021    8:15 AM  CBC  WBC 4.0 - 10.5 K/uL 5.7  7.2  6.7   Hemoglobin 13.0 - 17.0 g/dL  12/25/2021  11/22/2021   Hematocrit 39.0 - 52.0 % 36.6  36.4  38.0   Platelets 150 - 400 K/uL 186  214  208    Lipid Panel Recent Labs    03/12/21 0815 07/25/21 0820 11/20/21 0213  CHOL 162 116 122  TRIG 88 59 132  LDLCALC 93 42 45  VLDL  --   --  26  HDL 53 61 51  CHOLHDL  --   --  2.4   HEMOGLOBIN A1C Lab Results  Component Value Date   HGBA1C 5.9 (H) 11/19/2021   MPG 122.63 11/19/2021   TSH Recent Labs    11/19/21 1419  TSH 1.281    Medications and allergies   Allergies  Allergen Reactions   Angiotensin Receptor Blockers Diarrhea   Beta Adrenergic Blockers Other (See Comments)    Fatigue   Zestril [Lisinopril] Rash     Current Outpatient Medications:    amLODipine (NORVASC) 5 MG tablet, Take 1 tablet (5 mg total) by mouth daily at 10 pm., Disp: 30 tablet, Rfl: 3   aspirin 81 MG EC tablet, Take 1 tablet (81 mg total) by mouth daily., Disp: 30 tablet, Rfl: 3   atorvastatin (LIPITOR) 80 MG tablet, Take 1 tablet (80 mg total) by mouth at bedtime., Disp: 90 tablet, Rfl: 3   cetirizine (ZYRTEC) 10 MG tablet, Take 10 mg by mouth daily as needed for allergies., Disp: , Rfl:    cloNIDine (CATAPRES) 0.2 MG tablet, Take 1 tablet (0.2 mg total) by mouth 2 (two) times daily., Disp: 120 tablet, Rfl: 1   clopidogrel (PLAVIX) 75 MG tablet, TAKE 1 TABLET BY MOUTH DAILY WITH BREAKFAST, Disp: 30 tablet, Rfl: 1   diphenhydramine-acetaminophen (TYLENOL PM) 25-500 MG TABS tablet, Take 1 tablet by mouth at bedtime., Disp: , Rfl:    ezetimibe (ZETIA) 10 MG tablet, Take 1 tablet (10 mg total) by mouth daily., Disp: 100 tablet, Rfl: 3   isosorbide mononitrate (IMDUR) 30 MG 24 hr tablet, TAKE ONE TABLET BY  MOUTH EVERY MORNING, Disp: 30 tablet, Rfl: 1  MAGNESIUM PO, Take 1 tablet by mouth at bedtime., Disp: , Rfl:    metoprolol succinate (TOPROL-XL) 25 MG 24 hr tablet, TAKE 1 TABLET BY MOUTH DAILY, Disp: 30 tablet, Rfl: 1   nitroGLYCERIN (NITROSTAT) 0.4 MG SL tablet, Place 1 tablet (0.4 mg total) under the tongue every 5 (five) minutes x 3 doses as needed for chest pain., Disp: 25 tablet, Rfl: 2   Omega-3 Fatty Acids (FISH OIL PO), Take 1 capsule by mouth daily., Disp: , Rfl:    omeprazole (PRILOSEC OTC) 20 MG tablet, Take 1 tablet (20 mg total) by mouth daily., Disp: 30 tablet, Rfl: 3   OVER THE COUNTER MEDICATION, Take 4 tablets by mouth daily. Total Beets., Disp: , Rfl:    OVER THE COUNTER MEDICATION, Take 3 capsules by mouth daily. Balance of [employer], Disp: , Rfl:    OVER THE COUNTER MEDICATION, Take 3 capsules by mouth daily. Balance of [employer], Disp: , Rfl:    tamsulosin (FLOMAX) 0.4 MG CAPS capsule, Take 0.4 mg by mouth daily., Disp: , Rfl:    Radiology:   No results found.  Cardiac Studies:   Coronary angiogram 01/15/2014: Subtotally occluded PDA, mild diffuse disease in the right coronary artery. Moderate disease in the proximal LAD - 40% and first diagonal branch large with 80% to 90% stenosis in mid segment. LVEF 45-50% with inferior severe hypokinesis. No significant mitral regurgitation. Diffuse coronary calcification.  Abdominal aortic duplex 04/06/2017: Normal abdominal aorta. No evidence of aneurysm.  Lexiscan myoview stress test 03/30/2018: 1. Lexiscan stress test was performed. Exercise capacity was not assessed. Stress symptoms included dyspnea. Peak effect blood pressure was 174/64 mmHg. Stress EKG is non diagnostic for ischemia as it is a pharmacologic stress. In addition, stress electrocardiogram showed sinus tachycardia, incomplete RBBB, no stress arrhythmias and normal stress repolarization. 2. The overall quality of the study is fair. Left ventricular cavity  is noted to be normal on the rest and stress studies. Gated SPECT images reveal normal myocardial thickening and wall motion. The left ventricular ejection fraction was calculated 41%, although visually appears normal. Small sized, mild intensity, reversible perfusion defect in inferior apical myocardium likely represents ischemia. In addition, there is basal inferior myocardium defect seen worse on rest images, which likely represents tissue attenuation artifact. Recommend clinical correlation of ejection fraction with echocardiogram. 3. Intermediate risk study.  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 Duplex suggests stenosis in the left internal carotid artery (16-49%). Right vertebral artery flow is not visualized. Antegrade left vertebral artery flow. Follow up in six months is appropriate if clinically indicated.  Echocardiogram 11/20/2021:  1. Left ventricular ejection fraction, by estimation, is 50 to 55%. The left ventricle has low normal function. The left ventricle has no regional wall motion abnormalities. There is mild left ventricular hypertrophy. Left ventricular diastolic  parameters were normal. The E/e' is 13.8.  2. Right ventricular systolic function is normal. The right ventricular size is normal.  3. The mitral valve is grossly normal. No evidence of mitral valve regurgitation. No evidence of mitral stenosis.  4. The aortic valve is tricuspid. Aortic valve regurgitation is not visualized. Aortic valve sclerosis is present, with no evidence of aortic valve stenosis.   Comparison(s): Prior study 04/13/2018: LVEF 60-65%, Grade I diastolic dysfunction, elevated LAP, mild MR / TR.    Heart Catheterization 11/19/2021:   Mid LM lesion is 20% stenosed.   Prox LAD lesion is 40% stenosed.  Lat 1st Diag lesion is 80% stenosed.   1st Diag lesion is 90% stenosed.   RPDA lesion is 80% stenosed.   LV end diastolic  pressure is normal.   The left ventricular ejection fraction is 50-55% by visual estimate.   LM: Short vessel with 20% disease LAD: Diffuse 40% disease in prox-mid LAD with moderate calcification         Diag 1 bifurcates into a superior and an inferior branch                     Superior branch is medium caliber with 80% stenosis at bifurcation into two further tertiary branches. (Although severe, this was also reported in 2015, therefore likely a chronic stable lesion)                     Inferior branch is small caliber with 90% stenosis before bifurcation into further tertiary branches (Not present in 2015, therefore possibly culprit for today's event) Lcx: Medium caliber vessel with no significant disease RCA: Dominant, tortuous vessel           Small to medium caliber PDA with ostial-prox 80% stenosis (Although severe, this was also reported in 2015, therefore likely a chronic stable lesion)   Normal LVEDP, LVEF       Moderate to severe multivessel disease, primarily in secondary and tertiary branches Unchanged severe lesions in diag and RPDA, likely stable since 2015 Possibly culprit small inferior branch stenosis in diag is too small to intervene Trop mildly elevated and relatively flat, with hypertension being a possible insult   Recommend medical management at this time with up titration of anti anginal therapy, smoking cessation, risk factor modification.  Coronary angioplasty 12/23/2021: Please review coronary angiographic data from 11/19/2021.   Successful PTCA and balloon angioplasty of the RI equivalent mid to distal segment vessel with a 2.0 x 20 mm sapphire at low atmospheric pressure, 100% stenosis reduced to <20% with TIMI 0 to TIMI-3 flow postprocedure.  Large area of distribution of the vessel with multiple secondary branches.   Successful PTCA and stenting of the mid segment of the D1 at bifurcation, large vessel.  Stenting with 2.25 x 12 mm Onyx 28 DES, stenosis  reduced from 90% to 0% with TIMI-3 to TIMI-3 flow.   Recommendation: Patient will be discharged home today, continue aspirin indefinitely and Plavix for repeated of 1 year in view of ACS.  80 mL contrast utilized.  Sedation time 34 minutes.    EKG  EKG 12/15/2021: Normal sinus rhythm rate of 64 bpm, left axis with left anterior fascicular block, incomplete right bundle branch block.  Nonspecific T abnormality in the lateral leads.  Compared to 03/17/2021, no significant change.  Assessment     ICD-10-CM   1. Coronary artery disease of native artery of native heart with stable angina pectoris (HCC)  I25.118     2. Primary hypertension  I10       No orders of the defined types were placed in this encounter.   There are no discontinued medications.   Recommendations:   DRAYVEN MARCHENA  is a 76 y.o. male  with HTN, Hyperlipidemia, admitted on 01/14/2014 with non-ST elevation myocardial infarction with subtotally occluded but diffusely diseased PDA branch of the right coronary artery which was left alone, moderate diffuse RCA disease and high grade D1 disease.  Past medical history significant for hypercholesterolemia, hypertension and multiple medication allergies and intolerances.  He also smokes cigars.  Patient presented to the emergency room on 11/19/2021 and discharged the following day with positive cardiac markers suggestive of NSTEMI.  Cardiac catheterization revealed small vessel disease and hence recommended medical therapy.  He now presents for follow-up.  Patient still continues to have class III angina pectoris in spite of aggressive medical therapy.  I reviewed his angiogram personally, he has a very large diagonal that supplies a very large territory that has a high-grade 95 to 99% stenosis that is probably the culprit and needs angioplasty for the same.  We will schedule him for PCI. Schedule for cardiac catheterization, and possible angioplasty. We discussed regarding risks,  benefits, alternatives to this including stress testing, CTA and continued medical therapy. Patient wants to proceed. Understands <1-2% risk of death, stroke, MI, urgent CABG, bleeding, infection, renal failure but not limited to these.  His wife is present and all questions answered.  I have reviewed his external labs, lipids and excellent control, renal function is normal.  Blood counts have remained stable.  His medications were refilled today.   Nori Riis, NP 01/27/2022, 10:25 AM Office: [phone] Pager: [phone]

## 2022-01-27 NOTE — Progress Notes (Signed)
Primary Physician/Referring:  Greta Doom, MD  Patient ID: Ronald [name], male    DOB: Sep 29, 1945, 76 y.o.   MRN: [id number]  Chief Complaint  Patient presents with   Coronary Artery Disease   Hypertension   Follow-up    6 months   HPI:    Ronald [name]  is a 76 y.o. male  with HTN, Hyperlipidemia, admitted on 01/14/2014 with non-ST elevation myocardial infarction with subtotally occluded but diffusely diseased PDA branch of the right coronary artery which was left alone, moderate diffuse RCA disease and high grade D1 disease.  Past medical history significant for hypercholesterolemia, hypertension and multiple medication allergies and intolerances.  He also smokes cigars.  Patient presented to the emergency room on 11/19/2021 and discharged the following day with positive cardiac markers suggestive of NSTEMI.  Cardiac catheterization revealed small vessel disease and hence recommended medical therapy. He continued to have class III angina pectoris in spite of aggressive medical therapy. Given this, he underwent coronary angioplasty on 12/23/21 with successful PTCA and stenting of the mid segment of the D1 at bifurcation.  He presents today for follow-up s/p coronary angioplasty. Overall, he is doing well and appears much better than last visit. He denies chest pain, shortness of breath, dizziness. He has not needed to take any SL nitro since procedure.  Past Medical History:  Diagnosis Date   Bronchitis    Hyperlipidemia    Hypertension    Myocardial infarction [facility])    Past Surgical History:  Procedure Laterality Date   CORONARY STENT INTERVENTION N/A 12/23/2021   Procedure: CORONARY STENT INTERVENTION;  Surgeon: [name], MD;  Location: [facility] INVASIVE CV LAB;  Service: Cardiovascular;  Laterality: N/A;   LEFT HEART CATH AND CORONARY ANGIOGRAPHY N/A 11/19/2021   Procedure: LEFT HEART CATH AND CORONARY ANGIOGRAPHY;  Surgeon: Elder Negus, MD;  Location: [facility] INVASIVE CV  LAB;  Service: Cardiovascular;  Laterality: N/A;   LEFT HEART CATHETERIZATION WITH CORONARY ANGIOGRAM N/A 01/15/2014   Procedure: LEFT HEART CATHETERIZATION WITH CORONARY ANGIOGRAM;  Surgeon: Micheline Chapman, MD;  Location: [facility] CATH LAB;  Service: Cardiovascular;  Laterality: N/A;   History reviewed. No pertinent family history.  Social History   Tobacco Use   Smoking status: Every Day    Types: Cigars   Smokeless tobacco: Never  Substance Use Topics   Alcohol use: Yes    Comment: beer occ   Marital Status: Married  ROS  Review of Systems  Cardiovascular:  Negative for chest pain, dyspnea on exertion and leg swelling.  Respiratory:  Negative for shortness of breath.   Neurological:  Negative for dizziness.   Objective  Blood pressure 126/65, pulse 68, temperature 98.4 F (36.9 C), temperature source Temporal, resp. rate 16, height 6' (1.829 m), weight 173 lb (78.5 kg), SpO2 96 %.     01/27/2022   11:28 AM 12/23/2021    6:00 PM 12/23/2021    5:45 PM  Vitals with BMI  Height 6\' 0"     Weight 173 lbs    BMI 23.46    Systolic 126 165  Diastolic 65 67 69  Pulse 68 67 71     Physical Exam Constitutional:      Appearance: He is well-developed.  Neck:     Vascular: Carotid bruit (right) present. No JVD.  Cardiovascular:     Rate and Rhythm: Normal rate and regular rhythm.     Pulses: Normal pulses and intact distal pulses.  Heart sounds: No murmur heard.    No gallop.  Pulmonary:     Effort: Pulmonary effort is normal. No accessory muscle usage or respiratory distress.     Breath sounds: Normal breath sounds.  Abdominal:     General: Bowel sounds are normal.     Palpations: Abdomen is soft.     Hernia: A hernia (reducible) is present. Hernia is present in the ventral area.  Musculoskeletal:     Right lower leg: No edema.     Left lower leg: No edema.    Laboratory examination:   Recent Labs    11/19/21 0815 11/20/21 0213 12/23/21 1036  NA 134* 136 136  K  4.1 4.1 4.7  CL 102 105 105  CO2 22 23 24   GLUCOSE 136* 123* 113*  BUN 13 14 12   CREATININE 1.10 1.05 0.95  CALCIUM 9.4 8.8* 8.7*  GFRNONAA >60 >60 >60   CrCl cannot be calculated (Patient's most recent lab result is older than the maximum 21 days allowed.).     Latest Ref Rng & Units 12/23/2021   10:36 AM 11/20/2021    2:13 AM 11/19/2021    8:15 AM  CMP  Glucose 70 - 99 mg/dL 113  123  136   BUN 8 - 23 mg/dL 12  14  13    Creatinine 0.61 - 1.24 mg/dL 0.95  1.05  1.10   Sodium 135 - 145 mmol/L 136  136  134   Potassium 3.5 - 5.1 mmol/L 4.7  4.1  4.1   Chloride 98 - 111 mmol/L 105  105  102   CO2 22 - 32 mmol/L 24  23  22    Calcium 8.9 - 10.3 mg/dL 8.7  8.8  9.4       Latest Ref Rng & Units 12/23/2021   10:36 AM 11/20/2021    2:13 AM 11/19/2021    8:15 AM  CBC  WBC 4.0 - 10.5 K/uL 5.7  7.2  6.7   Hemoglobin 13.0 - 17.0 g/dL 12.3  12.5  13.4   Hematocrit 39.0 - 52.0 % 36.6  36.4  38.0   Platelets 150 - 400 K/uL 186  214  208    Lipid Panel Recent Labs    03/12/21 0815 07/25/21 0820 11/20/21 0213  CHOL 162 116 122  TRIG 88 59 132  LDLCALC 93 42 45  VLDL  --   --  26  HDL 53 61 51  CHOLHDL  --   --  2.4  HEMOGLOBIN A1C Lab Results  Component Value Date   HGBA1C 5.9 (H) 11/19/2021   MPG 122.63 11/19/2021   TSH Recent Labs    11/19/21 1419  TSH 1.281   Medications and allergies   Allergies  Allergen Reactions   Angiotensin Receptor Blockers Diarrhea   Beta Adrenergic Blockers Other (See Comments)    Fatigue   Zestril [Lisinopril] Rash     Current Outpatient Medications:    amLODipine (NORVASC) 5 MG tablet, Take 1 tablet (5 mg total) by mouth daily at 10 pm., Disp: 30 tablet, Rfl: 3   aspirin 81 MG EC tablet, Take 1 tablet (81 mg total) by mouth daily., Disp: 30 tablet, Rfl: 3   atorvastatin (LIPITOR) 80 MG tablet, Take 1 tablet (80 mg total) by mouth at bedtime., Disp: 90 tablet, Rfl: 3   cetirizine (ZYRTEC) 10 MG tablet, Take 10 mg by mouth daily as  needed for allergies., Disp: , Rfl:    cloNIDine (CATAPRES) 0.2 MG  tablet, Take 1 tablet (0.2 mg total) by mouth 2 (two) times daily., Disp: 120 tablet, Rfl: 1   clopidogrel (PLAVIX) 75 MG tablet, TAKE 1 TABLET BY MOUTH DAILY WITH BREAKFAST, Disp: 30 tablet, Rfl: 1   diphenhydramine-acetaminophen (TYLENOL PM) 25-500 MG TABS tablet, Take 1 tablet by mouth at bedtime., Disp: , Rfl:    ezetimibe (ZETIA) 10 MG tablet, Take 1 tablet (10 mg total) by mouth daily., Disp: 100 tablet, Rfl: 3   isosorbide mononitrate (IMDUR) 30 MG 24 hr tablet, TAKE ONE TABLET BY MOUTH EVERY MORNING, Disp: 30 tablet, Rfl: 1   MAGNESIUM PO, Take 1 tablet by mouth at bedtime., Disp: , Rfl:    metoprolol succinate (TOPROL-XL) 25 MG 24 hr tablet, TAKE 1 TABLET BY MOUTH DAILY, Disp: 30 tablet, Rfl: 1   nitroGLYCERIN (NITROSTAT) 0.4 MG SL tablet, Place 1 tablet (0.4 mg total) under the tongue every 5 (five) minutes x 3 doses as needed for chest pain., Disp: 25 tablet, Rfl: 2   Omega-3 Fatty Acids (FISH OIL PO), Take 1 capsule by mouth daily., Disp: , Rfl:    omeprazole (PRILOSEC OTC) 20 MG tablet, Take 1 tablet (20 mg total) by mouth daily., Disp: 30 tablet, Rfl: 3   OVER THE COUNTER MEDICATION, Take 4 tablets by mouth daily. Total Beets., Disp: , Rfl:    OVER THE COUNTER MEDICATION, Take 3 capsules by mouth daily. Balance of [employer], Disp: , Rfl:    OVER THE COUNTER MEDICATION, Take 3 capsules by mouth daily. Balance of [employer], Disp: , Rfl:    tamsulosin (FLOMAX) 0.4 MG CAPS capsule, Take 0.4 mg by mouth daily., Disp: , Rfl:    Radiology:   No results found.  Cardiac Studies:   Abdominal aortic duplex 04/06/2017: Normal abdominal aorta. No evidence of aneurysm.  Lexiscan myoview stress test 03/30/2018: 1. Lexiscan stress test was performed. Exercise capacity was not assessed. Stress symptoms included dyspnea. Peak effect blood pressure was 174/64 mmHg. Stress EKG is non diagnostic for ischemia as it is a  pharmacologic stress. In addition, stress electrocardiogram showed sinus tachycardia, incomplete RBBB, no stress arrhythmias and normal stress repolarization. 2. The overall quality of the study is fair. Left ventricular cavity is noted to be normal on the rest and stress studies. Gated SPECT images reveal normal myocardial thickening and wall motion. The left ventricular ejection fraction was calculated 41%, although visually appears normal. Small sized, mild intensity, reversible perfusion defect in inferior apical myocardium likely represents ischemia. In addition, there is basal inferior myocardium defect seen worse on rest images, which likely represents tissue attenuation artifact. Recommend clinical correlation of ejection fraction with echocardiogram. 3. Intermediate risk study.  Echocardiogram 11/20/2021:  1. Left ventricular ejection fraction, by estimation, is 50 to 55%. The left ventricle has low normal function. The left ventricle has no regional wall motion abnormalities. There is mild left ventricular hypertrophy. Left ventricular diastolic  parameters were normal. The E/e' is 13.8.  2. Right ventricular systolic function is normal. The right ventricular size is normal.  3. The mitral valve is grossly normal. No evidence of mitral valve regurgitation. No evidence of mitral stenosis.  4. The aortic valve is tricuspid. Aortic valve regurgitation is not visualized. Aortic valve sclerosis is present, with no evidence of aortic valve stenosis.   Comparison(s): Prior study 04/13/2018: LVEF 60-65%, Grade I diastolic dysfunction, elevated LAP, mild MR / TR.    Heart Catheterization 11/19/2021:   Mid LM lesion is 20% stenosed.   Prox  LAD lesion is 40% stenosed.   Lat 1st Diag lesion is 80% stenosed.  1st Diag- inf branch lesion is 90% stenosed.   RPDA lesion is 80% stenosed.   LV end diastolic pressure is normal.   The left ventricular ejection fraction is 50-55% by visual estimate.     Normal LVEDP, LVEF      Coronary angioplasty 12/23/2021: Please review coronary angiographic data from 11/19/2021.   Successful PTCA and balloon angioplasty of the RI equivalent mid to distal segment vessel with a 2.0 x 20 mm sapphire at low atmospheric pressure, 100% stenosis reduced to <20% with TIMI 0 to TIMI-3 flow postprocedure.  Large area of distribution of the vessel with multiple secondary branches.   Successful PTCA and stenting of the mid segment of the D1 at bifurcation, large vessel.  Stenting with 2.25 x 12 mm Onyx DES, stenosis reduced from 90% to 0% with TIMI-3 to TIMI-3 flow.  Carotid artery duplex 01/19/2022: Duplex suggests stenosis in the right internal carotid artery (50-69%). Duplex suggests stenosis in the left internal carotid artery (1-15%). Antegrade right vertebral artery flow. Antegrade left vertebral artery flow. Follow up in six months is appropriate if clinically indicated. No significant change from 03/20/2021.  EKG  EKG 01/27/2022: Normal sinus rhythm at the rate of 66 bpm, leftward axis.  Incomplete right bundle branch block.  No evidence of ischemia.  Normal QT interval. Compared to 12/15/2021, lateral T wave inversion not present  Assessment     ICD-10-CM   1. Coronary artery disease of native artery of native heart with stable angina pectoris (HCC)  I25.118 EKG 12-Lead    2. Primary hypertension  I10     3. Mixed hyperlipidemia  E78.2     4. Asymptomatic bilateral carotid artery stenosis  I65.23 PCV CAROTID DUPLEX (BILATERAL)      No orders of the defined types were placed in this encounter.   There are no discontinued medications.   Recommendations:   Ronald [name]  is a 76 y.o. male  with HTN, Hyperlipidemia, admitted on 01/14/2014 with non-ST elevation myocardial infarction with subtotally occluded but diffusely diseased PDA branch of the right coronary artery which was left alone, moderate diffuse RCA disease and high grade D1 disease.   Past medical history significant for hypercholesterolemia, hypertension and multiple medication allergies and intolerances.  He also smokes cigars.  Patient presented to the emergency room on 11/19/2021 and discharged the following day with positive cardiac markers suggestive of NSTEMI.  Cardiac catheterization revealed small vessel disease and hence recommended medical therapy. He continued to have class III angina pectoris in spite of aggressive medical therapy. Given this, he underwent coronary angioplasty on 12/23/21 with successful PTCA and stenting of the mid segment of the D1 at bifurcation.  He presents today for follow-up without complaints of chest pain or shortness of breath. He reports feeling much better since stent placement. He continues on Aspirin and Plavix without bleeding diathesis. Plan to continue DAPT for 1 year then will remain on Aspirin 81mg  daily indefinitely.  Last carotid duplex 01/2021 revealed stable disease. Will obtain carotid duplex prior to next visit.  Follow-up in 6 months or sooner if needed.    02/2021, NP 01/27/2022, 12:05 PM Office: [phone] Pager: [phone]

## 2022-01-30 ENCOUNTER — Ambulatory Visit: Payer: Non-veteran care | Admitting: Cardiology

## 2022-04-01 ENCOUNTER — Telehealth (HOSPITAL_COMMUNITY): Payer: Self-pay

## 2022-04-01 NOTE — Telephone Encounter (Signed)
Called and spoke with pt in regards to CR, pt stated he is not interested at this time.   Closed referral 

## 2022-04-22 ENCOUNTER — Other Ambulatory Visit: Payer: Self-pay | Admitting: Gastroenterology

## 2022-05-06 ENCOUNTER — Encounter: Payer: Self-pay | Admitting: Cardiology

## 2022-05-06 ENCOUNTER — Ambulatory Visit: Payer: Medicare Other | Admitting: Cardiology

## 2022-05-06 VITALS — BP 120/63 | HR 72 | Resp 16 | Ht 72.0 in | Wt 173.0 lb

## 2022-05-06 DIAGNOSIS — I25118 Atherosclerotic heart disease of native coronary artery with other forms of angina pectoris: Secondary | ICD-10-CM | POA: Diagnosis not present

## 2022-05-06 DIAGNOSIS — R0609 Other forms of dyspnea: Secondary | ICD-10-CM | POA: Diagnosis not present

## 2022-05-06 DIAGNOSIS — I10 Essential (primary) hypertension: Secondary | ICD-10-CM | POA: Diagnosis not present

## 2022-05-06 NOTE — Progress Notes (Signed)
Primary Physician/Referring:  Zachery Dauer, MD  Patient ID: Ronald [name], male    DOB: [birth year], 77 y.o.   MRN: [id number]  Chief Complaint  Patient presents with   Coronary Artery Disease   Shortness of Breath   Follow-up   HPI:    Ronald [name]  is a 77 y.o. male  with HTN, Hyperlipidemia, CAD, hypercholesterolemia, hypertension and multiple medication allergies and intolerances.  He also smokes cigars.   He has had NSTEMI in 2015 related to diseased RCA left alone and again on July 2023.  Due to recurrent angina pectoris, underwent repeat angiography and successful angioplasty to D1 branch of LAD on 12/23/2021.  I seen him 3 months ago, patient called and made an appointment as he was having worsening symptoms of dyspnea and dyspnea on exertion. He denies chest pain,  has not needed to take any SL nitro since procedure.  He made an appointment to see me due to worsening symptoms of dyspnea, no leg edema, no PND or orthopnea.  No hemoptysis.  His wife is present.  Past Medical History:  Diagnosis Date   Bronchitis    Hyperlipidemia    Hypertension    Myocardial infarction [facility])    Past Surgical History:  Procedure Laterality Date   CORONARY STENT INTERVENTION N/A 12/23/2021   Procedure: CORONARY STENT INTERVENTION;  Surgeon: Adrian Prows, MD;  Location: [place] [facility];  Service: Cardiovascular;  Laterality: N/A;   LEFT HEART CATH AND CORONARY ANGIOGRAPHY N/A 11/19/2021   Procedure: LEFT HEART CATH AND CORONARY ANGIOGRAPHY;  Surgeon: Nigel Mormon, MD;  Location: [place] [facility];  Service: Cardiovascular;  Laterality: N/A;   LEFT HEART CATHETERIZATION WITH CORONARY ANGIOGRAM N/A 01/15/2014   Procedure: LEFT HEART CATHETERIZATION WITH CORONARY ANGIOGRAM;  Surgeon: Blane Ohara, MD;  Location: [facility] CATH LAB;  Service: Cardiovascular;  Laterality: N/A;   History reviewed. No pertinent family history.  Social History   Tobacco Use   Smoking status: Every Day     Types: Cigars   Smokeless tobacco: Never  Substance Use Topics   Alcohol use: Yes    Comment: beer occ   Marital Status: Married  ROS  Review of Systems  Cardiovascular:  Positive for dyspnea on exertion. Negative for chest pain and leg swelling.  Respiratory:  Negative for shortness of breath.   Neurological:  Negative for dizziness.   Objective  Blood pressure 120/63, pulse 72, resp. rate 16, height 6' (1.829 m), weight 173 lb (78.5 kg), SpO2 96 %.     05/06/2022   11:39 AM 01/27/2022   11:28 AM 12/23/2021    6:00 PM  Vitals with BMI  Height 6\' 0"  6\' 0"    Weight 173 lbs 173 lbs   BMI 67.20 94.70   Systolic 962 836 629  Diastolic 63 65 67  Pulse 72 68 67     Physical Exam Constitutional:      Appearance: He is well-developed.  Neck:     Vascular: Carotid bruit (right) present. No JVD.  Cardiovascular:     Rate and Rhythm: Normal rate and regular rhythm.     Pulses: Normal pulses and intact distal pulses.     Heart sounds: No murmur heard.    No gallop.  Pulmonary:     Effort: Pulmonary effort is normal. No accessory muscle usage or respiratory distress.     Breath sounds: Normal breath sounds.  Abdominal:     General: Bowel sounds are normal.  Palpations: Abdomen is soft.     Hernia: A hernia (reducible) is present. Hernia is present in the ventral area.  Musculoskeletal:     Right lower leg: No edema.     Left lower leg: No edema.    Laboratory examination:   Recent Labs    11/19/21 0815 11/20/21 0213 12/23/21 1036  NA 134* 136 136  K 4.1 4.1 4.7  CL 102 105 105  CO2 22 23 24   GLUCOSE 136* 123* 113*  BUN 13 14 12   CREATININE 1.10 1.05 0.95  CALCIUM 9.4 8.8* 8.7*  GFRNONAA >60 >60 >60   CrCl cannot be calculated (Patient's most recent lab result is older than the maximum 21 days allowed.).     Latest Ref Rng & Units 12/23/2021   10:36 AM 11/20/2021    2:13 AM 11/19/2021    8:15 AM  CMP  Glucose 70 - 99 mg/dL 11/22/2021  11/21/2021  127   BUN 8 - 23 mg/dL  12  14  13    Creatinine 0.61 - 1.24 mg/dL 517  001    Sodium 135 - 145 mmol/L 136  136  134   Potassium 3.5 - 5.1 mmol/L 4.7  4.1  4.1   Chloride 98 - 111 mmol/L 105  105  102   CO2 22 - 32 mmol/L 24  23  22    Calcium 8.9 - 10.3 mg/dL 8.7  8.8  9.4       Latest Ref Rng & Units 12/23/2021   10:36 AM 11/20/2021    2:13 AM 11/19/2021    8:15 AM  CBC  WBC 4.0 - 10.5 K/uL 5.7  7.2  6.7   Hemoglobin 13.0 - 17.0 g/dL  12/25/2021  11/22/2021   Hematocrit 39.0 - 52.0 % 36.6  36.4  38.0   Platelets 150 - 400 K/uL 186  214  208    Lipid Panel Recent Labs    07/25/21 0820 11/20/21 0213  CHOL 116 122  TRIG 59 132  LDLCALC 42 45  VLDL  --  26  HDL 61 51  CHOLHDL  --  2.4  HEMOGLOBIN A1C Lab Results  Component Value Date   HGBA1C 5.9 (H) 11/19/2021   MPG 122.63 11/19/2021   TSH Recent Labs    11/19/21 1419  TSH 1.281   Medications and allergies   Allergies  Allergen Reactions   Angiotensin Receptor Blockers Diarrhea   Beta Adrenergic Blockers Other (See Comments)    Fatigue   Zestril [Lisinopril] Rash     Current Outpatient Medications:    amLODipine (NORVASC) 5 MG tablet, Take 1 tablet (5 mg total) by mouth daily at 10 pm., Disp: 30 tablet, Rfl: 3   aspirin 81 MG EC tablet, Take 1 tablet (81 mg total) by mouth daily., Disp: 30 tablet, Rfl: 3   atorvastatin (LIPITOR) 80 MG tablet, Take 1 tablet (80 mg total) by mouth at bedtime., Disp: 90 tablet, Rfl: 3   cetirizine (ZYRTEC) 10 MG tablet, Take 10 mg by mouth daily as needed for allergies., Disp: , Rfl:    cloNIDine (CATAPRES) 0.2 MG tablet, Take 1 tablet (0.2 mg total) by mouth 2 (two) times daily., Disp: 120 tablet, Rfl: 1   clopidogrel (PLAVIX) 75 MG tablet, TAKE 1 TABLET BY MOUTH DAILY WITH BREAKFAST, Disp: 30 tablet, Rfl: 1   diphenhydramine-acetaminophen (TYLENOL PM) 25-500 MG TABS tablet, Take 1 tablet by mouth at bedtime., Disp: , Rfl:    ezetimibe (ZETIA) 10 MG tablet,  Take 1 tablet (10 mg total) by mouth daily.,  Disp: 100 tablet, Rfl: 3   isosorbide mononitrate (IMDUR) 30 MG 24 hr tablet, TAKE ONE TABLET BY MOUTH EVERY MORNING, Disp: 30 tablet, Rfl: 1   MAGNESIUM PO, Take 1 tablet by mouth at bedtime., Disp: , Rfl:    metoprolol succinate (TOPROL-XL) 25 MG 24 hr tablet, TAKE 1 TABLET BY MOUTH DAILY, Disp: 30 tablet, Rfl: 1   nitroGLYCERIN (NITROSTAT) 0.4 MG SL tablet, Place 1 tablet (0.4 mg total) under the tongue every 5 (five) minutes x 3 doses as needed for chest pain., Disp: 25 tablet, Rfl: 2   Omega-3 Fatty Acids (FISH OIL PO), Take 1 capsule by mouth daily., Disp: , Rfl:    omeprazole (PRILOSEC OTC) 20 MG tablet, Take 1 tablet (20 mg total) by mouth daily., Disp: 30 tablet, Rfl: 3   OVER THE COUNTER MEDICATION, Take 4 tablets by mouth daily. Total Beets., Disp: , Rfl:    OVER THE COUNTER MEDICATION, Take 3 capsules by mouth daily. Balance of [employer], Disp: , Rfl:    OVER THE COUNTER MEDICATION, Take 3 capsules by mouth daily. Balance of [employer], Disp: , Rfl:    tamsulosin (FLOMAX) 0.4 MG CAPS capsule, Take 0.4 mg by mouth daily., Disp: , Rfl:    Radiology:   CT of the abdomen and pelvis with contrast 04/07/2022:  Lung bases show will atelectasis and mild global cardiac enlargement with scattered coronary artery calcification.  Mild subpleural interstitial reticulation.  Hepatic steatosis.  Gallbladder stones.  No biliary dilatation.  Stable left adrenal nodularity.  Benign etiology.  Both kidneys contain nonobstructive calculi.  Simple cyst present at the left kidney.  Extensive atherosclerotic changes of abdominal aorta.  Normal caliber.  Cardiac Studies:   Abdominal aortic duplex 04/06/2017: Normal abdominal aorta. No evidence of aneurysm.  Lexiscan myoview stress test 03/30/2018: 1. Lexiscan stress test was performed. Exercise capacity was not assessed. Stress symptoms included dyspnea. Peak effect blood pressure was 174/64 mmHg. Stress EKG is non diagnostic for  ischemia as it is a pharmacologic stress. In addition, stress electrocardiogram showed sinus tachycardia, incomplete RBBB, no stress arrhythmias and normal stress repolarization. 2. The overall quality of the study is fair. Left ventricular cavity is noted to be normal on the rest and stress studies. Gated SPECT images reveal normal myocardial thickening and wall motion. The left ventricular ejection fraction was calculated 41%, although visually appears normal. Small sized, mild intensity, reversible perfusion defect in inferior apical myocardium likely represents ischemia. In addition, there is basal inferior myocardium defect seen worse on rest images, which likely represents tissue attenuation artifact. Recommend clinical correlation of ejection fraction with echocardiogram. 3. Intermediate risk study.  Echocardiogram 11/20/2021:  1. Left ventricular ejection fraction, by estimation, is 50 to 55%. The left ventricle has low normal function. The left ventricle has no regional wall motion abnormalities. There is mild left ventricular hypertrophy. Left ventricular diastolic  parameters were normal. The E/e' is 13.8.  2. Right ventricular systolic function is normal. The right ventricular size is normal.  3. The mitral valve is grossly normal. No evidence of mitral valve regurgitation. No evidence of mitral stenosis.  4. The aortic valve is tricuspid. Aortic valve regurgitation is not visualized. Aortic valve sclerosis is present, with no evidence of aortic valve stenosis.   Comparison(s): Prior study 04/13/2018: LVEF 54-09%, Grade I diastolic dysfunction, elevated LAP, mild MR / TR.    Heart Catheterization 11/19/2021:   Mid LM lesion is  20% stenosed.   Prox LAD lesion is 40% stenosed.   Lat 1st Diag lesion is 80% stenosed.  1st Diag- inf branch lesion is 90% stenosed.   RPDA lesion is 80% stenosed.   LV end diastolic pressure is normal.   The left ventricular ejection fraction is 50-55% by visual  estimate.    Normal LVEDP, LVEF      Coronary angioplasty 12/23/2021: Please review coronary angiographic data from 11/19/2021.   Successful PTCA and balloon angioplasty of the RI equivalent mid to distal segment vessel with a 2.0 x 20 mm sapphire at low atmospheric pressure, 100% stenosis reduced to <20% with TIMI 0 to TIMI-3 flow postprocedure.  Large area of distribution of the vessel with multiple secondary branches.   Successful PTCA and stenting of the mid segment of the D1 at bifurcation, large vessel.  Stenting with 2.25 x 12 mm Onyx DES, stenosis reduced from 90% to 0% with TIMI-3 to TIMI-3 flow.  Carotid artery duplex 01/19/2022: Duplex suggests stenosis in the right internal carotid artery (50-69%). Duplex suggests stenosis in the left internal carotid artery (1-15%). Antegrade right vertebral artery flow. Antegrade left vertebral artery flow. Follow up in six months is appropriate if clinically indicated. No significant change from 03/20/2021.  EKG  EKG 01/27/2022: Normal sinus rhythm at the rate of 66 bpm, leftward axis.  Incomplete right bundle branch block.  No evidence of ischemia.  Normal QT interval. Compared to 12/15/2021, lateral T wave inversion not present  Assessment     ICD-10-CM   1. Coronary artery disease of native artery of native heart with stable angina pectoris (HCC)  I25.118     2. Dyspnea on exertion  R06.09     3. Primary hypertension  I10       No orders of the defined types were placed in this encounter.   There are no discontinued medications.   Recommendations:   Ronald [name]  is a 77 y.o. male  with HTN, Hyperlipidemia, CAD, hypercholesterolemia, hypertension and multiple medication allergies and intolerances.  He also smokes cigars.   He has had NSTEMI in 2015 related to diseased RCA left alone and again on July 2023.  Due to recurrent angina pectoris, underwent repeat angiography and successful angioplasty to D1 branch of LAD on  12/23/2021.  I seen him 3 months ago, patient called and made an appointment as he was having worsening symptoms of dyspnea and dyspnea on exertion.He denies chest pain, shortness of breath, dizziness. He has not needed to take any SL nitro since procedure.  1. Coronary artery disease of native artery of native heart with stable angina pectoris [facility]) Patient has not had recurrence of angina pectoris since coronary artery stenting.  Continue present medical therapy.  Blood pressure is well-controlled, lipids under excellent control as well.  Only risk factor is continued tobacco use disorder in the form of cigars.  2. Dyspnea on exertion Dyspnea on exertion is of multifactorial etiology, I reviewed the results of the CT scan that was done by the [facility] for the abdomen and pelvis, he has significant scarring in his lung tissue.  I suspect in view of his prior asbestos exposure, agent orange exposure, cigar use, further evaluation of his dyspnea for noncardiac reasons should be performed.  I will forward this copy to his PCP.  He will probably benefit from a CT scan of the chest and/or pulmonary evaluation.  No clinical evidence of heart failure.  3. Primary hypertension Blood pressure is  well-controlled on present medical regimen.  No changes were done with the medications today.  Patient would like to fill a form for disability from the [place] for agent orange exposure, DDT exposure.  I reviewed some of his records, age, smoking, lipids may be contributing to his coronary artery disease along with exposure to the above agents may have contributed as well.  Cause-and-effect is hard to delineate in view of his about risk factors and age.  Follow-up in 3 months or sooner if needed. Will check carotid duplex prior to his next visit     [name], MD, [facility] 05/06/2022, 12:07 PM Office: [phone] Fax: [phone] Pager: [phone]

## 2022-07-03 ENCOUNTER — Other Ambulatory Visit: Payer: Self-pay

## 2022-07-03 DIAGNOSIS — I25118 Atherosclerotic heart disease of native coronary artery with other forms of angina pectoris: Secondary | ICD-10-CM

## 2022-07-03 MED ORDER — ISOSORBIDE MONONITRATE ER 30 MG PO TB24: 30.0000 mg | ORAL_TABLET | Freq: Every morning | ORAL | 1 refills | Status: DC

## 2022-07-03 MED ORDER — ISOSORBIDE MONONITRATE ER 30 MG PO TB24: 30.0000 mg | ORAL_TABLET | Freq: Every morning | ORAL | 1 refills | Status: AC

## 2022-07-17 ENCOUNTER — Encounter (HOSPITAL_COMMUNITY): Payer: Self-pay | Admitting: Gastroenterology

## 2022-07-22 ENCOUNTER — Ambulatory Visit: Payer: Medicare Other

## 2022-07-22 DIAGNOSIS — I6523 Occlusion and stenosis of bilateral carotid arteries: Secondary | ICD-10-CM

## 2022-07-24 ENCOUNTER — Encounter (HOSPITAL_COMMUNITY): Payer: Self-pay | Admitting: Gastroenterology

## 2022-07-24 ENCOUNTER — Ambulatory Visit (HOSPITAL_COMMUNITY)
Admission: RE | Admit: 2022-07-24 | Discharge: 2022-07-24 | Disposition: A | Discharge status: Home / Self Care | Payer: No Typology Code available for payment source | Attending: Gastroenterology | Admitting: Gastroenterology

## 2022-07-24 ENCOUNTER — Encounter (HOSPITAL_COMMUNITY)
Admission: RE | Disposition: A | Discharge status: Home / Self Care | Payer: Self-pay | Source: Home / Self Care | Attending: Gastroenterology

## 2022-07-24 ENCOUNTER — Ambulatory Visit (HOSPITAL_BASED_OUTPATIENT_CLINIC_OR_DEPARTMENT_OTHER): Payer: No Typology Code available for payment source | Admitting: Anesthesiology

## 2022-07-24 ENCOUNTER — Other Ambulatory Visit: Payer: Self-pay

## 2022-07-24 ENCOUNTER — Ambulatory Visit (HOSPITAL_COMMUNITY): Payer: No Typology Code available for payment source | Admitting: Anesthesiology

## 2022-07-24 DIAGNOSIS — C20 Malignant neoplasm of rectum: Secondary | ICD-10-CM

## 2022-07-24 DIAGNOSIS — I25119 Atherosclerotic heart disease of native coronary artery with unspecified angina pectoris: Secondary | ICD-10-CM | POA: Diagnosis not present

## 2022-07-24 DIAGNOSIS — Z7902 Long term (current) use of antithrombotics/antiplatelets: Secondary | ICD-10-CM | POA: Diagnosis not present

## 2022-07-24 DIAGNOSIS — I10 Essential (primary) hypertension: Secondary | ICD-10-CM

## 2022-07-24 DIAGNOSIS — K5730 Diverticulosis of large intestine without perforation or abscess without bleeding: Secondary | ICD-10-CM

## 2022-07-24 DIAGNOSIS — I2510 Atherosclerotic heart disease of native coronary artery without angina pectoris: Secondary | ICD-10-CM | POA: Diagnosis not present

## 2022-07-24 DIAGNOSIS — F17210 Nicotine dependence, cigarettes, uncomplicated: Secondary | ICD-10-CM | POA: Insufficient documentation

## 2022-07-24 DIAGNOSIS — I739 Peripheral vascular disease, unspecified: Secondary | ICD-10-CM | POA: Insufficient documentation

## 2022-07-24 DIAGNOSIS — E785 Hyperlipidemia, unspecified: Secondary | ICD-10-CM | POA: Diagnosis not present

## 2022-07-24 DIAGNOSIS — I252 Old myocardial infarction: Secondary | ICD-10-CM | POA: Insufficient documentation

## 2022-07-24 DIAGNOSIS — Z7982 Long term (current) use of aspirin: Secondary | ICD-10-CM | POA: Diagnosis not present

## 2022-07-24 HISTORY — PX: BIOPSY: SHX5522

## 2022-07-24 HISTORY — PX: COLONOSCOPY WITH PROPOFOL: SHX5780

## 2022-07-24 HISTORY — DX: Malignant neoplasm of rectum: C20

## 2022-07-24 SURGERY — COLONOSCOPY WITH PROPOFOL
Anesthesia: Monitor Anesthesia Care

## 2022-07-24 MED ORDER — LACTATED RINGERS IV SOLN: INTRAVENOUS | Status: DC | PRN

## 2022-07-24 MED ORDER — SODIUM CHLORIDE 0.9 % IV SOLN: INTRAVENOUS | Status: DC

## 2022-07-24 MED ORDER — PROPOFOL 10 MG/ML IV BOLUS
INTRAVENOUS | Status: DC | PRN
  Administered 2022-07-24: 40 mg via INTRAVENOUS

## 2022-07-24 MED ORDER — PROPOFOL 500 MG/50ML IV EMUL
INTRAVENOUS | Status: DC | PRN
  Administered 2022-07-24: 125 ug/kg/min via INTRAVENOUS

## 2022-07-24 MED ORDER — LIDOCAINE 2% (20 MG/ML) 5 ML SYRINGE
INTRAMUSCULAR | Status: DC | PRN
  Administered 2022-07-24: 40 mg via INTRAVENOUS

## 2022-07-24 MED ORDER — LACTATED RINGERS IV SOLN: INTRAVENOUS | Status: DC

## 2022-07-24 SURGICAL SUPPLY — 22 items

## 2022-07-24 NOTE — Transfer of Care (Signed)
Immediate Anesthesia Transfer of Care Note  Patient: Ronald [name]  Procedure(s) Performed: COLONOSCOPY WITH PROPOFOL BIOPSY  Patient Location: PACU and Endoscopy Unit  Anesthesia Type:MAC  Level of Consciousness: awake  Airway & Oxygen Therapy: Patient Spontanous Breathing and Patient connected to face mask oxygen  Post-op Assessment: Report given to RN and Post -op Vital signs reviewed and stable  Post vital signs: Reviewed and stable  Last Vitals:  Vitals Value Taken Time  BP    Temp    Pulse    Resp 14 07/24/22 0851  SpO2    Vitals shown include unvalidated device data.  Last Pain:  Vitals:   07/24/22 0739  TempSrc: Temporal  PainSc: 0-No pain         Complications: No notable events documented.

## 2022-07-24 NOTE — Op Note (Addendum)
[facility] Patient Name: Ronald [name] Procedure Date: 07/24/2022 MRN: [id number] Attending MD: Carol Ada , MD, [id number] Date of Birth: 1946/03/19 CSN: [id number] Age: 77 Admit Type: Outpatient Procedure:                Colonoscopy Indications:              Hematochezia Providers:                Carol Ada, MD, Fanny Skates RN, RN, William Dalton, Technician Referring MD:              Medicines:                 Complications:            No immediate complications. Estimated Blood Loss:     Estimated blood loss was minimal. Procedure:                Pre-Anesthesia Assessment:                           - Prior to the procedure, a History and Physical                            was performed, and patient medications and                            allergies were reviewed. The patient's tolerance of                            previous anesthesia was also reviewed. The risks                            and benefits of the procedure and the sedation                            options and risks were discussed with the patient.                            All questions were answered, and informed consent                            was obtained. Prior Anticoagulants: The patient has                            taken Plavix (clopidogrel), last dose was day of                            procedure. ASA Grade Assessment: III - A patient                            with severe systemic disease. After reviewing the                            risks and  benefits, the patient was deemed in                            satisfactory condition to undergo the procedure.                           - Sedation was administered by an anesthesia                            professional. Deep sedation was attained.                           After obtaining informed consent, the colonoscope                            was passed under direct vision. Throughout the                             procedure, the patient's blood pressure, pulse, and                            oxygen saturations were monitored continuously. The                            [id number]) Olympus colonoscope was                            introduced through the anus and advanced to the the                            cecum, identified by appendiceal orifice and                            ileocecal valve. The colonoscopy was performed                            without difficulty. The patient tolerated the                            procedure well. The quality of the bowel                            preparation was evaluated using the BBPS [facility]                            Bowel Preparation Scale) with scores of: Right                            Colon = 3 (entire mucosa seen well with no residual                            staining, small fragments of stool or opaque  liquid), Transverse Colon = 3 (entire mucosa seen                            well with no residual staining, small fragments of                            stool or opaque liquid) and Left Colon = 3 (entire                            mucosa seen well with no residual staining, small                            fragments of stool or opaque liquid). The total                            BBPS score equals 9. The quality of the bowel                            preparation was good. The ileocecal valve,                            appendiceal orifice, and rectum were photographed. Scope In: 8:25:50 AM Scope Out: 8:44:25 AM Scope Withdrawal Time: 0 hours 13 minutes 7 seconds  Total Procedure Duration: 0 hours 18 minutes 35 seconds  Findings:      A fungating, infiltrative and sessile non-obstructing large mass was       found in the rectum. The mass was partially circumferential (involving       two-thirds of the lumen circumference). The mass measured three cm in       length. No bleeding was  present. This was biopsied with a cold forceps       for histology.      Scattered large-mouthed and medium-mouthed diverticula were found in the       entire colon.      A 5 mm polyp was found in the rectum. The polyp was sessile.      In the rectum a large 75% circumfirential mass was noted in the       posterior aspect of the rectum. The mass extended from the dentate line       up to approximately 3 cm. Retroflexion was not possible. The mass was       friable. Multiple cold biopseis were obtained. A polyp was noted to be       2-3 cm proximal to the mass measuring 5 mm. It was not removed as       treatment for the rectal cancer will removed the polyp. 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                           - Diverticulosis in the entire examined colon. Moderate Sedation:      Not Applicable - Patient had care per Anesthesia. Recommendation:           - Patient has a contact number available for  emergencies. The signs and symptoms of potential                            delayed complications were discussed with the                            patient. Return to normal activities tomorrow.                            Written discharge instructions were provided to the                            patient.                           - Resume previous diet.                           - Continue present medications.                           - Await pathology results.                           - Repeat colonoscopy in 1 year for surveillance.                           - Surgical and Oncology consultations. (A CT scan                            of the ABD/Pelvis was performed in 04/05/2022 - no                            masses or hepatic mets)                           - Check CEA. Procedure Code(s):        --- Professional ---                           [phone], Colonoscopy, flexible; with biopsy, single                             or multiple Diagnosis Code(s):        --- Professional ---                           C20, Malignant neoplasm of rectum                           K92.1, Melena (includes Hematochezia)                           K57.30, Diverticulosis of large intestine without                            perforation or abscess without bleeding  CPT copyright 2022 [employer]. All rights reserved. The codes documented in this report are preliminary and upon coder review may  be revised to meet current compliance requirements. Carol Ada, MD Carol Ada, MD 07/24/2022 8:57:28 AM This report has been signed electronically. Number of Addenda: 0

## 2022-07-24 NOTE — Anesthesia Preprocedure Evaluation (Addendum)
Anesthesia Evaluation  Patient identified by MRN, date of birth, ID band Patient awake    Reviewed: Allergy & Precautions, NPO status , Patient's Chart, lab work & pertinent test results, reviewed documented beta blocker date and time   Airway Mallampati: II  TM Distance: >3 FB Neck ROM: Full    Dental  (+) Teeth Intact, Caps, Dental Advisory Given   Pulmonary Current Smoker and Patient abstained from smoking.   Pulmonary exam normal breath sounds clear to auscultation       Cardiovascular hypertension, Pt. on medications and Pt. on home beta blockers + angina with exertion + CAD, + Past MI and + Peripheral Vascular Disease  Normal cardiovascular exam Rhythm:Regular Rate:Normal  Bilateral carotid artery disease  NSTEMI 7/23  PCI 12/23/21 DES OM1, balloon angioplasty ramus intermedius  EKG 9/23 NSR, left axis deviation, incomplete RBBB pattern  Echo 11/20/21 1. Left ventricular ejection fraction, by estimation, is 50 to 55%. The  left ventricle has low normal function. The left ventricle has no regional  wall motion abnormalities. There is mild left ventricular hypertrophy.  Left ventricular diastolic  parameters were normal. The E/e' is 13.8.   2. Right ventricular systolic function is normal. The right ventricular  size is normal.   3. The mitral valve is grossly normal. No evidence of mitral valve  regurgitation. No evidence of mitral stenosis.   4. The aortic valve is tricuspid. Aortic valve regurgitation is not  visualized. Aortic valve sclerosis is present, with no evidence of aortic  valve stenosis.   Cardiac Cath 12/23/21 Left Main Short left main 20% disease Mid LM lesion is 20% stenosed.  Left Anterior Descending Prox LAD lesion is 40% stenosed. The lesion is moderately calcified.  First Diagonal Branch 1st Diag lesion is 90% stenosed. The lesion is type B2.  Lateral First Diagonal Branch  Ramus  Intermedius Vessel is small. Ramus lesion is 100% stenosed. Vessel is the culprit lesion. The lesion is type C. The lesion was not previously treated .  Right Coronary Artery  Right Posterior Descending Artery RPDA lesion is 80% stenosed.  Intervention  1st Diag lesion Stent Lesion length: 8 mm. CATH VISTA GUIDE 6FR XB3.5 guide catheter was inserted. Lesion crossed with guidewire using a WIRE COUGAR XT STRL 190CM. Pre-stent angioplasty was performed using a BALLN SAPPHIRE 2.0X20. Maximum pressure: 12 atm. Inflation time: 60 sec. A drug-eluting stent was successfully placed using a STENT ONYX FRONTIER 2.25X12. Maximum pressure: 14 atm. Inflation time: 60 sec. Stent strut is well apposed. Post-stent angioplasty was not performed. Post-Intervention Lesion Assessment The intervention was successful. Pre-interventional TIMI flow is 3. Post-intervention TIMI flow is 3. No complications occurred at this lesion. There is a 0% residual stenosis post intervention.  Ramus lesion Angioplasty Lesion length: 15 mm. CATH VISTA GUIDE 6FR XB3.5 guide catheter was inserted. WIRE COUGAR XT STRL 190CM guidewire used to cross lesion. Balloon angioplasty was performed using a BALLN SAPPHIRE 2.0X20. Maximum pressure: 5 atm. Inflation time: 90 sec. Post-Intervention Lesion Assessment The intervention was successful. Pre-interventional TIMI flow is 0. Post-intervention TIMI flow is 3. No complications occurred at this lesion. There is a 20% residual stenosis post intervention.      Neuro/Psych negative neurological ROS  negative psych ROS   GI/Hepatic Neg liver ROS,,,Rectal bleeding   Endo/Other  Hyperlipidemia  Renal/GU negative Renal ROS  negative genitourinary   Musculoskeletal negative musculoskeletal ROS (+)    Abdominal   Peds  Hematology  (+) Blood dyscrasia, anemia Plavix therapy- last  dose 3/21 ASA 81mg  - last dose 3/21    Anesthesia Other Findings   Reproductive/Obstetrics                              Anesthesia Physical Anesthesia Plan  ASA: 3  Anesthesia Plan: MAC   Post-op Pain Management: Minimal or no pain anticipated   Induction: Intravenous  PONV Risk Score and Plan: 0 and Treatment may vary due to age or medical condition and Propofol infusion  Airway Management Planned: Natural Airway and Simple Face Mask  Additional Equipment: None  Intra-op Plan:   Post-operative Plan:   Informed Consent: I have reviewed the patients History and Physical, chart, labs and discussed the procedure including the risks, benefits and alternatives for the proposed anesthesia with the patient or authorized representative who has indicated his/her understanding and acceptance.     Dental advisory given  Plan Discussed with: CRNA and Anesthesiologist  Anesthesia Plan Comments:         Anesthesia Quick Evaluation

## 2022-07-24 NOTE — Anesthesia Postprocedure Evaluation (Signed)
Anesthesia Post Note  Patient: Ronald [name]  Procedure(s) Performed: COLONOSCOPY WITH PROPOFOL BIOPSY     Patient location during evaluation: PACU Anesthesia Type: MAC Level of consciousness: awake and alert and oriented Pain management: pain level controlled Vital Signs Assessment: post-procedure vital signs reviewed and stable Respiratory status: spontaneous breathing, nonlabored ventilation and respiratory function stable Cardiovascular status: stable and blood pressure returned to baseline Postop Assessment: no apparent nausea or vomiting Anesthetic complications: no   No notable events documented.  Last Vitals:  Vitals:   07/24/22 0739  BP: (!) 165/60  Pulse: (!) 51  Resp: 17  Temp: (!) 36.3 C  SpO2: 99%    Last Pain:  Vitals:   07/24/22 0739  TempSrc: Temporal  PainSc: 0-No pain                 Antavia Tandy A.

## 2022-07-24 NOTE — H&P (Signed)
Ronald [name] HPI: The patient complains about some minor hematochezia.  This [facility] and no GI abnormalities were found.  He was positive for atherosclerotic disease.  In July this year he suffered with a minor MI and he was treated with two stents by Dr. Einar Gip.  Since that time he was started on clopidegrel and Ronald [name] believes that this is the reason for his bleeding.  The bleeding only occurs with harder bowel movements.   Past Medical History:  Diagnosis Date   Bronchitis    Hyperlipidemia    Hypertension    Myocardial infarction [facility])     Past Surgical History:  Procedure Laterality Date   CORONARY STENT INTERVENTION N/A 12/23/2021   Procedure: CORONARY STENT INTERVENTION;  Surgeon: Adrian Prows, MD;  Location: [place] [facility];  Service: Cardiovascular;  Laterality: N/A;   LEFT HEART CATH AND CORONARY ANGIOGRAPHY N/A 11/19/2021   Procedure: LEFT HEART CATH AND CORONARY ANGIOGRAPHY;  Surgeon: Nigel Mormon, MD;  Location: [place] [facility];  Service: Cardiovascular;  Laterality: N/A;   LEFT HEART CATHETERIZATION WITH CORONARY ANGIOGRAM N/A 01/15/2014   Procedure: LEFT HEART CATHETERIZATION WITH CORONARY ANGIOGRAM;  Surgeon: Blane Ohara, MD;  Location: [facility] CATH LAB;  Service: Cardiovascular;  Laterality: N/A;    History reviewed. No pertinent family history.  Social History:  reports that he has been smoking cigars. He has never used smokeless tobacco. He reports current alcohol use. He reports that he does not use drugs.  Allergies:  Allergies  Allergen Reactions   Angiotensin Receptor Blockers Diarrhea   Beta Adrenergic Blockers Other (See Comments)    Fatigue   Zestril [Lisinopril] Rash    Medications: Scheduled: Continuous:  sodium chloride     lactated ringers 10 mL/hr at 07/24/22 0743    No results found for this or any previous visit (from the past 24 hour(s)).   PCV CAROTID DUPLEX (BILATERAL)  Result Date: 07/22/2022 Carotid  artery duplex 07/22/2022: Duplex suggests stenosis in the right internal carotid artery (50-69%). Duplex suggests stenosis in the left internal carotid artery (1-15%). <50% stenosis in the left external carotid artery. Antegrade left vertebral artery flow. Compared to the study done on 01/19/2022, no significant change. Follow up in six months is appropriate if clinically indicated.    ROS:  As stated above in the HPI otherwise negative.  Blood pressure (!) 165/60, pulse (!) 51, temperature (!) 97.4 F (36.3 C), temperature source Temporal, resp. rate 17, height 6' (1.829 m), weight 80.3 kg, SpO2 99 %.    PE: Gen: NAD, Alert and Oriented HEENT:  [place]/AT, EOMI Neck: Supple, no LAD Lungs: CTA Bilaterally CV: RRR without M/G/R ABD: Soft, NTND, +BS Ext: No C/C/E  Assessment/Plan: 1) Hematochezia - Colonoscopy.  Ronald [name] 07/24/2022, 8:15 AM

## 2022-07-24 NOTE — Anesthesia Procedure Notes (Signed)
Procedure Name: MAC Date/Time: 07/24/2022 8:18 AM  Performed by: Cynda Familia, CRNAPre-anesthesia Checklist: Patient identified, Emergency Drugs available, Suction available, Patient being monitored and Timeout performed Patient Re-evaluated:Patient Re-evaluated prior to induction Oxygen Delivery Method: Simple face mask Placement Confirmation: positive ETCO2 and breath sounds checked- equal and bilateral Dental Injury: Teeth and Oropharynx as per pre-operative assessment

## 2022-07-24 NOTE — Discharge Instructions (Signed)

## 2022-07-25 LAB — CEA: CEA: 23.9 ng/mL — ABNORMAL HIGH (ref 0.0–4.7)

## 2022-07-29 ENCOUNTER — Encounter: Payer: Self-pay | Admitting: Cardiology

## 2022-07-29 ENCOUNTER — Ambulatory Visit: Payer: Non-veteran care | Admitting: Cardiology

## 2022-07-29 ENCOUNTER — Other Ambulatory Visit: Payer: Self-pay | Admitting: *Deleted

## 2022-07-29 ENCOUNTER — Ambulatory Visit: Payer: Medicare Other | Admitting: Cardiology

## 2022-07-29 VITALS — BP 127/66 | HR 75 | Ht 72.0 in | Wt 170.0 lb

## 2022-07-29 DIAGNOSIS — I10 Essential (primary) hypertension: Secondary | ICD-10-CM

## 2022-07-29 DIAGNOSIS — I25118 Atherosclerotic heart disease of native coronary artery with other forms of angina pectoris: Secondary | ICD-10-CM

## 2022-07-29 DIAGNOSIS — C20 Malignant neoplasm of rectum: Secondary | ICD-10-CM

## 2022-07-29 DIAGNOSIS — I6523 Occlusion and stenosis of bilateral carotid arteries: Secondary | ICD-10-CM

## 2022-07-29 NOTE — Progress Notes (Signed)
Primary Physician/Referring:  Zachery Dauer, MD  Patient ID: VERBON ALSBROOKS, male    DOB: 04-23-1946, 77 y.o.   MRN: [id number]  Chief Complaint  Patient presents with   Coronary artery disease of native artery of native heart wi   Follow-up   HPI:    Ronald [name]  is a 77 y.o. male  with HTN, Hyperlipidemia, CAD, hypercholesterolemia, hypertension and multiple medication allergies and intolerances.  He also smokes cigars.   He has had NSTEMI in 2015 related to diseased RCA left alone and again on July 2023.  Due to recurrent angina pectoris, underwent repeat angiography and successful angioplasty to D1 branch of LAD on 12/23/2021.  He has been diagnosed with rectal cancer and has been referred for oncology and also surgery.  States that he is doing well and has not had any recurrence of angina pectoris.  Dyspnea has remained stable.  No PND, orthopnea or leg edema.  His wife is present.  Past Medical History:  Diagnosis Date   Bronchitis    Hyperlipidemia    Hypertension    Myocardial infarction [facility])    Past Surgical History:  Procedure Laterality Date   BIOPSY  07/24/2022   Procedure: BIOPSY;  Surgeon: Carol Ada, MD;  Location: [facility];  Service: Gastroenterology;;   COLONOSCOPY WITH PROPOFOL N/A 07/24/2022   Procedure: COLONOSCOPY WITH PROPOFOL;  Surgeon: Carol Ada, MD;  Location: [facility];  Service: Gastroenterology;  Laterality: N/A;   CORONARY STENT INTERVENTION N/A 12/23/2021   Procedure: CORONARY STENT INTERVENTION;  Surgeon: Adrian Prows, MD;  Location: [place] [facility];  Service: Cardiovascular;  Laterality: N/A;   LEFT HEART CATH AND CORONARY ANGIOGRAPHY N/A 11/19/2021   Procedure: LEFT HEART CATH AND CORONARY ANGIOGRAPHY;  Surgeon: Nigel Mormon, MD;  Location: [place] [facility];  Service: Cardiovascular;  Laterality: N/A;   LEFT HEART CATHETERIZATION WITH CORONARY ANGIOGRAM N/A 01/15/2014   Procedure: LEFT HEART CATHETERIZATION WITH CORONARY  ANGIOGRAM;  Surgeon: Blane Ohara, MD;  Location: [facility] CATH LAB;  Service: Cardiovascular;  Laterality: N/A;   History reviewed. No pertinent family history.  Social History   Tobacco Use   Smoking status: Every Day    Types: Cigars   Smokeless tobacco: Never  Substance Use Topics   Alcohol use: Yes    Comment: beer occ   Marital Status: Married  ROS  Review of Systems  Cardiovascular:  Positive for dyspnea on exertion. Negative for chest pain and leg swelling.  Neurological:  Negative for dizziness.   Objective  Blood pressure 127/66, pulse 75, height 6' (1.829 m), weight 170 lb (77.1 kg), SpO2 96 %.     07/29/2022    1:05 PM 07/24/2022    9:25 AM 07/24/2022    9:20 AM  Vitals with BMI  Height 6\' 0"     Weight 170 lbs    BMI [id number]    Systolic [id number]  Diastolic 66 69 64  Pulse 75 58 58     Physical Exam Constitutional:      Appearance: He is well-developed.  Neck:     Vascular: Carotid bruit (right) present. No JVD.  Cardiovascular:     Rate and Rhythm: Normal rate and regular rhythm.     Pulses: Normal pulses and intact distal pulses.     Heart sounds: No murmur heard.    No gallop.  Pulmonary:     Effort: Pulmonary effort is normal. No accessory muscle usage or respiratory distress.  Breath sounds: Normal breath sounds.  Abdominal:     General: Bowel sounds are normal.     Palpations: Abdomen is soft.     Hernia: A hernia (reducible) is present. Hernia is present in the ventral area.  Musculoskeletal:     Right lower leg: No edema.     Left lower leg: No edema.    Laboratory examination:   Recent Labs    11/19/21 0815 11/20/21 0213 12/23/21 1036  NA 134* 136 136  K 4.1 4.1 4.7  CL 102 105 105  CO2 22 23 24   GLUCOSE 136* 123* 113*  BUN 13 14 12   CREATININE 1.10 1.05 0.95  CALCIUM 9.4 8.8* 8.7*  GFRNONAA >60 >60 >60      Latest Ref Rng & Units 12/23/2021   10:36 AM 11/20/2021    2:13 AM 11/19/2021    8:15 AM  CMP  Glucose 70 - 99 mg/dL  113  123  136   BUN 8 - 23 mg/dL 12  14  13    Creatinine 0.61 - 1.24 mg/dL 0.95  1.05  1.10   Sodium 135 - 145 mmol/L 136  136  134   Potassium 3.5 - 5.1 mmol/L 4.7  4.1  4.1   Chloride 98 - 111 mmol/L 105  105  102   CO2 22 - 32 mmol/L 24  23  22    Calcium 8.9 - 10.3 mg/dL 8.7  8.8  9.4       Latest Ref Rng & Units 12/23/2021   10:36 AM 11/20/2021    2:13 AM 11/19/2021    8:15 AM  CBC  WBC 4.0 - 10.5 K/uL 5.7  7.2  6.7   Hemoglobin 13.0 - 17.0 g/dL 12.3  12.5  13.4   Hematocrit 39.0 - 52.0 % 36.6  36.4  38.0   Platelets 150 - 400 K/uL 186  214  208    Lipid Panel Recent Labs    11/20/21 0213  CHOL 122  TRIG 132  LDLCALC 45  VLDL 26  HDL 51  CHOLHDL 2.4  HEMOGLOBIN A1C Lab Results  Component Value Date   HGBA1C 5.9 (H) 11/19/2021   MPG 122.63 11/19/2021   TSH Recent Labs    11/19/21 1419  TSH 1.281   Radiology:   CT of the abdomen and pelvis with contrast 04/07/2022:  Lung bases show will atelectasis and mild global cardiac enlargement with scattered coronary artery calcification.  Mild subpleural interstitial reticulation.  Hepatic steatosis.  Gallbladder stones.  No biliary dilatation.  Stable left adrenal nodularity.  Benign etiology.  Both kidneys contain nonobstructive calculi.  Simple cyst present at the left kidney.  Extensive atherosclerotic changes of abdominal aorta.  Normal caliber.  Cardiac Studies:   Abdominal aortic duplex 04/06/2017: Normal abdominal aorta. No evidence of aneurysm.  Lexiscan myoview stress test 03/30/2018: 1. Lexiscan stress test was performed. Exercise capacity was not assessed. Stress symptoms included dyspnea. Peak effect blood pressure was 174/64 mmHg. Stress EKG is non diagnostic for ischemia as it is a pharmacologic stress. In addition, stress electrocardiogram showed sinus tachycardia, incomplete RBBB, no stress arrhythmias and normal stress repolarization. 2. The overall quality of the study is fair. Left ventricular  cavity is noted to be normal on the rest and stress studies. Gated SPECT images reveal normal myocardial thickening and wall motion. The left ventricular ejection fraction was calculated 41%, although visually appears normal. Small sized, mild intensity, reversible perfusion defect in inferior apical myocardium likely represents ischemia. In  addition, there is basal inferior myocardium defect seen worse on rest images, which likely represents tissue attenuation artifact. Recommend clinical correlation of ejection fraction with echocardiogram. 3. Intermediate risk study.  Echocardiogram 11/20/2021:  1. Left ventricular ejection fraction, by estimation, is 50 to 55%. The left ventricle has low normal function. The left ventricle has no regional wall motion abnormalities. There is mild left ventricular hypertrophy. Left ventricular diastolic  parameters were normal. The E/e' is 13.8.  2. Right ventricular systolic function is normal. The right ventricular size is normal.  3. The mitral valve is grossly normal. No evidence of mitral valve regurgitation. No evidence of mitral stenosis.  4. The aortic valve is tricuspid. Aortic valve regurgitation is not visualized. Aortic valve sclerosis is present, with no evidence of aortic valve stenosis.   Comparison(s): Prior study 04/13/2018: LVEF 123456, Grade I diastolic dysfunction, elevated LAP, mild MR / TR.    Heart Catheterization 11/19/2021:   Mid LM lesion is 20% stenosed.   Prox LAD lesion is 40% stenosed.   Lat 1st Diag lesion is 80% stenosed.  1st Diag- inf branch lesion is 90% stenosed.   RPDA lesion is 80% stenosed.   LV end diastolic pressure is normal.   The left ventricular ejection fraction is 50-55% by visual estimate.    Normal LVEDP, LVEF      Coronary angioplasty 12/23/2021: Please review coronary angiographic data from 11/19/2021.   Successful PTCA and balloon angioplasty of the RI equivalent mid to distal segment vessel with a 2.0 x  20 mm sapphire at low atmospheric pressure, 100% stenosis reduced to <20% with TIMI 0 to TIMI-3 flow postprocedure.  Large area of distribution of the vessel with multiple secondary branches.   Successful PTCA and stenting of the mid segment of the D1 at bifurcation, large vessel.  Stenting with 2.25 x 12 mm Onyx DES, stenosis reduced from 90% to 0% with TIMI-3 to TIMI-3 flow.  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  Duplex suggests stenosis in the left internal carotid artery (1-15%). <50% stenosis in the left external carotid artery. Antegrade left vertebral artery flow. Compared to the study done on 01/19/2022, no significant change. Follow up in six months is appropriate if clinically indicated.   EKG  EKG 07/29/2022: Normal sinus rhythm at rate of 76 bpm, leftward axis, incomplete right bundle branch block.  No evidence of ischemia.  Single PVC.  Compared to 01/27/2022, no significant change.  Medications and allergies   Allergies  Allergen Reactions   Angiotensin Receptor Blockers Diarrhea   Beta Adrenergic Blockers Other (See Comments)    Fatigue   Zestril [Lisinopril] Rash    Current Outpatient Medications:    amLODipine (NORVASC) 5 MG tablet, Take 1 tablet (5 mg total) by mouth daily at 10 pm., Disp: 30 tablet, Rfl: 3   aspirin 81 MG EC tablet, Take 1 tablet (81 mg total) by mouth daily., Disp: 30 tablet, Rfl: 3   atorvastatin (LIPITOR) 80 MG tablet, Take 1 tablet (80 mg total) by mouth at bedtime., Disp: 90 tablet, Rfl: 3   cetirizine (ZYRTEC) 10 MG tablet, Take 10 mg by mouth daily as needed for allergies., Disp: , Rfl:    cloNIDine (CATAPRES) 0.2 MG tablet, Take 1 tablet (0.2 mg total) by mouth 2 (two) times daily., Disp: 120 tablet, Rfl: 1   diphenhydramine-acetaminophen (TYLENOL PM) 25-500 MG TABS tablet, Take 2 tablets by mouth at bedtime., Disp: , Rfl:  ezetimibe (ZETIA) 10 MG tablet, Take 1 tablet (10 mg  total) by mouth daily., Disp: 100 tablet, Rfl: 3   isosorbide mononitrate (IMDUR) 30 MG 24 hr tablet, Take 1 tablet (30 mg total) by mouth every morning., Disp: 90 tablet, Rfl: 1   MAGNESIUM PO, Take 1 tablet by mouth at bedtime., Disp: , Rfl:    metoprolol succinate (TOPROL-XL) 25 MG 24 hr tablet, TAKE 1 TABLET BY MOUTH DAILY (Patient taking differently: Take 25 mg by mouth daily with lunch.), Disp: 30 tablet, Rfl: 1   nitroGLYCERIN (NITROSTAT) 0.4 MG SL tablet, Place 1 tablet (0.4 mg total) under the tongue every 5 (five) minutes x 3 doses as needed for chest pain., Disp: 25 tablet, Rfl: 2   Omega-3 Fatty Acids (FISH OIL PO), Take 1 capsule by mouth daily., Disp: , Rfl:    omeprazole (PRILOSEC OTC) 20 MG tablet, Take 1 tablet (20 mg total) by mouth daily., Disp: 30 tablet, Rfl: 3   OVER THE COUNTER MEDICATION, Take 5 tablets by mouth daily with breakfast. Immuno 150 supplement, Disp: , Rfl:    polyethylene glycol (MIRALAX / GLYCOLAX) 17 g packet, Take 17 g by mouth 2 (two) times daily., Disp: , Rfl:    Probiotic Product (RESTORA) CAPS, Take 1 capsule by mouth daily., Disp: , Rfl:    tamsulosin (FLOMAX) 0.4 MG CAPS capsule, Take 0.4 mg by mouth 2 (two) times daily., Disp: , Rfl:    TURMERIC PO, Take 2 tablets by mouth daily., Disp: , Rfl:     Assessment     ICD-10-CM   1. Coronary artery disease of native artery of native heart with stable angina pectoris ([place])  I25.118 EKG 12-Lead    2. Asymptomatic bilateral carotid artery stenosis  I65.23     3. Primary hypertension  I10       No orders of the defined types were placed in this encounter.   Medications Discontinued During This Encounter  Medication Reason   clopidogrel (PLAVIX) 75 MG tablet Completed Course     Recommendations:   Ronald [name]  is a 77 y.o. male  with HTN, Hyperlipidemia, CAD, hypercholesterolemia, hypertension and multiple medication allergies and intolerances.  He also smokes cigars.   He has had NSTEMI in  2015 related to diseased RCA left alone and again on July 2023.  Due to recurrent angina pectoris, underwent repeat angiography and successful angioplasty to D1 branch of LAD on 12/23/2021.  Colonoscopy 07/24/2022 revealed a malignant tumor in the rectum and diverticulosis of the entire examined colon, patient is now referred to oncology for further evaluation.  This is 39-month office visit.  1. Coronary artery disease of native artery of native heart with stable angina pectoris [facility]) Patient has not had any recurrence of angina pectoris.  Since it has been >6 months since last angioplasty, he can now discontinue Plavix.  He is also being referred for surgery and also oncology for new diagnosis of rectal cancer.  He appears to be positive about his approach for cancer.  His wife is present. - EKG 12-Lead  2. Asymptomatic bilateral carotid artery stenosis No significant change in carotid artery stenosis.   Will recheck  Duplex in 6 months.  Lipids under excellent control.   3. Primary hypertension Blood pressure under excellent control as well.  Continue present management.  Reviewed his external labs.  No changes in the medication except for discontinuing Plavix for now.  I will see him back in 6 months or sooner  if problems.   Adrian Prows, MD, [facility] 07/29/2022, 1:25 PM Office: [phone] Fax: [phone] Pager: [phone]

## 2022-07-30 ENCOUNTER — Encounter: Payer: Self-pay | Admitting: *Deleted

## 2022-07-30 NOTE — Progress Notes (Signed)
PATIENT NAVIGATOR PROGRESS NOTE  Name: Ronald [name] Date: 07/30/2022 MRN: [id number]  DOB: 10/29/1945   Reason for visit:  New patient appt  Comments:  Spoke with wife Arnoldo Salzman and have scheduled [name] with Dr Benay Spice for 08/05/22 at 1:10 pm     Time spent counseling/coordinating care: 30-45 minutes

## 2022-07-31 LAB — SURGICAL PATHOLOGY

## 2022-08-05 ENCOUNTER — Encounter: Payer: Self-pay | Admitting: *Deleted

## 2022-08-05 ENCOUNTER — Inpatient Hospital Stay
Admission: RE | Admit: 2022-08-05 | Discharge: 2022-08-05 | Disposition: A | Discharge status: Home / Self Care | Payer: Self-pay | Source: Ambulatory Visit | Attending: Oncology | Admitting: Oncology

## 2022-08-05 ENCOUNTER — Other Ambulatory Visit: Payer: Self-pay | Admitting: *Deleted

## 2022-08-05 ENCOUNTER — Inpatient Hospital Stay: Payer: No Typology Code available for payment source | Attending: Oncology | Admitting: Oncology

## 2022-08-05 VITALS — BP 106/62 | HR 74 | Temp 98.1°F | Resp 18 | Ht 72.0 in | Wt 167.0 lb

## 2022-08-05 DIAGNOSIS — I10 Essential (primary) hypertension: Secondary | ICD-10-CM | POA: Insufficient documentation

## 2022-08-05 DIAGNOSIS — C20 Malignant neoplasm of rectum: Secondary | ICD-10-CM

## 2022-08-05 DIAGNOSIS — E785 Hyperlipidemia, unspecified: Secondary | ICD-10-CM | POA: Insufficient documentation

## 2022-08-05 DIAGNOSIS — F17290 Nicotine dependence, other tobacco product, uncomplicated: Secondary | ICD-10-CM | POA: Insufficient documentation

## 2022-08-05 DIAGNOSIS — Z5111 Encounter for antineoplastic chemotherapy: Secondary | ICD-10-CM | POA: Insufficient documentation

## 2022-08-05 DIAGNOSIS — I2510 Atherosclerotic heart disease of native coronary artery without angina pectoris: Secondary | ICD-10-CM | POA: Insufficient documentation

## 2022-08-05 DIAGNOSIS — I252 Old myocardial infarction: Secondary | ICD-10-CM | POA: Insufficient documentation

## 2022-08-05 DIAGNOSIS — R918 Other nonspecific abnormal finding of lung field: Secondary | ICD-10-CM | POA: Insufficient documentation

## 2022-08-05 NOTE — Progress Notes (Signed)
PATIENT NAVIGATOR PROGRESS NOTE  Name: Ronald [name] Date: 08/05/2022 MRN: [id number]  DOB: 1945-05-25   Reason for visit:  New Patient appt  Comments:  Met with Mr and [name] during appt with Dr Benay Spice Pelvic MRI ordered for staging and scheduled for 08/06/22 at 8pm. Provided written instructions for scan and prep Referral made to Radiation oncology Referral made to Social Work [place] education provided as well as [facility] with rectal cancer specific information Will be presented at GI conference on 4/10 Given contact information to call with any questions    Time spent counseling/coordinating care: > 60 minutes

## 2022-08-05 NOTE — Progress Notes (Signed)
[facility] New Patient Consult   Requesting MD: Carol Ada, [address], [place],  [place] [address]   Ronald [name] 77 y.o.  1946-04-19    Reason for Consult: Rectal cancer   HPI: Ronald [name] reports developing rectal bleeding in late December 2023.  He was referred to Dr. Benson [place] and was taken to a colonoscopy on 07/24/2022.  A fungating infiltrative and sessile nonobstructing mass was found in the rectum.  The mass is partially circumferential and measured 3 cm in length.  The mass was biopsied.  There was a 5 mm polyp in the rectum.  The mass involved the posterior aspect of the rectum and extended from the dentate line up to 3 cm.  The polyp was 2 to 3 cm proximal to the mass.  The polyp was not removed. The pathology from the rectal mass biopsy returned as invasive moderately differentiated adenocarcinoma.  Mismatch repair protein suppression is intact.  He reports feeling well.  Past Medical History:  Diagnosis Date   Bronchitis    Hyperlipidemia    Hypertension    Myocardial infarction [facility]), NSTEMI 2015 and July 2023     .  CAD-angioplasty to D1 branch of LAD 12/23/2021   .  PTSD  Past Surgical History:  Procedure Laterality Date   BIOPSY  07/24/2022   Procedure: BIOPSY;  Surgeon: Carol Ada, MD;  Location: [facility];  Service: Gastroenterology;;   COLONOSCOPY WITH PROPOFOL N/A 07/24/2022   Procedure: COLONOSCOPY WITH PROPOFOL;  Surgeon: Carol Ada, MD;  [facility];  Service: Gastroenterology;  Laterality: N/A;   CORONARY STENT INTERVENTION N/A 12/23/2021   Procedure: CORONARY STENT INTERVENTION;  Surgeon: Adrian Prows, MD;  Location: [facility] CV LAB;  Service: Cardiovascular;  Laterality: N/A;   LEFT HEART CATH AND CORONARY ANGIOGRAPHY N/A 11/19/2021   Procedure: LEFT HEART CATH AND CORONARY ANGIOGRAPHY;  Surgeon: Nigel Mormon, MD;  Location: [facility] CV LAB;  Service: Cardiovascular;  Laterality: N/A;    LEFT HEART CATHETERIZATION WITH CORONARY ANGIOGRAM N/A 01/15/2014   Procedure: LEFT HEART CATHETERIZATION WITH CORONARY ANGIOGRAM;  Surgeon: Blane Ohara, MD;  Location: [facility] CATH LAB;  Service: Cardiovascular;  Laterality: N/A;    Medications: Reviewed  Allergies:  Allergies  Allergen Reactions   Angiotensin Receptor Blockers Diarrhea   Beta Adrenergic Blockers Other (See Comments)    Fatigue   Zestril [Lisinopril] Rash    Family history: No family history of cancer  Social History:   He lives with his wife in [place].  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  He drinks 2-3 beers per day.  He smokes cigars-up to 5 or 6 daily, he was in the [employer] for 2 years including time in [place].  No transfusion history.  No risk factor for HIV or hepatitis.  ROS:   Positives include: Rectal bleeding, nocturia, mild "hip "discomfort  A complete ROS was otherwise negative.  Physical Exam:  Blood pressure 106/62, pulse 74, temperature 98.1 F (36.7 C), temperature source Oral, resp. rate 18, height 6' (1.829 m), weight 167 lb (75.8 kg), SpO2 100 %.  HEENT: Oral cavity without visible mass, the pharynx was poorly visualized.  Neck without mass. Lungs: Clear bilaterally Cardiac: Regular rate and rhythm Abdomen: No hepatosplenomegaly, no mass, nontender GU: Sees without mass Vascular: No leg edema Lymph nodes: No cervical, supraclavicular, axillary, or inguinal nodes Neurologic: Alert and oriented, the motor exam appears intact in the  upper and lower extremities bilaterally Skin: No rash Musculoskeletal: No spine tenderness, no pain with motion of the left hip, no tenderness at the left trochanter Rectal: Irregular firm mass centered at the posterior rectum and palpated circumferentially.  The mass appears to spare part of the anterior rectum.  The mass begins within a few centimeters of the anal verge.  I could not reach the proximal extent of  the mass with examination finger.  LAB:  CBC  Lab Results  Component Value Date   WBC 5.7 12/23/2021   HGB 12.3 (L) 12/23/2021   HCT 36.6 (L) 12/23/2021   MCV 94.8 12/23/2021   PLT 186 12/23/2021   NEUTROABS 6.5 01/14/2014        CMP  Lab Results  Component Value Date   NA 136 12/23/2021   K 4.7 12/23/2021   CL 105 12/23/2021   CO2 24 12/23/2021   GLUCOSE 113 (H) 12/23/2021   BUN 12 12/23/2021   CREATININE 0.95 12/23/2021   CALCIUM 8.7 (L) 12/23/2021   PROT 6.3 03/12/2021   ALBUMIN 4.5 03/12/2021   AST 21 03/12/2021   ALT 20 03/12/2021   ALKPHOS 60 03/12/2021   BILITOT 0.4 03/12/2021   GFRNONAA >60 12/23/2021   GFRAA 84 02/10/2019     Lab Results  Component Value Date   CEA1 23.9 (H) 07/24/2022      Assessment/Plan:   Rectal cancer Colonoscopy 07/24/2022-mass in the posterior rectum extending from the dentate line for 3 cm, biopsy-moderately differentiated invasive adenocarcinoma, mismatch repair protein expression intact CT abdomen/pelvis 04/07/2022-cardiac enlargement, hepatic steatosis mild left adrenal nodularity stable compared to chest CT March 2020, small nonobstructing bilateral renal calculi, extensive colonic diverticula CT chest 06/03/2022-6 mm nodule in the superior right lower lobe-new from 07/12/2018, new 1-2 mm nodule within the left upper lobe Elevated CEA 07/24/2022 CAD, status post myocardial infarction 2015 and July 2023, D1 branch LAD angioplasty 12/23/2021 Hyperlipidemia Hypertension PTSD BPH 7.   Cigar use   Disposition:   Ronald [name] diagnosed with rectal cancer.  He has a distal rectal tumor.  There is no clinical or definitive CT evidence of metastatic disease, but there were indeterminate lung nodules on a CT at the [place] in January.  I discussed the treatment of rectal cancer with Ronald [name] and his wife.  He will complete a staging MRI of the pelvis.  His case will be presented at the GI tumor conference.  He will be referred to  surgery and radiation oncology.  We discussed the majority of patients with locally advanced rectal cancer are treated with neoadjuvant chemotherapy and radiation followed by surgery.  The treatment recommendation will depend on the staging MRI and further review of the chest CT.  We explained the need for Port-A-Cath placement if he receives chemotherapy.  He will return for an office visit and further discussion on 08/12/2022.  Betsy Coder, MD  08/05/2022, 3:37 PM

## 2022-08-06 ENCOUNTER — Encounter: Payer: Self-pay | Admitting: *Deleted

## 2022-08-06 ENCOUNTER — Ambulatory Visit (HOSPITAL_COMMUNITY)
Admission: RE | Admit: 2022-08-06 | Discharge: 2022-08-06 | Disposition: A | Discharge status: Home / Self Care | Payer: Medicare Other | Source: Ambulatory Visit | Attending: Oncology | Admitting: Oncology

## 2022-08-06 ENCOUNTER — Other Ambulatory Visit: Payer: Self-pay | Admitting: Oncology

## 2022-08-06 ENCOUNTER — Inpatient Hospital Stay: Payer: No Typology Code available for payment source

## 2022-08-06 DIAGNOSIS — C20 Malignant neoplasm of rectum: Secondary | ICD-10-CM

## 2022-08-06 NOTE — Progress Notes (Signed)
Ronald [name] will go to [place] today to obtain CD of images and copies of radiology reports and will take them to [facility] radiology department tonight when he goes to MRI appt for uploading into EPIC  Also he will come by [facility] to get copy of RFAS form and records completed and already emailed to [facility] for Radiation oncology referral authorization.

## 2022-08-06 NOTE — Progress Notes (Signed)
PATIENT NAVIGATOR PROGRESS NOTE  Name: Ronald [name] Date: 08/06/2022 MRN: [id number]  DOB: October 10, 1945   Reason for visit:  Follow up   Comments:  Attempting to get images from [place] for CT scan on 04/07/22 and CT scan on 06/03/22, [facility], request for CD made. Informed by [facility] that pt will need to pick up as he will need to sign release of information form  RFAS form with supporting documents including Dr Benay Spice consult note, colonoscopy report and pathology report emailed to [email or web address] today for Radiation Oncology services  Sent and marked URGENT referral    Time spent counseling/coordinating care: > 60 minutes

## 2022-08-06 NOTE — Progress Notes (Signed)
[facility] Work  Initial Assessment   MCALLISTER DALESIO is a 77 y.o. year old male contacted by phone. Clinical Social Work was referred by nurse navigator for assessment of psychosocial needs.   SDOH (Social Determinants of Health) assessments performed: Yes SDOH Interventions    Flowsheet Row Clinical Support from 08/06/2022 in [place] at [facility] Office Visit from 08/05/2022 in [place] at [place] Interventions -- Intervention Not Indicated  Housing Interventions -- Intervention Not Indicated  Transportation Interventions -- Intervention Not Indicated  Utilities Interventions -- Intervention Not Indicated  Financial Strain Interventions Intervention Not Indicated --  Stress Interventions Intervention Not Indicated --       SDOH Screenings   Food Insecurity: No Food Insecurity (08/05/2022)  Housing: Low Risk  (08/05/2022)  Transportation Needs: No Transportation Needs (08/05/2022)  Utilities: Not At Risk (08/05/2022)  Depression (PHQ2-9): Low Risk  (08/05/2022)  Financial Resource Strain: Low Risk  (08/06/2022)  Stress: No Stress Concern Present (08/06/2022)  Tobacco Use: High Risk (07/29/2022)     Distress Screen completed: No     No data to display            Family/Social Information:  Housing Arrangement: patient lives with his wife, Lattie Haw. Family members/support persons in your life? Family and Geographical information systems officer concerns: no  Employment: Retired Clinical biochemist.  Income source: [facility] Benefit Financial concerns: No Type of concern: None Food access concerns: no Religious or spiritual practice: No Services Currently in place:  [place] and Medicare.  Coping/ Adjustment to diagnosis: Patient understands treatment plan and what happens next? yes Concerns about diagnosis and/or treatment:  Patient expressed feeling fortunate that he has had minimal health issues in his  lifetime. Patient reported stressors:  Working with the [facility] to make sure he could have the MD of his choosing. Hopes and/or priorities: To have the cancer localized and not metastasized. Patient enjoys time with family/ friends Current coping skills/ strengths: Capable of independent living , Armed forces logistics/support/administrative officer , Scientist, research (life sciences) , General fund of knowledge , Motivation for treatment/growth , and Supportive family/friends     SUMMARY: Current SDOH Barriers:  None  Clinical Social Work Clinical Goal(s):  No clinical social work goals at this time  Interventions: Discussed common feeling and emotions when being diagnosed with cancer, and the importance of support during treatment Informed patient of the support team roles and support services at [facility] Provided [place] contact information and encouraged patient to call with any questions or concerns Provided patient with information about the [employer]  He accepted CSW mailing him program and contact information.   Follow Up Plan: Patient will contact CSW with any support or resource needs Patient verbalizes understanding of plan: Yes    Rodman Pickle Bertil Brickey, LCSW

## 2022-08-10 ENCOUNTER — Inpatient Hospital Stay
Admission: RE | Admit: 2022-08-10 | Discharge: 2022-08-10 | Disposition: A | Discharge status: Home / Self Care | Payer: Self-pay | Source: Ambulatory Visit | Attending: Oncology | Admitting: Oncology

## 2022-08-10 ENCOUNTER — Other Ambulatory Visit (HOSPITAL_COMMUNITY): Payer: Self-pay | Admitting: Oncology

## 2022-08-10 ENCOUNTER — Encounter (HOSPITAL_COMMUNITY): Payer: Self-pay | Admitting: Oncology

## 2022-08-10 DIAGNOSIS — C20 Malignant neoplasm of rectum: Secondary | ICD-10-CM

## 2022-08-12 ENCOUNTER — Inpatient Hospital Stay (HOSPITAL_BASED_OUTPATIENT_CLINIC_OR_DEPARTMENT_OTHER): Payer: No Typology Code available for payment source | Admitting: Oncology

## 2022-08-12 ENCOUNTER — Encounter: Payer: Self-pay | Admitting: Oncology

## 2022-08-12 VITALS — BP 120/59 | HR 80 | Temp 98.1°F | Resp 18 | Ht 72.0 in | Wt 169.0 lb

## 2022-08-12 DIAGNOSIS — I2510 Atherosclerotic heart disease of native coronary artery without angina pectoris: Secondary | ICD-10-CM | POA: Diagnosis not present

## 2022-08-12 DIAGNOSIS — I10 Essential (primary) hypertension: Secondary | ICD-10-CM | POA: Diagnosis not present

## 2022-08-12 DIAGNOSIS — Z5111 Encounter for antineoplastic chemotherapy: Secondary | ICD-10-CM | POA: Diagnosis present

## 2022-08-12 DIAGNOSIS — R918 Other nonspecific abnormal finding of lung field: Secondary | ICD-10-CM | POA: Diagnosis not present

## 2022-08-12 DIAGNOSIS — I252 Old myocardial infarction: Secondary | ICD-10-CM | POA: Diagnosis not present

## 2022-08-12 DIAGNOSIS — C20 Malignant neoplasm of rectum: Secondary | ICD-10-CM | POA: Diagnosis not present

## 2022-08-12 DIAGNOSIS — F17290 Nicotine dependence, other tobacco product, uncomplicated: Secondary | ICD-10-CM | POA: Diagnosis not present

## 2022-08-12 DIAGNOSIS — E785 Hyperlipidemia, unspecified: Secondary | ICD-10-CM | POA: Diagnosis not present

## 2022-08-12 NOTE — Progress Notes (Signed)
Pharmacist Chemotherapy Monitoring - Initial Assessment    Anticipated start date: 08/24/22   The following has been reviewed per standard work regarding the patient's treatment regimen: The patient's diagnosis, treatment plan and drug doses, and organ/hematologic function Lab orders and baseline tests specific to treatment regimen  The treatment plan start date, drug sequencing, and pre-medications Prior authorization status  Patient's documented medication list, including drug-drug interaction screen and prescriptions for anti-emetics and supportive care specific to the treatment regimen The drug concentrations, fluid compatibility, administration routes, and timing of the medications to be used The patient's access for treatment and lifetime cumulative dose history, if applicable  The patient's medication allergies and previous infusion related reactions, if applicable   Changes made to treatment plan:  N/A  Follow up needed:  Pending authorization for treatment    Daylene Katayama, [facility], 08/12/2022  3:28 PM

## 2022-08-12 NOTE — Progress Notes (Signed)
[facility] OFFICE PROGRESS NOTE   Diagnosis: Rectal cancer  INTERVAL HISTORY:   [name] is as scheduled.  He feels well.  He has rectal "soreness "and mild bleeding.  No new complaint.  He is scheduled to see Dr. Cliffton Asters next week.  He reports the [place] has approved radiation oncology treatment in [place].  Objective:  Vital signs in last 24 hours:  Blood pressure (!) 120/59, pulse 80, temperature 98.1 F (36.7 C), temperature source Oral, resp. rate 18, height 6' (1.829 m), weight 169 lb (76.7 kg), SpO2 100 %.     Resp: Lungs clear bilaterally Cardio: Regular rate and rhythm GI: No hepatosplenomegaly, nontender Vascular: No leg edema  Lab Results:  Lab Results  Component Value Date   WBC 5.7 12/23/2021   HGB 12.3 (L) 12/23/2021   HCT 36.6 (L) 12/23/2021   MCV 94.8 12/23/2021   PLT 186 12/23/2021   NEUTROABS 6.5 01/14/2014    CMP  Lab Results  Component Value Date   NA 136 12/23/2021   K 4.7 12/23/2021   CL 105 12/23/2021   CO2 24 12/23/2021   GLUCOSE 113 (H) 12/23/2021   BUN 12 12/23/2021   CREATININE 0.95 12/23/2021   CALCIUM 8.7 (L) 12/23/2021   PROT 6.3 03/12/2021   ALBUMIN 4.5 03/12/2021   AST 21 03/12/2021   ALT 20 03/12/2021   ALKPHOS 60 03/12/2021   BILITOT 0.4 03/12/2021   GFRNONAA >60 12/23/2021   GFRAA 84 02/10/2019    Lab Results  Component Value Date   CEA1 23.9 (H) 07/24/2022   wed the patient's current medications.   Assessment/Plan: Rectal cancer Colonoscopy 07/24/2022-mass in the posterior rectum extending from the dentate line for 3 cm, biopsy-moderately differentiated invasive adenocarcinoma, mismatch repair protein expression intact CT abdomen/pelvis 04/07/2022-cardiac enlargement, hepatic steatosis mild left adrenal nodularity stable compared to chest CT March 2020, small nonobstructing bilateral renal calculi, extensive colonic diverticula CT chest 06/03/2022-6 mm nodule in the superior right lower lobe-new from  07/12/2018, new 1-2 mm nodule within the left upper lobe Elevated CEA 07/24/2022 MRI pelvis 08/06/2022-tumor at 3.5 cm from the anal verge with involvement of the upper anal sphincter, invasion of the inferior right levator ani muscle, T4b, one 5 mm left posterior perirectal node-in 1 CAD, status post myocardial infarction 2015 and July 2023, D1 branch LAD angioplasty 12/23/2021 Hyperlipidemia Hypertension PTSD BPH 7.   Cigar use     Disposition: [name] has been diagnosed with clinical stage IIIc rectal cancer.  There were indeterminate lung nodules on the chest CT in January.  I discussed treatment options with [name] and his wife.  My initial recommendation is to proceed with total neoadjuvant therapy.  He has been referred to Drs. [facility] and [place].  His case will be presented at the GI tumor conference next week.  We discussed the need for Port-A-Cath placement.  I recommend FOLFOX chemotherapy.  We reviewed potential toxicities associated with the FOLFOX regimen including the chance of nausea/vomiting, mucositis, diarrhea, alopecia, hematologic toxicity, infection, and bleeding.  We discussed the sun sensitivity, hyperpigmentation, hand/foot syndrome, and rash associated with 5-fluorouracil.  We reviewed allergic reaction and various types of neuropathy seen with oxaliplatin.  He agrees to proceed.  He will attend a chemotherapy teaching class.  He will be referred for a chemotherapy teaching class and Port-A-Cath placement.  He will have a restaging chest CT at the completion of neoadjuvant chemotherapy.  We discussed the need for ileostomy and potentially permanent colostomy  placement.  A chemotherapy plan was entered today.  Thornton Papas, MD  08/12/2022  9:45 AM

## 2022-08-12 NOTE — Progress Notes (Signed)
Patient reports the [place] approved his RT,but he has no documentation. It is being mailed to him.

## 2022-08-12 NOTE — Progress Notes (Signed)
START ON PATHWAY REGIMEN - Colorectal     A cycle is every 14 days:     Oxaliplatin      Leucovorin      Fluorouracil      Fluorouracil   **Always confirm dose/schedule in your pharmacy ordering system**  Patient Characteristics: Preoperative or Nonsurgical Candidate, M0 (Clinical Staging), Rectal, cT0/1, cN+; or cT4, Any cN; or Any cT, cN2 Tumor Location: Rectal Therapeutic Status: Preoperative or Nonsurgical Candidate, M0 (Clinical Staging) AJCC T Category: cT4b AJCC N Category: cN1 AJCC M Category: cM0 AJCC 8 Stage Grouping: IIIC Intent of Therapy: Curative Intent, Discussed with Patient

## 2022-08-14 ENCOUNTER — Other Ambulatory Visit: Payer: Self-pay

## 2022-08-17 ENCOUNTER — Other Ambulatory Visit: Payer: Self-pay

## 2022-08-17 ENCOUNTER — Encounter (HOSPITAL_COMMUNITY)
Admission: RE | Admit: 2022-08-17 | Discharge: 2022-08-17 | Disposition: A | Discharge status: Home / Self Care | Payer: No Typology Code available for payment source | Source: Ambulatory Visit | Attending: Surgery | Admitting: Surgery

## 2022-08-17 ENCOUNTER — Ambulatory Visit: Payer: Self-pay | Admitting: Surgery

## 2022-08-17 ENCOUNTER — Encounter (HOSPITAL_COMMUNITY): Payer: Self-pay

## 2022-08-17 ENCOUNTER — Other Ambulatory Visit: Payer: Self-pay | Admitting: *Deleted

## 2022-08-17 ENCOUNTER — Inpatient Hospital Stay: Payer: No Typology Code available for payment source

## 2022-08-17 DIAGNOSIS — Z01818 Encounter for other preprocedural examination: Secondary | ICD-10-CM

## 2022-08-17 DIAGNOSIS — I252 Old myocardial infarction: Secondary | ICD-10-CM | POA: Insufficient documentation

## 2022-08-17 DIAGNOSIS — C20 Malignant neoplasm of rectum: Secondary | ICD-10-CM | POA: Diagnosis present

## 2022-08-17 DIAGNOSIS — Z955 Presence of coronary angioplasty implant and graft: Secondary | ICD-10-CM | POA: Diagnosis not present

## 2022-08-17 DIAGNOSIS — F17200 Nicotine dependence, unspecified, uncomplicated: Secondary | ICD-10-CM | POA: Diagnosis not present

## 2022-08-17 DIAGNOSIS — I10 Essential (primary) hypertension: Secondary | ICD-10-CM | POA: Insufficient documentation

## 2022-08-17 HISTORY — DX: Personal history of urinary calculi: Z87.442

## 2022-08-17 HISTORY — DX: Malignant (primary) neoplasm, unspecified: C80.1

## 2022-08-17 NOTE — Progress Notes (Signed)
Anesthesia Chart Review   Case: [id number] Date/Time: 08/18/22 0715   Procedure: INSERTION PORT-A-CATH - 60   Anesthesia type: General   Pre-op diagnosis: rectal cancer   [facility] ROOM 01 / [facility]   Surgeons: Andria Meuse, MD       DISCUSSION:77 y.o. smoker with h/o HTN, CAD (NSTEMI in 2015 related to diseased RCA left alone and again on July 2023. Due to recurrent angina pectoris, underwent repeat angiography and successful angioplasty to D1 branch of LAD on 8/22/202), rectal cancer scheduled for above procedure 08/18/2022 with Dr. Marin Olp.   Pt last seen by cardiology 07/29/2022. Per OV note, "Patient has not had any recurrence of angina pectoris. Since it has been >6 months since last angioplasty, he can now discontinue Plavix. He is also being referred for surgery and also oncology for new diagnosis of rectal cancer."  Blood pressure with excellent control, no significant change in carotid stenosis, he will have a repeat duplex in 6 months.   Anticipate pt can proceed with planned procedure barring acute status change.   VS: There were no vitals taken for this visit.  PROVIDERS: Greta Doom, MD is PCP   [name], MD is Cardiologist  LABS:  labs pending, same day workup (all labs ordered are listed, but only abnormal results are displayed)  Labs Reviewed - No data to display   IMAGES: Carotid artery duplex 07/22/2022: Duplex suggests stenosis in the right internal carotid artery (50-69%). Duplex suggests stenosis in the left internal carotid artery (1-15%). <50% stenosis in the left external carotid artery. Antegrade left vertebral artery flow. Compared to the study done on 01/19/2022, no significant change. Follow up in six months is appropriate if clinically indicated.    EKG:   CV: Echo 11/20/2021 1. Left ventricular ejection fraction, by estimation, is 50 to 55%. The  left ventricle has low normal function. The left ventricle has no regional   wall motion abnormalities. There is mild left ventricular hypertrophy.  Left ventricular diastolic  parameters were normal. The E/e' is 13.8.   2. Right ventricular systolic function is normal. The right ventricular  size is normal.   3. The mitral valve is grossly normal. No evidence of mitral valve  regurgitation. No evidence of mitral stenosis.   4. The aortic valve is tricuspid. Aortic valve regurgitation is not  visualized. Aortic valve sclerosis is present, with no evidence of aortic  valve stenosis.  Past Medical History:  Diagnosis Date   Bronchitis    Cancer    colon   History of kidney stones    Hyperlipidemia    Hypertension    Myocardial infarction     Past Surgical History:  Procedure Laterality Date   BIOPSY  07/24/2022   Procedure: BIOPSY;  Surgeon: Jeani Hawking, MD;  Location: [facility];  Service: Gastroenterology;;   COLONOSCOPY WITH PROPOFOL N/A 07/24/2022   Procedure: COLONOSCOPY WITH PROPOFOL;  Surgeon: Jeani Hawking, MD;  Location: [facility];  Service: Gastroenterology;  Laterality: N/A;   CORONARY STENT INTERVENTION N/A 12/23/2021   Procedure: CORONARY STENT INTERVENTION;  Surgeon: [name], MD;  [facility] CV LAB;  Service: Cardiovascular;  Laterality: N/A;   LEFT HEART CATH AND CORONARY ANGIOGRAPHY N/A 11/19/2021   Procedure: LEFT HEART CATH AND CORONARY ANGIOGRAPHY;  Surgeon: Elder Negus, MD;  [facility];  Service: Cardiovascular;  Laterality: N/A;   LEFT HEART CATHETERIZATION WITH CORONARY ANGIOGRAM N/A 01/15/2014   Procedure: LEFT HEART CATHETERIZATION WITH  CORONARY ANGIOGRAM;  Surgeon: Micheline Chapman, MD;  Location: [facility] CATH LAB;  Service: Cardiovascular;  Laterality: N/A;    MEDICATIONS:  amLODipine (NORVASC) 5 MG tablet   aspirin 81 MG EC tablet   atorvastatin (LIPITOR) 80 MG tablet   cetirizine (ZYRTEC) 10 MG tablet   cloNIDine (CATAPRES) 0.2 MG tablet   diphenhydramine-acetaminophen (TYLENOL PM)  25-500 MG TABS tablet   ezetimibe (ZETIA) 10 MG tablet   isosorbide mononitrate (IMDUR) 30 MG 24 hr tablet   MAGNESIUM PO   metoprolol succinate (TOPROL-XL) 25 MG 24 hr tablet   nitroGLYCERIN (NITROSTAT) 0.4 MG SL tablet   Omega-3 Fatty Acids (FISH OIL PO)   omeprazole (PRILOSEC OTC) 20 MG tablet   OVER THE COUNTER MEDICATION   polyethylene glycol (MIRALAX / GLYCOLAX) 17 g packet   Probiotic Product (RESTORA) CAPS   tamsulosin (FLOMAX) 0.4 MG CAPS capsule   TURMERIC PO   No current facility-administered medications for this encounter.     Jodell Cipro Ward, PA-C [facility] Pre-Surgical Testing [phone]

## 2022-08-17 NOTE — Progress Notes (Signed)
Called patient to let him know that Dr. Cliffton Asters and Dr. Truett Perna think he needs a baseline CT scan prior to RT/chemo since it had been so [facility]. He understands and agrees.

## 2022-08-17 NOTE — Anesthesia Preprocedure Evaluation (Signed)
Anesthesia Evaluation  Patient identified by MRN, date of birth, ID band Patient awake    Reviewed: Allergy & Precautions, NPO status , Patient's Chart, lab work & pertinent test results  History of Anesthesia Complications Negative for: history of anesthetic complications  Airway Mallampati: III  TM Distance: >3 FB Neck ROM: Full    Dental  (+) Dental Advisory Given   Pulmonary Current Smoker and Patient abstained from smoking.   Pulmonary exam normal        Cardiovascular hypertension, Pt. on medications and Pt. on home beta blockers (-) angina + CAD, + Past MI, + Cardiac Stents and + Peripheral Vascular Disease  Normal cardiovascular exam   '24 Carotid US - 50-69% right ICAS  '23 TTE - EF 50 to 55%.  There is mild left ventricular hypertrophy. No valve issues     Neuro/Psych negative neurological ROS  negative psych ROS   GI/Hepatic Neg liver ROS,,, Colon cancer    Endo/Other  negative endocrine ROS    Renal/GU negative Renal ROS     Musculoskeletal negative musculoskeletal ROS (+)    Abdominal   Peds  Hematology negative hematology ROS (+)   Anesthesia Other Findings   Reproductive/Obstetrics                             Anesthesia Physical Anesthesia Plan  ASA: 3  Anesthesia Plan: General   Post-op Pain Management: Tylenol PO (pre-op)* and Minimal or no pain anticipated   Induction: Intravenous  PONV Risk Score and Plan: 1 and Treatment may vary due to age or medical condition and Ondansetron  Airway Management Planned: LMA  Additional Equipment: None  Intra-op Plan:   Post-operative Plan: Extubation in OR  Informed Consent: I have reviewed the patients History and Physical, chart, labs and discussed the procedure including the risks, benefits and alternatives for the proposed anesthesia with the patient or authorized representative who has indicated his/her  understanding and acceptance.     Dental advisory given  Plan Discussed with: CRNA and Anesthesiologist  Anesthesia Plan Comments:         Anesthesia Quick Evaluation

## 2022-08-17 NOTE — Progress Notes (Addendum)
Anesthesia Review:  PCP:  Edison Nasuti  Cardiologist : DR Jacinto Halim- LOV 07/29/22  Oncology=- DR Truett Perna  LOV 08/05/22  Chest x-ray : 08/10/22  EKG : 07/29/22  Carotifds- 07/22/22  Echo : 7.20.23  Stress test: Cardiac Cath :  11/19/21  Activity level: can do a flight of stairs without difficulty  Sleep Study/ CPAP : none  Fasting Blood Sugar :      / Checks Blood Sugar -- times a day:   Blood Thinner/ Instructions /Last Dose: ASA / Instructions/ Last Dose :    Stents- 8 /22/23  PT ocmpleted med hx and instrucitons via phone on 08/17/22.  PT aware to completed dial shower tonite and in am.  PT aware to call Pharmacy to reconcile  meds and to call [phone] regarding insurance info and [place] info.     Smoker

## 2022-08-18 ENCOUNTER — Ambulatory Visit (HOSPITAL_COMMUNITY): Payer: No Typology Code available for payment source | Admitting: Anesthesiology

## 2022-08-18 ENCOUNTER — Other Ambulatory Visit: Payer: Self-pay

## 2022-08-18 ENCOUNTER — Encounter (HOSPITAL_COMMUNITY): Payer: Self-pay | Admitting: Surgery

## 2022-08-18 ENCOUNTER — Ambulatory Visit (HOSPITAL_COMMUNITY): Payer: No Typology Code available for payment source

## 2022-08-18 ENCOUNTER — Ambulatory Visit (HOSPITAL_COMMUNITY)
Admission: RE | Admit: 2022-08-18 | Discharge: 2022-08-18 | Disposition: A | Discharge status: Home / Self Care | Payer: No Typology Code available for payment source | Attending: Surgery | Admitting: Surgery

## 2022-08-18 ENCOUNTER — Ambulatory Visit (HOSPITAL_BASED_OUTPATIENT_CLINIC_OR_DEPARTMENT_OTHER): Payer: No Typology Code available for payment source | Admitting: Anesthesiology

## 2022-08-18 ENCOUNTER — Encounter (HOSPITAL_COMMUNITY)
Admission: RE | Disposition: A | Discharge status: Home / Self Care | Payer: Self-pay | Source: Home / Self Care | Attending: Surgery

## 2022-08-18 DIAGNOSIS — I10 Essential (primary) hypertension: Secondary | ICD-10-CM

## 2022-08-18 DIAGNOSIS — F17210 Nicotine dependence, cigarettes, uncomplicated: Secondary | ICD-10-CM | POA: Diagnosis not present

## 2022-08-18 DIAGNOSIS — F17290 Nicotine dependence, other tobacco product, uncomplicated: Secondary | ICD-10-CM | POA: Insufficient documentation

## 2022-08-18 DIAGNOSIS — I739 Peripheral vascular disease, unspecified: Secondary | ICD-10-CM | POA: Insufficient documentation

## 2022-08-18 DIAGNOSIS — I252 Old myocardial infarction: Secondary | ICD-10-CM | POA: Insufficient documentation

## 2022-08-18 DIAGNOSIS — C20 Malignant neoplasm of rectum: Secondary | ICD-10-CM

## 2022-08-18 DIAGNOSIS — Z955 Presence of coronary angioplasty implant and graft: Secondary | ICD-10-CM | POA: Diagnosis not present

## 2022-08-18 DIAGNOSIS — I2510 Atherosclerotic heart disease of native coronary artery without angina pectoris: Secondary | ICD-10-CM

## 2022-08-18 DIAGNOSIS — Z01818 Encounter for other preprocedural examination: Secondary | ICD-10-CM

## 2022-08-18 HISTORY — PX: PORTACATH PLACEMENT: SHX2246

## 2022-08-18 LAB — CBC
HCT: 38.3 % — ABNORMAL LOW (ref 39.0–52.0)
Hemoglobin: 13.2 g/dL (ref 13.0–17.0)
MCH: 32.6 pg (ref 26.0–34.0)
MCHC: 34.5 g/dL (ref 30.0–36.0)
MCV: 94.6 fL (ref 80.0–100.0)
Platelets: 202 10*3/uL (ref 150–400)
RBC: 4.05 MIL/uL — ABNORMAL LOW (ref 4.22–5.81)
RDW: 12.5 % (ref 11.5–15.5)
WBC: 6 10*3/uL (ref 4.0–10.5)
nRBC: 0 % (ref 0.0–0.2)

## 2022-08-18 LAB — BASIC METABOLIC PANEL
Anion gap: 6 (ref 5–15)
BUN: 13 mg/dL (ref 8–23)
CO2: 26 mmol/L (ref 22–32)
Calcium: 9.1 mg/dL (ref 8.9–10.3)
Chloride: 101 mmol/L (ref 98–111)
Creatinine, Ser: 0.96 mg/dL (ref 0.61–1.24)
GFR, Estimated: >60 mL/min (ref >60)
Glucose, Bld: 104 mg/dL — ABNORMAL HIGH (ref 70–99)
Potassium: 5.2 mmol/L — ABNORMAL HIGH (ref 3.5–5.1)
Sodium: 133 mmol/L — ABNORMAL LOW (ref 135–145)

## 2022-08-18 SURGERY — INSERTION, TUNNELED CENTRAL VENOUS DEVICE, WITH PORT
Anesthesia: General

## 2022-08-18 MED ORDER — HEPARIN 6000 UNIT IRRIGATION SOLUTION
Freq: Once | Status: AC
  Administered 2022-08-18: 1
  Filled 2022-08-18: qty 6000

## 2022-08-18 MED ORDER — TRAMADOL HCL 50 MG PO TABS: 50.0000 mg | ORAL_TABLET | Freq: Four times a day (QID) | ORAL | 0 refills | Status: AC | PRN

## 2022-08-18 MED ORDER — LACTATED RINGERS IV SOLN: INTRAVENOUS | Status: DC

## 2022-08-18 MED ORDER — FENTANYL CITRATE PF 50 MCG/ML IJ SOSY: 25.0000 ug | PREFILLED_SYRINGE | INTRAMUSCULAR | Status: DC | PRN

## 2022-08-18 MED ORDER — EPHEDRINE 5 MG/ML INJ
INTRAVENOUS | Status: AC
  Filled 2022-08-18: qty 5

## 2022-08-18 MED ORDER — ONDANSETRON HCL 4 MG/2ML IJ SOLN: 4.0000 mg | Freq: Once | INTRAMUSCULAR | Status: DC | PRN

## 2022-08-18 MED ORDER — LIDOCAINE-EPINEPHRINE 1 %-1:100000 IJ SOLN
INTRAMUSCULAR | Status: DC | PRN
  Administered 2022-08-18: 10 mL

## 2022-08-18 MED ORDER — PHENYLEPHRINE 80 MCG/ML (10ML) SYRINGE FOR IV PUSH (FOR BLOOD PRESSURE SUPPORT)
PREFILLED_SYRINGE | INTRAVENOUS | Status: AC
  Filled 2022-08-18: qty 10

## 2022-08-18 MED ORDER — CHLORHEXIDINE GLUCONATE CLOTH 2 % EX PADS: 6.0000 | MEDICATED_PAD | Freq: Once | CUTANEOUS | Status: DC

## 2022-08-18 MED ORDER — 0.9 % SODIUM CHLORIDE (POUR BTL) OPTIME
TOPICAL | Status: DC | PRN
  Administered 2022-08-18: 1000 mL

## 2022-08-18 MED ORDER — CHLORHEXIDINE GLUCONATE 0.12 % MT SOLN: 15.0000 mL | Freq: Once | OROMUCOSAL | Status: DC

## 2022-08-18 MED ORDER — FENTANYL CITRATE (PF) 100 MCG/2ML IJ SOLN
INTRAMUSCULAR | Status: DC | PRN
  Administered 2022-08-18 (×4): 25 ug via INTRAVENOUS

## 2022-08-18 MED ORDER — GLYCOPYRROLATE PF 0.2 MG/ML IJ SOSY
PREFILLED_SYRINGE | INTRAMUSCULAR | Status: DC | PRN
  Administered 2022-08-18: .4 mg via INTRAVENOUS

## 2022-08-18 MED ORDER — LIDOCAINE 2% (20 MG/ML) 5 ML SYRINGE
INTRAMUSCULAR | Status: DC | PRN
  Administered 2022-08-18: 60 mg via INTRAVENOUS

## 2022-08-18 MED ORDER — CEFAZOLIN SODIUM-DEXTROSE 2-4 GM/100ML-% IV SOLN
2.0000 g | INTRAVENOUS | Status: AC
  Administered 2022-08-18: 2 g via INTRAVENOUS
  Filled 2022-08-18: qty 100

## 2022-08-18 MED ORDER — GLYCOPYRROLATE 0.2 MG/ML IJ SOLN
INTRAMUSCULAR | Status: AC
  Filled 2022-08-18: qty 1

## 2022-08-18 MED ORDER — ONDANSETRON HCL 4 MG/2ML IJ SOLN
INTRAMUSCULAR | Status: AC
  Filled 2022-08-18: qty 2

## 2022-08-18 MED ORDER — OXYCODONE HCL 5 MG PO TABS: 5.0000 mg | ORAL_TABLET | Freq: Once | ORAL | Status: DC | PRN

## 2022-08-18 MED ORDER — PROPOFOL 10 MG/ML IV BOLUS
INTRAVENOUS | Status: DC | PRN
  Administered 2022-08-18: 150 mg via INTRAVENOUS

## 2022-08-18 MED ORDER — LIDOCAINE HCL (PF) 2 % IJ SOLN
INTRAMUSCULAR | Status: AC
  Filled 2022-08-18: qty 5

## 2022-08-18 MED ORDER — DEXAMETHASONE SODIUM PHOSPHATE 10 MG/ML IJ SOLN
INTRAMUSCULAR | Status: DC | PRN
  Administered 2022-08-18: 10 mg via INTRAVENOUS

## 2022-08-18 MED ORDER — IOHEXOL 300 MG/ML  SOLN
INTRAMUSCULAR | Status: DC | PRN
  Administered 2022-08-18: 2 mL

## 2022-08-18 MED ORDER — EPHEDRINE SULFATE-NACL 50-0.9 MG/10ML-% IV SOSY
PREFILLED_SYRINGE | INTRAVENOUS | Status: DC | PRN
  Administered 2022-08-18 (×2): 5 mg via INTRAVENOUS

## 2022-08-18 MED ORDER — ACETAMINOPHEN 500 MG PO TABS
1000.0000 mg | ORAL_TABLET | ORAL | Status: AC
  Administered 2022-08-18: 1000 mg via ORAL
  Filled 2022-08-18: qty 2

## 2022-08-18 MED ORDER — FENTANYL CITRATE (PF) 100 MCG/2ML IJ SOLN
INTRAMUSCULAR | Status: AC
  Filled 2022-08-18: qty 2

## 2022-08-18 MED ORDER — DEXAMETHASONE SODIUM PHOSPHATE 10 MG/ML IJ SOLN
INTRAMUSCULAR | Status: AC
  Filled 2022-08-18: qty 1

## 2022-08-18 MED ORDER — ONDANSETRON HCL 4 MG/2ML IJ SOLN
INTRAMUSCULAR | Status: DC | PRN
  Administered 2022-08-18: 4 mg via INTRAVENOUS

## 2022-08-18 MED ORDER — OXYCODONE HCL 5 MG/5ML PO SOLN: 5.0000 mg | Freq: Once | ORAL | Status: DC | PRN

## 2022-08-18 MED ORDER — PROPOFOL 10 MG/ML IV BOLUS
INTRAVENOUS | Status: AC
  Filled 2022-08-18: qty 20

## 2022-08-18 MED ORDER — HEPARIN SOD (PORK) LOCK FLUSH 100 UNIT/ML IV SOLN
INTRAVENOUS | Status: DC | PRN
  Administered 2022-08-18: 500 [IU] via INTRAVENOUS

## 2022-08-18 MED ORDER — HEPARIN SOD (PORK) LOCK FLUSH 100 UNIT/ML IV SOLN
INTRAVENOUS | Status: AC
  Filled 2022-08-18: qty 5

## 2022-08-18 MED ORDER — LIDOCAINE-EPINEPHRINE 1 %-1:100000 IJ SOLN
INTRAMUSCULAR | Status: AC
  Filled 2022-08-18: qty 1

## 2022-08-18 SURGICAL SUPPLY — 40 items
ADH SKN CLS APL DERMABOND .7 (GAUZE/BANDAGES/DRESSINGS) ×1
APL PRP STRL LF DISP 70% ISPRP (MISCELLANEOUS) ×1
BAG COUNTER SPONGE SURGICOUNT (BAG) IMPLANT
BAG DECANTER FOR FLEXI CONT (MISCELLANEOUS) ×1 IMPLANT
BAG SPNG CNTER NS LX DISP (BAG)
BLADE SURG 15 STRL LF DISP TIS (BLADE) ×1 IMPLANT
BLADE SURG 15 STRL SS (BLADE) ×1
BLADE SURG SZ11 CARB STEEL (BLADE) ×1 IMPLANT
CHLORAPREP W/TINT 26 (MISCELLANEOUS) ×1 IMPLANT
COVER PROBE U/S 5X48 (MISCELLANEOUS) ×1 IMPLANT
DERMABOND ADVANCED .7 DNX12 (GAUZE/BANDAGES/DRESSINGS) IMPLANT
DRAPE C-ARM 42X120 X-RAY (DRAPES) ×1 IMPLANT
DRAPE LAPAROSCOPIC ABDOMINAL (DRAPES) ×1 IMPLANT
DRSG TEGADERM 2-3/8X2-3/4 SM (GAUZE/BANDAGES/DRESSINGS) IMPLANT
DRSG TEGADERM 4X4.75 (GAUZE/BANDAGES/DRESSINGS) IMPLANT
ELECT REM PT RETURN 15FT ADLT (MISCELLANEOUS) ×1 IMPLANT
GAUZE 4X4 16PLY ~~LOC~~+RFID DBL (SPONGE) ×1 IMPLANT
GAUZE SPONGE 4X4 12PLY STRL (GAUZE/BANDAGES/DRESSINGS) IMPLANT
GLOVE BIO SURGEON STRL SZ7.5 (GLOVE) ×1 IMPLANT
GLOVE INDICATOR 8.0 STRL GRN (GLOVE) ×1 IMPLANT
GOWN STRL REUS W/ TWL XL LVL3 (GOWN DISPOSABLE) ×1 IMPLANT
GOWN STRL REUS W/TWL XL LVL3 (GOWN DISPOSABLE) ×1
KIT BASIN OR (CUSTOM PROCEDURE TRAY) ×1 IMPLANT
KIT PORT POWER 8FR ISP CVUE (Port) ×1 IMPLANT
KIT TURNOVER KIT A (KITS) IMPLANT
NDL HYPO 25X1 1.5 SAFETY (NEEDLE) ×1 IMPLANT
NEEDLE HYPO 25X1 1.5 SAFETY (NEEDLE) ×1 IMPLANT
NS IRRIG 1000ML POUR BTL (IV SOLUTION) ×1 IMPLANT
PACK BASIC VI WITH GOWN DISP (CUSTOM PROCEDURE TRAY) ×1 IMPLANT
PENCIL SMOKE EVACUATOR (MISCELLANEOUS) ×1 IMPLANT
SPIKE FLUID TRANSFER (MISCELLANEOUS) ×1 IMPLANT
STRIP CLOSURE SKIN 1/2X4 (GAUZE/BANDAGES/DRESSINGS) IMPLANT
SUT MNCRL AB 4-0 PS2 18 (SUTURE) ×1 IMPLANT
SUT PROLENE 2 0 SH DA (SUTURE) ×1 IMPLANT
SUT VIC AB 3-0 SH 27 (SUTURE) ×1
SUT VIC AB 3-0 SH 27XBRD (SUTURE) ×1 IMPLANT
SYR 10ML LL (SYRINGE) ×1 IMPLANT
SYR 20ML LL LF (SYRINGE) ×2 IMPLANT
TOWEL OR 17X26 10 PK STRL BLUE (TOWEL DISPOSABLE) ×1 IMPLANT
TOWEL OR NON WOVEN STRL DISP B (DISPOSABLE) ×1 IMPLANT

## 2022-08-18 NOTE — Op Note (Signed)
08/18/2022  8:40 AM  PATIENT:  Ronald [name]  77 y.o. male  Patient Care Team: Greta Doom, MD as PCP - General (Internal Medicine) [name], MD as Consulting Physician (Cardiology)  PRE-OPERATIVE DIAGNOSIS:  Rectal cancer  POST-OPERATIVE DIAGNOSIS:  Same  PROCEDURE: Placement of right internal jugular vein portacatheter with ultrasound and fluoroscopic guidance  SURGEON:  Stephanie Coup. Trinh Sanjose, MD  ASSISTANT: OR staff  ANESTHESIA:   local and general  COUNTS:  Sponge, needle and instrument counts were reported correct x2 at the conclusion of the operation.  EBL: 5 mL  DRAINS: None  SPECIMEN: None  COMPLICATIONS: None  FINDINGS: Under ultrasound guidance (photo below demonstrating longitudinal view of jugular vein with wire in place), the right internal jugular vein was accessed.  Fluoroscopy was used to confirm port location.  At conclusion, rests at the cavoatrial junction. Aspirates and flushes freely. Locked with heparinized saline   DISPOSITION: PACU in satisfactory condition  DESCRIPTION: The patient was identified in preop holding and taken to the OR where he was placed on the operating room table. SCDs were placed. General anesthesia was induced without difficulty.  The right neck was surveyed with an ultrasound demonstrating standard venous anatomy.  He was then prepped and draped in the usual sterile fashion. A surgical timeout was performed indicating the correct patient, procedure, positioning and need for preoperative antibiotics.   The area of the planned access site is anesthetized with 1% lidocaine with epinephrine.  Under ultrasound guidance, on the first attempt, the right internal jugular vein is accessed.  Dark red venous blood under low pressure is noted. The J-wire was advanced.  Fluoroscopy confirms that the J-wire rests within the right hemithorax.  The wires also visualized within the lumen of the jugular vein all the way down to and below the  level of the clavicle.  The access needle had been removed.  A dilator and peel-away sheath was then advanced over the wire under fluoroscopy.  The J-wire was removed.  Port tubing is brought onto the field and flushed.  This is then advanced into the peel-away sheath.  The sheath is then cracked and peeled.  Fluoroscopy demonstrates the tubing to be well within the inferior vena cava.  The tubing is then withdrawn until it rests within the right atrium.  Attention is then directed to creating the port pocket.  Over the right pectoralis, the skin is incised.  Subcutaneous tissue divided electrocautery and the pectoralis fascia is identified.  The port pocket was created.  Local anesthetic is infiltrated.  2-0 Prolene were then used to anchor the port to the pectoralis fascia.  The tubing is the tunneled with a tendon pulling a forcep. 2 cc of Omnipaque is instilled into the port tubing.  Under fluoroscopy, the tubing is then carefully withdrawn until at rest at the cavoatrial junction.  The tubing is then well without cut at the level of the pocket.  Using the helping adapter for the tubing, it is then connected to the portacatheter and locked in place.  The Prolene sutures were then tied.  The the port is accessed and found to aspirate blood and freely flush.  This is then locked with heparinized saline.  The port pocket was then closed in layers using 3-0 Vicryl deep dermal sutures followed by running 4-0 Monocryl subcuticular suture.  All sponge, needle, and instrument counts were reported correct.  The wounds are washed and dried and Dermabond is applied.  He is awake from  anesthesia, extubated, transferred to a stretcher for transport to recovery in satisfactory condition.  A post procedure chest x-ray has been ordered.

## 2022-08-18 NOTE — Anesthesia Postprocedure Evaluation (Signed)
Anesthesia Post Note  Patient: Ronald [name]  Procedure(s) Performed: INSERTION PORT-A-CATH     Patient location during evaluation: PACU Anesthesia Type: General Level of consciousness: awake and alert Pain management: pain level controlled Vital Signs Assessment: post-procedure vital signs reviewed and stable Respiratory status: spontaneous breathing, nonlabored ventilation and respiratory function stable Cardiovascular status: stable and blood pressure returned to baseline Anesthetic complications: no   No notable events documented.  Last Vitals:  Vitals:   08/18/22 0925 08/18/22 0930  BP: (!) 160/82 (!) 166/75  Pulse: 76   Resp:    Temp: 36.6 C   SpO2: 99% 100%    Last Pain:  Vitals:   08/18/22 0930  TempSrc:   PainSc: 0-No pain                 Beryle Lathe

## 2022-08-18 NOTE — H&P (Signed)
CC: Here today for port-a-cath  HPI: Ronald [name] is an 77 y.o. male with history of HTN, HLD, CAD (s/p MI, angioplasty), BPH, cigar use, whom is seen in the office today as a referral by Dr. Elnoria Howard for evaluation of rectal cancer.  He underwent colonoscopy with Dr. Elnoria Howard 07/24/2022: - Malignant tumor in the rectum. Biopsied. - Diverticulosis in the entire examined colon  PATH -moderately differentiated invasive adenocarcinoma, mismatch repair protein expression intact.  CT abdomen/pelvis 04/07/2022-cardiac enlargement, hepatic steatosis mild left adrenal nodularity stable compared to CT chest March 2020, small nonobstructing bilateral renal calculi, extensive colonic diverticula  CT chest 06/03/22-6 mm nodule in the superior right lower lobe, new from 07/12/2018, new 1 to 2 mm nodule within the left upper lobe  CEA elevated 07/24/2022.  MRI pelvis 08/07/22: Rectal adenocarcinoma T stage: T4b, due to invasion of the inferior right levator ani.  Rectal adenocarcinoma N stage: N1  Distance from tumor to the internal anal sphincter is 0 cm; tumor involvement of the upper anal sphincter is noted.  Following with Dr. Truett Perna with plans underway for TNT.  His case was presented at multidisciplinary tumor board  He is here today with his wife. He reports that beginning back in December he began having some rectal pain and some bleeding. He had just had some cardiac stents placed and therefore had delayed undergoing any sort of workup for this at that time. Currently has been taking MiraLAX and some stool softeners for the last month. Still has some rectal discomfort particularly with sitting on firm surfaces.   He was just seen 18 hrs ago by me in the office. He denies any changes in his health overnight. Reports he is ready for surgery. Here with his wife.  Past Medical History:  Diagnosis Date   Bronchitis    Cancer    colon   History of kidney stones    Hyperlipidemia     Hypertension    Myocardial infarction     Past Surgical History:  Procedure Laterality Date   BIOPSY  07/24/2022   Procedure: BIOPSY;  Surgeon: Jeani Hawking, MD;  Location: [facility];  Service: Gastroenterology;;   COLONOSCOPY WITH PROPOFOL N/A 07/24/2022   Procedure: COLONOSCOPY WITH PROPOFOL;  Surgeon: Jeani Hawking, MD;  Location: [facility];  Service: Gastroenterology;  Laterality: N/A;   CORONARY STENT INTERVENTION N/A 12/23/2021   Procedure: CORONARY STENT INTERVENTION;  Surgeon: [name], MD;  Location: [facility];  Service: Cardiovascular;  Laterality: N/A;   LEFT HEART CATH AND CORONARY ANGIOGRAPHY N/A 11/19/2021   Procedure: LEFT HEART CATH AND CORONARY ANGIOGRAPHY;  Surgeon: Elder Negus, MD;  Location: [facility] CV [facility];  Service: Cardiovascular;  Laterality: N/A;   LEFT HEART CATHETERIZATION WITH CORONARY ANGIOGRAM N/A 01/15/2014   Procedure: LEFT HEART CATHETERIZATION WITH CORONARY ANGIOGRAM;  Surgeon: Micheline Chapman, MD;  Location: [facility] C[facility];  Service: Cardiovascular;  Laterality: N/A;    History reviewed. No pertinent family history.  Social:  reports that he has been smoking cigars. He has never used smokeless tobacco. He reports current alcohol use. He reports that he does not use drugs.  Allergies:  Allergies  Allergen Reactions   Angiotensin Receptor Blockers Diarrhea   Beta Adrenergic Blockers Other (See Comments)    Fatigue   Zestril [Lisinopril] Rash    Medications: I have reviewed the patient's current medications.  Results for orders placed or performed during the hospital encounter of 08/18/22 (from the past 48 hour(s))  CBC     Status: Abnormal   Collection Time: 08/18/22  6:48 AM  Result Value Ref Range   WBC 6.0 4.0 - 10.5 K/uL   RBC 4.05 (L) 4.22 - 5.81 MIL/uL   Hemoglobin 13.2 13.0 - 17.0 g/dL   HCT 16.1 (L) 09.6 - 04.5 %   MCV 94.6 80.0 - 100.0 fL   MCH 32.6 26.0 - 34.0 pg   MCHC 34.5 30.0 - 36.0 g/dL   RDW 40.9 81.1  - 91.4 %   Platelets 202 150 - 400 K/uL   nRBC 0.0 0.0 - 0.2 %    Comment: Performed at [facility], [address], [place], [place] [address]    No results found.  ROS - all of the below systems have been reviewed with the patient and positives are indicated with bold text General: chills, fever or night sweats Eyes: blurry vision or double vision ENT: epistaxis or sore throat Allergy/Immunology: itchy/watery eyes or nasal congestion Hematologic/Lymphatic: bleeding problems, blood clots or swollen lymph nodes Endocrine: temperature intolerance or unexpected weight changes Breast: new or changing breast lumps or nipple discharge Resp: cough, shortness of breath, or wheezing CV: chest pain or dyspnea on exertion GI: as per HPI GU: dysuria, trouble voiding, or hematuria MSK: joint pain or joint stiffness Neuro: TIA or stroke symptoms Derm: pruritus and skin lesion changes Psych: anxiety and depression  PE Blood pressure 131/64, pulse (!) 59, temperature 97.6 F (36.4 C), temperature source Oral, resp. rate 18, height 6' (1.829 m), weight 76.7 kg, SpO2 98 %. Constitutional: NAD; conversant Eyes: Moist conjunctiva Lungs: Normal respiratory effort CV: RRR MSK: Normal range of motion of extremities Psychiatric: Appropriate affect  Results for orders placed or performed during the hospital encounter of 08/18/22 (from the past 48 hour(s))  CBC     Status: Abnormal   Collection Time: 08/18/22  6:48 AM  Result Value Ref Range   WBC 6.0 4.0 - 10.5 K/uL   RBC 4.05 (L) 4.22 - 5.81 MIL/uL   Hemoglobin 13.2 13.0 - 17.0 g/dL   HCT 62.1 (L) 30.8 - 65.7 %   MCV 94.6 80.0 - 100.0 fL   MCH 32.6 26.0 - 34.0 pg   MCHC 34.5 30.0 - 36.0 g/dL   RDW 84.6 96.2 - 95.2 %   Platelets 202 150 - 400 K/uL   nRBC 0.0 0.0 - 0.2 %    Comment: Performed at [facility], [address] [place], [place] [address]    No results found.  A/P: Ronald [name] is  an 77 y.o. male with hx of HTN, HLD, CAD (s/p MI, angioplasty), BPH, cigar use here for evaluation of distal rectal cancer Colonoscopy: 07/24/2022-distal rectal cancer biopsy-proven MRI P 08/07/22 - [id number] CT CAP 04/2022 - 05/2022 -a few new small lung lesions are visible.  -Could have small focuses of metastatic disease based on the new lesion seen on his chest CT. I will message Dr. Truett Perna and discuss potentially updating his staging imaging. -He is currently scheduled to start systemic treatment with planned TNT. -Spent time today discussing the relevant anatomy physiology the GI tract and pathophysiology of colorectal cancer.  -OR today for port-a-catheter placement for systemic therapy  -The relevant central vascular and neck anatomy was discussed with him. We spent time discussing port-a-catheter placement. The planned procedure, material risks (including, but not limited to, pain, bleeding, damage to surrounding blood vessels, need for additional procedures or surgeries, pneumothorax, hemothorax, heart attack, stroke,  and even death were reviewed) as well as alternatives which would potentially preclude him from receiving chemotherapy. The patient's questions were answered to his satisfaction, he expressed understanding, and has elected to proceed.   Marin Olp, MD [facility]

## 2022-08-18 NOTE — Anesthesia Procedure Notes (Signed)
Procedure Name: LMA Insertion Date/Time: 08/18/2022 7:37 AM  Performed by: Orest Dikes, CRNAPre-anesthesia Checklist: Patient identified, Emergency Drugs available, Suction available and Patient being monitored Patient Re-evaluated:Patient Re-evaluated prior to induction Oxygen Delivery Method: Circle system utilized Preoxygenation: Pre-oxygenation with 100% oxygen Induction Type: IV induction LMA: LMA with gastric port inserted LMA Size: 4.0 Placement Confirmation: positive ETCO2 and breath sounds checked- equal and bilateral Tube secured with: Tape Dental Injury: Teeth and Oropharynx as per pre-operative assessment

## 2022-08-18 NOTE — Discharge Instructions (Addendum)
POST OP INSTRUCTIONS  DIET: As tolerated. Follow a light bland diet the first 24 hours after arrival home, such as soup, liquids, crackers, etc.  Be sure to include lots of fluids daily.  Avoid fast food or heavy meals as your are more likely to get nauseated.  Eat a low fat the next few days after surgery.  Take your usually prescribed home medications unless otherwise directed.  PAIN CONTROL: Pain is best controlled by a usual combination of three different methods TOGETHER: Ice/Heat Over the counter pain medication Prescription pain medication Most patients will experience some swelling and bruising around the surgical site.  Ice packs or heating pads (30-60 minutes up to 6 times a day) will help. Some people prefer to use ice alone, heat alone, alternating between ice & heat.  Experiment to what works for you.  Swelling and bruising can take several weeks to resolve.   It is helpful to take an over-the-counter pain medication regularly for the first few weeks: Ibuprofen (Motrin/Advil) - 200mg tabs - take 3 tabs (600mg) every 6 hours as needed for pain Acetaminophen (Tylenol) - you may take 650mg every 6 hours as needed. You can take this with motrin as they act differently on the body. If you are taking a narcotic pain medication that has acetaminophen in it, do not take over the counter tylenol at the same time.  Iii. NOTE: You may take both of these medications together - most patients  find it most helpful when alternating between the two (i.e. Ibuprofen at 6am,  tylenol at 9am, ibuprofen at 12pm ...) A  prescription for pain medication should be given to you upon discharge.  Take your pain medication as prescribed if your pain is not adequatly controlled with the over-the-counter pain reliefs mentioned above.  Avoid getting constipated.  Between the surgery and the pain medications, it is common to experience some constipation.  Increasing fluid intake and taking a fiber supplement (such as  Metamucil, Citrucel, FiberCon, MiraLax, etc) 1-2 times a day regularly will usually help prevent this problem from occurring.  A mild laxative (prune juice, Milk of Magnesia, MiraLax, etc) should be taken according to package directions if there are no bowel movements after 48 hours.    Dressing: Your incision is covered in Dermabond which is like sterile superglue for the skin. This will come off on it's own in a couple weeks. It is waterproof and you may bathe normally starting the day after your surgery in a shower. Avoid baths/pools/lakes/oceans until your wounds have fully healed.  ACTIVITIES as tolerated:   Avoid heavy lifting (>10lbs or 1 gallon of milk) for the next 6 weeks. You may resume regular (light) daily activities beginning the next day--such as daily self-care, walking, climbing stairs--gradually increasing activities as tolerated.  If you can walk 30 minutes without difficulty, it is safe to try more intense activity such as jogging, treadmill, bicycling, low-impact aerobics.  DO NOT PUSH THROUGH PAIN.  Let pain be your guide: If it hurts to do something, don't do it. You may drive when you are no longer taking prescription pain medication, you can comfortably wear a seatbelt, and you can safely maneuver your car and apply brakes.   FOLLOW UP in our office Please call [facility] at [phone] to set up an appointment to see your surgeon in the office for a follow-up appointment approximately 2 weeks after your surgery. Make sure that you call for this appointment the day you arrive home to   insure a convenient appointment time.  9. If you have disability or family leave forms that need to be completed, you may have them completed by your primary care physician's office; for return to work instructions, please ask our office staff and they will be happy to assist you in obtaining this documentation   When to call us [phone]: Poor pain control Reactions / problems with new  medications (rash/itching, etc)  Fever over 101.5 F (38.5 C) Inability to urinate Nausea/vomiting Worsening swelling or bruising Continued bleeding from incision. Increased pain, redness, or drainage from the incision  The clinic staff is available to answer your questions during regular business hours (8:30am-5pm).  Please don't hesitate to call and ask to speak to one of our nurses for clinical concerns.   A surgeon from [facility] is always on call at the hospitals   If you have a medical emergency, go to the nearest emergency room or call 911.  [facility] [address], [address], [place], [place]  [address] MAIN: [phone] FAX: [phone] [email or web address] 

## 2022-08-18 NOTE — Transfer of Care (Signed)
Immediate Anesthesia Transfer of Care Note  Patient: Ronald [name]  Procedure(s) Performed: INSERTION PORT-A-CATH  Patient Location: PACU  Anesthesia Type:General  Level of Consciousness: awake, alert , and oriented  Airway & Oxygen Therapy: Patient Spontanous Breathing and Patient connected to face mask oxygen  Post-op Assessment: Report given to RN and Post -op Vital signs reviewed and stable  Post vital signs: Reviewed and stable  Last Vitals:  Vitals Value Taken Time  BP 167/79 08/18/22 0841  Temp    Pulse 36 08/18/22 0841  Resp 17 08/18/22 0842  SpO2 83 % 08/18/22 0841  Vitals shown include unvalidated device data.  Last Pain:  Vitals:   08/18/22 0635  TempSrc:   PainSc: 0-No pain      Patients Stated Pain Goal: 3 (08/18/22 3202)  Complications: No notable events documented.

## 2022-08-19 ENCOUNTER — Telehealth: Payer: Self-pay | Admitting: Radiation Oncology

## 2022-08-19 ENCOUNTER — Inpatient Hospital Stay: Payer: No Typology Code available for payment source

## 2022-08-19 ENCOUNTER — Telehealth: Payer: Self-pay | Admitting: *Deleted

## 2022-08-19 ENCOUNTER — Inpatient Hospital Stay: Payer: No Typology Code available for payment source | Admitting: Nutrition

## 2022-08-19 ENCOUNTER — Ambulatory Visit: Payer: Non-veteran care

## 2022-08-19 ENCOUNTER — Ambulatory Visit: Payer: Non-veteran care | Admitting: Radiation Oncology

## 2022-08-19 ENCOUNTER — Encounter (HOSPITAL_COMMUNITY): Payer: Self-pay | Admitting: Surgery

## 2022-08-19 ENCOUNTER — Telehealth: Payer: Self-pay

## 2022-08-19 ENCOUNTER — Ambulatory Visit (HOSPITAL_BASED_OUTPATIENT_CLINIC_OR_DEPARTMENT_OTHER)
Admission: RE | Admit: 2022-08-19 | Discharge: 2022-08-19 | Disposition: A | Discharge status: Home / Self Care | Payer: No Typology Code available for payment source | Source: Ambulatory Visit | Attending: Oncology | Admitting: Oncology

## 2022-08-19 ENCOUNTER — Other Ambulatory Visit: Payer: Self-pay | Admitting: *Deleted

## 2022-08-19 ENCOUNTER — Encounter: Payer: Self-pay | Admitting: Oncology

## 2022-08-19 DIAGNOSIS — Z5111 Encounter for antineoplastic chemotherapy: Secondary | ICD-10-CM | POA: Diagnosis not present

## 2022-08-19 DIAGNOSIS — C20 Malignant neoplasm of rectum: Secondary | ICD-10-CM | POA: Diagnosis not present

## 2022-08-19 LAB — CMP (CANCER CENTER ONLY)
ALT: 17 U/L (ref 0–44)
AST: 17 U/L (ref 15–41)
Albumin: 4.3 g/dL (ref 3.5–5.0)
Alkaline Phosphatase: 58 U/L (ref 38–126)
Anion gap: 6 (ref 5–15)
BUN: 15 mg/dL (ref 8–23)
CO2: 27 mmol/L (ref 22–32)
Calcium: 9.1 mg/dL (ref 8.9–10.3)
Chloride: 99 mmol/L (ref 98–111)
Creatinine: 1.02 mg/dL (ref 0.61–1.24)
GFR, Estimated: >60 mL/min (ref >60)
Glucose, Bld: 156 mg/dL — ABNORMAL HIGH (ref 70–99)
Potassium: 3.8 mmol/L (ref 3.5–5.1)
Sodium: 132 mmol/L — ABNORMAL LOW (ref 135–145)
Total Bilirubin: 0.5 mg/dL (ref 0.3–1.2)
Total Protein: 6.5 g/dL (ref 6.5–8.1)

## 2022-08-19 LAB — CEA (ACCESS): CEA (CHCC): 9.36 ng/mL — ABNORMAL HIGH (ref 0.00–5.00)

## 2022-08-19 LAB — CBC WITH DIFFERENTIAL (CANCER CENTER ONLY)
Abs Immature Granulocytes: 0.03 10*3/uL (ref 0.00–0.07)
Basophils Absolute: 0 10*3/uL (ref 0.0–0.1)
Basophils Relative: 0 %
Eosinophils Absolute: 0 10*3/uL (ref 0.0–0.5)
Eosinophils Relative: 0 %
HCT: 33.7 % — ABNORMAL LOW (ref 39.0–52.0)
Hemoglobin: 11.4 g/dL — ABNORMAL LOW (ref 13.0–17.0)
Immature Granulocytes: 0 %
Lymphocytes Relative: 20 %
Lymphs Abs: 2.1 10*3/uL (ref 0.7–4.0)
MCH: 31.5 pg (ref 26.0–34.0)
MCHC: 33.8 g/dL (ref 30.0–36.0)
MCV: 93.1 fL (ref 80.0–100.0)
Monocytes Absolute: 1 10*3/uL (ref 0.1–1.0)
Monocytes Relative: 10 %
Neutro Abs: 7.1 10*3/uL (ref 1.7–7.7)
Neutrophils Relative %: 70 %
Platelet Count: 200 10*3/uL (ref 150–400)
RBC: 3.62 MIL/uL — ABNORMAL LOW (ref 4.22–5.81)
RDW: 12.6 % (ref 11.5–15.5)
WBC Count: 10.3 10*3/uL (ref 4.0–10.5)
nRBC: 0 % (ref 0.0–0.2)

## 2022-08-19 MED ORDER — IOHEXOL 300 MG/ML  SOLN
100.0000 mL | Freq: Once | INTRAMUSCULAR | Status: AC | PRN
  Administered 2022-08-19: 80 mL via INTRAVENOUS

## 2022-08-19 MED ORDER — PROCHLORPERAZINE MALEATE 10 MG PO TABS: 10.0000 mg | ORAL_TABLET | Freq: Four times a day (QID) | ORAL | 0 refills | Status: DC | PRN

## 2022-08-19 MED ORDER — ONDANSETRON HCL 8 MG PO TABS: 8.0000 mg | ORAL_TABLET | Freq: Three times a day (TID) | ORAL | 0 refills | Status: DC | PRN

## 2022-08-19 MED ORDER — LIDOCAINE-PRILOCAINE 2.5-2.5 % EX CREA: 1.0000 | TOPICAL_CREAM | CUTANEOUS | 0 refills | Status: AC | PRN

## 2022-08-19 NOTE — Telephone Encounter (Signed)
error 

## 2022-08-19 NOTE — Telephone Encounter (Signed)
Returned patient's wife's call regarding rescheduling today's appointment. Left message for her to call back to reschedule.

## 2022-08-19 NOTE — Telephone Encounter (Signed)
4/17 @ 12:20 pm Received call from Patient's wife Misty Stanley about rescheduling  his consult due to so many appts the same day.  She requested a later date/time due to her work schedule so she can be with patient when he has his consult.

## 2022-08-19 NOTE — Progress Notes (Signed)
The proposed treatment discussed in conference is for discussion purpose only and is not a binding recommendation.  The patients have not been physically examined, or presented with their treatment options.  Therefore, final treatment plans cannot be decided.  

## 2022-08-19 NOTE — Progress Notes (Signed)
77 year old male diagnosed with Rectal Cancer and followed by Dr. Truett Perna. Plan to begin FOLFOX.  PMH includes CAD s/p MI 2015 and 2023, HLD, HTN, PTSD, BPH.  Medications include Magnesium, Prilosec, Miralax, Turmeric  Labs include Na 133, K 5.2, Glucose 104.  Height: 6'. Weight: 169 pounds. UBW: 170-175 pounds. BMI: 22.92.  Patient has occasional loose stools. Reports appetite is decreased. He eats a big breakfast consisting of Eggs and cheese, bacon or sausage or tenderloin, Bread with gravy and coffee. He snacks on fresh fruit throughout the day. He only eats a traditional dinner on the weekends. Has been drinking one Premier Protein shake and one Powerade daily. Reports a MD told him not to eat sugar because it will increase cancer growth.  Nutrition Diagnosis: Food and Nutrition Related Knowledge Deficit related to cancer and associated treatments as evidenced by no prior need for nutrition related information.  Intervention: Educated to eat small frequent meals/snacks throughout the day. Include protein 5-6 times daily. Provided handouts on increasing calories and protein, soft protein foods and high calorie, high protein snacks. Reviewed cold sensitivity and strategies for eating after oxaliplatin. Educated on sugar and cancer and provided fact sheets. Questions answered. Contact information given.  Monitoring, Evaluation, Goals: Tolerate adequate calories and protein to minimize loss of lean body mass.  Next Visit: To be scheduled as needed.

## 2022-08-23 ENCOUNTER — Other Ambulatory Visit: Payer: Self-pay | Admitting: Oncology

## 2022-08-23 DIAGNOSIS — C20 Malignant neoplasm of rectum: Secondary | ICD-10-CM

## 2022-08-24 ENCOUNTER — Inpatient Hospital Stay: Payer: No Typology Code available for payment source

## 2022-08-24 ENCOUNTER — Inpatient Hospital Stay (HOSPITAL_BASED_OUTPATIENT_CLINIC_OR_DEPARTMENT_OTHER): Payer: No Typology Code available for payment source | Admitting: Nurse Practitioner

## 2022-08-24 ENCOUNTER — Encounter: Payer: Self-pay | Admitting: Nurse Practitioner

## 2022-08-24 VITALS — BP 124/62 | HR 60 | Resp 18

## 2022-08-24 VITALS — BP 121/73 | HR 73 | Temp 98.2°F | Resp 18 | Ht 72.0 in | Wt 167.0 lb

## 2022-08-24 DIAGNOSIS — C20 Malignant neoplasm of rectum: Secondary | ICD-10-CM

## 2022-08-24 DIAGNOSIS — Z5111 Encounter for antineoplastic chemotherapy: Secondary | ICD-10-CM | POA: Diagnosis not present

## 2022-08-24 MED ORDER — LEUCOVORIN CALCIUM INJECTION 350 MG
400.0000 mg/m2 | Freq: Once | INTRAVENOUS | Status: AC
  Administered 2022-08-24: 788 mg via INTRAVENOUS
  Filled 2022-08-24: qty 39.4

## 2022-08-24 MED ORDER — LEUCOVORIN CALCIUM INJECTION 350 MG
400.0000 mg/m2 | Freq: Once | INTRAVENOUS | Status: DC
  Filled 2022-08-24: qty 39.4

## 2022-08-24 MED ORDER — SODIUM CHLORIDE 0.9 % IV SOLN
10.0000 mg | Freq: Once | INTRAVENOUS | Status: AC
  Administered 2022-08-24: 10 mg via INTRAVENOUS
  Filled 2022-08-24: qty 10

## 2022-08-24 MED ORDER — DEXTROSE 5 % IV SOLN: Freq: Once | INTRAVENOUS | Status: AC

## 2022-08-24 MED ORDER — SODIUM CHLORIDE 0.9 % IV SOLN
2400.0000 mg/m2 | INTRAVENOUS | Status: DC
  Administered 2022-08-24: 5000 mg via INTRAVENOUS
  Filled 2022-08-24: qty 100

## 2022-08-24 MED ORDER — OXALIPLATIN CHEMO INJECTION 100 MG/20ML
85.0000 mg/m2 | Freq: Once | INTRAVENOUS | Status: AC
  Administered 2022-08-24: 165 mg via INTRAVENOUS
  Filled 2022-08-24: qty 33

## 2022-08-24 MED ORDER — PALONOSETRON HCL INJECTION 0.25 MG/5ML
0.2500 mg | Freq: Once | INTRAVENOUS | Status: AC
  Administered 2022-08-24: 0.25 mg via INTRAVENOUS
  Filled 2022-08-24: qty 5

## 2022-08-24 MED ORDER — FLUOROURACIL CHEMO INJECTION 2.5 GM/50ML
400.0000 mg/m2 | Freq: Once | INTRAVENOUS | Status: AC
  Administered 2022-08-24: 800 mg via INTRAVENOUS
  Filled 2022-08-24: qty 16

## 2022-08-24 NOTE — Patient Instructions (Signed)
Ronald [name]   Discharge Instructions: Thank you for choosing [facility] to provide your oncology and hematology care.   If you have a lab appointment with the [facility], please go directly to the [facility] and check in at the registration area.   Wear comfortable clothing and clothing appropriate for easy access to any Portacath or PICC line.   We strive to give you quality time with your provider. You may need to reschedule your appointment if you arrive late (15 or more minutes).  Arriving late affects you and other patients whose appointments are after yours.  Also, if you miss three or more appointments without notifying the office, you may be dismissed from the clinic at the provider's discretion.      For prescription refill requests, have your pharmacy contact our office and allow 72 hours for refills to be completed.    Today you received the following chemotherapy and/or immunotherapy agents Oxaliplatin (ELOXATIN), Leucovorin & Flourouracil (ADRUCIL).       To help prevent nausea and vomiting after your treatment, we encourage you to take your nausea medication as directed.  BELOW ARE SYMPTOMS THAT SHOULD BE REPORTED IMMEDIATELY: *FEVER GREATER THAN 100.4 F (38 C) OR HIGHER *CHILLS OR SWEATING *NAUSEA AND VOMITING THAT IS NOT CONTROLLED WITH YOUR NAUSEA MEDICATION *UNUSUAL SHORTNESS OF BREATH *UNUSUAL BRUISING OR BLEEDING *URINARY PROBLEMS (pain or burning when urinating, or frequent urination) *BOWEL PROBLEMS (unusual diarrhea, constipation, pain near the anus) TENDERNESS IN MOUTH AND THROAT WITH OR WITHOUT PRESENCE OF ULCERS (sore throat, sores in mouth, or a toothache) UNUSUAL RASH, SWELLING OR PAIN  UNUSUAL VAGINAL DISCHARGE OR ITCHING   Items with * indicate a potential emergency and should be followed up as soon as possible or go to the Emergency Department if any problems should occur.  Please show the  CHEMOTHERAPY ALERT CARD or IMMUNOTHERAPY ALERT CARD at check-in to the Emergency Department and triage nurse.  Should you have questions after your visit or need to cancel or reschedule your appointment, please contact [facility]  Dept: [phone]  and follow the prompts.  Office hours are 8:00 a.m. to 4:30 p.m. Monday - Friday. Please note that voicemails left after 4:00 p.m. may not be returned until the following business day.  We are closed weekends and major holidays. You have access to a nurse at all times for urgent questions. Please call the main number to the clinic Dept: [phone] and follow the prompts.   For any non-urgent questions, you may also contact your provider using MyChart. We now offer e-Visits for anyone 38 and older to request care online for non-urgent symptoms. For details visit [email or web address].   Also download the MyChart app! Go to the app store, search "MyChart", open the app, select [facility], and log in with your MyChart username and password.  Oxaliplatin Injection What is this medication? OXALIPLATIN (ox AL i PLA tin) treats colorectal cancer. It works by slowing down the growth of cancer cells. This medicine may be used for other purposes; ask your health care provider or pharmacist if you have questions. COMMON BRAND NAME(S): Eloxatin What should I tell my care team before I take this medication? They need to know if you have any of these conditions: Heart disease History of irregular heartbeat or rhythm Liver disease Low blood cell levels (white cells, red cells, and platelets) Lung or breathing disease, such as asthma Take medications that  treat or prevent blood clots Tingling of the fingers, toes, or other nerve disorder An unusual or allergic reaction to oxaliplatin, other medications, foods, dyes, or preservatives If you or your partner are pregnant or trying to get pregnant Breast-feeding How should  I use this medication? This medication is injected into a vein. It is given by your care team in a hospital or clinic setting. Talk to your care team about the use of this medication in children. Special care may be needed. Overdosage: If you think you have taken too much of this medicine contact a poison control center or emergency room at once. NOTE: This medicine is only for you. Do not share this medicine with others. What if I miss a dose? Keep appointments for follow-up doses. It is important not to miss a dose. Call your care team if you are unable to keep an appointment. What may interact with this medication? Do not take this medication with any of the following: Cisapride Dronedarone Pimozide Thioridazine This medication may also interact with the following: Aspirin and aspirin-like medications Certain medications that treat or prevent blood clots, such as warfarin, apixaban, dabigatran, and rivaroxaban Cisplatin Cyclosporine Diuretics Medications for infection, such as acyclovir, adefovir, amphotericin B, bacitracin, cidofovir, foscarnet, ganciclovir, gentamicin, pentamidine, vancomycin NSAIDs, medications for pain and inflammation, such as ibuprofen or naproxen Other medications that cause heart rhythm changes Pamidronate Zoledronic acid This list may not describe all possible interactions. Give your health care provider a list of all the medicines, herbs, non-prescription drugs, or dietary supplements you use. Also tell them if you smoke, drink alcohol, or use illegal drugs. Some items may interact with your medicine. What should I watch for while using this medication? Your condition will be monitored carefully while you are receiving this medication. You may need blood work while taking this medication. This medication may make you feel generally unwell. This is not uncommon as chemotherapy can affect healthy cells as well as cancer cells. Report any side effects. Continue  your course of treatment even though you feel ill unless your care team tells you to stop. This medication may increase your risk of getting an infection. Call your care team for advice if you get a fever, chills, sore throat, or other symptoms of a cold or flu. Do not treat yourself. Try to avoid being around people who are sick. Avoid taking medications that contain aspirin, acetaminophen, ibuprofen, naproxen, or ketoprofen unless instructed by your care team. These medications may hide a fever. Be careful brushing or flossing your teeth or using a toothpick because you may get an infection or bleed more easily. If you have any dental work done, tell your dentist you are receiving this medication. This medication can make you more sensitive to cold. Do not drink cold drinks or use ice. Cover exposed skin before coming in contact with cold temperatures or cold objects. When out in cold weather wear warm clothing and cover your mouth and nose to warm the air that goes into your lungs. Tell your care team if you get sensitive to the cold. Talk to your care team if you or your partner are pregnant or think either of you might be pregnant. This medication can cause serious birth defects if taken during pregnancy and for 9 months after the last dose. A negative pregnancy test is required before starting this medication. A reliable form of contraception is recommended while taking this medication and for 9 months after the last dose. Talk to your  care team about effective forms of contraception. Do not father a child while taking this medication and for 6 months after the last dose. Use a condom while having sex during this time period. Do not breastfeed while taking this medication and for 3 months after the last dose. This medication may cause infertility. Talk to your care team if you are concerned about your fertility. What side effects may I notice from receiving this medication? Side effects that you  should report to your care team as soon as possible: Allergic reactions--skin rash, itching, hives, swelling of the face, lips, tongue, or throat Bleeding--bloody or black, tar-like stools, vomiting blood or brown material that looks like coffee grounds, red or dark brown urine, small red or purple spots on skin, unusual bruising or bleeding Dry cough, shortness of breath or trouble breathing Heart rhythm changes--fast or irregular heartbeat, dizziness, feeling faint or lightheaded, chest pain, trouble breathing Infection--fever, chills, cough, sore throat, wounds that don't heal, pain or trouble when passing urine, general feeling of discomfort or being unwell Liver injury--right upper belly pain, loss of appetite, nausea, light-colored stool, dark yellow or brown urine, yellowing skin or eyes, unusual weakness or fatigue Low red blood cell level--unusual weakness or fatigue, dizziness, headache, trouble breathing Muscle injury--unusual weakness or fatigue, muscle pain, dark yellow or brown urine, decrease in amount of urine Pain, tingling, or numbness in the hands or feet Sudden and severe headache, confusion, change in vision, seizures, which may be signs of posterior reversible encephalopathy syndrome (PRES) Unusual bruising or bleeding Side effects that usually do not require medical attention (report to your care team if they continue or are bothersome): Diarrhea Nausea Pain, redness, or swelling with sores inside the mouth or throat Unusual weakness or fatigue Vomiting This list may not describe all possible side effects. Call your doctor for medical advice about side effects. You may report side effects to FDA at 1-800-FDA-1088. Where should I keep my medication? This medication is given in a hospital or clinic. It will not be stored at home. NOTE: This sheet is a summary. It may not cover all possible information. If you have questions about this medicine, talk to your doctor,  pharmacist, or health care provider.  2023 Elsevier/Gold Standard (2007-06-11 00:00:00)  Leucovorin Injection What is this medication? LEUCOVORIN (loo koe VOR in) prevents side effects from certain medications, such as methotrexate. It works by increasing folate levels. This helps protect healthy cells in your body. It may also be used to treat anemia caused by low levels of folate. It can also be used with fluorouracil, a type of chemotherapy, to treat colorectal cancer. It works by increasing the effects of fluorouracil in the body. This medicine may be used for other purposes; ask your health care provider or pharmacist if you have questions. What should I tell my care team before I take this medication? They need to know if you have any of these conditions: Anemia from low levels of vitamin B12 in the blood An unusual or allergic reaction to leucovorin, folic acid, other medications, foods, dyes, or preservatives Pregnant or trying to get pregnant Breastfeeding How should I use this medication? This medication is injected into a vein or a muscle. It is given by your care team in a hospital or clinic setting. Talk to your care team about the use of this medication in children. Special care may be needed. Overdosage: If you think you have taken too much of this medicine contact a poison control  center or emergency room at once. NOTE: This medicine is only for you. Do not share this medicine with others. What if I miss a dose? Keep appointments for follow-up doses. It is important not to miss your dose. Call your care team if you are unable to keep an appointment. What may interact with this medication? Capecitabine Fluorouracil Phenobarbital Phenytoin Primidone Trimethoprim;sulfamethoxazole This list may not describe all possible interactions. Give your health care provider a list of all the medicines, herbs, non-prescription drugs, or dietary supplements you use. Also tell them if you  smoke, drink alcohol, or use illegal drugs. Some items may interact with your medicine. What should I watch for while using this medication? Your condition will be monitored carefully while you are receiving this medication. This medication may increase the side effects of 5-fluorouracil. Tell your care team if you have diarrhea or mouth sores that do not get better or that get worse. What side effects may I notice from receiving this medication? Side effects that you should report to your care team as soon as possible: Allergic reactions--skin rash, itching, hives, swelling of the face, lips, tongue, or throat This list may not describe all possible side effects. Call your doctor for medical advice about side effects. You may report side effects to FDA at 1-800-FDA-1088. Where should I keep my medication? This medication is given in a hospital or clinic. It will not be stored at home. NOTE: This sheet is a summary. It may not cover all possible information. If you have questions about this medicine, talk to your doctor, pharmacist, or health care provider.  2023 Elsevier/Gold Standard (2021-08-29 00:00:00)  Fluorouracil Injection What is this medication? FLUOROURACIL (flure oh YOOR a sil) treats some types of cancer. It works by slowing down the growth of cancer cells. This medicine may be used for other purposes; ask your health care provider or pharmacist if you have questions. COMMON BRAND NAME(S): Adrucil What should I tell my care team before I take this medication? They need to know if you have any of these conditions: Blood disorders Dihydropyrimidine dehydrogenase (DPD) deficiency Infection, such as chickenpox, cold sores, herpes Kidney disease Liver disease Poor nutrition Recent or ongoing radiation therapy An unusual or allergic reaction to fluorouracil, other medications, foods, dyes, or preservatives If you or your partner are pregnant or trying to get  pregnant Breast-feeding How should I use this medication? This medication is injected into a vein. It is administered by your care team in a hospital or clinic setting. Talk to your care team about the use of this medication in children. Special care may be needed. Overdosage: If you think you have taken too much of this medicine contact a poison control center or emergency room at once. NOTE: This medicine is only for you. Do not share this medicine with others. What if I miss a dose? Keep appointments for follow-up doses. It is important not to miss your dose. Call your care team if you are unable to keep an appointment. What may interact with this medication? Do not take this medication with any of the following: Live virus vaccines This medication may also interact with the following: Medications that treat or prevent blood clots, such as warfarin, enoxaparin, dalteparin This list may not describe all possible interactions. Give your health care provider a list of all the medicines, herbs, non-prescription drugs, or dietary supplements you use. Also tell them if you smoke, drink alcohol, or use illegal drugs. Some items may interact with  your medicine. What should I watch for while using this medication? Your condition will be monitored carefully while you are receiving this medication. This medication may make you feel generally unwell. This is not uncommon as chemotherapy can affect healthy cells as well as cancer cells. Report any side effects. Continue your course of treatment even though you feel ill unless your care team tells you to stop. In some cases, you may be given additional medications to help with side effects. Follow all directions for their use. This medication may increase your risk of getting an infection. Call your care team for advice if you get a fever, chills, sore throat, or other symptoms of a cold or flu. Do not treat yourself. Try to avoid being around people who are  sick. This medication may increase your risk to bruise or bleed. Call your care team if you notice any unusual bleeding. Be careful brushing or flossing your teeth or using a toothpick because you may get an infection or bleed more easily. If you have any dental work done, tell your dentist you are receiving this medication. Avoid taking medications that contain aspirin, acetaminophen, ibuprofen, naproxen, or ketoprofen unless instructed by your care team. These medications may hide a fever. Do not treat diarrhea with over the counter products. Contact your care team if you have diarrhea that lasts more than 2 days or if it is severe and watery. This medication can make you more sensitive to the sun. Keep out of the sun. If you cannot avoid being in the sun, wear protective clothing and sunscreen. Do not use sun lamps, tanning beds, or tanning booths. Talk to your care team if you or your partner wish to become pregnant or think you might be pregnant. This medication can cause serious birth defects if taken during pregnancy and for 3 months after the last dose. A reliable form of contraception is recommended while taking this medication and for 3 months after the last dose. Talk to your care team about effective forms of contraception. Do not father a child while taking this medication and for 3 months after the last dose. Use a condom while having sex during this time period. Do not breastfeed while taking this medication. This medication may cause infertility. Talk to your care team if you are concerned about your fertility. What side effects may I notice from receiving this medication? Side effects that you should report to your care team as soon as possible: Allergic reactions--skin rash, itching, hives, swelling of the face, lips, tongue, or throat Heart attack--pain or tightness in the chest, shoulders, arms, or jaw, nausea, shortness of breath, cold or clammy skin, feeling faint or  lightheaded Heart failure--shortness of breath, swelling of the ankles, feet, or hands, sudden weight gain, unusual weakness or fatigue Heart rhythm changes--fast or irregular heartbeat, dizziness, feeling faint or lightheaded, chest pain, trouble breathing High ammonia level--unusual weakness or fatigue, confusion, loss of appetite, nausea, vomiting, seizures Infection--fever, chills, cough, sore throat, wounds that don't heal, pain or trouble when passing urine, general feeling of discomfort or being unwell Low red blood cell level--unusual weakness or fatigue, dizziness, headache, trouble breathing Pain, tingling, or numbness in the hands or feet, muscle weakness, change in vision, confusion or trouble speaking, loss of balance or coordination, trouble walking, seizures Redness, swelling, and blistering of the skin over hands and feet Severe or prolonged diarrhea Unusual bruising or bleeding Side effects that usually do not require medical attention (report to your care team  if they continue or are bothersome): Dry skin Headache Increased tears Nausea Pain, redness, or swelling with sores inside the mouth or throat Sensitivity to light Vomiting This list may not describe all possible side effects. Call your doctor for medical advice about side effects. You may report side effects to FDA at 1-800-FDA-1088. Where should I keep my medication? This medication is given in a hospital or clinic. It will not be stored at home. NOTE: This sheet is a summary. It may not cover all possible information. If you have questions about this medicine, talk to your doctor, pharmacist, or health care provider.  2023 Elsevier/Gold Standard (2021-08-19 00:00:00)  The chemotherapy medication bag should finish at 46 hours, 96 hours, or 7 days. For example, if your pump is scheduled for 46 hours and it was put on at 4:00 p.m., it should finish at 2:00 p.m. the day it is scheduled to come off regardless of your  appointment time.     Estimated time to finish at 11:00 a.m. on Wednesday 08/26/2022.   If the display on your pump reads "Low Volume" and it is beeping, take the batteries out of the pump and come to the cancer center for it to be taken off.   If the pump alarms go off prior to the pump reading "Low Volume" then call [phone] and someone can assist you.  If the plunger comes out and the chemotherapy medication is leaking out, please use your home chemo spill kit to clean up the spill. Do NOT use paper towels or other household products.  If you have problems or questions regarding your pump, please call either [phone] (24 hours a day) or the cancer center Monday-Friday 8:00 a.m.- 4:30 p.m. at the clinic number and we will assist you. If you are unable to get assistance, then go to the nearest Emergency Department and ask the staff to contact the IV team for assistance. \

## 2022-08-24 NOTE — Progress Notes (Signed)
  [facility] OFFICE PROGRESS NOTE   Diagnosis: Rectal cancer  INTERVAL HISTORY:   [name] returns as scheduled.  Rectal pain has increased.  He has mild rectal bleeding.  Bowels moving regularly with the aid of a laxative and stool softener.  No nausea or vomiting.  No pre-existing neuropathy symptoms.  Objective:  Vital signs in last 24 hours:  Blood pressure 121/73, pulse 73, temperature 98.2 F (36.8 C), temperature source Oral, resp. rate 18, height 6' (1.829 m), weight 167 lb (75.8 kg), SpO2 98 %.    HEENT: No thrush or ulcers. Resp: Lungs clear bilaterally. Cardio: Regular rate and rhythm. GI: No hepatosplenomegaly. Vascular: No leg edema. Neuro: Alert and oriented. Skin: Palms without erythema. Port-A-Cath without erythema, resolving surrounding ecchymosis.  Lab Results:  Lab Results  Component Value Date   WBC 10.3 08/19/2022   HGB 11.4 (L) 08/19/2022   HCT 33.7 (L) 08/19/2022   MCV 93.1 08/19/2022   PLT 200 08/19/2022   NEUTROABS 7.1 08/19/2022    Imaging:  No results found.  Medications: I have reviewed the patient's current medications.  Assessment/Plan: Rectal cancer Colonoscopy 07/24/2022-mass in the posterior rectum extending from the dentate line for 3 cm, biopsy-moderately differentiated invasive adenocarcinoma, mismatch repair protein expression intact CT abdomen/pelvis 04/07/2022-cardiac enlargement, hepatic steatosis mild left adrenal nodularity stable compared to chest CT March 2020, small nonobstructing bilateral renal calculi, extensive colonic diverticula CT chest 06/03/2022-6 mm nodule in the superior right lower lobe-new from 07/12/2018, new 1-2 mm nodule within the left upper lobe Elevated CEA 07/24/2022 MRI pelvis 08/06/2022-tumor at 3.5 cm from the anal verge with involvement of the upper anal sphincter, invasion of the inferior right levator ani muscle, T4b, one 5 mm left posterior perirectal node-in 1 CTs  08/19/2022-partially circumferential mass of the low rectum.  Small bilateral pulmonary nodules slightly enlarged compared to prior examination. 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.  He is scheduled to begin treatment today with FOLFOX.  We again reviewed potential toxicities.  He agrees to proceed.  We reviewed the recent CT scans/images with him at today's visit.  He understands the lung nodules are slightly larger and may indicate metastatic disease.  We will continue to monitor.  Labs from 08/19/2022 reviewed, adequate to proceed with treatment.  He will return for lab, follow-up, cycle 2 FOLFOX in 2 weeks.  We are available to see him sooner if needed.  Patient seen with Dr. Truett Perna.    Arnoldo Hildreth ANP/GNP-BC   08/24/2022  8:10 AM  This was a shared visit with Lonna Cobb.  We reviewed the chest CT findings and images with [name]  At least 2 lung nodules are slightly larger and appear suspicious for metastatic disease.  Neoadjuvant therapy followed by surgery will not be curative if these lesions reflect metastatic disease.  I recommend proceeding with FOLFOX.  We will reimage the chest after 4-6 cycles.  I was present for greater than 50% of today's visit.  I performed medical decision making.  Mancel Bale, MD

## 2022-08-24 NOTE — Progress Notes (Signed)
Patient seen by Lonna Cobb, NP today  Vitals are within treatment parameters.   Labs from 08/19/22 reviewed by Lonna Cobb, NP and are within treatment parameters.  Per physician team, patient is ready for treatment and there are NO modifications to the treatment plan.

## 2022-08-25 ENCOUNTER — Telehealth: Payer: Self-pay

## 2022-08-25 NOTE — Telephone Encounter (Signed)
24 HOUR CALL BACK  Telephone call to patient post his first time Folfox infusion. Patient denies any complains or concerns at this time. He denies any side effects and reports feeling very well. Patient knows to call the clinic if this changes. Patient will be back tomorrow 08/26/22 to get the pump off.

## 2022-08-26 ENCOUNTER — Inpatient Hospital Stay: Payer: No Typology Code available for payment source

## 2022-08-26 VITALS — BP 137/61 | HR 68 | Temp 98.2°F | Resp 18

## 2022-08-26 DIAGNOSIS — C20 Malignant neoplasm of rectum: Secondary | ICD-10-CM

## 2022-08-26 DIAGNOSIS — Z5111 Encounter for antineoplastic chemotherapy: Secondary | ICD-10-CM | POA: Diagnosis not present

## 2022-08-26 MED ORDER — HEPARIN SOD (PORK) LOCK FLUSH 100 UNIT/ML IV SOLN
500.0000 [IU] | Freq: Once | INTRAVENOUS | Status: AC | PRN
  Administered 2022-08-26: 500 [IU]

## 2022-08-26 MED ORDER — SODIUM CHLORIDE 0.9% FLUSH
10.0000 mL | INTRAVENOUS | Status: DC | PRN
  Administered 2022-08-26: 10 mL

## 2022-08-26 NOTE — Patient Instructions (Signed)

## 2022-09-02 ENCOUNTER — Encounter: Payer: Self-pay | Admitting: Oncology

## 2022-09-06 ENCOUNTER — Other Ambulatory Visit: Payer: Self-pay | Admitting: Oncology

## 2022-09-06 DIAGNOSIS — C20 Malignant neoplasm of rectum: Secondary | ICD-10-CM

## 2022-09-07 ENCOUNTER — Inpatient Hospital Stay: Payer: No Typology Code available for payment source

## 2022-09-07 ENCOUNTER — Encounter: Payer: Self-pay | Admitting: *Deleted

## 2022-09-07 ENCOUNTER — Inpatient Hospital Stay: Payer: No Typology Code available for payment source | Attending: Oncology | Admitting: Oncology

## 2022-09-07 ENCOUNTER — Encounter: Payer: Self-pay | Admitting: Oncology

## 2022-09-07 VITALS — BP 168/77 | HR 61

## 2022-09-07 DIAGNOSIS — R918 Other nonspecific abnormal finding of lung field: Secondary | ICD-10-CM | POA: Insufficient documentation

## 2022-09-07 DIAGNOSIS — Z5111 Encounter for antineoplastic chemotherapy: Secondary | ICD-10-CM | POA: Insufficient documentation

## 2022-09-07 DIAGNOSIS — R970 Elevated carcinoembryonic antigen [CEA]: Secondary | ICD-10-CM | POA: Diagnosis not present

## 2022-09-07 DIAGNOSIS — G479 Sleep disorder, unspecified: Secondary | ICD-10-CM | POA: Insufficient documentation

## 2022-09-07 DIAGNOSIS — N400 Enlarged prostate without lower urinary tract symptoms: Secondary | ICD-10-CM | POA: Insufficient documentation

## 2022-09-07 DIAGNOSIS — K760 Fatty (change of) liver, not elsewhere classified: Secondary | ICD-10-CM | POA: Diagnosis not present

## 2022-09-07 DIAGNOSIS — I2510 Atherosclerotic heart disease of native coronary artery without angina pectoris: Secondary | ICD-10-CM | POA: Diagnosis not present

## 2022-09-07 DIAGNOSIS — R309 Painful micturition, unspecified: Secondary | ICD-10-CM | POA: Diagnosis not present

## 2022-09-07 DIAGNOSIS — E785 Hyperlipidemia, unspecified: Secondary | ICD-10-CM | POA: Diagnosis not present

## 2022-09-07 DIAGNOSIS — K5730 Diverticulosis of large intestine without perforation or abscess without bleeding: Secondary | ICD-10-CM | POA: Insufficient documentation

## 2022-09-07 DIAGNOSIS — F4310 Post-traumatic stress disorder, unspecified: Secondary | ICD-10-CM | POA: Insufficient documentation

## 2022-09-07 DIAGNOSIS — C20 Malignant neoplasm of rectum: Secondary | ICD-10-CM

## 2022-09-07 DIAGNOSIS — R5383 Other fatigue: Secondary | ICD-10-CM | POA: Insufficient documentation

## 2022-09-07 DIAGNOSIS — I10 Essential (primary) hypertension: Secondary | ICD-10-CM | POA: Insufficient documentation

## 2022-09-07 LAB — CMP (CANCER CENTER ONLY)
ALT: 20 U/L (ref 0–44)
AST: 16 U/L (ref 15–41)
Albumin: 4.1 g/dL (ref 3.5–5.0)
Alkaline Phosphatase: 71 U/L (ref 38–126)
Anion gap: 6 (ref 5–15)
BUN: 11 mg/dL (ref 8–23)
CO2: 26 mmol/L (ref 22–32)
Calcium: 8.4 mg/dL — ABNORMAL LOW (ref 8.9–10.3)
Chloride: 102 mmol/L (ref 98–111)
Creatinine: 0.94 mg/dL (ref 0.61–1.24)
GFR, Estimated: >60 mL/min (ref >60)
Glucose, Bld: 123 mg/dL — ABNORMAL HIGH (ref 70–99)
Potassium: 3.8 mmol/L (ref 3.5–5.1)
Sodium: 134 mmol/L — ABNORMAL LOW (ref 135–145)
Total Bilirubin: 0.4 mg/dL (ref 0.3–1.2)
Total Protein: 6.2 g/dL — ABNORMAL LOW (ref 6.5–8.1)

## 2022-09-07 LAB — CBC WITH DIFFERENTIAL (CANCER CENTER ONLY)
Abs Immature Granulocytes: 0.01 10*3/uL (ref 0.00–0.07)
Basophils Absolute: 0 10*3/uL (ref 0.0–0.1)
Basophils Relative: 0 %
Eosinophils Absolute: 0.1 10*3/uL (ref 0.0–0.5)
Eosinophils Relative: 3 %
HCT: 31.7 % — ABNORMAL LOW (ref 39.0–52.0)
Hemoglobin: 11.1 g/dL — ABNORMAL LOW (ref 13.0–17.0)
Immature Granulocytes: 0 %
Lymphocytes Relative: 42 %
Lymphs Abs: 1.8 10*3/uL (ref 0.7–4.0)
MCH: 32.6 pg (ref 26.0–34.0)
MCHC: 35 g/dL (ref 30.0–36.0)
MCV: 93.2 fL (ref 80.0–100.0)
Monocytes Absolute: 0.4 10*3/uL (ref 0.1–1.0)
Monocytes Relative: 9 %
Neutro Abs: 2 10*3/uL (ref 1.7–7.7)
Neutrophils Relative %: 46 %
Platelet Count: 114 10*3/uL — ABNORMAL LOW (ref 150–400)
RBC: 3.4 MIL/uL — ABNORMAL LOW (ref 4.22–5.81)
RDW: 12.5 % (ref 11.5–15.5)
WBC Count: 4.3 10*3/uL (ref 4.0–10.5)
nRBC: 0 % (ref 0.0–0.2)

## 2022-09-07 MED ORDER — SODIUM CHLORIDE 0.9 % IV SOLN
2400.0000 mg/m2 | INTRAVENOUS | Status: DC
  Administered 2022-09-07: 5000 mg via INTRAVENOUS
  Filled 2022-09-07: qty 100

## 2022-09-07 MED ORDER — OXALIPLATIN CHEMO INJECTION 100 MG/20ML
85.0000 mg/m2 | Freq: Once | INTRAVENOUS | Status: AC
  Administered 2022-09-07: 165 mg via INTRAVENOUS
  Filled 2022-09-07: qty 33

## 2022-09-07 MED ORDER — SODIUM CHLORIDE 0.9 % IV SOLN
10.0000 mg | Freq: Once | INTRAVENOUS | Status: AC
  Administered 2022-09-07: 10 mg via INTRAVENOUS
  Filled 2022-09-07: qty 10

## 2022-09-07 MED ORDER — PALONOSETRON HCL INJECTION 0.25 MG/5ML
0.2500 mg | Freq: Once | INTRAVENOUS | Status: AC
  Administered 2022-09-07: 0.25 mg via INTRAVENOUS
  Filled 2022-09-07: qty 5

## 2022-09-07 MED ORDER — DEXTROSE 5 % IV SOLN: Freq: Once | INTRAVENOUS | Status: AC

## 2022-09-07 MED ORDER — LEUCOVORIN CALCIUM INJECTION 350 MG
400.0000 mg/m2 | Freq: Once | INTRAVENOUS | Status: AC
  Administered 2022-09-07: 788 mg via INTRAVENOUS
  Filled 2022-09-07: qty 39.4

## 2022-09-07 MED ORDER — FLUOROURACIL CHEMO INJECTION 2.5 GM/50ML
400.0000 mg/m2 | Freq: Once | INTRAVENOUS | Status: AC
  Administered 2022-09-07: 800 mg via INTRAVENOUS
  Filled 2022-09-07: qty 16

## 2022-09-07 NOTE — Progress Notes (Signed)
  [facility] OFFICE PROGRESS NOTE   Diagnosis: Rectal cancer  INTERVAL HISTORY:   Ronald [name] completed a cycle of FOLFOX on 08/22/2022.  No nausea/vomiting, mouth sores, or diarrhea.  He is having a bowel movement daily.  He continues laxatives.  No neuropathy symptoms.  Objective:  Vital signs in last 24 hours:  Blood pressure 103/60, pulse 76, temperature 98.1 F (36.7 C), temperature source Oral, resp. rate 20, height 6' (1.829 m), weight 167 lb 3.2 oz (75.8 kg), SpO2 100 %.    HEENT: No thrush or ulcers Resp: Lungs clear bilaterally Cardio: Regular rate and rhythm GI: No hepatosplenomegaly, nontender Vascular: No leg edema  Skin: Palms without erythema  Portacath/PICC-without erythema  Lab Results:  Lab Results  Component Value Date   WBC 4.3 09/07/2022   HGB 11.1 (L) 09/07/2022   HCT 31.7 (L) 09/07/2022   MCV 93.2 09/07/2022   PLT 114 (L) 09/07/2022   NEUTROABS 2.0 09/07/2022    CMP  Lab Results  Component Value Date   NA 134 (L) 09/07/2022   K 3.8 09/07/2022   CL 102 09/07/2022   CO2 26 09/07/2022   GLUCOSE 123 (H) 09/07/2022   BUN 11 09/07/2022   CREATININE 0.94 09/07/2022   CALCIUM 8.4 (L) 09/07/2022   PROT 6.2 (L) 09/07/2022   ALBUMIN 4.1 09/07/2022   AST 16 09/07/2022   ALT 20 09/07/2022   ALKPHOS 71 09/07/2022   BILITOT 0.4 09/07/2022   GFRNONAA >60 09/07/2022   GFRAA 84 02/10/2019    Lab Results  Component Value Date   CEA1 23.9 (H) 07/24/2022   CEA 9.36 (H) 08/19/2022      Medications: I have reviewed the patient's current medications.   Assessment/Plan: Rectal cancer Colonoscopy 07/24/2022-mass in the posterior rectum extending from the dentate line for 3 cm, biopsy-moderately differentiated invasive adenocarcinoma, mismatch repair protein expression intact CT abdomen/pelvis 04/07/2022-cardiac enlargement, hepatic steatosis mild left adrenal nodularity stable compared to chest CT March 2020, small nonobstructing  bilateral renal calculi, extensive colonic diverticula CT chest 06/03/2022-6 mm nodule in the superior right lower lobe-new from 07/12/2018, new 1-2 mm nodule within the left upper lobe Elevated CEA 07/24/2022 MRI pelvis 08/06/2022-tumor at 3.5 cm from the anal verge with involvement of the upper anal sphincter, invasion of the inferior right levator ani muscle, T4b, one 5 mm left posterior perirectal node-in 1 CTs 08/19/2022-partially circumferential mass of the low rectum.  Small bilateral pulmonary nodules slightly enlarged compared to prior examination. 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  He will complete cycle 2 today.  The platelets are mildly low.  He will return for an office visit and chemotherapy in 2 weeks.  We will decrease the oxaliplatin dose if the platelets are lower when he returns in 2 weeks.  Ronald Papas, MD  09/07/2022  12:09 PM

## 2022-09-07 NOTE — Progress Notes (Signed)
Patient seen by Dr. Sherrill today ? ?Vitals are within treatment parameters. ? ?Labs reviewed by Dr. Sherrill and are within treatment parameters. ? ?Per physician team, patient is ready for treatment and there are NO modifications to the treatment plan.  ?

## 2022-09-07 NOTE — Patient Instructions (Addendum)
[place] [facility]   The chemotherapy medication bag should finish at 46 hours, 96 hours, or 7 days. For example, if your pump is scheduled for 46 hours and it was put on at 4:00 p.m., it should finish at 2:00 p.m. the day it is scheduled to come off regardless of your appointment time.     Estimated time to finish at 2:00 Wednesday, Sep 09, 2022.  If the display on your pump reads "Low Volume" and it is beeping, take the batteries out of the pump and come to the cancer center for it to be taken off.   If the pump alarms go off prior to the pump reading "Low Volume" then call [phone] and someone can assist you.  If the plunger comes out and the chemotherapy medication is leaking out, please use your home chemo spill kit to clean up the spill. Do NOT use paper towels or other household products.  If you have problems or questions regarding your pump, please call either [phone] (24 hours a day) or the cancer center Monday-Friday 8:00 a.m.- 4:30 p.m. at the clinic number and we will assist you. If you are unable to get assistance, then go to the nearest Emergency Department and ask the staff to contact the IV team for assistance.  Discharge Instructions: Thank you for choosing [facility] to provide your oncology and hematology care.   If you have a lab appointment with the Cancer Center, please go directly to the Cancer Center and check in at the registration area.   Wear comfortable clothing and clothing appropriate for easy access to any Portacath or PICC line.   We strive to give you quality time with your provider. You may need to reschedule your appointment if you arrive late (15 or more minutes).  Arriving late affects you and other patients whose appointments are after yours.  Also, if you miss three or more appointments without notifying the office, you may be dismissed from the clinic at the provider's discretion.      For  prescription refill requests, have your pharmacy contact our office and allow 72 hours for refills to be completed.    Today you received the following chemotherapy and/or immunotherapy agents Oxaliplatin, Leucovorin, Fluorouracil.      To help prevent nausea and vomiting after your treatment, we encourage you to take your nausea medication as directed.  BELOW ARE SYMPTOMS THAT SHOULD BE REPORTED IMMEDIATELY: *FEVER GREATER THAN 100.4 F (38 C) OR HIGHER *CHILLS OR SWEATING *NAUSEA AND VOMITING THAT IS NOT CONTROLLED WITH YOUR NAUSEA MEDICATION *UNUSUAL SHORTNESS OF BREATH *UNUSUAL BRUISING OR BLEEDING *URINARY PROBLEMS (pain or burning when urinating, or frequent urination) *BOWEL PROBLEMS (unusual diarrhea, constipation, pain near the anus) TENDERNESS IN MOUTH AND THROAT WITH OR WITHOUT PRESENCE OF ULCERS (sore throat, sores in mouth, or a toothache) UNUSUAL RASH, SWELLING OR PAIN  UNUSUAL VAGINAL DISCHARGE OR ITCHING   Items with * indicate a potential emergency and should be followed up as soon as possible or go to the Emergency Department if any problems should occur.  Please show the CHEMOTHERAPY ALERT CARD or IMMUNOTHERAPY ALERT CARD at check-in to the Emergency Department and triage nurse.  Should you have questions after your visit or need to cancel or reschedule your appointment, please contact  CANCER CENTER AT [facility]  Dept: [phone]  and follow the prompts.  Office hours are 8:00 a.m. to 4:30 p.m. Monday - Friday. Please note that  voicemails left after 4:00 p.m. may not be returned until the following business day.  We are closed weekends and major holidays. You have access to a nurse at all times for urgent questions. Please call the main number to the clinic Dept: [phone] and follow the prompts.   For any non-urgent questions, you may also contact your provider using MyChart. We now offer e-Visits for anyone 43 and older to request care online  for non-urgent symptoms. For details visit [email or web address].   Also download the MyChart app! Go to the app store, search "MyChart", open the app, select [place], and log in with your MyChart username and password.  Oxaliplatin Injection What is this medication? OXALIPLATIN (ox AL i PLA tin) treats colorectal cancer. It works by slowing down the growth of cancer cells. This medicine may be used for other purposes; ask your health care provider or pharmacist if you have questions. COMMON BRAND NAME(S): Eloxatin What should I tell my care team before I take this medication? They need to know if you have any of these conditions: Heart disease History of irregular heartbeat or rhythm Liver disease Low blood cell levels (white cells, red cells, and platelets) Lung or breathing disease, such as asthma Take medications that treat or prevent blood clots Tingling of the fingers, toes, or other nerve disorder An unusual or allergic reaction to oxaliplatin, other medications, foods, dyes, or preservatives If you or your partner are pregnant or trying to get pregnant Breast-feeding How should I use this medication? This medication is injected into a vein. It is given by your care team in a hospital or clinic setting. Talk to your care team about the use of this medication in children. Special care may be needed. Overdosage: If you think you have taken too much of this medicine contact a poison control center or emergency room at once. NOTE: This medicine is only for you. Do not share this medicine with others. What if I miss a dose? Keep appointments for follow-up doses. It is important not to miss a dose. Call your care team if you are unable to keep an appointment. What may interact with this medication? Do not take this medication with any of the following: Cisapride Dronedarone Pimozide Thioridazine This medication may also interact with the following: Aspirin and aspirin-like  medications Certain medications that treat or prevent blood clots, such as warfarin, apixaban, dabigatran, and rivaroxaban Cisplatin Cyclosporine Diuretics Medications for infection, such as acyclovir, adefovir, amphotericin B, bacitracin, cidofovir, foscarnet, ganciclovir, gentamicin, pentamidine, vancomycin NSAIDs, medications for pain and inflammation, such as ibuprofen or naproxen Other medications that cause heart rhythm changes Pamidronate Zoledronic acid This list may not describe all possible interactions. Give your health care provider a list of all the medicines, herbs, non-prescription drugs, or dietary supplements you use. Also tell them if you smoke, drink alcohol, or use illegal drugs. Some items may interact with your medicine. What should I watch for while using this medication? Your condition will be monitored carefully while you are receiving this medication. You may need blood work while taking this medication. This medication may make you feel generally unwell. This is not uncommon as chemotherapy can affect healthy cells as well as cancer cells. Report any side effects. Continue your course of treatment even though you feel ill unless your care team tells you to stop. This medication may increase your risk of getting an infection. Call your care team for advice if you get a fever, chills, sore throat, or other  symptoms of a cold or flu. Do not treat yourself. Try to avoid being around people who are sick. Avoid taking medications that contain aspirin, acetaminophen, ibuprofen, naproxen, or ketoprofen unless instructed by your care team. These medications may hide a fever. Be careful brushing or flossing your teeth or using a toothpick because you may get an infection or bleed more easily. If you have any dental work done, tell your dentist you are receiving this medication. This medication can make you more sensitive to cold. Do not drink cold drinks or use ice. Cover exposed  skin before coming in contact with cold temperatures or cold objects. When out in cold weather wear warm clothing and cover your mouth and nose to warm the air that goes into your lungs. Tell your care team if you get sensitive to the cold. Talk to your care team if you or your partner are pregnant or think either of you might be pregnant. This medication can cause serious birth defects if taken during pregnancy and for 9 months after the last dose. A negative pregnancy test is required before starting this medication. A reliable form of contraception is recommended while taking this medication and for 9 months after the last dose. Talk to your care team about effective forms of contraception. Do not father a child while taking this medication and for 6 months after the last dose. Use a condom while having sex during this time period. Do not breastfeed while taking this medication and for 3 months after the last dose. This medication may cause infertility. Talk to your care team if you are concerned about your fertility. What side effects may I notice from receiving this medication? Side effects that you should report to your care team as soon as possible: Allergic reactions--skin rash, itching, hives, swelling of the face, lips, tongue, or throat Bleeding--bloody or black, tar-like stools, vomiting blood or brown material that looks like coffee grounds, red or dark brown urine, small red or purple spots on skin, unusual bruising or bleeding Dry cough, shortness of breath or trouble breathing Heart rhythm changes--fast or irregular heartbeat, dizziness, feeling faint or lightheaded, chest pain, trouble breathing Infection--fever, chills, cough, sore throat, wounds that don't heal, pain or trouble when passing urine, general feeling of discomfort or being unwell Liver injury--right upper belly pain, loss of appetite, nausea, light-colored stool, dark yellow or brown urine, yellowing skin or eyes, unusual  weakness or fatigue Low red blood cell level--unusual weakness or fatigue, dizziness, headache, trouble breathing Muscle injury--unusual weakness or fatigue, muscle pain, dark yellow or brown urine, decrease in amount of urine Pain, tingling, or numbness in the hands or feet Sudden and severe headache, confusion, change in vision, seizures, which may be signs of posterior reversible encephalopathy syndrome (PRES) Unusual bruising or bleeding Side effects that usually do not require medical attention (report to your care team if they continue or are bothersome): Diarrhea Nausea Pain, redness, or swelling with sores inside the mouth or throat Unusual weakness or fatigue Vomiting This list may not describe all possible side effects. Call your doctor for medical advice about side effects. You may report side effects to FDA at 1-800-FDA-1088. Where should I keep my medication? This medication is given in a hospital or clinic. It will not be stored at home. NOTE: This sheet is a summary. It may not cover all possible information. If you have questions about this medicine, talk to your doctor, pharmacist, or health care provider.  2023 Elsevier/Gold Standard (2007-06-11  00:00:00) Leucovorin Injection What is this medication? LEUCOVORIN (loo koe VOR in) prevents side effects from certain medications, such as methotrexate. It works by increasing folate levels. This helps protect healthy cells in your body. It may also be used to treat anemia caused by low levels of folate. It can also be used with fluorouracil, a type of chemotherapy, to treat colorectal cancer. It works by increasing the effects of fluorouracil in the body. This medicine may be used for other purposes; ask your health care provider or pharmacist if you have questions. What should I tell my care team before I take this medication? They need to know if you have any of these conditions: Anemia from low levels of vitamin B12 in the  blood An unusual or allergic reaction to leucovorin, folic acid, other medications, foods, dyes, or preservatives Pregnant or trying to get pregnant Breastfeeding How should I use this medication? This medication is injected into a vein or a muscle. It is given by your care team in a hospital or clinic setting. Talk to your care team about the use of this medication in children. Special care may be needed. Overdosage: If you think you have taken too much of this medicine contact a poison control center or emergency room at once. NOTE: This medicine is only for you. Do not share this medicine with others. What if I miss a dose? Keep appointments for follow-up doses. It is important not to miss your dose. Call your care team if you are unable to keep an appointment. What may interact with this medication? Capecitabine Fluorouracil Phenobarbital Phenytoin Primidone Trimethoprim;sulfamethoxazole This list may not describe all possible interactions. Give your health care provider a list of all the medicines, herbs, non-prescription drugs, or dietary supplements you use. Also tell them if you smoke, drink alcohol, or use illegal drugs. Some items may interact with your medicine. What should I watch for while using this medication? Your condition will be monitored carefully while you are receiving this medication. This medication may increase the side effects of 5-fluorouracil. Tell your care team if you have diarrhea or mouth sores that do not get better or that get worse. What side effects may I notice from receiving this medication? Side effects that you should report to your care team as soon as possible: Allergic reactions--skin rash, itching, hives, swelling of the face, lips, tongue, or throat This list may not describe all possible side effects. Call your doctor for medical advice about side effects. You may report side effects to FDA at 1-800-FDA-1088. Where should I keep my  medication? This medication is given in a hospital or clinic. It will not be stored at home. NOTE: This sheet is a summary. It may not cover all possible information. If you have questions about this medicine, talk to your doctor, pharmacist, or health care provider.  2023 Elsevier/Gold Standard (2021-08-29 00:00:00) Fluorouracil Injection What is this medication? FLUOROURACIL (flure oh YOOR a sil) treats some types of cancer. It works by slowing down the growth of cancer cells. This medicine may be used for other purposes; ask your health care provider or pharmacist if you have questions. COMMON BRAND NAME(S): Adrucil What should I tell my care team before I take this medication? They need to know if you have any of these conditions: Blood disorders Dihydropyrimidine dehydrogenase (DPD) deficiency Infection, such as chickenpox, cold sores, herpes Kidney disease Liver disease Poor nutrition Recent or ongoing radiation therapy An unusual or allergic reaction to fluorouracil, other medications,  foods, dyes, or preservatives If you or your partner are pregnant or trying to get pregnant Breast-feeding How should I use this medication? This medication is injected into a vein. It is administered by your care team in a hospital or clinic setting. Talk to your care team about the use of this medication in children. Special care may be needed. Overdosage: If you think you have taken too much of this medicine contact a poison control center or emergency room at once. NOTE: This medicine is only for you. Do not share this medicine with others. What if I miss a dose? Keep appointments for follow-up doses. It is important not to miss your dose. Call your care team if you are unable to keep an appointment. What may interact with this medication? Do not take this medication with any of the following: Live virus vaccines This medication may also interact with the following: Medications that treat or  prevent blood clots, such as warfarin, enoxaparin, dalteparin This list may not describe all possible interactions. Give your health care provider a list of all the medicines, herbs, non-prescription drugs, or dietary supplements you use. Also tell them if you smoke, drink alcohol, or use illegal drugs. Some items may interact with your medicine. What should I watch for while using this medication? Your condition will be monitored carefully while you are receiving this medication. This medication may make you feel generally unwell. This is not uncommon as chemotherapy can affect healthy cells as well as cancer cells. Report any side effects. Continue your course of treatment even though you feel ill unless your care team tells you to stop. In some cases, you may be given additional medications to help with side effects. Follow all directions for their use. This medication may increase your risk of getting an infection. Call your care team for advice if you get a fever, chills, sore throat, or other symptoms of a cold or flu. Do not treat yourself. Try to avoid being around people who are sick. This medication may increase your risk to bruise or bleed. Call your care team if you notice any unusual bleeding. Be careful brushing or flossing your teeth or using a toothpick because you may get an infection or bleed more easily. If you have any dental work done, tell your dentist you are receiving this medication. Avoid taking medications that contain aspirin, acetaminophen, ibuprofen, naproxen, or ketoprofen unless instructed by your care team. These medications may hide a fever. Do not treat diarrhea with over the counter products. Contact your care team if you have diarrhea that lasts more than 2 days or if it is severe and watery. This medication can make you more sensitive to the sun. Keep out of the sun. If you cannot avoid being in the sun, wear protective clothing and sunscreen. Do not use sun lamps,  tanning beds, or tanning booths. Talk to your care team if you or your partner wish to become pregnant or think you might be pregnant. This medication can cause serious birth defects if taken during pregnancy and for 3 months after the last dose. A reliable form of contraception is recommended while taking this medication and for 3 months after the last dose. Talk to your care team about effective forms of contraception. Do not father a child while taking this medication and for 3 months after the last dose. Use a condom while having sex during this time period. Do not breastfeed while taking this medication. This medication may cause infertility.  Talk to your care team if you are concerned about your fertility. What side effects may I notice from receiving this medication? Side effects that you should report to your care team as soon as possible: Allergic reactions--skin rash, itching, hives, swelling of the face, lips, tongue, or throat Heart attack--pain or tightness in the chest, shoulders, arms, or jaw, nausea, shortness of breath, cold or clammy skin, feeling faint or lightheaded Heart failure--shortness of breath, swelling of the ankles, feet, or hands, sudden weight gain, unusual weakness or fatigue Heart rhythm changes--fast or irregular heartbeat, dizziness, feeling faint or lightheaded, chest pain, trouble breathing High ammonia level--unusual weakness or fatigue, confusion, loss of appetite, nausea, vomiting, seizures Infection--fever, chills, cough, sore throat, wounds that don't heal, pain or trouble when passing urine, general feeling of discomfort or being unwell Low red blood cell level--unusual weakness or fatigue, dizziness, headache, trouble breathing Pain, tingling, or numbness in the hands or feet, muscle weakness, change in vision, confusion or trouble speaking, loss of balance or coordination, trouble walking, seizures Redness, swelling, and blistering of the skin over hands and  feet Severe or prolonged diarrhea Unusual bruising or bleeding Side effects that usually do not require medical attention (report to your care team if they continue or are bothersome): Dry skin Headache Increased tears Nausea Pain, redness, or swelling with sores inside the mouth or throat Sensitivity to light Vomiting This list may not describe all possible side effects. Call your doctor for medical advice about side effects. You may report side effects to FDA at 1-800-FDA-1088. Where should I keep my medication? This medication is given in a hospital or clinic. It will not be stored at home. NOTE: This sheet is a summary. It may not cover all possible information. If you have questions about this medicine, talk to your doctor, pharmacist, or health care provider.  2023 Elsevier/Gold Standard (2021-08-19 00:00:00)

## 2022-09-09 ENCOUNTER — Inpatient Hospital Stay: Payer: No Typology Code available for payment source

## 2022-09-09 VITALS — BP 103/58 | HR 79 | Temp 97.6°F | Resp 20

## 2022-09-09 DIAGNOSIS — Z5111 Encounter for antineoplastic chemotherapy: Secondary | ICD-10-CM | POA: Diagnosis not present

## 2022-09-09 DIAGNOSIS — C20 Malignant neoplasm of rectum: Secondary | ICD-10-CM

## 2022-09-09 MED ORDER — HEPARIN SOD (PORK) LOCK FLUSH 100 UNIT/ML IV SOLN
500.0000 [IU] | Freq: Once | INTRAVENOUS | Status: AC | PRN
  Administered 2022-09-09: 500 [IU]

## 2022-09-09 MED ORDER — SODIUM CHLORIDE 0.9% FLUSH
10.0000 mL | INTRAVENOUS | Status: DC | PRN
  Administered 2022-09-09: 10 mL

## 2022-09-09 NOTE — Patient Instructions (Signed)

## 2022-09-14 NOTE — Progress Notes (Signed)
Radiation Oncology         [phone] ________________________________  Name: Ronald [name]        MRN: [id number]  Date of Service: 09/15/2022 DOB: 1945/07/09  WU:JWJX, Karen Chafe, MD  Ladene Artist, MD     REFERRING PHYSICIAN: Ladene Artist, MD   DIAGNOSIS: The encounter diagnosis was Rectal cancer [facility]).   HISTORY OF PRESENT ILLNESS: Ronald [name] is a 77 y.o. male seen at the request of Dr. Truett Perna for a new diagnosis of locally advanced rectal cancer. The patient apparently had a CT scan within the [place] without any known GI abnormalities identified, in July 2023 he had a myocardial infarction he was started on clopidogrel after placement of 2 stents with Dr. Nadara Eaton in cardiology. He had a CT of the chest at the [place] on 06/03/22 that showed a new 6 mm lung nodule in the RLL. He developed rectal bleeding after constipated bowel movements and was seen by Dr. Elnoria Howard.  He underwent colonoscopy on 07/24/2022, this showed a fungating infiltrative mass in the rectum partially circumferential measuring 3 cm in length.  This was noted to extend from the dentate line upward and was felt to be a distal tumor.  Pathology from his procedure confirmed invasive moderately differentiated adenocarcinoma.  His CEA at that time was elevated at 23.9.  He underwent a without contrast.  There was invasion of his tumor into the inferior right aspect of the levator ani muscle and abutted the right.  Mesorectal fascia anal sphincter.  His nodal disease was classified as N1 based on perirectal adenopathy but no extra mesorectal disease was identified.  He has met with Dr. Myrle Sheng who is recommending total neoadjuvant chemotherapy. He's also met with Dr. Cliffton Asters in Colorectal surgery. His treatment with FOLFOX began on 08/24/22, and to date he's received 3 of the 8 planned cycles.  He is seen today to discuss chemoradiation at the conclusion of total neoadjuvant chemotherapy.***    PREVIOUS RADIATION THERAPY:  No   PAST MEDICAL HISTORY:  Past Medical History:  Diagnosis Date   Bronchitis    Cancer (HCC)    colon   History of kidney stones    Hyperlipidemia    Hypertension    Myocardial infarction [facility])        PAST SURGICAL HISTORY: Past Surgical History:  Procedure Laterality Date   BIOPSY  07/24/2022   Procedure: BIOPSY;  Surgeon: Jeani Hawking, MD;  Location: [facility];  Service: Gastroenterology;;   COLONOSCOPY WITH PROPOFOL N/A 07/24/2022   Procedure: COLONOSCOPY WITH PROPOFOL;  Surgeon: Jeani Hawking, MD;  Location: [facility] ENDOSCOPY;  Service: Gastroenterology;  Laterality: N/A;   CORONARY STENT INTERVENTION N/A 12/23/2021   Procedure: CORONARY STENT INTERVENTION;  Surgeon: [name], MD;  Location: [facility] INVASIVE CV LAB;  Service: Cardiovascular;  Laterality: N/A;   LEFT HEART CATH AND CORONARY ANGIOGRAPHY N/A 11/19/2021   Procedure: LEFT HEART CATH AND CORONARY ANGIOGRAPHY;  Surgeon: Elder Negus, MD;  Location: [facility] INVASIVE CV LAB;  Service: Cardiovascular;  Laterality: N/A;   LEFT HEART CATHETERIZATION WITH CORONARY ANGIOGRAM N/A 01/15/2014   Procedure: LEFT HEART CATHETERIZATION WITH CORONARY ANGIOGRAM;  Surgeon: Micheline Chapman, MD;  Location: [facility] CATH LAB;  Service: Cardiovascular;  Laterality: N/A;   PORTACATH PLACEMENT N/A 08/18/2022   Procedure: INSERTION PORT-A-CATH;  Surgeon: Andria Meuse, MD;  Location: [facility] ORS;  Service: General;  Laterality: N/A;  60     FAMILY HISTORY: No family history on file.  SOCIAL HISTORY:  reports that he has been smoking cigars. He has never used smokeless tobacco. He reports current alcohol use. He reports that he does not use drugs.   ALLERGIES: Angiotensin receptor blockers, Beta adrenergic blockers, and Zestril [lisinopril]   MEDICATIONS:  Current Outpatient Medications  Medication Sig Dispense Refill   amLODipine (NORVASC) 5 MG tablet Take 1 tablet (5 mg total) by mouth daily at 10 pm. 30 tablet 3   aspirin 81 MG EC  tablet Take 1 tablet (81 mg total) by mouth daily. 30 tablet 3   atorvastatin (LIPITOR) 80 MG tablet Take 1 tablet (80 mg total) by mouth at bedtime. 90 tablet 3   cetirizine (ZYRTEC) 10 MG tablet Take 10 mg by mouth daily as needed for allergies.     cloNIDine (CATAPRES) 0.2 MG tablet Take 1 tablet (0.2 mg total) by mouth 2 (two) times daily. 120 tablet 1   diphenhydramine-acetaminophen (TYLENOL PM) 25-500 MG TABS tablet Take 2 tablets by mouth at bedtime.     ezetimibe (ZETIA) 10 MG tablet Take 1 tablet (10 mg total) by mouth daily. 100 tablet 3   isosorbide mononitrate (IMDUR) 30 MG 24 hr tablet Take 1 tablet (30 mg total) by mouth every morning. 90 tablet 1   lidocaine-prilocaine (EMLA) cream Apply 1 Application topically as needed (Use as directed to apply to port area 1-2 hours prior to appt). 30 g 0   MAGNESIUM PO Take 1 tablet by mouth at bedtime.     metoprolol succinate (TOPROL-XL) 25 MG 24 hr tablet TAKE 1 TABLET BY MOUTH DAILY (Patient taking differently: Take 25 mg by mouth daily with lunch.) 30 tablet 1   nitroGLYCERIN (NITROSTAT) 0.4 MG SL tablet Place 1 tablet (0.4 mg total) under the tongue every 5 (five) minutes x 3 doses as needed for chest pain. (Patient not taking: Reported on 08/05/2022) 25 tablet 2   Omega-3 Fatty Acids (FISH OIL PO) Take 1 capsule by mouth daily.     omeprazole (PRILOSEC OTC) 20 MG tablet Take 1 tablet (20 mg total) by mouth daily. 30 tablet 3   ondansetron (ZOFRAN) 8 MG tablet Take 1 tablet (8 mg total) by mouth every 8 (eight) hours as needed (May use as needed for nausea 3 days after chemo). 20 tablet 0   OVER THE COUNTER MEDICATION Take 5 tablets by mouth daily with breakfast. Immuno 150 supplement     polyethylene glycol (MIRALAX / GLYCOLAX) 17 g packet Take 17 g by mouth 2 (two) times daily.     Probiotic Product (RESTORA) CAPS Take 1 capsule by mouth daily.     prochlorperazine (COMPAZINE) 10 MG tablet Take 1 tablet (10 mg total) by mouth every 6 (six)  hours as needed for nausea or vomiting. 30 tablet 0   tamsulosin (FLOMAX) 0.4 MG CAPS capsule Take 0.4 mg by mouth 2 (two) times daily.     TURMERIC PO Take 2 tablets by mouth daily.     No current facility-administered medications for this visit.     REVIEW OF SYSTEMS: On review of systems, the patient reports that ***       PHYSICAL EXAM:  Wt Readings from Last 3 Encounters:  09/07/22 167 lb 3.2 oz (75.8 kg)  08/24/22 167 lb (75.8 kg)  08/18/22 169 lb (76.7 kg)   Temp Readings from Last 3 Encounters:  09/09/22 97.6 F (36.4 C) (Temporal)  09/07/22 98.1 F (36.7 C) (Oral)  08/26/22 98.2 F (36.8 C) (Temporal)   BP  Readings from Last 3 Encounters:  09/09/22 (!) 103/58  09/07/22 (!) 168/77  09/07/22 103/60   Pulse Readings from Last 3 Encounters:  09/09/22 79  09/07/22 61  09/07/22 76    /10  In general this is a well appearing caucasian male in no acute distress. He's alert and oriented x4 and appropriate throughout the examination. Cardiopulmonary assessment is negative for acute distress and he exhibits normal effort.     ECOG = ***  0 - Asymptomatic (Fully active, able to carry on all predisease activities without restriction)  1 - Symptomatic but completely ambulatory (Restricted in physically strenuous activity but ambulatory and able to carry out work of a light or sedentary nature. For example, light housework, office work)  2 - Symptomatic, <50% in bed during the day (Ambulatory and capable of all self care but unable to carry out any work activities. Up and about more than 50% of waking hours)  3 - Symptomatic, >50% in bed, but not bedbound (Capable of only limited self-care, confined to bed or chair 50% or more of waking hours)  4 - Bedbound (Completely disabled. Cannot carry on any self-care. Totally confined to bed or chair)  5 - Death   Santiago Glad MM, Creech RH, Tormey DC, et al. [phone]). "Toxicity and response criteria of the [facility]  Group". Am. Evlyn Clines. Oncol. 5 (6): 649-55    LABORATORY DATA:  Lab Results  Component Value Date   WBC 4.3 09/07/2022   HGB 11.1 (L) 09/07/2022   HCT 31.7 (L) 09/07/2022   MCV 93.2 09/07/2022   PLT 114 (L) 09/07/2022   Lab Results  Component Value Date   NA 134 (L) 09/07/2022   K 3.8 09/07/2022   CL 102 09/07/2022   CO2 26 09/07/2022   Lab Results  Component Value Date   ALT 20 09/07/2022   AST 16 09/07/2022   ALKPHOS 71 09/07/2022   BILITOT 0.4 09/07/2022      RADIOGRAPHY: CT CHEST ABDOMEN PELVIS W CONTRAST  Result Date: 08/21/2022 CLINICAL DATA:  Rectal cancer restaging, rising CEA, prior to chemotherapy initiation * Tracking Code: BO * EXAM: CT CHEST, ABDOMEN, AND PELVIS WITH CONTRAST TECHNIQUE: Multidetector CT imaging of the chest, abdomen and pelvis was performed following the standard protocol during bolus administration of intravenous contrast. RADIATION DOSE REDUCTION: This exam was performed according to the departmental dose-optimization program which includes automated exposure control, adjustment of the mA and/or kV according to patient size and/or use of iterative reconstruction technique. CONTRAST:  80mL OMNIPAQUE IOHEXOL 300 MG/ML SOLN additional oral enteric contrast COMPARISON:  MR pelvis, 08/06/2022, CT chest, 06/03/1998 4, CT abdomen pelvis, 04/07/2022 FINDINGS: CT CHEST FINDINGS Cardiovascular: Right chest port catheter. Aortic atherosclerosis. 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 Mild cardiomegaly. Three-vessel coronary artery calcifications. No pericardial effusion. Mediastinum/Nodes: No enlarged mediastinal, hilar, or axillary lymph nodes. Thyroid gland, trachea, and esophagus demonstrate no significant findings. Lungs/Pleura: Small bilateral pulmonary nodules are slightly enlarged compared to prior examination, largest in the superior segment right lower lobe measuring 0.8 cm, previously 0.5 cm (series 5, image 66)  additional example nodule in the posterior left upper lobe measuring 0.4 cm, previously 0.2 cm (series 5, image 48). No pleural effusion or pneumothorax. Musculoskeletal: No chest wall abnormality. No acute osseous findings. CT ABDOMEN PELVIS FINDINGS Hepatobiliary: No solid liver abnormality is seen. Tiny gallstones. No gallbladder wall thickening, or biliary dilatation. Pancreas: Unremarkable. No pancreatic ductal dilatation or surrounding inflammatory changes. Spleen:  Normal in size without significant abnormality. Adrenals/Urinary Tract: Adrenal glands are unremarkable. Multiple small bilateral nonobstructive renal calculi. No ureteral calculi or hydronephrosis. Simple, benign exophytic renal cortical cyst of the inferior pole of the left kidney, for which no further follow-up or characterization is required. Bladder is unremarkable. Stomach/Bowel: Stomach is within normal limits. Appendix appears normal. Descending and sigmoid diverticulosis. Partially circumferential mass of the low rectum, better assessed by prior dedicated MR of the pelvis (series 3, image 119). Vascular/Lymphatic: Aortic atherosclerosis. No enlarged abdominal or pelvic lymph nodes. Reproductive: Prostatomegaly. Other: No abdominal wall hernia or abnormality. No ascites. Musculoskeletal: No acute osseous findings. IMPRESSION: 1. Partially circumferential mass of the low rectum, better assessed by prior dedicated MR of the pelvis and consistent with rectal malignancy. 2. Small bilateral pulmonary nodules are slightly enlarged compared to prior examination, consistent with worsened small pulmonary metastases. 3. No evidence of lymphadenopathy or metastatic disease in the abdomen or pelvis. 4. Cholelithiasis. 5. Bilateral nonobstructive nephrolithiasis. 6. Prostatomegaly. 7. Coronary artery disease. Aortic Atherosclerosis (ICD10-I70.0). Electronically Signed   By: Jearld Lesch M.D.   On: 08/21/2022 13:55   DG CHEST PORT 1 VIEW  Result  Date: 08/18/2022 CLINICAL DATA:  [id number] Port-A-Cath in place [id number]. EXAM: PORTABLE CHEST 1 VIEW COMPARISON:  11/19/2021. FINDINGS: 0849 hours. Interval placement of a right chest Port-A-Cath with tip projecting over the superior cavoatrial junction. No consolidation or edema. Stable cardiac and mediastinal contours. No pleural effusion or pneumothorax. IMPRESSION: Interval placement of a right chest Port-A-Cath with tip projecting over the superior cavoatrial junction. Electronically Signed   By: Orvan Falconer M.D.   On: 08/18/2022 08:56   DG C-Arm 1-60 Min-No Report  Result Date: 08/18/2022 Fluoroscopy was utilized by the requesting physician.  No radiographic interpretation.       IMPRESSION/PLAN: 1. Stage IIIC, [id number], Adenocarcinoma of the distal rectum. Dr. Mitzi Hansen discusses the pathology findings and reviews the nature of locally advanced rectal cancer. Recommendations are for total neoadjuvant chemotherapy followed by chemoRT, and then surgical resection. We discussed the risks, benefits, short, and long term effects of radiotherapy, as well as the curative intent, and the patient is interested in proceeding. Dr. Mitzi Hansen discusses the delivery and logistics of radiotherapy and anticipates a course of 5 1/2 weeks of radiotherapy. We will see him back in about 4 1/2 months to discuss proceeding with chemoRT.  2. RLL lung nodule. *** 3. Risks of pelvic floor dysfunction from radiotherapy. We discussed the importance of evaluation with physical therapy prior to pelvic radiation. A referral was placed to physical therapy today. **    In a visit lasting *** minutes, greater than 50% of the time was spent face to face discussing the patient's condition, in preparation for the discussion, and coordinating the patient's care.   The above documentation reflects my direct findings during this shared patient visit. Please see the separate note by Dr. Mitzi Hansen on this date for the remainder of the patient's  plan of care.    [name], [facility]   **Disclaimer: This note was dictated with voice recognition software. Similar sounding words can inadvertently be transcribed and this note may contain transcription errors which may not have been corrected upon publication of note.**

## 2022-09-14 NOTE — Progress Notes (Signed)
GI Location of Tumor / Histology: Rectal  Ronald [name] presented with complaints of rectal pain and bleeding.  Colonoscopy 07/24/2022: Fungating infiltrative mass in the rectum partially circumferential measuring 3 cm in length.  CT Chest 06/03/2022: 6 mm lung nodule in the right lower lobe lung.   Biopsies of   Past/Anticipated interventions by surgeon, if any:  Dr. Cliffton Asters 08/17/2022 --Could have small focuses of metastatic disease based on the new lesion seen on his chest CT. I will message Dr. Truett Perna and discuss potentially updating his staging imaging. -He is currently scheduled to start systemic treatment with planned TNT. -Spent time today discussing the relevant anatomy physiology the GI tract and pathophysiology of colorectal cancer. He is tentatively scheduled for a Port-A-Cath placement tomorrow.    Past/Anticipated interventions by medical oncology, if any:  Dr. Truett Perna 09/07/2022 -FOLFOX started 08/24/2022, 3/8 cycles completed.   Weight changes, if any:   Bowel/Bladder complaints, if any:   Nausea / Vomiting, if any:   Pain issues, if any:    Any blood per rectum:     SAFETY ISSUES: Prior radiation?  Pacemaker/ICD?  Possible current pregnancy? N/a Is the patient on methotrexate?   Current Complaints/Details:

## 2022-09-15 ENCOUNTER — Encounter: Payer: Self-pay | Admitting: Radiation Oncology

## 2022-09-15 ENCOUNTER — Ambulatory Visit
Admission: RE | Admit: 2022-09-15 | Discharge: 2022-09-15 | Disposition: A | Discharge status: Home / Self Care | Payer: No Typology Code available for payment source | Source: Ambulatory Visit | Attending: Radiation Oncology | Admitting: Radiation Oncology

## 2022-09-15 VITALS — BP 129/72 | HR 74 | Temp 98.3°F | Resp 20 | Wt 161.0 lb

## 2022-09-15 DIAGNOSIS — I2510 Atherosclerotic heart disease of native coronary artery without angina pectoris: Secondary | ICD-10-CM | POA: Insufficient documentation

## 2022-09-15 DIAGNOSIS — E785 Hyperlipidemia, unspecified: Secondary | ICD-10-CM | POA: Insufficient documentation

## 2022-09-15 DIAGNOSIS — F17290 Nicotine dependence, other tobacco product, uncomplicated: Secondary | ICD-10-CM | POA: Diagnosis not present

## 2022-09-15 DIAGNOSIS — Z87442 Personal history of urinary calculi: Secondary | ICD-10-CM | POA: Insufficient documentation

## 2022-09-15 DIAGNOSIS — I252 Old myocardial infarction: Secondary | ICD-10-CM | POA: Diagnosis not present

## 2022-09-15 DIAGNOSIS — R918 Other nonspecific abnormal finding of lung field: Secondary | ICD-10-CM | POA: Diagnosis not present

## 2022-09-15 DIAGNOSIS — C20 Malignant neoplasm of rectum: Secondary | ICD-10-CM

## 2022-09-15 DIAGNOSIS — N200 Calculus of kidney: Secondary | ICD-10-CM | POA: Diagnosis not present

## 2022-09-15 DIAGNOSIS — N400 Enlarged prostate without lower urinary tract symptoms: Secondary | ICD-10-CM | POA: Insufficient documentation

## 2022-09-15 DIAGNOSIS — K5730 Diverticulosis of large intestine without perforation or abscess without bleeding: Secondary | ICD-10-CM | POA: Diagnosis not present

## 2022-09-15 DIAGNOSIS — Z79899 Other long term (current) drug therapy: Secondary | ICD-10-CM | POA: Insufficient documentation

## 2022-09-15 DIAGNOSIS — I700 Atherosclerosis of aorta: Secondary | ICD-10-CM | POA: Insufficient documentation

## 2022-09-15 DIAGNOSIS — K8020 Calculus of gallbladder without cholecystitis without obstruction: Secondary | ICD-10-CM | POA: Insufficient documentation

## 2022-09-15 DIAGNOSIS — Z7982 Long term (current) use of aspirin: Secondary | ICD-10-CM | POA: Insufficient documentation

## 2022-09-15 DIAGNOSIS — I10 Essential (primary) hypertension: Secondary | ICD-10-CM | POA: Insufficient documentation

## 2022-09-21 ENCOUNTER — Inpatient Hospital Stay: Payer: No Typology Code available for payment source

## 2022-09-21 ENCOUNTER — Ambulatory Visit: Payer: Non-veteran care

## 2022-09-21 ENCOUNTER — Encounter: Payer: Self-pay | Admitting: Nurse Practitioner

## 2022-09-21 ENCOUNTER — Inpatient Hospital Stay (HOSPITAL_BASED_OUTPATIENT_CLINIC_OR_DEPARTMENT_OTHER): Payer: No Typology Code available for payment source | Admitting: Nurse Practitioner

## 2022-09-21 ENCOUNTER — Other Ambulatory Visit: Payer: Non-veteran care

## 2022-09-21 ENCOUNTER — Ambulatory Visit: Payer: Non-veteran care | Admitting: Nurse Practitioner

## 2022-09-21 VITALS — BP 134/68 | HR 87 | Temp 98.1°F | Resp 18 | Ht 72.0 in | Wt 164.4 lb

## 2022-09-21 DIAGNOSIS — C20 Malignant neoplasm of rectum: Secondary | ICD-10-CM | POA: Diagnosis not present

## 2022-09-21 DIAGNOSIS — Z5111 Encounter for antineoplastic chemotherapy: Secondary | ICD-10-CM | POA: Diagnosis not present

## 2022-09-21 DIAGNOSIS — Z95828 Presence of other vascular implants and grafts: Secondary | ICD-10-CM

## 2022-09-21 LAB — CMP (CANCER CENTER ONLY)
ALT: 15 U/L (ref 0–44)
AST: 13 U/L — ABNORMAL LOW (ref 15–41)
Albumin: 3.6 g/dL (ref 3.5–5.0)
Alkaline Phosphatase: 92 U/L (ref 38–126)
Anion gap: 6 (ref 5–15)
BUN: 14 mg/dL (ref 8–23)
CO2: 24 mmol/L (ref 22–32)
Calcium: 8 mg/dL — ABNORMAL LOW (ref 8.9–10.3)
Chloride: 102 mmol/L (ref 98–111)
Creatinine: 0.8 mg/dL (ref 0.61–1.24)
GFR, Estimated: >60 mL/min (ref >60)
Glucose, Bld: 196 mg/dL — ABNORMAL HIGH (ref 70–99)
Potassium: 3.5 mmol/L (ref 3.5–5.1)
Sodium: 132 mmol/L — ABNORMAL LOW (ref 135–145)
Total Bilirubin: 0.4 mg/dL (ref 0.3–1.2)
Total Protein: 5.6 g/dL — ABNORMAL LOW (ref 6.5–8.1)

## 2022-09-21 LAB — CBC WITH DIFFERENTIAL (CANCER CENTER ONLY)
Abs Immature Granulocytes: 0 10*3/uL (ref 0.00–0.07)
Basophils Absolute: 0 10*3/uL (ref 0.0–0.1)
Basophils Relative: 1 %
Eosinophils Absolute: 0 10*3/uL (ref 0.0–0.5)
Eosinophils Relative: 0 %
HCT: 28 % — ABNORMAL LOW (ref 39.0–52.0)
Hemoglobin: 9.9 g/dL — ABNORMAL LOW (ref 13.0–17.0)
Immature Granulocytes: 0 %
Lymphocytes Relative: 81 %
Lymphs Abs: 1.1 10*3/uL (ref 0.7–4.0)
MCH: 32.5 pg (ref 26.0–34.0)
MCHC: 35.4 g/dL (ref 30.0–36.0)
MCV: 91.8 fL (ref 80.0–100.0)
Monocytes Absolute: 0.2 10*3/uL (ref 0.1–1.0)
Monocytes Relative: 13 %
Neutro Abs: 0.1 10*3/uL — CL (ref 1.7–7.7)
Neutrophils Relative %: 5 %
Platelet Count: 71 10*3/uL — ABNORMAL LOW (ref 150–400)
RBC: 3.05 MIL/uL — ABNORMAL LOW (ref 4.22–5.81)
RDW: 12.7 % (ref 11.5–15.5)
WBC Count: 1.3 10*3/uL — ABNORMAL LOW (ref 4.0–10.5)
nRBC: 0 % (ref 0.0–0.2)

## 2022-09-21 MED ORDER — SODIUM CHLORIDE 0.9% FLUSH
10.0000 mL | INTRAVENOUS | Status: AC | PRN
  Administered 2022-09-21: 10 mL via INTRAVENOUS

## 2022-09-21 MED ORDER — ALPRAZOLAM 0.5 MG PO TABS
0.5000 mg | ORAL_TABLET | Freq: Every evening | ORAL | 0 refills | Status: DC | PRN
Start: 2022-09-21 — End: 2022-10-05

## 2022-09-21 MED ORDER — LEVOFLOXACIN 500 MG PO TABS
500.0000 mg | ORAL_TABLET | Freq: Every day | ORAL | 0 refills | Status: DC
Start: 2022-09-21 — End: 2022-10-05

## 2022-09-21 MED ORDER — HEPARIN SOD (PORK) LOCK FLUSH 100 UNIT/ML IV SOLN
500.0000 [IU] | Freq: Once | INTRAVENOUS | Status: AC
  Administered 2022-09-21: 500 [IU] via INTRAVENOUS

## 2022-09-21 NOTE — Progress Notes (Signed)
CRITICAL VALUE STICKER  CRITICAL VALUE: ANC 0.1  RECEIVER (on-site recipient of call): Charlann Noss, RN  DATE & TIME NOTIFIED: [phone]  MESSENGER (representative from lab): Maurine Minister  MD NOTIFIED: Lonna Cobb, NP   TIME OF NOTIFICATION: [phone]  RESPONSE: No new orders at this time

## 2022-09-21 NOTE — Addendum Note (Signed)
Addended by: Dimitri Ped on: 09/21/2022 09:50 AM   Modules accepted: Orders

## 2022-09-21 NOTE — Progress Notes (Signed)
Patient seen by Lonna Cobb NP today  Vitals are within treatment parameters.  Labs reviewed by Lonna Cobb NP and are not all within treatment parameters. Neut 1.0  Per physician team, patient will not be receiving treatment today.

## 2022-09-21 NOTE — Progress Notes (Signed)
  [facility] OFFICE PROGRESS NOTE   Diagnosis: Rectal cancer  INTERVAL HISTORY:   Ronald [name] returns as scheduled.  He completed cycle 2 FOLFOX 09/07/2022.  He denies nausea/vomiting.  No mouth sores.  No significant diarrhea.  Occasional pain with urination.  Main complaints are fatigue and difficulty sleeping.  He denies fever.  No shaking chills.  No bleeding.  Objective:  Vital signs in last 24 hours:  Blood pressure 134/68, pulse 87, temperature 98.1 F (36.7 C), temperature source Oral, resp. rate 18, height 6' (1.829 m), weight 164 lb 7.2 oz (74.6 kg), SpO2 100 %.    HEENT: No thrush or ulcers. Resp: Lungs clear bilaterally. Cardio: Regular rate and rhythm. GI: No hepatosplenomegaly. Vascular: No leg edema. Skin: Palms without erythema. Port-A-Cath without erythema.  Lab Results:  Lab Results  Component Value Date   WBC 1.3 (L) 09/21/2022   HGB 9.9 (L) 09/21/2022   HCT 28.0 (L) 09/21/2022   MCV 91.8 09/21/2022   PLT 71 (L) 09/21/2022   NEUTROABS PENDING 09/21/2022    Imaging:  No results found.  Medications: I have reviewed the patient's current medications.  Assessment/Plan: Rectal cancer Colonoscopy 07/24/2022-mass in the posterior rectum extending from the dentate line for 3 cm, biopsy-moderately differentiated invasive adenocarcinoma, mismatch repair protein expression intact CT abdomen/pelvis 04/07/2022-cardiac enlargement, hepatic steatosis mild left adrenal nodularity stable compared to chest CT March 2020, small nonobstructing bilateral renal calculi, extensive colonic diverticula CT chest 06/03/2022-6 mm nodule in the superior right lower lobe-new from 07/12/2018, new 1-2 mm nodule within the left upper lobe Elevated CEA 07/24/2022 MRI pelvis 08/06/2022-tumor at 3.5 cm from the anal verge with involvement of the upper anal sphincter, invasion of the inferior right levator ani muscle, T4b, one 5 mm left posterior perirectal node-in 1 CTs  08/19/2022-partially circumferential mass of the low rectum.  Small bilateral pulmonary nodules slightly enlarged compared to prior examination. Cycle 1 FOLFOX 08/24/2022 Cycle 2 FOLFOX 09/07/2022 Chemotherapy held 09/21/2022 due to severe neutropenia, thrombocytopenia CAD, status post myocardial infarction 2015 and July 2023, D1 branch LAD angioplasty 12/23/2021 Hyperlipidemia Hypertension PTSD BPH Cigar use  Disposition: Ronald [name] stable.  He has completed 2 cycles of FOLFOX.  On labs today he has severe neutropenia and thrombocytopenia.  We are holding cycle 3 FOLFOX.  He does not want to return for chemotherapy for 2 weeks.  Precautions reviewed.  He understands to contact the office with fever, chills, other signs of infection, bleeding.  He will begin Levaquin 500 mg daily.  We discussed the potential side effects of tendon rupture and neuropathy.  He agrees to proceed.  We discussed adding white cell growth factor support on the day of pump discontinuation beginning with the next treatment.  We reviewed potential side effects including bone pain, rash, splenic rupture.  He agrees with this plan.  He will return for a follow-up CBC 09/24/2022.  We will see him as scheduled in 2 weeks.  We are available to see him sooner if needed.    Blaine Mccalla ANP/GNP-BC   09/21/2022  9:21 AM

## 2022-09-23 ENCOUNTER — Inpatient Hospital Stay: Payer: No Typology Code available for payment source

## 2022-09-24 ENCOUNTER — Inpatient Hospital Stay: Payer: No Typology Code available for payment source

## 2022-09-24 DIAGNOSIS — Z5111 Encounter for antineoplastic chemotherapy: Secondary | ICD-10-CM | POA: Diagnosis not present

## 2022-09-24 DIAGNOSIS — Z95828 Presence of other vascular implants and grafts: Secondary | ICD-10-CM

## 2022-09-24 DIAGNOSIS — C20 Malignant neoplasm of rectum: Secondary | ICD-10-CM

## 2022-09-24 LAB — CBC WITH DIFFERENTIAL (CANCER CENTER ONLY)
Abs Immature Granulocytes: 0.43 10*3/uL — ABNORMAL HIGH (ref 0.00–0.07)
Basophils Absolute: 0 10*3/uL (ref 0.0–0.1)
Basophils Relative: 1 %
Eosinophils Absolute: 0 10*3/uL (ref 0.0–0.5)
Eosinophils Relative: 0 %
HCT: 28.1 % — ABNORMAL LOW (ref 39.0–52.0)
Hemoglobin: 9.9 g/dL — ABNORMAL LOW (ref 13.0–17.0)
Immature Granulocytes: 12 %
Lymphocytes Relative: 48 %
Lymphs Abs: 1.7 10*3/uL (ref 0.7–4.0)
MCH: 32.2 pg (ref 26.0–34.0)
MCHC: 35.2 g/dL (ref 30.0–36.0)
MCV: 91.5 fL (ref 80.0–100.0)
Monocytes Absolute: 0.7 10*3/uL (ref 0.1–1.0)
Monocytes Relative: 18 %
Neutro Abs: 0.8 10*3/uL — ABNORMAL LOW (ref 1.7–7.7)
Neutrophils Relative %: 21 %
Platelet Count: 160 10*3/uL (ref 150–400)
RBC: 3.07 MIL/uL — ABNORMAL LOW (ref 4.22–5.81)
RDW: 13.3 % (ref 11.5–15.5)
WBC Count: 3.6 10*3/uL — ABNORMAL LOW (ref 4.0–10.5)
nRBC: 0 % (ref 0.0–0.2)

## 2022-09-24 MED ORDER — SODIUM CHLORIDE 0.9% FLUSH: 10.0000 mL | INTRAVENOUS | Status: DC | PRN

## 2022-09-24 MED ORDER — HEPARIN SOD (PORK) LOCK FLUSH 100 UNIT/ML IV SOLN: 500.0000 [IU] | INTRAVENOUS | Status: DC | PRN

## 2022-09-24 NOTE — Patient Instructions (Signed)

## 2022-09-30 ENCOUNTER — Other Ambulatory Visit: Payer: Self-pay

## 2022-09-30 DIAGNOSIS — E782 Mixed hyperlipidemia: Secondary | ICD-10-CM

## 2022-09-30 MED ORDER — EZETIMIBE 10 MG PO TABS
10.0000 mg | ORAL_TABLET | Freq: Every day | ORAL | 3 refills | Status: DC
Start: 2022-09-30 — End: 2022-09-30

## 2022-09-30 MED ORDER — ATORVASTATIN CALCIUM 80 MG PO TABS: 80.0000 mg | ORAL_TABLET | Freq: Every day | ORAL | 3 refills | Status: AC

## 2022-09-30 MED ORDER — ATORVASTATIN CALCIUM 80 MG PO TABS: 80.0000 mg | ORAL_TABLET | Freq: Every day | ORAL | 3 refills | Status: DC

## 2022-09-30 MED ORDER — EZETIMIBE 10 MG PO TABS: 10.0000 mg | ORAL_TABLET | Freq: Every day | ORAL | 3 refills | Status: AC

## 2022-10-05 ENCOUNTER — Inpatient Hospital Stay (HOSPITAL_BASED_OUTPATIENT_CLINIC_OR_DEPARTMENT_OTHER): Payer: No Typology Code available for payment source | Admitting: Oncology

## 2022-10-05 ENCOUNTER — Inpatient Hospital Stay: Payer: No Typology Code available for payment source | Attending: Nurse Practitioner

## 2022-10-05 ENCOUNTER — Inpatient Hospital Stay: Payer: No Typology Code available for payment source

## 2022-10-05 VITALS — BP 151/83 | HR 68

## 2022-10-05 VITALS — BP 135/65 | HR 78 | Temp 98.2°F | Resp 18 | Ht 72.0 in | Wt 165.6 lb

## 2022-10-05 DIAGNOSIS — F4310 Post-traumatic stress disorder, unspecified: Secondary | ICD-10-CM | POA: Diagnosis not present

## 2022-10-05 DIAGNOSIS — E785 Hyperlipidemia, unspecified: Secondary | ICD-10-CM | POA: Insufficient documentation

## 2022-10-05 DIAGNOSIS — C20 Malignant neoplasm of rectum: Secondary | ICD-10-CM | POA: Diagnosis not present

## 2022-10-05 DIAGNOSIS — I10 Essential (primary) hypertension: Secondary | ICD-10-CM | POA: Diagnosis not present

## 2022-10-05 DIAGNOSIS — D696 Thrombocytopenia, unspecified: Secondary | ICD-10-CM | POA: Insufficient documentation

## 2022-10-05 DIAGNOSIS — R918 Other nonspecific abnormal finding of lung field: Secondary | ICD-10-CM | POA: Diagnosis not present

## 2022-10-05 DIAGNOSIS — I2510 Atherosclerotic heart disease of native coronary artery without angina pectoris: Secondary | ICD-10-CM | POA: Diagnosis not present

## 2022-10-05 DIAGNOSIS — Z5111 Encounter for antineoplastic chemotherapy: Secondary | ICD-10-CM | POA: Insufficient documentation

## 2022-10-05 DIAGNOSIS — D709 Neutropenia, unspecified: Secondary | ICD-10-CM | POA: Diagnosis not present

## 2022-10-05 DIAGNOSIS — N400 Enlarged prostate without lower urinary tract symptoms: Secondary | ICD-10-CM | POA: Diagnosis not present

## 2022-10-05 DIAGNOSIS — Z5189 Encounter for other specified aftercare: Secondary | ICD-10-CM | POA: Insufficient documentation

## 2022-10-05 LAB — CBC WITH DIFFERENTIAL (CANCER CENTER ONLY)
Abs Immature Granulocytes: 0.05 10*3/uL (ref 0.00–0.07)
Basophils Absolute: 0.1 10*3/uL (ref 0.0–0.1)
Basophils Relative: 1 %
Eosinophils Absolute: 0 10*3/uL (ref 0.0–0.5)
Eosinophils Relative: 1 %
HCT: 30.2 % — ABNORMAL LOW (ref 39.0–52.0)
Hemoglobin: 10.3 g/dL — ABNORMAL LOW (ref 13.0–17.0)
Immature Granulocytes: 1 %
Lymphocytes Relative: 24 %
Lymphs Abs: 1.6 10*3/uL (ref 0.7–4.0)
MCH: 32.2 pg (ref 26.0–34.0)
MCHC: 34.1 g/dL (ref 30.0–36.0)
MCV: 94.4 fL (ref 80.0–100.0)
Monocytes Absolute: 0.9 10*3/uL (ref 0.1–1.0)
Monocytes Relative: 15 %
Neutro Abs: 3.9 10*3/uL (ref 1.7–7.7)
Neutrophils Relative %: 58 %
Platelet Count: 214 10*3/uL (ref 150–400)
RBC: 3.2 MIL/uL — ABNORMAL LOW (ref 4.22–5.81)
RDW: 14.2 % (ref 11.5–15.5)
WBC Count: 6.5 10*3/uL (ref 4.0–10.5)
nRBC: 0 % (ref 0.0–0.2)

## 2022-10-05 LAB — CEA (ACCESS): CEA (CHCC): 3.17 ng/mL (ref 0.00–5.00)

## 2022-10-05 LAB — CMP (CANCER CENTER ONLY)
ALT: 13 U/L (ref 0–44)
AST: 17 U/L (ref 15–41)
Albumin: 3.8 g/dL (ref 3.5–5.0)
Alkaline Phosphatase: 78 U/L (ref 38–126)
Anion gap: 7 (ref 5–15)
BUN: 16 mg/dL (ref 8–23)
CO2: 24 mmol/L (ref 22–32)
Calcium: 8.4 mg/dL — ABNORMAL LOW (ref 8.9–10.3)
Chloride: 107 mmol/L (ref 98–111)
Creatinine: 0.81 mg/dL (ref 0.61–1.24)
GFR, Estimated: >60 mL/min (ref >60)
Glucose, Bld: 112 mg/dL — ABNORMAL HIGH (ref 70–99)
Potassium: 3.5 mmol/L (ref 3.5–5.1)
Sodium: 138 mmol/L (ref 135–145)
Total Bilirubin: 0.3 mg/dL (ref 0.3–1.2)
Total Protein: 6 g/dL — ABNORMAL LOW (ref 6.5–8.1)

## 2022-10-05 MED ORDER — FLUOROURACIL CHEMO INJECTION 2.5 GM/50ML
400.0000 mg/m2 | Freq: Once | INTRAVENOUS | Status: AC
  Administered 2022-10-05: 800 mg via INTRAVENOUS
  Filled 2022-10-05: qty 16

## 2022-10-05 MED ORDER — OXALIPLATIN CHEMO INJECTION 100 MG/20ML
65.0000 mg/m2 | Freq: Once | INTRAVENOUS | Status: AC
  Administered 2022-10-05: 130 mg via INTRAVENOUS
  Filled 2022-10-05: qty 10

## 2022-10-05 MED ORDER — LEUCOVORIN CALCIUM INJECTION 350 MG
400.0000 mg/m2 | Freq: Once | INTRAVENOUS | Status: AC
  Administered 2022-10-05: 788 mg via INTRAVENOUS
  Filled 2022-10-05: qty 39.4

## 2022-10-05 MED ORDER — SODIUM CHLORIDE 0.9 % IV SOLN
10.0000 mg | Freq: Once | INTRAVENOUS | Status: AC
  Administered 2022-10-05: 10 mg via INTRAVENOUS
  Filled 2022-10-05: qty 10

## 2022-10-05 MED ORDER — SODIUM CHLORIDE 0.9 % IV SOLN
2400.0000 mg/m2 | INTRAVENOUS | Status: DC
  Administered 2022-10-05: 5000 mg via INTRAVENOUS
  Filled 2022-10-05: qty 100

## 2022-10-05 MED ORDER — PALONOSETRON HCL INJECTION 0.25 MG/5ML
0.2500 mg | Freq: Once | INTRAVENOUS | Status: AC
  Administered 2022-10-05: 0.25 mg via INTRAVENOUS
  Filled 2022-10-05: qty 5

## 2022-10-05 MED ORDER — DEXTROSE 5 % IV SOLN: Freq: Once | INTRAVENOUS | Status: AC

## 2022-10-05 NOTE — Patient Instructions (Signed)

## 2022-10-05 NOTE — Progress Notes (Signed)
  [facility] OFFICE PROGRESS NOTE   Diagnosis: Rectal cancer  INTERVAL HISTORY:   Ronald [name] returns as scheduled.  He has no problem with bowel movements.  Chemotherapy was held 2 weeks ago due to neutropenia and thrombocytopenia.  He reports cold sensitivity lasting approximately 5 days following chemotherapy.  No neuropathy symptoms at present.  Objective:  Vital signs in last 24 hours:  Blood pressure 135/65, pulse 78, temperature 98.2 F (36.8 C), temperature source Oral, resp. rate 18, height 6' (1.829 m), weight 165 lb 9.6 oz (75.1 kg), SpO2 100 %.    HEENT: No thrush or ulcers Resp: Lungs clear bilaterally Cardio: Regular rate and rhythm GI: Nontender, no hepatosplenomegaly Vascular: No leg edema  Skin: Palms without erythema  Portacath/PICC-without erythema  Lab Results:  Lab Results  Component Value Date   WBC 6.5 10/05/2022   HGB 10.3 (L) 10/05/2022   HCT 30.2 (L) 10/05/2022   MCV 94.4 10/05/2022   PLT 214 10/05/2022   NEUTROABS 3.9 10/05/2022    CMP  Lab Results  Component Value Date   NA 138 10/05/2022   K 3.5 10/05/2022   CL 107 10/05/2022   CO2 24 10/05/2022   GLUCOSE 112 (H) 10/05/2022   BUN 16 10/05/2022   CREATININE 0.81 10/05/2022   CALCIUM 8.4 (L) 10/05/2022   PROT 6.0 (L) 10/05/2022   ALBUMIN 3.8 10/05/2022   AST 17 10/05/2022   ALT 13 10/05/2022   ALKPHOS 78 10/05/2022   BILITOT 0.3 10/05/2022   GFRNONAA >60 10/05/2022   GFRAA 84 02/10/2019    Lab Results  Component Value Date   CEA1 23.9 (H) 07/24/2022   CEA 9.36 (H) 08/19/2022    Medications: I have reviewed the patient's current medications.   Assessment/Plan: Rectal cancer Colonoscopy 07/24/2022-mass in the posterior rectum extending from the dentate line for 3 cm, biopsy-moderately differentiated invasive adenocarcinoma, mismatch repair protein expression intact CT abdomen/pelvis 04/07/2022-cardiac enlargement, hepatic steatosis mild left adrenal  nodularity stable compared to chest CT March 2020, small nonobstructing bilateral renal calculi, extensive colonic diverticula CT chest 06/03/2022-6 mm nodule in the superior right lower lobe-new from 07/12/2018, new 1-2 mm nodule within the left upper lobe Elevated CEA 07/24/2022 MRI pelvis 08/06/2022-tumor at 3.5 cm from the anal verge with involvement of the upper anal sphincter, invasion of the inferior right levator ani muscle, T4b, one 5 mm left posterior perirectal node-in 1 CTs 08/19/2022-partially circumferential mass of the low rectum.  Small bilateral pulmonary nodules slightly enlarged compared to prior examination. Cycle 1 FOLFOX 08/24/2022 Cycle 2 FOLFOX 09/07/2022 Chemotherapy held 09/21/2022 due to severe neutropenia, thrombocytopenia Cycle 3 FOLFOX 10/05/2022, Udenyca, oxaliplatin dose reduced secondary to thrombocytopenia following cycle 2 CAD, status post myocardial infarction 2015 and July 2023, D1 branch LAD angioplasty 12/23/2021 Hyperlipidemia Hypertension PTSD BPH Cigar use    Disposition: Ronald [name] has completed 2 cycles of FOLFOX.  He has tolerated treatment well, but treatment has been complicated by thrombocytopenia and neutropenia.  He will receive G-CSF following this cycle of chemotherapy.  Oxaliplatin will be dose reduced.  We reviewed potential toxicities associated with G-CSF including the chance of developing bone pain.  He will complete cycle 3 FOLFOX today.  Ronald [name] will return for an office visit and chemotherapy in 2 weeks.  We reviewed his medications.  I recommended he not take vitamin supplements while on chemotherapy.  Thornton Papas, MD  10/05/2022  10:08 AM

## 2022-10-05 NOTE — Patient Instructions (Addendum)
[facility]   The chemotherapy medication bag should finish at 46 hours, 96 hours, or 7 days. For example, if your pump is scheduled for 46 hours and it was put on at 4:00 p.m., it should finish at 2:00 p.m. the day it is scheduled to come off regardless of your appointment time.     Estimated time to finish at 11:45am Wednesday October 07, 2022.  If the display on your pump reads "Low Volume" and it is beeping, take the batteries out of the pump and come to the cancer center for it to be taken off.   If the pump alarms go off prior to the pump reading "Low Volume" then call [phone] and someone can assist you.  If the plunger comes out and the chemotherapy medication is leaking out, please use your home chemo spill kit to clean up the spill. Do NOT use paper towels or other household products.  If you have problems or questions regarding your pump, please call either [phone] (24 hours a day) or the cancer center Monday-Friday 8:00 a.m.- 4:30 p.m. at the clinic number and we will assist you. If you are unable to get assistance, then go to the nearest Emergency Department and ask the staff to contact the IV team for assistance.  Discharge Instructions: Thank you for choosing [facility] to provide your oncology and hematology care.   If you have a lab appointment with the Cancer Center, please go directly to the Cancer Center and check in at the registration area.   Wear comfortable clothing and clothing appropriate for easy access to any Portacath or PICC line.   We strive to give you quality time with your provider. You may need to reschedule your appointment if you arrive late (15 or more minutes).  Arriving late affects you and other patients whose appointments are after yours.  Also, if you miss three or more appointments without notifying the office, you may be dismissed from the clinic at the provider's discretion.      For  prescription refill requests, have your pharmacy contact our office and allow 72 hours for refills to be completed.    Today you received the following chemotherapy and/or immunotherapy agents Oxaliplatin, Leucovorin, Fluorouracil.      To help prevent nausea and vomiting after your treatment, we encourage you to take your nausea medication as directed.  BELOW ARE SYMPTOMS THAT SHOULD BE REPORTED IMMEDIATELY: *FEVER GREATER THAN 100.4 F (38 C) OR HIGHER *CHILLS OR SWEATING *NAUSEA AND VOMITING THAT IS NOT CONTROLLED WITH YOUR NAUSEA MEDICATION *UNUSUAL SHORTNESS OF BREATH *UNUSUAL BRUISING OR BLEEDING *URINARY PROBLEMS (pain or burning when urinating, or frequent urination) *BOWEL PROBLEMS (unusual diarrhea, constipation, pain near the anus) TENDERNESS IN MOUTH AND THROAT WITH OR WITHOUT PRESENCE OF ULCERS (sore throat, sores in mouth, or a toothache) UNUSUAL RASH, SWELLING OR PAIN  UNUSUAL VAGINAL DISCHARGE OR ITCHING   Items with * indicate a potential emergency and should be followed up as soon as possible or go to the Emergency Department if any problems should occur.  Please show the CHEMOTHERAPY ALERT CARD or IMMUNOTHERAPY ALERT CARD at check-in to the Emergency Department and triage nurse.  Should you have questions after your visit or need to cancel or reschedule your appointment, please contact [facility]  Dept: [phone]  and follow the prompts.  Office hours are 8:00 a.m. to 4:30 p.m. Monday - Friday. Please note that  voicemails left after 4:00 p.m. may not be returned until the following business day.  We are closed weekends and major holidays. You have access to a nurse at all times for urgent questions. Please call the main number to the clinic Dept: [phone] and follow the prompts.   For any non-urgent questions, you may also contact your provider using MyChart. We now offer e-Visits for anyone 66 and older to request care online  for non-urgent symptoms. For details visit [email or web address].   Also download the MyChart app! Go to the app store, search "MyChart", open the app, select [place], and log in with your MyChart username and password.  Oxaliplatin Injection What is this medication? OXALIPLATIN (ox AL i PLA tin) treats colorectal cancer. It works by slowing down the growth of cancer cells. This medicine may be used for other purposes; ask your health care provider or pharmacist if you have questions. COMMON BRAND NAME(S): Eloxatin What should I tell my care team before I take this medication? They need to know if you have any of these conditions: Heart disease History of irregular heartbeat or rhythm Liver disease Low blood cell levels (white cells, red cells, and platelets) Lung or breathing disease, such as asthma Take medications that treat or prevent blood clots Tingling of the fingers, toes, or other nerve disorder An unusual or allergic reaction to oxaliplatin, other medications, foods, dyes, or preservatives If you or your partner are pregnant or trying to get pregnant Breast-feeding How should I use this medication? This medication is injected into a vein. It is given by your care team in a hospital or clinic setting. Talk to your care team about the use of this medication in children. Special care may be needed. Overdosage: If you think you have taken too much of this medicine contact a poison control center or emergency room at once. NOTE: This medicine is only for you. Do not share this medicine with others. What if I miss a dose? Keep appointments for follow-up doses. It is important not to miss a dose. Call your care team if you are unable to keep an appointment. What may interact with this medication? Do not take this medication with any of the following: Cisapride Dronedarone Pimozide Thioridazine This medication may also interact with the following: Aspirin and aspirin-like  medications Certain medications that treat or prevent blood clots, such as warfarin, apixaban, dabigatran, and rivaroxaban Cisplatin Cyclosporine Diuretics Medications for infection, such as acyclovir, adefovir, amphotericin B, bacitracin, cidofovir, foscarnet, ganciclovir, gentamicin, pentamidine, vancomycin NSAIDs, medications for pain and inflammation, such as ibuprofen or naproxen Other medications that cause heart rhythm changes Pamidronate Zoledronic acid This list may not describe all possible interactions. Give your health care provider a list of all the medicines, herbs, non-prescription drugs, or dietary supplements you use. Also tell them if you smoke, drink alcohol, or use illegal drugs. Some items may interact with your medicine. What should I watch for while using this medication? Your condition will be monitored carefully while you are receiving this medication. You may need blood work while taking this medication. This medication may make you feel generally unwell. This is not uncommon as chemotherapy can affect healthy cells as well as cancer cells. Report any side effects. Continue your course of treatment even though you feel ill unless your care team tells you to stop. This medication may increase your risk of getting an infection. Call your care team for advice if you get a fever, chills, sore throat, or other  symptoms of a cold or flu. Do not treat yourself. Try to avoid being around people who are sick. Avoid taking medications that contain aspirin, acetaminophen, ibuprofen, naproxen, or ketoprofen unless instructed by your care team. These medications may hide a fever. Be careful brushing or flossing your teeth or using a toothpick because you may get an infection or bleed more easily. If you have any dental work done, tell your dentist you are receiving this medication. This medication can make you more sensitive to cold. Do not drink cold drinks or use ice. Cover exposed  skin before coming in contact with cold temperatures or cold objects. When out in cold weather wear warm clothing and cover your mouth and nose to warm the air that goes into your lungs. Tell your care team if you get sensitive to the cold. Talk to your care team if you or your partner are pregnant or think either of you might be pregnant. This medication can cause serious birth defects if taken during pregnancy and for 9 months after the last dose. A negative pregnancy test is required before starting this medication. A reliable form of contraception is recommended while taking this medication and for 9 months after the last dose. Talk to your care team about effective forms of contraception. Do not father a child while taking this medication and for 6 months after the last dose. Use a condom while having sex during this time period. Do not breastfeed while taking this medication and for 3 months after the last dose. This medication may cause infertility. Talk to your care team if you are concerned about your fertility. What side effects may I notice from receiving this medication? Side effects that you should report to your care team as soon as possible: Allergic reactions--skin rash, itching, hives, swelling of the face, lips, tongue, or throat Bleeding--bloody or black, tar-like stools, vomiting blood or brown material that looks like coffee grounds, red or dark brown urine, small red or purple spots on skin, unusual bruising or bleeding Dry cough, shortness of breath or trouble breathing Heart rhythm changes--fast or irregular heartbeat, dizziness, feeling faint or lightheaded, chest pain, trouble breathing Infection--fever, chills, cough, sore throat, wounds that don't heal, pain or trouble when passing urine, general feeling of discomfort or being unwell Liver injury--right upper belly pain, loss of appetite, nausea, light-colored stool, dark yellow or brown urine, yellowing skin or eyes, unusual  weakness or fatigue Low red blood cell level--unusual weakness or fatigue, dizziness, headache, trouble breathing Muscle injury--unusual weakness or fatigue, muscle pain, dark yellow or brown urine, decrease in amount of urine Pain, tingling, or numbness in the hands or feet Sudden and severe headache, confusion, change in vision, seizures, which may be signs of posterior reversible encephalopathy syndrome (PRES) Unusual bruising or bleeding Side effects that usually do not require medical attention (report to your care team if they continue or are bothersome): Diarrhea Nausea Pain, redness, or swelling with sores inside the mouth or throat Unusual weakness or fatigue Vomiting This list may not describe all possible side effects. Call your doctor for medical advice about side effects. You may report side effects to FDA at 1-800-FDA-1088. Where should I keep my medication? This medication is given in a hospital or clinic. It will not be stored at home. NOTE: This sheet is a summary. It may not cover all possible information. If you have questions about this medicine, talk to your doctor, pharmacist, or health care provider.  2023 Elsevier/Gold Standard (2007-06-11  00:00:00) Leucovorin Injection What is this medication? LEUCOVORIN (loo koe VOR in) prevents side effects from certain medications, such as methotrexate. It works by increasing folate levels. This helps protect healthy cells in your body. It may also be used to treat anemia caused by low levels of folate. It can also be used with fluorouracil, a type of chemotherapy, to treat colorectal cancer. It works by increasing the effects of fluorouracil in the body. This medicine may be used for other purposes; ask your health care provider or pharmacist if you have questions. What should I tell my care team before I take this medication? They need to know if you have any of these conditions: Anemia from low levels of vitamin B12 in the  blood An unusual or allergic reaction to leucovorin, folic acid, other medications, foods, dyes, or preservatives Pregnant or trying to get pregnant Breastfeeding How should I use this medication? This medication is injected into a vein or a muscle. It is given by your care team in a hospital or clinic setting. Talk to your care team about the use of this medication in children. Special care may be needed. Overdosage: If you think you have taken too much of this medicine contact a poison control center or emergency room at once. NOTE: This medicine is only for you. Do not share this medicine with others. What if I miss a dose? Keep appointments for follow-up doses. It is important not to miss your dose. Call your care team if you are unable to keep an appointment. What may interact with this medication? Capecitabine Fluorouracil Phenobarbital Phenytoin Primidone Trimethoprim;sulfamethoxazole This list may not describe all possible interactions. Give your health care provider a list of all the medicines, herbs, non-prescription drugs, or dietary supplements you use. Also tell them if you smoke, drink alcohol, or use illegal drugs. Some items may interact with your medicine. What should I watch for while using this medication? Your condition will be monitored carefully while you are receiving this medication. This medication may increase the side effects of 5-fluorouracil. Tell your care team if you have diarrhea or mouth sores that do not get better or that get worse. What side effects may I notice from receiving this medication? Side effects that you should report to your care team as soon as possible: Allergic reactions--skin rash, itching, hives, swelling of the face, lips, tongue, or throat This list may not describe all possible side effects. Call your doctor for medical advice about side effects. You may report side effects to FDA at 1-800-FDA-1088. Where should I keep my  medication? This medication is given in a hospital or clinic. It will not be stored at home. NOTE: This sheet is a summary. It may not cover all possible information. If you have questions about this medicine, talk to your doctor, pharmacist, or health care provider.  2023 Elsevier/Gold Standard (2021-08-29 00:00:00) Fluorouracil Injection What is this medication? FLUOROURACIL (flure oh YOOR a sil) treats some types of cancer. It works by slowing down the growth of cancer cells. This medicine may be used for other purposes; ask your health care provider or pharmacist if you have questions. COMMON BRAND NAME(S): Adrucil What should I tell my care team before I take this medication? They need to know if you have any of these conditions: Blood disorders Dihydropyrimidine dehydrogenase (DPD) deficiency Infection, such as chickenpox, cold sores, herpes Kidney disease Liver disease Poor nutrition Recent or ongoing radiation therapy An unusual or allergic reaction to fluorouracil, other medications,  foods, dyes, or preservatives If you or your partner are pregnant or trying to get pregnant Breast-feeding How should I use this medication? This medication is injected into a vein. It is administered by your care team in a hospital or clinic setting. Talk to your care team about the use of this medication in children. Special care may be needed. Overdosage: If you think you have taken too much of this medicine contact a poison control center or emergency room at once. NOTE: This medicine is only for you. Do not share this medicine with others. What if I miss a dose? Keep appointments for follow-up doses. It is important not to miss your dose. Call your care team if you are unable to keep an appointment. What may interact with this medication? Do not take this medication with any of the following: Live virus vaccines This medication may also interact with the following: Medications that treat or  prevent blood clots, such as warfarin, enoxaparin, dalteparin This list may not describe all possible interactions. Give your health care provider a list of all the medicines, herbs, non-prescription drugs, or dietary supplements you use. Also tell them if you smoke, drink alcohol, or use illegal drugs. Some items may interact with your medicine. What should I watch for while using this medication? Your condition will be monitored carefully while you are receiving this medication. This medication may make you feel generally unwell. This is not uncommon as chemotherapy can affect healthy cells as well as cancer cells. Report any side effects. Continue your course of treatment even though you feel ill unless your care team tells you to stop. In some cases, you may be given additional medications to help with side effects. Follow all directions for their use. This medication may increase your risk of getting an infection. Call your care team for advice if you get a fever, chills, sore throat, or other symptoms of a cold or flu. Do not treat yourself. Try to avoid being around people who are sick. This medication may increase your risk to bruise or bleed. Call your care team if you notice any unusual bleeding. Be careful brushing or flossing your teeth or using a toothpick because you may get an infection or bleed more easily. If you have any dental work done, tell your dentist you are receiving this medication. Avoid taking medications that contain aspirin, acetaminophen, ibuprofen, naproxen, or ketoprofen unless instructed by your care team. These medications may hide a fever. Do not treat diarrhea with over the counter products. Contact your care team if you have diarrhea that lasts more than 2 days or if it is severe and watery. This medication can make you more sensitive to the sun. Keep out of the sun. If you cannot avoid being in the sun, wear protective clothing and sunscreen. Do not use sun lamps,  tanning beds, or tanning booths. Talk to your care team if you or your partner wish to become pregnant or think you might be pregnant. This medication can cause serious birth defects if taken during pregnancy and for 3 months after the last dose. A reliable form of contraception is recommended while taking this medication and for 3 months after the last dose. Talk to your care team about effective forms of contraception. Do not father a child while taking this medication and for 3 months after the last dose. Use a condom while having sex during this time period. Do not breastfeed while taking this medication. This medication may cause infertility.  Talk to your care team if you are concerned about your fertility. What side effects may I notice from receiving this medication? Side effects that you should report to your care team as soon as possible: Allergic reactions--skin rash, itching, hives, swelling of the face, lips, tongue, or throat Heart attack--pain or tightness in the chest, shoulders, arms, or jaw, nausea, shortness of breath, cold or clammy skin, feeling faint or lightheaded Heart failure--shortness of breath, swelling of the ankles, feet, or hands, sudden weight gain, unusual weakness or fatigue Heart rhythm changes--fast or irregular heartbeat, dizziness, feeling faint or lightheaded, chest pain, trouble breathing High ammonia level--unusual weakness or fatigue, confusion, loss of appetite, nausea, vomiting, seizures Infection--fever, chills, cough, sore throat, wounds that don't heal, pain or trouble when passing urine, general feeling of discomfort or being unwell Low red blood cell level--unusual weakness or fatigue, dizziness, headache, trouble breathing Pain, tingling, or numbness in the hands or feet, muscle weakness, change in vision, confusion or trouble speaking, loss of balance or coordination, trouble walking, seizures Redness, swelling, and blistering of the skin over hands and  feet Severe or prolonged diarrhea Unusual bruising or bleeding Side effects that usually do not require medical attention (report to your care team if they continue or are bothersome): Dry skin Headache Increased tears Nausea Pain, redness, or swelling with sores inside the mouth or throat Sensitivity to light Vomiting This list may not describe all possible side effects. Call your doctor for medical advice about side effects. You may report side effects to FDA at 1-800-FDA-1088. Where should I keep my medication? This medication is given in a hospital or clinic. It will not be stored at home. NOTE: This sheet is a summary. It may not cover all possible information. If you have questions about this medicine, talk to your doctor, pharmacist, or health care provider.  2023 Elsevier/Gold Standard (2021-08-19 00:00:00)

## 2022-10-05 NOTE — Progress Notes (Signed)
Patient seen by Dr. Truett Perna today  Vitals are within treatment parameters.  Labs reviewed by Dr. Truett Perna and are within treatment parameters.  Per physician team, patient is ready for treatment. Please note that modifications are being made to the treatment plan including dose reduction of Oxaliplatin.

## 2022-10-07 ENCOUNTER — Inpatient Hospital Stay: Payer: No Typology Code available for payment source

## 2022-10-07 VITALS — BP 112/69 | HR 79 | Temp 98.6°F | Resp 18

## 2022-10-07 DIAGNOSIS — C20 Malignant neoplasm of rectum: Secondary | ICD-10-CM

## 2022-10-07 DIAGNOSIS — Z5111 Encounter for antineoplastic chemotherapy: Secondary | ICD-10-CM | POA: Diagnosis not present

## 2022-10-07 MED ORDER — SODIUM CHLORIDE 0.9% FLUSH
10.0000 mL | INTRAVENOUS | Status: DC | PRN
  Administered 2022-10-07: 10 mL

## 2022-10-07 MED ORDER — PEGFILGRASTIM-CBQV 6 MG/0.6ML ~~LOC~~ SOSY
6.0000 mg | PREFILLED_SYRINGE | Freq: Once | SUBCUTANEOUS | Status: AC
  Administered 2022-10-07: 6 mg via SUBCUTANEOUS
  Filled 2022-10-07: qty 0.6

## 2022-10-07 MED ORDER — HEPARIN SOD (PORK) LOCK FLUSH 100 UNIT/ML IV SOLN
500.0000 [IU] | Freq: Once | INTRAVENOUS | Status: AC | PRN
  Administered 2022-10-07: 500 [IU]

## 2022-10-07 NOTE — Patient Instructions (Signed)

## 2022-10-13 ENCOUNTER — Other Ambulatory Visit: Payer: Self-pay

## 2022-10-13 MED ORDER — CLONIDINE HCL 0.3 MG PO TABS: 0.3000 mg | ORAL_TABLET | Freq: Two times a day (BID) | ORAL | 3 refills | Status: DC

## 2022-10-14 ENCOUNTER — Other Ambulatory Visit: Payer: Self-pay

## 2022-10-18 ENCOUNTER — Other Ambulatory Visit: Payer: Self-pay | Admitting: Oncology

## 2022-10-20 ENCOUNTER — Inpatient Hospital Stay: Payer: No Typology Code available for payment source

## 2022-10-20 ENCOUNTER — Inpatient Hospital Stay (HOSPITAL_BASED_OUTPATIENT_CLINIC_OR_DEPARTMENT_OTHER): Payer: No Typology Code available for payment source | Admitting: Nurse Practitioner

## 2022-10-20 ENCOUNTER — Encounter: Payer: Self-pay | Admitting: Nurse Practitioner

## 2022-10-20 VITALS — BP 122/60 | HR 75 | Temp 98.1°F | Resp 18 | Ht 72.0 in | Wt 166.0 lb

## 2022-10-20 DIAGNOSIS — C20 Malignant neoplasm of rectum: Secondary | ICD-10-CM

## 2022-10-20 DIAGNOSIS — Z5111 Encounter for antineoplastic chemotherapy: Secondary | ICD-10-CM | POA: Diagnosis not present

## 2022-10-20 LAB — CMP (CANCER CENTER ONLY)
ALT: 13 U/L (ref 0–44)
AST: 15 U/L (ref 15–41)
Albumin: 3.7 g/dL (ref 3.5–5.0)
Alkaline Phosphatase: 106 U/L (ref 38–126)
Anion gap: 6 (ref 5–15)
BUN: 10 mg/dL (ref 8–23)
CO2: 25 mmol/L (ref 22–32)
Calcium: 8.3 mg/dL — ABNORMAL LOW (ref 8.9–10.3)
Chloride: 106 mmol/L (ref 98–111)
Creatinine: 0.79 mg/dL (ref 0.61–1.24)
GFR, Estimated: >60 mL/min (ref >60)
Glucose, Bld: 161 mg/dL — ABNORMAL HIGH (ref 70–99)
Potassium: 3.4 mmol/L — ABNORMAL LOW (ref 3.5–5.1)
Sodium: 137 mmol/L (ref 135–145)
Total Bilirubin: 0.4 mg/dL (ref 0.3–1.2)
Total Protein: 5.8 g/dL — ABNORMAL LOW (ref 6.5–8.1)

## 2022-10-20 LAB — CBC WITH DIFFERENTIAL (CANCER CENTER ONLY)
Abs Immature Granulocytes: 0.26 10*3/uL — ABNORMAL HIGH (ref 0.00–0.07)
Basophils Absolute: 0 10*3/uL (ref 0.0–0.1)
Basophils Relative: 1 %
Eosinophils Absolute: 0.2 10*3/uL (ref 0.0–0.5)
Eosinophils Relative: 4 %
HCT: 28.8 % — ABNORMAL LOW (ref 39.0–52.0)
Hemoglobin: 9.8 g/dL — ABNORMAL LOW (ref 13.0–17.0)
Immature Granulocytes: 4 %
Lymphocytes Relative: 30 %
Lymphs Abs: 1.9 10*3/uL (ref 0.7–4.0)
MCH: 32.8 pg (ref 26.0–34.0)
MCHC: 34 g/dL (ref 30.0–36.0)
MCV: 96.3 fL (ref 80.0–100.0)
Monocytes Absolute: 0.6 10*3/uL (ref 0.1–1.0)
Monocytes Relative: 10 %
Neutro Abs: 3.4 10*3/uL (ref 1.7–7.7)
Neutrophils Relative %: 51 %
Platelet Count: 80 10*3/uL — ABNORMAL LOW (ref 150–400)
RBC: 2.99 MIL/uL — ABNORMAL LOW (ref 4.22–5.81)
RDW: 15.2 % (ref 11.5–15.5)
WBC Count: 6.5 10*3/uL (ref 4.0–10.5)
nRBC: 0 % (ref 0.0–0.2)

## 2022-10-20 MED ORDER — SODIUM CHLORIDE 0.9% FLUSH
10.0000 mL | INTRAVENOUS | Status: DC | PRN
  Administered 2022-10-20: 10 mL via INTRAVENOUS

## 2022-10-20 MED ORDER — HEPARIN SOD (PORK) LOCK FLUSH 100 UNIT/ML IV SOLN
500.0000 [IU] | Freq: Once | INTRAVENOUS | Status: AC
  Administered 2022-10-20: 500 [IU] via INTRAVENOUS

## 2022-10-20 MED ORDER — SODIUM CHLORIDE 0.9% FLUSH: 10.0000 mL | INTRAVENOUS | Status: DC

## 2022-10-20 MED ORDER — HEPARIN SOD (PORK) LOCK FLUSH 100 UNIT/ML IV SOLN: 500.0000 [IU] | Freq: Once | INTRAVENOUS | Status: DC

## 2022-10-20 NOTE — Addendum Note (Signed)
Addended by: Dimitri Ped on: 10/20/2022 09:37 AM   Modules accepted: Orders

## 2022-10-20 NOTE — Progress Notes (Signed)
Patient seen by Lonna Cobb NP today  Vitals are within treatment parameters.  Labs reviewed by Lonna Cobb NP and are not all within treatment parameters. Plts 80  Per physician team, patient will not be receiving treatment today.

## 2022-10-20 NOTE — Progress Notes (Signed)
  [facility] OFFICE PROGRESS NOTE   Diagnosis: Rectal cancer  INTERVAL HISTORY:   [name] returns as scheduled.  He completed cycle 3 FOLFOX 10/05/2022.  Oxaliplatin was dose reduced due to neutropenia and thrombocytopenia with the previous cycle.  He received white cell growth factor support.  He denies nausea/vomiting.  No mouth sores though he did note his mouth became tender.  No diarrhea.  Cold sensitivity lasted 2-1/2 days.  No persistent neuropathy symptoms.  No bone pain.  He denies bleeding.  Objective:  Vital signs in last 24 hours:  Blood pressure 122/60, pulse 75, temperature 98.1 F (36.7 C), temperature source Oral, resp. rate 18, height 6' (1.829 m), weight 166 lb (75.3 kg), SpO2 100 %.    HEENT: No thrush or ulcers. Resp: Lungs clear bilaterally. Cardio: Regular rate and rhythm. GI: Abdomen soft and nontender.  No hepatosplenomegaly. Vascular: No leg edema. Skin: Palms without erythema. Port-A-Cath without erythema.  Lab Results:  Lab Results  Component Value Date   WBC 6.5 10/20/2022   HGB 9.8 (L) 10/20/2022   HCT 28.8 (L) 10/20/2022   MCV 96.3 10/20/2022   PLT 80 (L) 10/20/2022   NEUTROABS 3.4 10/20/2022    Imaging:  No results found.  Medications: I have reviewed the patient's current medications.  Assessment/Plan: Rectal cancer Colonoscopy 07/24/2022-mass in the posterior rectum extending from the dentate line for 3 cm, biopsy-moderately differentiated invasive adenocarcinoma, mismatch repair protein expression intact CT abdomen/pelvis 04/07/2022-cardiac enlargement, hepatic steatosis mild left adrenal nodularity stable compared to chest CT March 2020, small nonobstructing bilateral renal calculi, extensive colonic diverticula CT chest 06/03/2022-6 mm nodule in the superior right lower lobe-new from 07/12/2018, new 1-2 mm nodule within the left upper lobe Elevated CEA 07/24/2022 MRI pelvis 08/06/2022-tumor at 3.5 cm from the anal verge  with involvement of the upper anal sphincter, invasion of the inferior right levator ani muscle, T4b, one 5 mm left posterior perirectal node-in 1 CTs 08/19/2022-partially circumferential mass of the low rectum.  Small bilateral pulmonary nodules slightly enlarged compared to prior examination. Cycle 1 FOLFOX 08/24/2022 Cycle 2 FOLFOX 09/07/2022 Chemotherapy held 09/21/2022 due to severe neutropenia, thrombocytopenia Cycle 3 FOLFOX 10/05/2022, Udenyca, oxaliplatin dose reduced secondary to thrombocytopenia following cycle 2  CAD, status post myocardial infarction 2015 and July 2023, D1 branch LAD angioplasty 12/23/2021 Hyperlipidemia Hypertension PTSD BPH Cigar use  Disposition: Ronald [name] appears unchanged.  He has completed 3 cycles of FOLFOX.  He developed neutropenia and thrombocytopenia after cycle 2.  Oxaliplatin was dose reduced with cycle 3.  CBC from today shows moderate thrombocytopenia.  We discussed dose reducing the oxaliplatin again and proceeding with cycle 4 as scheduled.  We discussed the risk of bleeding.  He is uncomfortable with having treatment today.  He would like to hold treatment and return for repeat labs and follow-up chemotherapy in 1 week.  He understands to contact the office with bleeding.  He will return for follow-up in 1 week.    Adhrith Wierman ANP/GNP-BC   10/20/2022  9:24 AM

## 2022-10-22 ENCOUNTER — Inpatient Hospital Stay: Payer: No Typology Code available for payment source

## 2022-10-27 ENCOUNTER — Inpatient Hospital Stay: Payer: No Typology Code available for payment source

## 2022-10-27 ENCOUNTER — Encounter: Payer: Self-pay | Admitting: Nurse Practitioner

## 2022-10-27 ENCOUNTER — Inpatient Hospital Stay (HOSPITAL_BASED_OUTPATIENT_CLINIC_OR_DEPARTMENT_OTHER): Payer: No Typology Code available for payment source | Admitting: Nurse Practitioner

## 2022-10-27 VITALS — BP 109/62 | HR 80 | Temp 98.2°F | Resp 18 | Ht 72.0 in | Wt 167.7 lb

## 2022-10-27 VITALS — BP 126/66 | HR 71

## 2022-10-27 DIAGNOSIS — C20 Malignant neoplasm of rectum: Secondary | ICD-10-CM

## 2022-10-27 DIAGNOSIS — Z5111 Encounter for antineoplastic chemotherapy: Secondary | ICD-10-CM | POA: Diagnosis not present

## 2022-10-27 LAB — CBC WITH DIFFERENTIAL (CANCER CENTER ONLY)
Abs Immature Granulocytes: 0.06 10*3/uL (ref 0.00–0.07)
Basophils Absolute: 0.1 10*3/uL (ref 0.0–0.1)
Basophils Relative: 1 %
Eosinophils Absolute: 0.1 10*3/uL (ref 0.0–0.5)
Eosinophils Relative: 1 %
HCT: 33.1 % — ABNORMAL LOW (ref 39.0–52.0)
Hemoglobin: 10.9 g/dL — ABNORMAL LOW (ref 13.0–17.0)
Immature Granulocytes: 1 %
Lymphocytes Relative: 25 %
Lymphs Abs: 1.8 10*3/uL (ref 0.7–4.0)
MCH: 32.2 pg (ref 26.0–34.0)
MCHC: 32.9 g/dL (ref 30.0–36.0)
MCV: 97.6 fL (ref 80.0–100.0)
Monocytes Absolute: 0.8 10*3/uL (ref 0.1–1.0)
Monocytes Relative: 12 %
Neutro Abs: 4.4 10*3/uL (ref 1.7–7.7)
Neutrophils Relative %: 60 %
Platelet Count: 241 10*3/uL (ref 150–400)
RBC: 3.39 MIL/uL — ABNORMAL LOW (ref 4.22–5.81)
RDW: 15.2 % (ref 11.5–15.5)
WBC Count: 7.2 10*3/uL (ref 4.0–10.5)
nRBC: 0 % (ref 0.0–0.2)

## 2022-10-27 LAB — CMP (CANCER CENTER ONLY)
ALT: 13 U/L (ref 0–44)
AST: 16 U/L (ref 15–41)
Albumin: 3.9 g/dL (ref 3.5–5.0)
Alkaline Phosphatase: 73 U/L (ref 38–126)
Anion gap: 7 (ref 5–15)
BUN: 15 mg/dL (ref 8–23)
CO2: 23 mmol/L (ref 22–32)
Calcium: 8.1 mg/dL — ABNORMAL LOW (ref 8.9–10.3)
Chloride: 107 mmol/L (ref 98–111)
Creatinine: 0.83 mg/dL (ref 0.61–1.24)
GFR, Estimated: >60 mL/min (ref >60)
Glucose, Bld: 137 mg/dL — ABNORMAL HIGH (ref 70–99)
Potassium: 3.5 mmol/L (ref 3.5–5.1)
Sodium: 137 mmol/L (ref 135–145)
Total Bilirubin: 0.4 mg/dL (ref 0.3–1.2)
Total Protein: 6.3 g/dL — ABNORMAL LOW (ref 6.5–8.1)

## 2022-10-27 MED ORDER — LEUCOVORIN CALCIUM INJECTION 350 MG
400.0000 mg/m2 | Freq: Once | INTRAVENOUS | Status: AC
  Administered 2022-10-27: 788 mg via INTRAVENOUS
  Filled 2022-10-27: qty 39.4

## 2022-10-27 MED ORDER — PALONOSETRON HCL INJECTION 0.25 MG/5ML
0.2500 mg | Freq: Once | INTRAVENOUS | Status: AC
  Administered 2022-10-27: 0.25 mg via INTRAVENOUS
  Filled 2022-10-27: qty 5

## 2022-10-27 MED ORDER — DEXTROSE 5 % IV SOLN: Freq: Once | INTRAVENOUS | Status: AC

## 2022-10-27 MED ORDER — OXALIPLATIN CHEMO INJECTION 100 MG/20ML
50.0000 mg/m2 | Freq: Once | INTRAVENOUS | Status: AC
  Administered 2022-10-27: 100 mg via INTRAVENOUS
  Filled 2022-10-27: qty 20

## 2022-10-27 MED ORDER — SODIUM CHLORIDE 0.9 % IV SOLN
10.0000 mg | Freq: Once | INTRAVENOUS | Status: AC
  Administered 2022-10-27: 10 mg via INTRAVENOUS
  Filled 2022-10-27: qty 10

## 2022-10-27 MED ORDER — SODIUM CHLORIDE 0.9 % IV SOLN
2400.0000 mg/m2 | INTRAVENOUS | Status: DC
  Administered 2022-10-27: 5000 mg via INTRAVENOUS
  Filled 2022-10-27: qty 100

## 2022-10-27 MED ORDER — FLUOROURACIL CHEMO INJECTION 2.5 GM/50ML
400.0000 mg/m2 | Freq: Once | INTRAVENOUS | Status: AC
  Administered 2022-10-27: 800 mg via INTRAVENOUS
  Filled 2022-10-27: qty 16

## 2022-10-27 NOTE — Progress Notes (Signed)
Patient seen by Lonna Cobb NP today  Vitals are within treatment parameters.  Labs reviewed by Lonna Cobb NP and are within treatment parameters.  Per physician team, patient is ready for treatment. Please note that modifications are being made to the treatment plan including oxaliplation dose reduced to 50 mg/m2.

## 2022-10-27 NOTE — Patient Instructions (Signed)
[place] [facility]   The chemotherapy medication bag should finish at 46 hours, 96 hours, or 7 days. For example, if your pump is scheduled for 46 hours and it was put on at 4:00 p.m., it should finish at 2:00 p.m. the day it is scheduled to come off regardless of your appointment time.     Estimated time to finish at 12:30 Thursday, October 29, 2022.   If the display on your pump reads "Low Volume" and it is beeping, take the batteries out of the pump and come to the cancer center for it to be taken off.   If the pump alarms go off prior to the pump reading "Low Volume" then call [phone] and someone can assist you.  If the plunger comes out and the chemotherapy medication is leaking out, please use your home chemo spill kit to clean up the spill. Do NOT use paper towels or other household products.  If you have problems or questions regarding your pump, please call either [phone] (24 hours a day) or the cancer center Monday-Friday 8:00 a.m.- 4:30 p.m. at the clinic number and we will assist you. If you are unable to get assistance, then go to the nearest Emergency Department and ask the staff to contact the IV team for assistance.  Discharge Instructions: Thank you for choosing [facility] to provide your oncology and hematology care.   If you have a lab appointment with the Cancer Center, please go directly to the Cancer Center and check in at the registration area.   Wear comfortable clothing and clothing appropriate for easy access to any Portacath or PICC line.   We strive to give you quality time with your provider. You may need to reschedule your appointment if you arrive late (15 or more minutes).  Arriving late affects you and other patients whose appointments are after yours.  Also, if you miss three or more appointments without notifying the office, you may be dismissed from the clinic at the provider's discretion.      For  prescription refill requests, have your pharmacy contact our office and allow 72 hours for refills to be completed.    Today you received the following chemotherapy and/or immunotherapy agents Oxaliplatin, Leucovorin, Fluorouracil.      To help prevent nausea and vomiting after your treatment, we encourage you to take your nausea medication as directed.  BELOW ARE SYMPTOMS THAT SHOULD BE REPORTED IMMEDIATELY: *FEVER GREATER THAN 100.4 F (38 C) OR HIGHER *CHILLS OR SWEATING *NAUSEA AND VOMITING THAT IS NOT CONTROLLED WITH YOUR NAUSEA MEDICATION *UNUSUAL SHORTNESS OF BREATH *UNUSUAL BRUISING OR BLEEDING *URINARY PROBLEMS (pain or burning when urinating, or frequent urination) *BOWEL PROBLEMS (unusual diarrhea, constipation, pain near the anus) TENDERNESS IN MOUTH AND THROAT WITH OR WITHOUT PRESENCE OF ULCERS (sore throat, sores in mouth, or a toothache) UNUSUAL RASH, SWELLING OR PAIN  UNUSUAL VAGINAL DISCHARGE OR ITCHING   Items with * indicate a potential emergency and should be followed up as soon as possible or go to the Emergency Department if any problems should occur.  Please show the CHEMOTHERAPY ALERT CARD or IMMUNOTHERAPY ALERT CARD at check-in to the Emergency Department and triage nurse.  Should you have questions after your visit or need to cancel or reschedule your appointment, please contact [place] [facility]  Dept: [phone]  and follow the prompts.  Office hours are 8:00 a.m. to 4:30 p.m. Monday - Friday. Please note  that voicemails left after 4:00 p.m. may not be returned until the following business day.  We are closed weekends and major holidays. You have access to a nurse at all times for urgent questions. Please call the main number to the clinic Dept: [phone] and follow the prompts.   For any non-urgent questions, you may also contact your provider using MyChart. We now offer e-Visits for anyone 1 and older to request care online  for non-urgent symptoms. For details visit [email or web address].   Also download the MyChart app! Go to the app store, search "MyChart", open the app, select [place], and log in with your MyChart username and password.  Oxaliplatin Injection What is this medication? OXALIPLATIN (ox AL i PLA tin) treats colorectal cancer. It works by slowing down the growth of cancer cells. This medicine may be used for other purposes; ask your health care provider or pharmacist if you have questions. COMMON BRAND NAME(S): Eloxatin What should I tell my care team before I take this medication? They need to know if you have any of these conditions: Heart disease History of irregular heartbeat or rhythm Liver disease Low blood cell levels (white cells, red cells, and platelets) Lung or breathing disease, such as asthma Take medications that treat or prevent blood clots Tingling of the fingers, toes, or other nerve disorder An unusual or allergic reaction to oxaliplatin, other medications, foods, dyes, or preservatives If you or your partner are pregnant or trying to get pregnant Breast-feeding How should I use this medication? This medication is injected into a vein. It is given by your care team in a hospital or clinic setting. Talk to your care team about the use of this medication in children. Special care may be needed. Overdosage: If you think you have taken too much of this medicine contact a poison control center or emergency room at once. NOTE: This medicine is only for you. Do not share this medicine with others. What if I miss a dose? Keep appointments for follow-up doses. It is important not to miss a dose. Call your care team if you are unable to keep an appointment. What may interact with this medication? Do not take this medication with any of the following: Cisapride Dronedarone Pimozide Thioridazine This medication may also interact with the following: Aspirin and aspirin-like  medications Certain medications that treat or prevent blood clots, such as warfarin, apixaban, dabigatran, and rivaroxaban Cisplatin Cyclosporine Diuretics Medications for infection, such as acyclovir, adefovir, amphotericin B, bacitracin, cidofovir, foscarnet, ganciclovir, gentamicin, pentamidine, vancomycin NSAIDs, medications for pain and inflammation, such as ibuprofen or naproxen Other medications that cause heart rhythm changes Pamidronate Zoledronic acid This list may not describe all possible interactions. Give your health care provider a list of all the medicines, herbs, non-prescription drugs, or dietary supplements you use. Also tell them if you smoke, drink alcohol, or use illegal drugs. Some items may interact with your medicine. What should I watch for while using this medication? Your condition will be monitored carefully while you are receiving this medication. You may need blood work while taking this medication. This medication may make you feel generally unwell. This is not uncommon as chemotherapy can affect healthy cells as well as cancer cells. Report any side effects. Continue your course of treatment even though you feel ill unless your care team tells you to stop. This medication may increase your risk of getting an infection. Call your care team for advice if you get a fever, chills, sore throat, or  other symptoms of a cold or flu. Do not treat yourself. Try to avoid being around people who are sick. Avoid taking medications that contain aspirin, acetaminophen, ibuprofen, naproxen, or ketoprofen unless instructed by your care team. These medications may hide a fever. Be careful brushing or flossing your teeth or using a toothpick because you may get an infection or bleed more easily. If you have any dental work done, tell your dentist you are receiving this medication. This medication can make you more sensitive to cold. Do not drink cold drinks or use ice. Cover exposed  skin before coming in contact with cold temperatures or cold objects. When out in cold weather wear warm clothing and cover your mouth and nose to warm the air that goes into your lungs. Tell your care team if you get sensitive to the cold. Talk to your care team if you or your partner are pregnant or think either of you might be pregnant. This medication can cause serious birth defects if taken during pregnancy and for 9 months after the last dose. A negative pregnancy test is required before starting this medication. A reliable form of contraception is recommended while taking this medication and for 9 months after the last dose. Talk to your care team about effective forms of contraception. Do not father a child while taking this medication and for 6 months after the last dose. Use a condom while having sex during this time period. Do not breastfeed while taking this medication and for 3 months after the last dose. This medication may cause infertility. Talk to your care team if you are concerned about your fertility. What side effects may I notice from receiving this medication? Side effects that you should report to your care team as soon as possible: Allergic reactions--skin rash, itching, hives, swelling of the face, lips, tongue, or throat Bleeding--bloody or black, tar-like stools, vomiting blood or brown material that looks like coffee grounds, red or dark brown urine, small red or purple spots on skin, unusual bruising or bleeding Dry cough, shortness of breath or trouble breathing Heart rhythm changes--fast or irregular heartbeat, dizziness, feeling faint or lightheaded, chest pain, trouble breathing Infection--fever, chills, cough, sore throat, wounds that don't heal, pain or trouble when passing urine, general feeling of discomfort or being unwell Liver injury--right upper belly pain, loss of appetite, nausea, light-colored stool, dark yellow or brown urine, yellowing skin or eyes, unusual  weakness or fatigue Low red blood cell level--unusual weakness or fatigue, dizziness, headache, trouble breathing Muscle injury--unusual weakness or fatigue, muscle pain, dark yellow or brown urine, decrease in amount of urine Pain, tingling, or numbness in the hands or feet Sudden and severe headache, confusion, change in vision, seizures, which may be signs of posterior reversible encephalopathy syndrome (PRES) Unusual bruising or bleeding Side effects that usually do not require medical attention (report to your care team if they continue or are bothersome): Diarrhea Nausea Pain, redness, or swelling with sores inside the mouth or throat Unusual weakness or fatigue Vomiting This list may not describe all possible side effects. Call your doctor for medical advice about side effects. You may report side effects to FDA at 1-800-FDA-1088. Where should I keep my medication? This medication is given in a hospital or clinic. It will not be stored at home. NOTE: This sheet is a summary. It may not cover all possible information. If you have questions about this medicine, talk to your doctor, pharmacist, or health care provider.  2024 Elsevier/Gold Standard (  2022-06-28 00:00:00) Leucovorin Injection What is this medication? LEUCOVORIN (loo koe VOR in) prevents side effects from certain medications, such as methotrexate. It works by increasing folate levels. This helps protect healthy cells in your body. It may also be used to treat anemia caused by low levels of folate. It can also be used with fluorouracil, a type of chemotherapy, to treat colorectal cancer. It works by increasing the effects of fluorouracil in the body. This medicine may be used for other purposes; ask your health care provider or pharmacist if you have questions. What should I tell my care team before I take this medication? They need to know if you have any of these conditions: Anemia from low levels of vitamin B12 in the  blood An unusual or allergic reaction to leucovorin, folic acid, other medications, foods, dyes, or preservatives Pregnant or trying to get pregnant Breastfeeding How should I use this medication? This medication is injected into a vein or a muscle. It is given by your care team in a hospital or clinic setting. Talk to your care team about the use of this medication in children. Special care may be needed. Overdosage: If you think you have taken too much of this medicine contact a poison control center or emergency room at once. NOTE: This medicine is only for you. Do not share this medicine with others. What if I miss a dose? Keep appointments for follow-up doses. It is important not to miss your dose. Call your care team if you are unable to keep an appointment. What may interact with this medication? Capecitabine Fluorouracil Phenobarbital Phenytoin Primidone Trimethoprim;sulfamethoxazole This list may not describe all possible interactions. Give your health care provider a list of all the medicines, herbs, non-prescription drugs, or dietary supplements you use. Also tell them if you smoke, drink alcohol, or use illegal drugs. Some items may interact with your medicine. What should I watch for while using this medication? Your condition will be monitored carefully while you are receiving this medication. This medication may increase the side effects of 5-fluorouracil. Tell your care team if you have diarrhea or mouth sores that do not get better or that get worse. What side effects may I notice from receiving this medication? Side effects that you should report to your care team as soon as possible: Allergic reactions--skin rash, itching, hives, swelling of the face, lips, tongue, or throat This list may not describe all possible side effects. Call your doctor for medical advice about side effects. You may report side effects to FDA at 1-800-FDA-1088. Where should I keep my  medication? This medication is given in a hospital or clinic. It will not be stored at home. NOTE: This sheet is a summary. It may not cover all possible information. If you have questions about this medicine, talk to your doctor, pharmacist, or health care provider.  2024 Elsevier/Gold Standard (2021-09-23 00:00:00) Fluorouracil Injection What is this medication? FLUOROURACIL (flure oh YOOR a sil) treats some types of cancer. It works by slowing down the growth of cancer cells. This medicine may be used for other purposes; ask your health care provider or pharmacist if you have questions. COMMON BRAND NAME(S): Adrucil What should I tell my care team before I take this medication? They need to know if you have any of these conditions: Blood disorders Dihydropyrimidine dehydrogenase (DPD) deficiency Infection, such as chickenpox, cold sores, herpes Kidney disease Liver disease Poor nutrition Recent or ongoing radiation therapy An unusual or allergic reaction to fluorouracil, other  medications, foods, dyes, or preservatives If you or your partner are pregnant or trying to get pregnant Breast-feeding How should I use this medication? This medication is injected into a vein. It is administered by your care team in a hospital or clinic setting. Talk to your care team about the use of this medication in children. Special care may be needed. Overdosage: If you think you have taken too much of this medicine contact a poison control center or emergency room at once. NOTE: This medicine is only for you. Do not share this medicine with others. What if I miss a dose? Keep appointments for follow-up doses. It is important not to miss your dose. Call your care team if you are unable to keep an appointment. What may interact with this medication? Do not take this medication with any of the following: Live virus vaccines This medication may also interact with the following: Medications that treat or  prevent blood clots, such as warfarin, enoxaparin, dalteparin This list may not describe all possible interactions. Give your health care provider a list of all the medicines, herbs, non-prescription drugs, or dietary supplements you use. Also tell them if you smoke, drink alcohol, or use illegal drugs. Some items may interact with your medicine. What should I watch for while using this medication? Your condition will be monitored carefully while you are receiving this medication. This medication may make you feel generally unwell. This is not uncommon as chemotherapy can affect healthy cells as well as cancer cells. Report any side effects. Continue your course of treatment even though you feel ill unless your care team tells you to stop. In some cases, you may be given additional medications to help with side effects. Follow all directions for their use. This medication may increase your risk of getting an infection. Call your care team for advice if you get a fever, chills, sore throat, or other symptoms of a cold or flu. Do not treat yourself. Try to avoid being around people who are sick. This medication may increase your risk to bruise or bleed. Call your care team if you notice any unusual bleeding. Be careful brushing or flossing your teeth or using a toothpick because you may get an infection or bleed more easily. If you have any dental work done, tell your dentist you are receiving this medication. Avoid taking medications that contain aspirin, acetaminophen, ibuprofen, naproxen, or ketoprofen unless instructed by your care team. These medications may hide a fever. Do not treat diarrhea with over the counter products. Contact your care team if you have diarrhea that lasts more than 2 days or if it is severe and watery. This medication can make you more sensitive to the sun. Keep out of the sun. If you cannot avoid being in the sun, wear protective clothing and sunscreen. Do not use sun lamps,  tanning beds, or tanning booths. Talk to your care team if you or your partner wish to become pregnant or think you might be pregnant. This medication can cause serious birth defects if taken during pregnancy and for 3 months after the last dose. A reliable form of contraception is recommended while taking this medication and for 3 months after the last dose. Talk to your care team about effective forms of contraception. Do not father a child while taking this medication and for 3 months after the last dose. Use a condom while having sex during this time period. Do not breastfeed while taking this medication. This medication may cause  infertility. Talk to your care team if you are concerned about your fertility. What side effects may I notice from receiving this medication? Side effects that you should report to your care team as soon as possible: Allergic reactions--skin rash, itching, hives, swelling of the face, lips, tongue, or throat Heart attack--pain or tightness in the chest, shoulders, arms, or jaw, nausea, shortness of breath, cold or clammy skin, feeling faint or lightheaded Heart failure--shortness of breath, swelling of the ankles, feet, or hands, sudden weight gain, unusual weakness or fatigue Heart rhythm changes--fast or irregular heartbeat, dizziness, feeling faint or lightheaded, chest pain, trouble breathing High ammonia level--unusual weakness or fatigue, confusion, loss of appetite, nausea, vomiting, seizures Infection--fever, chills, cough, sore throat, wounds that don't heal, pain or trouble when passing urine, general feeling of discomfort or being unwell Low red blood cell level--unusual weakness or fatigue, dizziness, headache, trouble breathing Pain, tingling, or numbness in the hands or feet, muscle weakness, change in vision, confusion or trouble speaking, loss of balance or coordination, trouble walking, seizures Redness, swelling, and blistering of the skin over hands and  feet Severe or prolonged diarrhea Unusual bruising or bleeding Side effects that usually do not require medical attention (report to your care team if they continue or are bothersome): Dry skin Headache Increased tears Nausea Pain, redness, or swelling with sores inside the mouth or throat Sensitivity to light Vomiting This list may not describe all possible side effects. Call your doctor for medical advice about side effects. You may report side effects to FDA at 1-800-FDA-1088. Where should I keep my medication? This medication is given in a hospital or clinic. It will not be stored at home. NOTE: This sheet is a summary. It may not cover all possible information. If you have questions about this medicine, talk to your doctor, pharmacist, or health care provider.  2024 Elsevier/Gold Standard (2021-08-26 00:00:00)

## 2022-10-27 NOTE — Progress Notes (Signed)
  [facility] OFFICE PROGRESS NOTE   Diagnosis: Rectal cancer  INTERVAL HISTORY:   Mr. Bennis returns as scheduled.  He has completed 3 cycles of FOLFOX.  Cycle 4 was held 10/20/2022 due to thrombocytopenia.  He denies bruising/bleeding.  No nausea or vomiting.  No diarrhea.  No mouth sores.  Left side of the "throat" is mildly sore intermittently.  He denies neuropathy symptoms including cold sensitivity.  Objective:  Vital signs in last 24 hours:  Blood pressure 109/62, pulse 80, temperature 98.2 F (36.8 C), temperature source Oral, resp. rate 18, height 6' (1.829 m), weight 167 lb 11.2 oz (76.1 kg), SpO2 100 %.    HEENT: No thrush or ulcers. Resp: Lungs clear bilaterally. Cardio: Regular rate and rhythm. GI: Abdomen soft and nontender.  No hepatosplenomegaly. Vascular: No leg edema. Skin: Palms without erythema. Port-A-Cath without erythema.  Lab Results:  Lab Results  Component Value Date   WBC 7.2 10/27/2022   HGB 10.9 (L) 10/27/2022   HCT 33.1 (L) 10/27/2022   MCV 97.6 10/27/2022   PLT 241 10/27/2022   NEUTROABS 4.4 10/27/2022    Imaging:  No results found.  Medications: I have reviewed the patient's current medications.  Assessment/Plan: Rectal cancer Colonoscopy 07/24/2022-mass in the posterior rectum extending from the dentate line for 3 cm, biopsy-moderately differentiated invasive adenocarcinoma, mismatch repair protein expression intact CT abdomen/pelvis 04/07/2022-cardiac enlargement, hepatic steatosis mild left adrenal nodularity stable compared to chest CT March 2020, small nonobstructing bilateral renal calculi, extensive colonic diverticula CT chest 06/03/2022-6 mm nodule in the superior right lower lobe-new from 07/12/2018, new 1-2 mm nodule within the left upper lobe Elevated CEA 07/24/2022 MRI pelvis 08/06/2022-tumor at 3.5 cm from the anal verge with involvement of the upper anal sphincter, invasion of the inferior right levator ani  muscle, T4b, one 5 mm left posterior perirectal node-in 1 CTs 08/19/2022-partially circumferential mass of the low rectum.  Small bilateral pulmonary nodules slightly enlarged compared to prior examination.  Plan for follow-up CT chest after completing 6 cycles of chemotherapy. Cycle 1 FOLFOX 08/24/2022 Cycle 2 FOLFOX 09/07/2022 Chemotherapy held 09/21/2022 due to severe neutropenia, thrombocytopenia Cycle 3 FOLFOX 10/05/2022, Udenyca, oxaliplatin dose reduced secondary to thrombocytopenia following cycle 2 Treatment held 10/20/2022 due to thrombocytopenia Cycle 4 FOLFOX 10/27/2022, Udenyca, oxaliplatin dose reduced secondary to thrombocytopenia following cycle 3   CAD, status post myocardial infarction 2015 and July 2023, D1 branch LAD angioplasty 12/23/2021 Hyperlipidemia Hypertension PTSD BPH Cigar use  Disposition: Mr. Sampedro appears stable.  He has completed 3 cycles of FOLFOX.  Cycle 4 was held last week due to thrombocytopenia.  He declined treatment at that time due to concern regarding the bleeding risk.  Platelet count has recovered.  Plan to proceed with cycle 4 FOLFOX today as scheduled, oxaliplatin will be further dose reduced.  CBC and chemistry panel reviewed.  Labs adequate to proceed as above.  He will return for lab, follow-up, cycle 5 FOLFOX in 2 weeks.  He will contact the office in the interim with any problems.    Clebert Wenger ANP/GNP-BC   10/27/2022  9:42 AM

## 2022-10-29 ENCOUNTER — Inpatient Hospital Stay: Payer: No Typology Code available for payment source

## 2022-10-29 VITALS — BP 117/62 | HR 82 | Temp 98.1°F | Resp 18

## 2022-10-29 DIAGNOSIS — Z5111 Encounter for antineoplastic chemotherapy: Secondary | ICD-10-CM | POA: Diagnosis not present

## 2022-10-29 DIAGNOSIS — C20 Malignant neoplasm of rectum: Secondary | ICD-10-CM

## 2022-10-29 MED ORDER — HEPARIN SOD (PORK) LOCK FLUSH 100 UNIT/ML IV SOLN
500.0000 [IU] | Freq: Once | INTRAVENOUS | Status: AC | PRN
  Administered 2022-10-29: 500 [IU]

## 2022-10-29 MED ORDER — SODIUM CHLORIDE 0.9% FLUSH
10.0000 mL | INTRAVENOUS | Status: DC | PRN
  Administered 2022-10-29: 10 mL

## 2022-10-29 MED ORDER — PEGFILGRASTIM-CBQV 6 MG/0.6ML ~~LOC~~ SOSY
6.0000 mg | PREFILLED_SYRINGE | Freq: Once | SUBCUTANEOUS | Status: AC
  Administered 2022-10-29: 6 mg via SUBCUTANEOUS
  Filled 2022-10-29: qty 0.6

## 2022-10-29 NOTE — Patient Instructions (Signed)

## 2022-11-02 ENCOUNTER — Inpatient Hospital Stay: Payer: No Typology Code available for payment source

## 2022-11-02 ENCOUNTER — Inpatient Hospital Stay: Payer: No Typology Code available for payment source | Admitting: Nurse Practitioner

## 2022-11-04 ENCOUNTER — Inpatient Hospital Stay: Payer: No Typology Code available for payment source

## 2022-11-08 ENCOUNTER — Other Ambulatory Visit: Payer: Self-pay | Admitting: Oncology

## 2022-11-09 ENCOUNTER — Inpatient Hospital Stay (HOSPITAL_BASED_OUTPATIENT_CLINIC_OR_DEPARTMENT_OTHER): Payer: No Typology Code available for payment source | Admitting: Nurse Practitioner

## 2022-11-09 ENCOUNTER — Encounter: Payer: Self-pay | Admitting: Nurse Practitioner

## 2022-11-09 ENCOUNTER — Inpatient Hospital Stay: Payer: No Typology Code available for payment source

## 2022-11-09 ENCOUNTER — Inpatient Hospital Stay: Payer: No Typology Code available for payment source | Attending: Oncology

## 2022-11-09 DIAGNOSIS — Z5189 Encounter for other specified aftercare: Secondary | ICD-10-CM | POA: Insufficient documentation

## 2022-11-09 DIAGNOSIS — F17290 Nicotine dependence, other tobacco product, uncomplicated: Secondary | ICD-10-CM | POA: Insufficient documentation

## 2022-11-09 DIAGNOSIS — D696 Thrombocytopenia, unspecified: Secondary | ICD-10-CM | POA: Diagnosis not present

## 2022-11-09 DIAGNOSIS — C20 Malignant neoplasm of rectum: Secondary | ICD-10-CM | POA: Diagnosis not present

## 2022-11-09 DIAGNOSIS — Z5111 Encounter for antineoplastic chemotherapy: Secondary | ICD-10-CM | POA: Diagnosis not present

## 2022-11-09 DIAGNOSIS — R918 Other nonspecific abnormal finding of lung field: Secondary | ICD-10-CM | POA: Diagnosis not present

## 2022-11-09 DIAGNOSIS — I2510 Atherosclerotic heart disease of native coronary artery without angina pectoris: Secondary | ICD-10-CM | POA: Insufficient documentation

## 2022-11-09 DIAGNOSIS — E876 Hypokalemia: Secondary | ICD-10-CM | POA: Diagnosis not present

## 2022-11-09 DIAGNOSIS — Z95828 Presence of other vascular implants and grafts: Secondary | ICD-10-CM

## 2022-11-09 LAB — CMP (CANCER CENTER ONLY)
ALT: 12 U/L (ref 0–44)
AST: 13 U/L — ABNORMAL LOW (ref 15–41)
Albumin: 4.1 g/dL (ref 3.5–5.0)
Alkaline Phosphatase: 99 U/L (ref 38–126)
Anion gap: 6 (ref 5–15)
BUN: 14 mg/dL (ref 8–23)
CO2: 25 mmol/L (ref 22–32)
Calcium: 8.5 mg/dL — ABNORMAL LOW (ref 8.9–10.3)
Chloride: 105 mmol/L (ref 98–111)
Creatinine: 0.74 mg/dL (ref 0.61–1.24)
GFR, Estimated: >60 mL/min (ref >60)
Glucose, Bld: 173 mg/dL — ABNORMAL HIGH (ref 70–99)
Potassium: 3.5 mmol/L (ref 3.5–5.1)
Sodium: 136 mmol/L (ref 135–145)
Total Bilirubin: 0.4 mg/dL (ref 0.3–1.2)
Total Protein: 6.2 g/dL — ABNORMAL LOW (ref 6.5–8.1)

## 2022-11-09 LAB — CBC WITH DIFFERENTIAL (CANCER CENTER ONLY)
Abs Immature Granulocytes: 0.07 10*3/uL (ref 0.00–0.07)
Basophils Absolute: 0 10*3/uL (ref 0.0–0.1)
Basophils Relative: 0 %
Eosinophils Absolute: 0.1 10*3/uL (ref 0.0–0.5)
Eosinophils Relative: 2 %
HCT: 31.5 % — ABNORMAL LOW (ref 39.0–52.0)
Hemoglobin: 10.5 g/dL — ABNORMAL LOW (ref 13.0–17.0)
Immature Granulocytes: 1 %
Lymphocytes Relative: 27 %
Lymphs Abs: 1.8 10*3/uL (ref 0.7–4.0)
MCH: 32.6 pg (ref 26.0–34.0)
MCHC: 33.3 g/dL (ref 30.0–36.0)
MCV: 97.8 fL (ref 80.0–100.0)
Monocytes Absolute: 0.6 10*3/uL (ref 0.1–1.0)
Monocytes Relative: 8 %
Neutro Abs: 4.1 10*3/uL (ref 1.7–7.7)
Neutrophils Relative %: 62 %
Platelet Count: 127 10*3/uL — ABNORMAL LOW (ref 150–400)
RBC: 3.22 MIL/uL — ABNORMAL LOW (ref 4.22–5.81)
RDW: 15 % (ref 11.5–15.5)
WBC Count: 6.8 10*3/uL (ref 4.0–10.5)
nRBC: 0 % (ref 0.0–0.2)

## 2022-11-09 MED ORDER — DEXTROSE 5 % IV SOLN: Freq: Once | INTRAVENOUS | Status: AC

## 2022-11-09 MED ORDER — SODIUM CHLORIDE 0.9 % IV SOLN
10.0000 mg | Freq: Once | INTRAVENOUS | Status: AC
  Administered 2022-11-09: 10 mg via INTRAVENOUS
  Filled 2022-11-09: qty 10

## 2022-11-09 MED ORDER — OXALIPLATIN CHEMO INJECTION 100 MG/20ML
50.0000 mg/m2 | Freq: Once | INTRAVENOUS | Status: AC
  Administered 2022-11-09: 100 mg via INTRAVENOUS
  Filled 2022-11-09: qty 20

## 2022-11-09 MED ORDER — SODIUM CHLORIDE 0.9 % IV SOLN
2400.0000 mg/m2 | INTRAVENOUS | Status: DC
  Administered 2022-11-09: 5000 mg via INTRAVENOUS
  Filled 2022-11-09: qty 100

## 2022-11-09 MED ORDER — FLUOROURACIL CHEMO INJECTION 2.5 GM/50ML
400.0000 mg/m2 | Freq: Once | INTRAVENOUS | Status: AC
  Administered 2022-11-09: 800 mg via INTRAVENOUS
  Filled 2022-11-09: qty 16

## 2022-11-09 MED ORDER — SODIUM CHLORIDE 0.9% FLUSH
10.0000 mL | INTRAVENOUS | Status: DC | PRN
  Administered 2022-11-09: 10 mL via INTRAVENOUS

## 2022-11-09 MED ORDER — PALONOSETRON HCL INJECTION 0.25 MG/5ML
0.2500 mg | Freq: Once | INTRAVENOUS | Status: AC
  Administered 2022-11-09: 0.25 mg via INTRAVENOUS
  Filled 2022-11-09: qty 5

## 2022-11-09 MED ORDER — LEUCOVORIN CALCIUM INJECTION 350 MG
400.0000 mg/m2 | Freq: Once | INTRAVENOUS | Status: AC
  Administered 2022-11-09: 788 mg via INTRAVENOUS
  Filled 2022-11-09: qty 39.4

## 2022-11-09 NOTE — Progress Notes (Signed)
Patient seen by Lisa Keilen NP today  Vitals are within treatment parameters.  Labs reviewed by Lisa Brody NP and are within treatment parameters.  Per physician team, patient is ready for treatment and there are NO modifications to the treatment plan.     

## 2022-11-09 NOTE — Progress Notes (Signed)
  [facility] OFFICE PROGRESS NOTE   Diagnosis: Rectal cancer  INTERVAL HISTORY:   Ronald [name] returns as scheduled.  He completed cycle 4 FOLFOX 10/27/2022.  Oxaliplatin was dose reduced secondary to thrombocytopenia.  He denies nausea/vomiting.  No mouth sores.  No diarrhea.  Cold sensitivity lasted about 3 days.  No persistent neuropathy symptoms.  Objective:  Vital signs in last 24 hours:  Blood pressure 124/60, pulse 87, temperature 98.1 F (36.7 C), temperature source Oral, resp. rate 18, height 6' (1.829 m), weight 164 lb 3.2 oz (74.5 kg), SpO2 99 %.    HEENT: No thrush or ulcers. Resp: Lungs clear bilaterally. Cardio: Regular rate and rhythm. GI: Abdomen soft and nontender.  No hepatosplenomegaly. Vascular: No leg edema. Neuro: Vibratory sense minimally decreased over the fingertips per tuning fork exam. Skin: Palms without erythema. Port-A-Cath without erythema.  Lab Results:  Lab Results  Component Value Date   WBC 6.8 11/09/2022   HGB 10.5 (L) 11/09/2022   HCT 31.5 (L) 11/09/2022   MCV 97.8 11/09/2022   PLT 127 (L) 11/09/2022   NEUTROABS 4.1 11/09/2022    Imaging:  No results found.  Medications: I have reviewed the patient's current medications.  Assessment/Plan: Rectal cancer Colonoscopy 07/24/2022-mass in the posterior rectum extending from the dentate line for 3 cm, biopsy-moderately differentiated invasive adenocarcinoma, mismatch repair protein expression intact CT abdomen/pelvis 04/07/2022-cardiac enlargement, hepatic steatosis mild left adrenal nodularity stable compared to chest CT March 2020, small nonobstructing bilateral renal calculi, extensive colonic diverticula CT chest 06/03/2022-6 mm nodule in the superior right lower lobe-new from 07/12/2018, new 1-2 mm nodule within the left upper lobe Elevated CEA 07/24/2022 MRI pelvis 08/06/2022-tumor at 3.5 cm from the anal verge with involvement of the upper anal sphincter, invasion of the  inferior right levator ani muscle, T4b, one 5 mm left posterior perirectal node-in 1 CTs 08/19/2022-partially circumferential mass of the low rectum.  Small bilateral pulmonary nodules slightly enlarged compared to prior examination.  Plan for follow-up CT chest after completing 6 cycles of chemotherapy. Cycle 1 FOLFOX 08/24/2022 Cycle 2 FOLFOX 09/07/2022 Chemotherapy held 09/21/2022 due to severe neutropenia, thrombocytopenia Cycle 3 FOLFOX 10/05/2022, Udenyca, oxaliplatin dose reduced secondary to thrombocytopenia following cycle 2 Treatment held 10/20/2022 due to thrombocytopenia Cycle 4 FOLFOX 10/27/2022, Udenyca, oxaliplatin dose reduced secondary to thrombocytopenia following cycle 3 Cycle 5 FOLFOX 11/09/2022, Udenyca   CAD, status post myocardial infarction 2015 and July 2023, D1 branch LAD angioplasty 12/23/2021 Hyperlipidemia Hypertension PTSD BPH Cigar use  Disposition: Ronald [name] appears stable.  He has completed 4 cycles of FOLFOX.  Plan to proceed with cycle 5 today as scheduled.  CBC and chemistry panel reviewed.  Labs adequate to proceed as above.  He will return for follow-up and treatment in 2 weeks.  We are available to see him sooner if needed.    Ronald [name] ANP/GNP-BC   11/09/2022  9:47 AM

## 2022-11-09 NOTE — Patient Instructions (Signed)
[facility] [place] [facility]  Discharge Instructions: Thank you for choosing [facility] to provide your oncology and hematology care.   If you have a lab appointment with the Cancer Center, please go directly to the Cancer Center and check in at the registration area.   Wear comfortable clothing and clothing appropriate for easy access to any Portacath or PICC line.   We strive to give you quality time with your provider. You may need to reschedule your appointment if you arrive late (15 or more minutes).  Arriving late affects you and other patients whose appointments are after yours.  Also, if you miss three or more appointments without notifying the office, you may be dismissed from the clinic at the provider's discretion.      For prescription refill requests, have your pharmacy contact our office and allow 72 hours for refills to be completed.    Today you received the following chemotherapy and/or immunotherapy agents Oxaliplatin, Leucovorin and 5FU.   To help prevent nausea and vomiting after your treatment, we encourage you to take your nausea medication as directed.  BELOW ARE SYMPTOMS THAT SHOULD BE REPORTED IMMEDIATELY: *FEVER GREATER THAN 100.4 F (38 C) OR HIGHER *CHILLS OR SWEATING *NAUSEA AND VOMITING THAT IS NOT CONTROLLED WITH YOUR NAUSEA MEDICATION *UNUSUAL SHORTNESS OF BREATH *UNUSUAL BRUISING OR BLEEDING *URINARY PROBLEMS (pain or burning when urinating, or frequent urination) *BOWEL PROBLEMS (unusual diarrhea, constipation, pain near the anus) TENDERNESS IN MOUTH AND THROAT WITH OR WITHOUT PRESENCE OF ULCERS (sore throat, sores in mouth, or a toothache) UNUSUAL RASH, SWELLING OR PAIN  UNUSUAL VAGINAL DISCHARGE OR ITCHING   Items with * indicate a potential emergency and should be followed up as soon as possible or go to the Emergency Department if any problems should occur.  Please show the CHEMOTHERAPY ALERT CARD or IMMUNOTHERAPY  ALERT CARD at check-in to the Emergency Department and triage nurse.  Should you have questions after your visit or need to cancel or reschedule your appointment, please contact [place] [facility]  Dept: [phone]  and follow the prompts.  Office hours are 8:00 a.m. to 4:30 p.m. Monday - Friday. Please note that voicemails left after 4:00 p.m. may not be returned until the following business day.  We are closed weekends and major holidays. You have access to a nurse at all times for urgent questions. Please call the main number to the clinic Dept: [phone] and follow the prompts.   For any non-urgent questions, you may also contact your provider using MyChart. We now offer e-Visits for anyone 38 and older to request care online for non-urgent symptoms. For details visit [email or web address].   Also download the MyChart app! Go to the app store, search "MyChart", open the app, select [place], and log in with your MyChart username and password.

## 2022-11-11 ENCOUNTER — Inpatient Hospital Stay: Payer: No Typology Code available for payment source

## 2022-11-11 VITALS — BP 109/61 | HR 68 | Temp 98.4°F | Resp 18

## 2022-11-11 DIAGNOSIS — C20 Malignant neoplasm of rectum: Secondary | ICD-10-CM

## 2022-11-11 DIAGNOSIS — Z5111 Encounter for antineoplastic chemotherapy: Secondary | ICD-10-CM | POA: Diagnosis not present

## 2022-11-11 MED ORDER — PEGFILGRASTIM-CBQV 6 MG/0.6ML ~~LOC~~ SOSY
6.0000 mg | PREFILLED_SYRINGE | Freq: Once | SUBCUTANEOUS | Status: AC
  Administered 2022-11-11: 6 mg via SUBCUTANEOUS
  Filled 2022-11-11: qty 0.6

## 2022-11-11 MED ORDER — HEPARIN SOD (PORK) LOCK FLUSH 100 UNIT/ML IV SOLN
500.0000 [IU] | Freq: Once | INTRAVENOUS | Status: AC | PRN
  Administered 2022-11-11: 500 [IU]

## 2022-11-11 MED ORDER — SODIUM CHLORIDE 0.9% FLUSH
10.0000 mL | INTRAVENOUS | Status: DC | PRN
  Administered 2022-11-11: 10 mL

## 2022-11-11 NOTE — Patient Instructions (Signed)

## 2022-11-18 ENCOUNTER — Other Ambulatory Visit: Payer: Self-pay

## 2022-11-18 DIAGNOSIS — I25118 Atherosclerotic heart disease of native coronary artery with other forms of angina pectoris: Secondary | ICD-10-CM

## 2022-11-18 MED ORDER — METOPROLOL SUCCINATE ER 25 MG PO TB24
25.0000 mg | ORAL_TABLET | Freq: Every day | ORAL | 3 refills | Status: AC
Start: 2022-11-18 — End: ?

## 2022-11-22 ENCOUNTER — Other Ambulatory Visit: Payer: Self-pay | Admitting: Oncology

## 2022-11-24 ENCOUNTER — Encounter: Payer: Self-pay | Admitting: *Deleted

## 2022-11-24 ENCOUNTER — Inpatient Hospital Stay: Payer: No Typology Code available for payment source

## 2022-11-24 ENCOUNTER — Inpatient Hospital Stay (HOSPITAL_BASED_OUTPATIENT_CLINIC_OR_DEPARTMENT_OTHER): Payer: No Typology Code available for payment source | Admitting: Nurse Practitioner

## 2022-11-24 ENCOUNTER — Encounter: Payer: Self-pay | Admitting: Nurse Practitioner

## 2022-11-24 VITALS — BP 145/73 | HR 61 | Resp 18

## 2022-11-24 VITALS — BP 115/62 | HR 74 | Temp 98.1°F | Resp 20 | Ht 72.0 in | Wt 163.1 lb

## 2022-11-24 DIAGNOSIS — C20 Malignant neoplasm of rectum: Secondary | ICD-10-CM

## 2022-11-24 DIAGNOSIS — Z5111 Encounter for antineoplastic chemotherapy: Secondary | ICD-10-CM | POA: Diagnosis not present

## 2022-11-24 LAB — CBC WITH DIFFERENTIAL (CANCER CENTER ONLY)
Abs Immature Granulocytes: 0.51 10*3/uL — ABNORMAL HIGH (ref 0.00–0.07)
Basophils Absolute: 0.1 10*3/uL (ref 0.0–0.1)
Basophils Relative: 1 %
Eosinophils Absolute: 0.1 10*3/uL (ref 0.0–0.5)
Eosinophils Relative: 1 %
HCT: 32.3 % — ABNORMAL LOW (ref 39.0–52.0)
Hemoglobin: 11 g/dL — ABNORMAL LOW (ref 13.0–17.0)
Immature Granulocytes: 6 %
Lymphocytes Relative: 27 %
Lymphs Abs: 2.2 10*3/uL (ref 0.7–4.0)
MCH: 33.1 pg (ref 26.0–34.0)
MCHC: 34.1 g/dL (ref 30.0–36.0)
MCV: 97.3 fL (ref 80.0–100.0)
Monocytes Absolute: 0.7 10*3/uL (ref 0.1–1.0)
Monocytes Relative: 8 %
Neutro Abs: 4.7 10*3/uL (ref 1.7–7.7)
Neutrophils Relative %: 57 %
Platelet Count: 117 10*3/uL — ABNORMAL LOW (ref 150–400)
RBC: 3.32 MIL/uL — ABNORMAL LOW (ref 4.22–5.81)
RDW: 15.2 % (ref 11.5–15.5)
WBC Count: 8.1 10*3/uL (ref 4.0–10.5)
nRBC: 0 % (ref 0.0–0.2)

## 2022-11-24 LAB — CMP (CANCER CENTER ONLY)
ALT: 17 U/L (ref 0–44)
AST: 20 U/L (ref 15–41)
Albumin: 4.1 g/dL (ref 3.5–5.0)
Alkaline Phosphatase: 103 U/L (ref 38–126)
Anion gap: 6 (ref 5–15)
BUN: 12 mg/dL (ref 8–23)
CO2: 27 mmol/L (ref 22–32)
Calcium: 8.7 mg/dL — ABNORMAL LOW (ref 8.9–10.3)
Chloride: 105 mmol/L (ref 98–111)
Creatinine: 0.87 mg/dL (ref 0.61–1.24)
GFR, Estimated: >60 mL/min (ref >60)
Glucose, Bld: 156 mg/dL — ABNORMAL HIGH (ref 70–99)
Potassium: 3 mmol/L — ABNORMAL LOW (ref 3.5–5.1)
Sodium: 138 mmol/L (ref 135–145)
Total Bilirubin: 0.4 mg/dL (ref 0.3–1.2)
Total Protein: 6.5 g/dL (ref 6.5–8.1)

## 2022-11-24 MED ORDER — POTASSIUM CHLORIDE CRYS ER 20 MEQ PO TBCR
20.0000 meq | EXTENDED_RELEASE_TABLET | Freq: Every day | ORAL | 2 refills | Status: DC
Start: 2022-11-24 — End: 2022-12-08

## 2022-11-24 MED ORDER — SODIUM CHLORIDE 0.9 % IV SOLN
10.0000 mg | Freq: Once | INTRAVENOUS | Status: AC
  Administered 2022-11-24: 10 mg via INTRAVENOUS
  Filled 2022-11-24: qty 10

## 2022-11-24 MED ORDER — DEXTROSE 5 % IV SOLN: Freq: Once | INTRAVENOUS | Status: AC

## 2022-11-24 MED ORDER — LEUCOVORIN CALCIUM INJECTION 350 MG
400.0000 mg/m2 | Freq: Once | INTRAVENOUS | Status: AC
  Administered 2022-11-24: 788 mg via INTRAVENOUS
  Filled 2022-11-24: qty 39.4

## 2022-11-24 MED ORDER — PALONOSETRON HCL INJECTION 0.25 MG/5ML
0.2500 mg | Freq: Once | INTRAVENOUS | Status: AC
  Administered 2022-11-24: 0.25 mg via INTRAVENOUS
  Filled 2022-11-24: qty 5

## 2022-11-24 MED ORDER — OXALIPLATIN CHEMO INJECTION 100 MG/20ML
50.0000 mg/m2 | Freq: Once | INTRAVENOUS | Status: AC
  Administered 2022-11-24: 100 mg via INTRAVENOUS
  Filled 2022-11-24: qty 20

## 2022-11-24 MED ORDER — FLUOROURACIL CHEMO INJECTION 2.5 GM/50ML
400.0000 mg/m2 | Freq: Once | INTRAVENOUS | Status: AC
  Administered 2022-11-24: 800 mg via INTRAVENOUS
  Filled 2022-11-24: qty 16

## 2022-11-24 MED ORDER — SODIUM CHLORIDE 0.9 % IV SOLN
2400.0000 mg/m2 | INTRAVENOUS | Status: DC
  Administered 2022-11-24: 5000 mg via INTRAVENOUS
  Filled 2022-11-24: qty 100

## 2022-11-24 NOTE — Progress Notes (Signed)
  Ronald [name]   Diagnosis: Rectal cancer  INTERVAL HISTORY:   Ronald [name] returns as scheduled.  He completed cycle 5 FOLFOX 11/09/2022.  He denies nausea/vomiting.  No mouth sores.  He had loose stools "off-and-on" for 3 days after treatment.  No hand or foot pain or redness.  No numbness or tingling in the hands or feet.  Cold sensitivity lasted about 1-1/2 days.  No significant bone pain following a Udenyca injection.  Objective:  Vital signs in last 24 hours:  Blood pressure 115/62, pulse 74, temperature 98.1 F (36.7 C), temperature source Oral, resp. rate 20, height 6' (1.829 m), weight 163 lb 1.6 oz (74 kg), SpO2 100%.    HEENT: No thrush or ulcers. Resp: Lungs clear bilaterally. Cardio: Regular rate and rhythm. GI: Abdomen soft and nontender.  No hepatosplenomegaly. Vascular: No leg edema. Neuro: Vibratory sense intact over the fingertips per tuning fork exam.   Skin: Palms without erythema. Port-A-Cath without erythema.  Lab Results:  Lab Results  Component Value Date   WBC 8.1 11/24/2022   HGB 11.0 (L) 11/24/2022   HCT 32.3 (L) 11/24/2022   MCV 97.3 11/24/2022   PLT 117 (L) 11/24/2022   NEUTROABS 4.7 11/24/2022    Imaging:  No results found.  Medications: I have reviewed the patient's current medications.  Assessment/Plan: Rectal cancer Colonoscopy 07/24/2022-mass in the posterior rectum extending from the dentate line for 3 cm, biopsy-moderately differentiated invasive adenocarcinoma, mismatch repair protein expression intact CT abdomen/pelvis 04/07/2022-cardiac enlargement, hepatic steatosis mild left adrenal nodularity stable compared to chest CT March 2020, small nonobstructing bilateral renal calculi, extensive colonic diverticula CT chest 06/03/2022-6 mm nodule in the superior right lower lobe-new from 07/12/2018, new 1-2 mm nodule within the left upper lobe Elevated CEA 07/24/2022 MRI pelvis 08/06/2022-tumor at 3.5 cm from  the anal verge with involvement of the upper anal sphincter, invasion of the inferior right levator ani muscle, T4b, one 5 mm left posterior perirectal node-in 1 CTs 08/19/2022-partially circumferential mass of the low rectum.  Small bilateral pulmonary nodules slightly enlarged compared to prior examination.  Plan for follow-up CT chest after completing 6 cycles of chemotherapy. Cycle 1 FOLFOX 08/24/2022 Cycle 2 FOLFOX 09/07/2022 Chemotherapy held 09/21/2022 due to severe neutropenia, thrombocytopenia Cycle 3 FOLFOX 10/05/2022, Udenyca, oxaliplatin dose reduced secondary to thrombocytopenia following cycle 2 Treatment held 10/20/2022 due to thrombocytopenia Cycle 4 FOLFOX 10/27/2022, Udenyca, oxaliplatin dose reduced secondary to thrombocytopenia following cycle 3 Cycle 5 FOLFOX 11/09/2022, Udenyca Cycle 6 FOLFOX 11/24/2022, Udenyca   CAD, status post myocardial infarction 2015 and July 2023, D1 branch LAD angioplasty 12/23/2021 Hyperlipidemia Hypertension PTSD BPH Cigar use    Disposition: Ronald [name] appears stable.  He has completed 5 cycles of FOLFOX.  He continues to tolerate well.  Plan to proceed with cycle 6 today as scheduled.  CBC and chemistry panel reviewed.  Labs adequate to proceed as above.  He has stable mild thrombocytopenia.  He understands to contact the office with bleeding.  He has mild hypokalemia, possibly related to loose stools or chemo.  He will begin Kdur 20 mEq daily.  He will return for follow-up and cycle 7 FOLFOX in 2 weeks.  He will contact the office in the interim with any problems.    Ronald [name] ANP/GNP-BC   11/24/2022  9:39 AM

## 2022-11-24 NOTE — Patient Instructions (Signed)
[facility]   Discharge Instructions: Thank you for choosing [facility] to provide your oncology and hematology care.   If you have a lab appointment with the Cancer Center, please go directly to the Cancer Center and check in at the registration area.   Wear comfortable clothing and clothing appropriate for easy access to any Portacath or PICC line.   We strive to give you quality time with your provider. You may need to reschedule your appointment if you arrive late (15 or more minutes).  Arriving late affects you and other patients whose appointments are after yours.  Also, if you miss three or more appointments without notifying the office, you may be dismissed from the clinic at the provider's discretion.      For prescription refill requests, have your pharmacy contact our office and allow 72 hours for refills to be completed.    Today you received the following chemotherapy and/or immunotherapy agents Oxaliplatin (ELOXATIN), Leucovorin & Flourouracil (ADRUCIL).      To help prevent nausea and vomiting after your treatment, we encourage you to take your nausea medication as directed.  BELOW ARE SYMPTOMS THAT SHOULD BE REPORTED IMMEDIATELY: *FEVER GREATER THAN 100.4 F (38 C) OR HIGHER *CHILLS OR SWEATING *NAUSEA AND VOMITING THAT IS NOT CONTROLLED WITH YOUR NAUSEA MEDICATION *UNUSUAL SHORTNESS OF BREATH *UNUSUAL BRUISING OR BLEEDING *URINARY PROBLEMS (pain or burning when urinating, or frequent urination) *BOWEL PROBLEMS (unusual diarrhea, constipation, pain near the anus) TENDERNESS IN MOUTH AND THROAT WITH OR WITHOUT PRESENCE OF ULCERS (sore throat, sores in mouth, or a toothache) UNUSUAL RASH, SWELLING OR PAIN  UNUSUAL VAGINAL DISCHARGE OR ITCHING   Items with * indicate a potential emergency and should be followed up as soon as possible or go to the Emergency Department if any problems should occur.  Please show the  CHEMOTHERAPY ALERT CARD or IMMUNOTHERAPY ALERT CARD at check-in to the Emergency Department and triage nurse.  Should you have questions after your visit or need to cancel or reschedule your appointment, please contact [facility]  Dept: [phone]  and follow the prompts.  Office hours are 8:00 a.m. to 4:30 p.m. Monday - Friday. Please note that voicemails left after 4:00 p.m. may not be returned until the following business day.  We are closed weekends and major holidays. You have access to a nurse at all times for urgent questions. Please call the main number to the clinic Dept: [phone] and follow the prompts.   For any non-urgent questions, you may also contact your provider using MyChart. We now offer e-Visits for anyone 7 and older to request care online for non-urgent symptoms. For details visit [email or web address].   Also download the MyChart app! Go to the app store, search "MyChart", open the app, select , and log in with your MyChart username and password.  Oxaliplatin Injection What is this medication? OXALIPLATIN (ox AL i PLA tin) treats colorectal cancer. It works by slowing down the growth of cancer cells. This medicine may be used for other purposes; ask your health care provider or pharmacist if you have questions. COMMON BRAND NAME(S): Eloxatin What should I tell my care team before I take this medication? They need to know if you have any of these conditions: Heart disease History of irregular heartbeat or rhythm Liver disease Low blood cell levels (white cells, red cells, and platelets) Lung or breathing disease, such as asthma Take medications that treat  or prevent blood clots Tingling of the fingers, toes, or other nerve disorder An unusual or allergic reaction to oxaliplatin, other medications, foods, dyes, or preservatives If you or your partner are pregnant or trying to get pregnant Breast-feeding How should  I use this medication? This medication is injected into a vein. It is given by your care team in a hospital or clinic setting. Talk to your care team about the use of this medication in children. Special care may be needed. Overdosage: If you think you have taken too much of this medicine contact a poison control center or emergency room at once. NOTE: This medicine is only for you. Do not share this medicine with others. What if I miss a dose? Keep appointments for follow-up doses. It is important not to miss a dose. Call your care team if you are unable to keep an appointment. What may interact with this medication? Do not take this medication with any of the following: Cisapride Dronedarone Pimozide Thioridazine This medication may also interact with the following: Aspirin and aspirin-like medications Certain medications that treat or prevent blood clots, such as warfarin, apixaban, dabigatran, and rivaroxaban Cisplatin Cyclosporine Diuretics Medications for infection, such as acyclovir, adefovir, amphotericin B, bacitracin, cidofovir, foscarnet, ganciclovir, gentamicin, pentamidine, vancomycin NSAIDs, medications for pain and inflammation, such as ibuprofen or naproxen Other medications that cause heart rhythm changes Pamidronate Zoledronic acid This list may not describe all possible interactions. Give your health care provider a list of all the medicines, herbs, non-prescription drugs, or dietary supplements you use. Also tell them if you smoke, drink alcohol, or use illegal drugs. Some items may interact with your medicine. What should I watch for while using this medication? Your condition will be monitored carefully while you are receiving this medication. You may need blood work while taking this medication. This medication may make you feel generally unwell. This is not uncommon as chemotherapy can affect healthy cells as well as cancer cells. Report any side effects. Continue  your course of treatment even though you feel ill unless your care team tells you to stop. This medication may increase your risk of getting an infection. Call your care team for advice if you get a fever, chills, sore throat, or other symptoms of a cold or flu. Do not treat yourself. Try to avoid being around people who are sick. Avoid taking medications that contain aspirin, acetaminophen, ibuprofen, naproxen, or ketoprofen unless instructed by your care team. These medications may hide a fever. Be careful brushing or flossing your teeth or using a toothpick because you may get an infection or bleed more easily. If you have any dental work done, tell your dentist you are receiving this medication. This medication can make you more sensitive to cold. Do not drink cold drinks or use ice. Cover exposed skin before coming in contact with cold temperatures or cold objects. When out in cold weather wear warm clothing and cover your mouth and nose to warm the air that goes into your lungs. Tell your care team if you get sensitive to the cold. Talk to your care team if you or your partner are pregnant or think either of you might be pregnant. This medication can cause serious birth defects if taken during pregnancy and for 9 months after the last dose. A negative pregnancy test is required before starting this medication. A reliable form of contraception is recommended while taking this medication and for 9 months after the last dose. Talk to your care  team about effective forms of contraception. Do not father a child while taking this medication and for 6 months after the last dose. Use a condom while having sex during this time period. Do not breastfeed while taking this medication and for 3 months after the last dose. This medication may cause infertility. Talk to your care team if you are concerned about your fertility. What side effects may I notice from receiving this medication? Side effects that you  should report to your care team as soon as possible: Allergic reactions--skin rash, itching, hives, swelling of the face, lips, tongue, or throat Bleeding--bloody or black, tar-like stools, vomiting blood or brown material that looks like coffee grounds, red or dark brown urine, small red or purple spots on skin, unusual bruising or bleeding Dry cough, shortness of breath or trouble breathing Heart rhythm changes--fast or irregular heartbeat, dizziness, feeling faint or lightheaded, chest pain, trouble breathing Infection--fever, chills, cough, sore throat, wounds that don't heal, pain or trouble when passing urine, general feeling of discomfort or being unwell Liver injury--right upper belly pain, loss of appetite, nausea, light-colored stool, dark yellow or brown urine, yellowing skin or eyes, unusual weakness or fatigue Low red blood cell level--unusual weakness or fatigue, dizziness, headache, trouble breathing Muscle injury--unusual weakness or fatigue, muscle pain, dark yellow or brown urine, decrease in amount of urine Pain, tingling, or numbness in the hands or feet Sudden and severe headache, confusion, change in vision, seizures, which may be signs of posterior reversible encephalopathy syndrome (PRES) Unusual bruising or bleeding Side effects that usually do not require medical attention (report to your care team if they continue or are bothersome): Diarrhea Nausea Pain, redness, or swelling with sores inside the mouth or throat Unusual weakness or fatigue Vomiting This list may not describe all possible side effects. Call your doctor for medical advice about side effects. You may report side effects to FDA at 1-800-FDA-1088. Where should I keep my medication? This medication is given in a hospital or clinic. It will not be stored at home. NOTE: This sheet is a summary. It may not cover all possible information. If you have questions about this medicine, talk to your doctor,  pharmacist, or health care provider.  2024 Elsevier/Gold Standard (2022-02-03 00:00:00)  Leucovorin Injection What is this medication? LEUCOVORIN (loo koe VOR in) prevents side effects from certain medications, such as methotrexate. It works by increasing folate levels. This helps protect healthy cells in your body. It may also be used to treat anemia caused by low levels of folate. It can also be used with fluorouracil, a type of chemotherapy, to treat colorectal cancer. It works by increasing the effects of fluorouracil in the body. This medicine may be used for other purposes; ask your health care provider or pharmacist if you have questions. What should I tell my care team before I take this medication? They need to know if you have any of these conditions: Anemia from low levels of vitamin B12 in the blood An unusual or allergic reaction to leucovorin, folic acid, other medications, foods, dyes, or preservatives Pregnant or trying to get pregnant Breastfeeding How should I use this medication? This medication is injected into a vein or a muscle. It is given by your care team in a hospital or clinic setting. Talk to your care team about the use of this medication in children. Special care may be needed. Overdosage: If you think you have taken too much of this medicine contact a poison control center  or emergency room at once. NOTE: This medicine is only for you. Do not share this medicine with others. What if I miss a dose? Keep appointments for follow-up doses. It is important not to miss your dose. Call your care team if you are unable to keep an appointment. What may interact with this medication? Capecitabine Fluorouracil Phenobarbital Phenytoin Primidone Trimethoprim;sulfamethoxazole This list may not describe all possible interactions. Give your health care provider a list of all the medicines, herbs, non-prescription drugs, or dietary supplements you use. Also tell them if you  smoke, drink alcohol, or use illegal drugs. Some items may interact with your medicine. What should I watch for while using this medication? Your condition will be monitored carefully while you are receiving this medication. This medication may increase the side effects of 5-fluorouracil. Tell your care team if you have diarrhea or mouth sores that do not get better or that get worse. What side effects may I notice from receiving this medication? Side effects that you should report to your care team as soon as possible: Allergic reactions--skin rash, itching, hives, swelling of the face, lips, tongue, or throat This list may not describe all possible side effects. Call your doctor for medical advice about side effects. You may report side effects to FDA at 1-800-FDA-1088. Where should I keep my medication? This medication is given in a hospital or clinic. It will not be stored at home. NOTE: This sheet is a summary. It may not cover all possible information. If you have questions about this medicine, talk to your doctor, pharmacist, or health care provider.  2024 Elsevier/Gold Standard (2021-09-23 00:00:00)  Fluorouracil Injection What is this medication? FLUOROURACIL (flure oh YOOR a sil) treats some types of cancer. It works by slowing down the growth of cancer cells. This medicine may be used for other purposes; ask your health care provider or pharmacist if you have questions. COMMON BRAND NAME(S): Adrucil What should I tell my care team before I take this medication? They need to know if you have any of these conditions: Blood disorders Dihydropyrimidine dehydrogenase (DPD) deficiency Infection, such as chickenpox, cold sores, herpes Kidney disease Liver disease Poor nutrition Recent or ongoing radiation therapy An unusual or allergic reaction to fluorouracil, other medications, foods, dyes, or preservatives If you or your partner are pregnant or trying to get  pregnant Breast-feeding How should I use this medication? This medication is injected into a vein. It is administered by your care team in a hospital or clinic setting. Talk to your care team about the use of this medication in children. Special care may be needed. Overdosage: If you think you have taken too much of this medicine contact a poison control center or emergency room at once. NOTE: This medicine is only for you. Do not share this medicine with others. What if I miss a dose? Keep appointments for follow-up doses. It is important not to miss your dose. Call your care team if you are unable to keep an appointment. What may interact with this medication? Do not take this medication with any of the following: Live virus vaccines This medication may also interact with the following: Medications that treat or prevent blood clots, such as warfarin, enoxaparin, dalteparin This list may not describe all possible interactions. Give your health care provider a list of all the medicines, herbs, non-prescription drugs, or dietary supplements you use. Also tell them if you smoke, drink alcohol, or use illegal drugs. Some items may interact with your  medicine. What should I watch for while using this medication? Your condition will be monitored carefully while you are receiving this medication. This medication may make you feel generally unwell. This is not uncommon as chemotherapy can affect healthy cells as well as cancer cells. Report any side effects. Continue your course of treatment even though you feel ill unless your care team tells you to stop. In some cases, you may be given additional medications to help with side effects. Follow all directions for their use. This medication may increase your risk of getting an infection. Call your care team for advice if you get a fever, chills, sore throat, or other symptoms of a cold or flu. Do not treat yourself. Try to avoid being around people who are  sick. This medication may increase your risk to bruise or bleed. Call your care team if you notice any unusual bleeding. Be careful brushing or flossing your teeth or using a toothpick because you may get an infection or bleed more easily. If you have any dental work done, tell your dentist you are receiving this medication. Avoid taking medications that contain aspirin, acetaminophen, ibuprofen, naproxen, or ketoprofen unless instructed by your care team. These medications may hide a fever. Do not treat diarrhea with over the counter products. Contact your care team if you have diarrhea that lasts more than 2 days or if it is severe and watery. This medication can make you more sensitive to the sun. Keep out of the sun. If you cannot avoid being in the sun, wear protective clothing and sunscreen. Do not use sun lamps, tanning beds, or tanning booths. Talk to your care team if you or your partner wish to become pregnant or think you might be pregnant. This medication can cause serious birth defects if taken during pregnancy and for 3 months after the last dose. A reliable form of contraception is recommended while taking this medication and for 3 months after the last dose. Talk to your care team about effective forms of contraception. Do not father a child while taking this medication and for 3 months after the last dose. Use a condom while having sex during this time period. Do not breastfeed while taking this medication. This medication may cause infertility. Talk to your care team if you are concerned about your fertility. What side effects may I notice from receiving this medication? Side effects that you should report to your care team as soon as possible: Allergic reactions--skin rash, itching, hives, swelling of the face, lips, tongue, or throat Heart attack--pain or tightness in the chest, shoulders, arms, or jaw, nausea, shortness of breath, cold or clammy skin, feeling faint or  lightheaded Heart failure--shortness of breath, swelling of the ankles, feet, or hands, sudden weight gain, unusual weakness or fatigue Heart rhythm changes--fast or irregular heartbeat, dizziness, feeling faint or lightheaded, chest pain, trouble breathing High ammonia level--unusual weakness or fatigue, confusion, loss of appetite, nausea, vomiting, seizures Infection--fever, chills, cough, sore throat, wounds that don't heal, pain or trouble when passing urine, general feeling of discomfort or being unwell Low red blood cell level--unusual weakness or fatigue, dizziness, headache, trouble breathing Pain, tingling, or numbness in the hands or feet, muscle weakness, change in vision, confusion or trouble speaking, loss of balance or coordination, trouble walking, seizures Redness, swelling, and blistering of the skin over hands and feet Severe or prolonged diarrhea Unusual bruising or bleeding Side effects that usually do not require medical attention (report to your care team if  they continue or are bothersome): Dry skin Headache Increased tears Nausea Pain, redness, or swelling with sores inside the mouth or throat Sensitivity to light Vomiting This list may not describe all possible side effects. Call your doctor for medical advice about side effects. You may report side effects to FDA at 1-800-FDA-1088. Where should I keep my medication? This medication is given in a hospital or clinic. It will not be stored at home. NOTE: This sheet is a summary. It may not cover all possible information. If you have questions about this medicine, talk to your doctor, pharmacist, or health care provider.  2024 Elsevier/Gold Standard (2021-08-26 00:00:00)  The chemotherapy medication bag should finish at 46 hours, 96 hours, or 7 days. For example, if your pump is scheduled for 46 hours and it was put on at 4:00 p.m., it should finish at 2:00 p.m. the day it is scheduled to come off regardless of your  appointment time.     Estimated time to finish at 11:30 a.m. on Thursday 11/26/2022.   If the display on your pump reads "Low Volume" and it is beeping, take the batteries out of the pump and come to the cancer center for it to be taken off.   If the pump alarms go off prior to the pump reading "Low Volume" then call [phone] and someone can assist you.  If the plunger comes out and the chemotherapy medication is leaking out, please use your home chemo spill kit to clean up the spill. Do NOT use paper towels or other household products.  If you have problems or questions regarding your pump, please call either [phone] (24 hours a day) or the cancer center Monday-Friday 8:00 a.m.- 4:30 p.m. at the clinic number and we will assist you. If you are unable to get assistance, then go to the nearest Emergency Department and ask the staff to contact the IV team for assistance.

## 2022-11-24 NOTE — Progress Notes (Signed)
Patient seen by Lonna Cobb NP today  Vitals are within treatment parameters.  Labs reviewed by Lonna Cobb NP and are not all within treatment parameters. K+ 3.0--still start oral KCl. No IV needed  Per physician team, patient is ready for treatment and there are NO modifications to the treatment plan.

## 2022-11-26 ENCOUNTER — Inpatient Hospital Stay: Payer: No Typology Code available for payment source

## 2022-11-26 VITALS — BP 117/69 | HR 66 | Temp 98.1°F | Resp 20

## 2022-11-26 DIAGNOSIS — C20 Malignant neoplasm of rectum: Secondary | ICD-10-CM

## 2022-11-26 DIAGNOSIS — Z5111 Encounter for antineoplastic chemotherapy: Secondary | ICD-10-CM | POA: Diagnosis not present

## 2022-11-26 MED ORDER — PEGFILGRASTIM-CBQV 6 MG/0.6ML ~~LOC~~ SOSY
6.0000 mg | PREFILLED_SYRINGE | Freq: Once | SUBCUTANEOUS | Status: AC
  Administered 2022-11-26: 6 mg via SUBCUTANEOUS
  Filled 2022-11-26: qty 0.6

## 2022-11-26 MED ORDER — HEPARIN SOD (PORK) LOCK FLUSH 100 UNIT/ML IV SOLN
500.0000 [IU] | Freq: Once | INTRAVENOUS | Status: AC | PRN
  Administered 2022-11-26: 500 [IU]

## 2022-11-26 MED ORDER — SODIUM CHLORIDE 0.9% FLUSH
10.0000 mL | INTRAVENOUS | Status: DC | PRN
  Administered 2022-11-26: 10 mL

## 2022-12-06 ENCOUNTER — Other Ambulatory Visit: Payer: Self-pay | Admitting: Oncology

## 2022-12-06 DIAGNOSIS — C20 Malignant neoplasm of rectum: Secondary | ICD-10-CM

## 2022-12-08 ENCOUNTER — Inpatient Hospital Stay (HOSPITAL_BASED_OUTPATIENT_CLINIC_OR_DEPARTMENT_OTHER): Payer: No Typology Code available for payment source | Admitting: Oncology

## 2022-12-08 ENCOUNTER — Inpatient Hospital Stay: Payer: No Typology Code available for payment source

## 2022-12-08 ENCOUNTER — Encounter: Payer: Self-pay | Admitting: *Deleted

## 2022-12-08 ENCOUNTER — Other Ambulatory Visit: Payer: Self-pay | Admitting: *Deleted

## 2022-12-08 ENCOUNTER — Inpatient Hospital Stay: Payer: No Typology Code available for payment source | Attending: Nurse Practitioner

## 2022-12-08 VITALS — BP 121/66 | HR 80 | Temp 97.9°F | Resp 18 | Ht 72.0 in | Wt 161.4 lb

## 2022-12-08 VITALS — BP 169/79 | HR 79

## 2022-12-08 DIAGNOSIS — C20 Malignant neoplasm of rectum: Secondary | ICD-10-CM

## 2022-12-08 DIAGNOSIS — R110 Nausea: Secondary | ICD-10-CM | POA: Diagnosis not present

## 2022-12-08 DIAGNOSIS — I10 Essential (primary) hypertension: Secondary | ICD-10-CM | POA: Insufficient documentation

## 2022-12-08 DIAGNOSIS — E785 Hyperlipidemia, unspecified: Secondary | ICD-10-CM | POA: Insufficient documentation

## 2022-12-08 DIAGNOSIS — I252 Old myocardial infarction: Secondary | ICD-10-CM | POA: Insufficient documentation

## 2022-12-08 DIAGNOSIS — B400 Acute pulmonary blastomycosis: Secondary | ICD-10-CM | POA: Diagnosis not present

## 2022-12-08 DIAGNOSIS — Z5111 Encounter for antineoplastic chemotherapy: Secondary | ICD-10-CM | POA: Diagnosis present

## 2022-12-08 DIAGNOSIS — F17210 Nicotine dependence, cigarettes, uncomplicated: Secondary | ICD-10-CM | POA: Diagnosis not present

## 2022-12-08 LAB — CBC WITH DIFFERENTIAL (CANCER CENTER ONLY)
Abs Immature Granulocytes: 0.44 10*3/uL — ABNORMAL HIGH (ref 0.00–0.07)
Basophils Absolute: 0.1 10*3/uL (ref 0.0–0.1)
Basophils Relative: 1 %
Eosinophils Absolute: 0 10*3/uL (ref 0.0–0.5)
Eosinophils Relative: 0 %
HCT: 31.8 % — ABNORMAL LOW (ref 39.0–52.0)
Hemoglobin: 10.6 g/dL — ABNORMAL LOW (ref 13.0–17.0)
Immature Granulocytes: 5 %
Lymphocytes Relative: 20 %
Lymphs Abs: 1.9 10*3/uL (ref 0.7–4.0)
MCH: 32.4 pg (ref 26.0–34.0)
MCHC: 33.3 g/dL (ref 30.0–36.0)
MCV: 97.2 fL (ref 80.0–100.0)
Monocytes Absolute: 1 10*3/uL (ref 0.1–1.0)
Monocytes Relative: 10 %
Neutro Abs: 6.4 10*3/uL (ref 1.7–7.7)
Neutrophils Relative %: 64 %
Platelet Count: 91 10*3/uL — ABNORMAL LOW (ref 150–400)
RBC: 3.27 MIL/uL — ABNORMAL LOW (ref 4.22–5.81)
RDW: 15.2 % (ref 11.5–15.5)
WBC Count: 9.9 10*3/uL (ref 4.0–10.5)
nRBC: 0.2 % (ref 0.0–0.2)

## 2022-12-08 LAB — CMP (CANCER CENTER ONLY)
ALT: 17 U/L (ref 0–44)
AST: 23 U/L (ref 15–41)
Albumin: 4 g/dL (ref 3.5–5.0)
Alkaline Phosphatase: 123 U/L (ref 38–126)
Anion gap: 9 (ref 5–15)
BUN: 9 mg/dL (ref 8–23)
CO2: 24 mmol/L (ref 22–32)
Calcium: 8.7 mg/dL — ABNORMAL LOW (ref 8.9–10.3)
Chloride: 106 mmol/L (ref 98–111)
Creatinine: 0.89 mg/dL (ref 0.61–1.24)
GFR, Estimated: >60 mL/min (ref >60)
Glucose, Bld: 151 mg/dL — ABNORMAL HIGH (ref 70–99)
Potassium: 3.2 mmol/L — ABNORMAL LOW (ref 3.5–5.1)
Sodium: 139 mmol/L (ref 135–145)
Total Bilirubin: 0.4 mg/dL (ref 0.3–1.2)
Total Protein: 6.4 g/dL — ABNORMAL LOW (ref 6.5–8.1)

## 2022-12-08 LAB — MAGNESIUM: Magnesium: 1.2 mg/dL — ABNORMAL LOW (ref 1.7–2.4)

## 2022-12-08 MED ORDER — DEXTROSE 5 % IV SOLN: Freq: Once | INTRAVENOUS | Status: AC

## 2022-12-08 MED ORDER — SODIUM CHLORIDE 0.9 % IV SOLN
2400.0000 mg/m2 | INTRAVENOUS | Status: DC
  Administered 2022-12-08: 5000 mg via INTRAVENOUS
  Filled 2022-12-08: qty 100

## 2022-12-08 MED ORDER — PALONOSETRON HCL INJECTION 0.25 MG/5ML
0.2500 mg | Freq: Once | INTRAVENOUS | Status: AC
  Administered 2022-12-08: 0.25 mg via INTRAVENOUS
  Filled 2022-12-08: qty 5

## 2022-12-08 MED ORDER — POTASSIUM CHLORIDE CRYS ER 20 MEQ PO TBCR: 20.0000 meq | EXTENDED_RELEASE_TABLET | Freq: Two times a day (BID) | ORAL | 2 refills | Status: AC

## 2022-12-08 MED ORDER — OXALIPLATIN CHEMO INJECTION 100 MG/20ML
50.0000 mg/m2 | Freq: Once | INTRAVENOUS | Status: AC
  Administered 2022-12-08: 100 mg via INTRAVENOUS
  Filled 2022-12-08: qty 20

## 2022-12-08 MED ORDER — DEXAMETHASONE 4 MG PO TABS
4.0000 mg | ORAL_TABLET | Freq: Two times a day (BID) | ORAL | 1 refills | Status: DC
Start: 2022-12-08 — End: 2023-03-26

## 2022-12-08 MED ORDER — LOPERAMIDE HCL 2 MG PO TABS
2.0000 mg | ORAL_TABLET | Freq: Four times a day (QID) | ORAL | Status: AC | PRN
Start: 2022-12-08 — End: ?

## 2022-12-08 MED ORDER — MAGNESIUM SULFATE 2 GM/50ML IV SOLN
2.0000 g | Freq: Once | INTRAVENOUS | Status: AC
  Administered 2022-12-08: 2 g via INTRAVENOUS
  Filled 2022-12-08: qty 50

## 2022-12-08 MED ORDER — FLUOROURACIL CHEMO INJECTION 2.5 GM/50ML
400.0000 mg/m2 | Freq: Once | INTRAVENOUS | Status: AC
  Administered 2022-12-08: 750 mg via INTRAVENOUS
  Filled 2022-12-08: qty 15

## 2022-12-08 MED ORDER — SODIUM CHLORIDE 0.9 % IV SOLN
10.0000 mg | Freq: Once | INTRAVENOUS | Status: AC
  Administered 2022-12-08: 10 mg via INTRAVENOUS
  Filled 2022-12-08: qty 10

## 2022-12-08 MED ORDER — LEUCOVORIN CALCIUM INJECTION 350 MG
400.0000 mg/m2 | Freq: Once | INTRAVENOUS | Status: DC
  Filled 2022-12-08: qty 38.6

## 2022-12-08 MED ORDER — LEUCOVORIN CALCIUM INJECTION 350 MG
400.0000 mg/m2 | Freq: Once | INTRAVENOUS | Status: AC
  Administered 2022-12-08: 772 mg via INTRAVENOUS
  Filled 2022-12-08: qty 38.6

## 2022-12-08 NOTE — Patient Instructions (Signed)

## 2022-12-08 NOTE — Progress Notes (Signed)
[facility] OFFICE PROGRESS NOTE   Diagnosis: Rectal cancer  INTERVAL HISTORY:   Ronald. Sheils completed another cycle of FOLFOX on 11/24/2022.  He reports developing nauseous for several days beginning on day 3.  He also had several days of diarrhea beginning on day 3 or 4.  No emesis.  No cold sensitivity or peripheral neuropathy symptoms.  He developed discomfort at the "hip "approximately 1 month ago.  The pain is worse with activity.  Objective:  Vital signs in last 24 hours:  Blood pressure 121/66, pulse 80, temperature 97.9 F (36.6 C), temperature source Oral, resp. rate 18, height 6' (1.829 m), weight 161 lb 6.4 oz (73.2 kg), SpO2 98%.    HEENT: No thrush or ulcers Resp: Lungs clear bilaterally Cardio: Regular rate and rhythm GI: No hepatosplenomegaly, nontender Vascular: No leg edema Neuro: Variable mild to moderate loss of vibratory sense at the fingertips bilaterally   Portacath/PICC-without erythema  Lab Results:  Lab Results  Component Value Date   WBC 9.9 12/08/2022   HGB 10.6 (L) 12/08/2022   HCT 31.8 (L) 12/08/2022   MCV 97.2 12/08/2022   PLT 91 (L) 12/08/2022   NEUTROABS 6.4 12/08/2022    CMP  Lab Results  Component Value Date   NA 139 12/08/2022   K 3.2 (L) 12/08/2022   CL 106 12/08/2022   CO2 24 12/08/2022   GLUCOSE 151 (H) 12/08/2022   BUN 9 12/08/2022   CREATININE 0.89 12/08/2022   CALCIUM 8.7 (L) 12/08/2022   PROT 6.4 (L) 12/08/2022   ALBUMIN 4.0 12/08/2022   AST 23 12/08/2022   ALT 17 12/08/2022   ALKPHOS 123 12/08/2022   BILITOT 0.4 12/08/2022   GFRNONAA >60 12/08/2022   GFRAA 84 02/10/2019    Lab Results  Component Value Date   CEA1 23.9 (H) 07/24/2022   CEA 3.17 10/05/2022   Medications: I have reviewed the patient's current medications.   Assessment/Plan: Rectal cancer Colonoscopy 07/24/2022-mass in the posterior rectum extending from the dentate line for 3 cm, biopsy-moderately differentiated invasive  adenocarcinoma, mismatch repair protein expression intact CT abdomen/pelvis 04/07/2022-cardiac enlargement, hepatic steatosis mild left adrenal nodularity stable compared to chest CT March 2020, small nonobstructing bilateral renal calculi, extensive colonic diverticula CT chest 06/03/2022-6 mm nodule in the superior right lower lobe-new from 07/12/2018, new 1-2 mm nodule within the left upper lobe Elevated CEA 07/24/2022 MRI pelvis 08/06/2022-tumor at 3.5 cm from the anal verge with involvement of the upper anal sphincter, invasion of the inferior right levator ani muscle, T4b, one 5 mm left posterior perirectal node-in 1 CTs 08/19/2022-partially circumferential mass of the low rectum.  Small bilateral pulmonary nodules slightly enlarged compared to prior examination.  Plan for follow-up CT chest after completing 6 cycles of chemotherapy. Cycle 1 FOLFOX 08/24/2022 Cycle 2 FOLFOX 09/07/2022 Chemotherapy held 09/21/2022 due to severe neutropenia, thrombocytopenia Cycle 3 FOLFOX 10/05/2022, Udenyca, oxaliplatin dose reduced secondary to thrombocytopenia following cycle 2 Treatment held 10/20/2022 due to thrombocytopenia Cycle 4 FOLFOX 10/27/2022, Udenyca, oxaliplatin dose reduced secondary to thrombocytopenia following cycle 3 Cycle 5 FOLFOX 11/09/2022, Udenyca Cycle 6 FOLFOX 11/24/2022, Udenyca Cycle 7 FOLFOX 12/08/2022, Udenyca   CAD, status post myocardial infarction 2015 and July 2023, D1 branch LAD angioplasty 12/23/2021 Hyperlipidemia Hypertension PTSD BPH Cigar use      Disposition: Ronald [name] has completed 6 locks of FOLFOX.  He has tolerated the chemotherapy well.  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  He will complete cycle 7 FOLFOX today.  Chemotherapy will be dose adjusted for his weight loss.  He will undergo restaging CT evaluation after this cycle.  Ronald Vastine had delayed nausea following the recent cycle of chemotherapy.  He will take Decadron prophylaxis with this  cycle.  He will increase potassium supplement to twice daily.  He will receive intravenous magnesium supplementation today.  He will return for an office visit in 2 weeks.  The plan is to refer him for radiation and concurrent capecitabine after cycle 8.  Thornton Papas, MD  12/08/2022  10:13 AM

## 2022-12-08 NOTE — Progress Notes (Signed)
Patient seen by Dr. Truett Perna today  Vitals are within treatment parameters.  Labs reviewed by Dr. Truett Perna and are not all within treatment parameters. Platelets 91,000--ok to proceed; K+ 3.2--will increase oral K+ to bid. Requested Mg+ level today--pending  Per physician team, patient is ready for treatment and there are NO modifications to the treatment plan.  OK to give premeds while waiting for Mg+ results

## 2022-12-08 NOTE — Patient Instructions (Addendum)
[place] [facility]   Discharge Instructions: Thank you for choosing [facility] to provide your oncology and hematology care.   If you have a lab appointment with the Cancer Center, please go directly to the Cancer Center and check in at the registration area.   Wear comfortable clothing and clothing appropriate for easy access to any Portacath or PICC line.   We strive to give you quality time with your provider. You may need to reschedule your appointment if you arrive late (15 or more minutes).  Arriving late affects you and other patients whose appointments are after yours.  Also, if you miss three or more appointments without notifying the office, you may be dismissed from the clinic at the provider's discretion.      For prescription refill requests, have your pharmacy contact our office and allow 72 hours for refills to be completed.    Today you received the following chemotherapy and/or immunotherapy agents Oxaliplatin (ELOXATIN), Leucovorin & Flourouracil (ADRUCIL).      To help prevent nausea and vomiting after your treatment, we encourage you to take your nausea medication as directed.  BELOW ARE SYMPTOMS THAT SHOULD BE REPORTED IMMEDIATELY: *FEVER GREATER THAN 100.4 F (38 C) OR HIGHER *CHILLS OR SWEATING *NAUSEA AND VOMITING THAT IS NOT CONTROLLED WITH YOUR NAUSEA MEDICATION *UNUSUAL SHORTNESS OF BREATH *UNUSUAL BRUISING OR BLEEDING *URINARY PROBLEMS (pain or burning when urinating, or frequent urination) *BOWEL PROBLEMS (unusual diarrhea, constipation, pain near the anus) TENDERNESS IN MOUTH AND THROAT WITH OR WITHOUT PRESENCE OF ULCERS (sore throat, sores in mouth, or a toothache) UNUSUAL RASH, SWELLING OR PAIN  UNUSUAL VAGINAL DISCHARGE OR ITCHING   Items with * indicate a potential emergency and should be followed up as soon as possible or go to the Emergency Department if any problems should occur.  Please show the  CHEMOTHERAPY ALERT CARD or IMMUNOTHERAPY ALERT CARD at check-in to the Emergency Department and triage nurse.  Should you have questions after your visit or need to cancel or reschedule your appointment, please contact [facility]  Dept: [phone]  and follow the prompts.  Office hours are 8:00 a.m. to 4:30 p.m. Monday - Friday. Please note that voicemails left after 4:00 p.m. may not be returned until the following business day.  We are closed weekends and major holidays. You have access to a nurse at all times for urgent questions. Please call the main number to the clinic Dept: [phone] and follow the prompts.   For any non-urgent questions, you may also contact your provider using MyChart. We now offer e-Visits for anyone 61 and older to request care online for non-urgent symptoms. For details visit [email or web address].   Also download the MyChart app! Go to the app store, search "MyChart", open the app, select [place], and log in with your MyChart username and password.  Oxaliplatin Injection What is this medication? OXALIPLATIN (ox AL i PLA tin) treats colorectal cancer. It works by slowing down the growth of cancer cells. This medicine may be used for other purposes; ask your health care provider or pharmacist if you have questions. COMMON BRAND NAME(S): Eloxatin What should I tell my care team before I take this medication? They need to know if you have any of these conditions: Heart disease History of irregular heartbeat or rhythm Liver disease Low blood cell levels (white cells, red cells, and platelets) Lung or breathing disease, such as asthma Take medications that treat  or prevent blood clots Tingling of the fingers, toes, or other nerve disorder An unusual or allergic reaction to oxaliplatin, other medications, foods, dyes, or preservatives If you or your partner are pregnant or trying to get pregnant Breast-feeding How should  I use this medication? This medication is injected into a vein. It is given by your care team in a hospital or clinic setting. Talk to your care team about the use of this medication in children. Special care may be needed. Overdosage: If you think you have taken too much of this medicine contact a poison control center or emergency room at once. NOTE: This medicine is only for you. Do not share this medicine with others. What if I miss a dose? Keep appointments for follow-up doses. It is important not to miss a dose. Call your care team if you are unable to keep an appointment. What may interact with this medication? Do not take this medication with any of the following: Cisapride Dronedarone Pimozide Thioridazine This medication may also interact with the following: Aspirin and aspirin-like medications Certain medications that treat or prevent blood clots, such as warfarin, apixaban, dabigatran, and rivaroxaban Cisplatin Cyclosporine Diuretics Medications for infection, such as acyclovir, adefovir, amphotericin B, bacitracin, cidofovir, foscarnet, ganciclovir, gentamicin, pentamidine, vancomycin NSAIDs, medications for pain and inflammation, such as ibuprofen or naproxen Other medications that cause heart rhythm changes Pamidronate Zoledronic acid This list may not describe all possible interactions. Give your health care provider a list of all the medicines, herbs, non-prescription drugs, or dietary supplements you use. Also tell them if you smoke, drink alcohol, or use illegal drugs. Some items may interact with your medicine. What should I watch for while using this medication? Your condition will be monitored carefully while you are receiving this medication. You may need blood work while taking this medication. This medication may make you feel generally unwell. This is not uncommon as chemotherapy can affect healthy cells as well as cancer cells. Report any side effects. Continue  your course of treatment even though you feel ill unless your care team tells you to stop. This medication may increase your risk of getting an infection. Call your care team for advice if you get a fever, chills, sore throat, or other symptoms of a cold or flu. Do not treat yourself. Try to avoid being around people who are sick. Avoid taking medications that contain aspirin, acetaminophen, ibuprofen, naproxen, or ketoprofen unless instructed by your care team. These medications may hide a fever. Be careful brushing or flossing your teeth or using a toothpick because you may get an infection or bleed more easily. If you have any dental work done, tell your dentist you are receiving this medication. This medication can make you more sensitive to cold. Do not drink cold drinks or use ice. Cover exposed skin before coming in contact with cold temperatures or cold objects. When out in cold weather wear warm clothing and cover your mouth and nose to warm the air that goes into your lungs. Tell your care team if you get sensitive to the cold. Talk to your care team if you or your partner are pregnant or think either of you might be pregnant. This medication can cause serious birth defects if taken during pregnancy and for 9 months after the last dose. A negative pregnancy test is required before starting this medication. A reliable form of contraception is recommended while taking this medication and for 9 months after the last dose. Talk to your care  team about effective forms of contraception. Do not father a child while taking this medication and for 6 months after the last dose. Use a condom while having sex during this time period. Do not breastfeed while taking this medication and for 3 months after the last dose. This medication may cause infertility. Talk to your care team if you are concerned about your fertility. What side effects may I notice from receiving this medication? Side effects that you  should report to your care team as soon as possible: Allergic reactions--skin rash, itching, hives, swelling of the face, lips, tongue, or throat Bleeding--bloody or black, tar-like stools, vomiting blood or brown material that looks like coffee grounds, red or dark brown urine, small red or purple spots on skin, unusual bruising or bleeding Dry cough, shortness of breath or trouble breathing Heart rhythm changes--fast or irregular heartbeat, dizziness, feeling faint or lightheaded, chest pain, trouble breathing Infection--fever, chills, cough, sore throat, wounds that don't heal, pain or trouble when passing urine, general feeling of discomfort or being unwell Liver injury--right upper belly pain, loss of appetite, nausea, light-colored stool, dark yellow or brown urine, yellowing skin or eyes, unusual weakness or fatigue Low red blood cell level--unusual weakness or fatigue, dizziness, headache, trouble breathing Muscle injury--unusual weakness or fatigue, muscle pain, dark yellow or brown urine, decrease in amount of urine Pain, tingling, or numbness in the hands or feet Sudden and severe headache, confusion, change in vision, seizures, which may be signs of posterior reversible encephalopathy syndrome (PRES) Unusual bruising or bleeding Side effects that usually do not require medical attention (report to your care team if they continue or are bothersome): Diarrhea Nausea Pain, redness, or swelling with sores inside the mouth or throat Unusual weakness or fatigue Vomiting This list may not describe all possible side effects. Call your doctor for medical advice about side effects. You may report side effects to FDA at 1-800-FDA-1088. Where should I keep my medication? This medication is given in a hospital or clinic. It will not be stored at home. NOTE: This sheet is a summary. It may not cover all possible information. If you have questions about this medicine, talk to your doctor,  pharmacist, or health care provider.  2024 Elsevier/Gold Standard (2022-02-03 00:00:00)  Leucovorin Injection What is this medication? LEUCOVORIN (loo koe VOR in) prevents side effects from certain medications, such as methotrexate. It works by increasing folate levels. This helps protect healthy cells in your body. It may also be used to treat anemia caused by low levels of folate. It can also be used with fluorouracil, a type of chemotherapy, to treat colorectal cancer. It works by increasing the effects of fluorouracil in the body. This medicine may be used for other purposes; ask your health care provider or pharmacist if you have questions. What should I tell my care team before I take this medication? They need to know if you have any of these conditions: Anemia from low levels of vitamin B12 in the blood An unusual or allergic reaction to leucovorin, folic acid, other medications, foods, dyes, or preservatives Pregnant or trying to get pregnant Breastfeeding How should I use this medication? This medication is injected into a vein or a muscle. It is given by your care team in a hospital or clinic setting. Talk to your care team about the use of this medication in children. Special care may be needed. Overdosage: If you think you have taken too much of this medicine contact a poison control center  or emergency room at once. NOTE: This medicine is only for you. Do not share this medicine with others. What if I miss a dose? Keep appointments for follow-up doses. It is important not to miss your dose. Call your care team if you are unable to keep an appointment. What may interact with this medication? Capecitabine Fluorouracil Phenobarbital Phenytoin Primidone Trimethoprim;sulfamethoxazole This list may not describe all possible interactions. Give your health care provider a list of all the medicines, herbs, non-prescription drugs, or dietary supplements you use. Also tell them if you  smoke, drink alcohol, or use illegal drugs. Some items may interact with your medicine. What should I watch for while using this medication? Your condition will be monitored carefully while you are receiving this medication. This medication may increase the side effects of 5-fluorouracil. Tell your care team if you have diarrhea or mouth sores that do not get better or that get worse. What side effects may I notice from receiving this medication? Side effects that you should report to your care team as soon as possible: Allergic reactions--skin rash, itching, hives, swelling of the face, lips, tongue, or throat This list may not describe all possible side effects. Call your doctor for medical advice about side effects. You may report side effects to FDA at 1-800-FDA-1088. Where should I keep my medication? This medication is given in a hospital or clinic. It will not be stored at home. NOTE: This sheet is a summary. It may not cover all possible information. If you have questions about this medicine, talk to your doctor, pharmacist, or health care provider.  2024 Elsevier/Gold Standard (2021-09-23 00:00:00)  Fluorouracil Injection What is this medication? FLUOROURACIL (flure oh YOOR a sil) treats some types of cancer. It works by slowing down the growth of cancer cells. This medicine may be used for other purposes; ask your health care provider or pharmacist if you have questions. COMMON BRAND NAME(S): Adrucil What should I tell my care team before I take this medication? They need to know if you have any of these conditions: Blood disorders Dihydropyrimidine dehydrogenase (DPD) deficiency Infection, such as chickenpox, cold sores, herpes Kidney disease Liver disease Poor nutrition Recent or ongoing radiation therapy An unusual or allergic reaction to fluorouracil, other medications, foods, dyes, or preservatives If you or your partner are pregnant or trying to get  pregnant Breast-feeding How should I use this medication? This medication is injected into a vein. It is administered by your care team in a hospital or clinic setting. Talk to your care team about the use of this medication in children. Special care may be needed. Overdosage: If you think you have taken too much of this medicine contact a poison control center or emergency room at once. NOTE: This medicine is only for you. Do not share this medicine with others. What if I miss a dose? Keep appointments for follow-up doses. It is important not to miss your dose. Call your care team if you are unable to keep an appointment. What may interact with this medication? Do not take this medication with any of the following: Live virus vaccines This medication may also interact with the following: Medications that treat or prevent blood clots, such as warfarin, enoxaparin, dalteparin This list may not describe all possible interactions. Give your health care provider a list of all the medicines, herbs, non-prescription drugs, or dietary supplements you use. Also tell them if you smoke, drink alcohol, or use illegal drugs. Some items may interact with your  medicine. What should I watch for while using this medication? Your condition will be monitored carefully while you are receiving this medication. This medication may make you feel generally unwell. This is not uncommon as chemotherapy can affect healthy cells as well as cancer cells. Report any side effects. Continue your course of treatment even though you feel ill unless your care team tells you to stop. In some cases, you may be given additional medications to help with side effects. Follow all directions for their use. This medication may increase your risk of getting an infection. Call your care team for advice if you get a fever, chills, sore throat, or other symptoms of a cold or flu. Do not treat yourself. Try to avoid being around people who are  sick. This medication may increase your risk to bruise or bleed. Call your care team if you notice any unusual bleeding. Be careful brushing or flossing your teeth or using a toothpick because you may get an infection or bleed more easily. If you have any dental work done, tell your dentist you are receiving this medication. Avoid taking medications that contain aspirin, acetaminophen, ibuprofen, naproxen, or ketoprofen unless instructed by your care team. These medications may hide a fever. Do not treat diarrhea with over the counter products. Contact your care team if you have diarrhea that lasts more than 2 days or if it is severe and watery. This medication can make you more sensitive to the sun. Keep out of the sun. If you cannot avoid being in the sun, wear protective clothing and sunscreen. Do not use sun lamps, tanning beds, or tanning booths. Talk to your care team if you or your partner wish to become pregnant or think you might be pregnant. This medication can cause serious birth defects if taken during pregnancy and for 3 months after the last dose. A reliable form of contraception is recommended while taking this medication and for 3 months after the last dose. Talk to your care team about effective forms of contraception. Do not father a child while taking this medication and for 3 months after the last dose. Use a condom while having sex during this time period. Do not breastfeed while taking this medication. This medication may cause infertility. Talk to your care team if you are concerned about your fertility. What side effects may I notice from receiving this medication? Side effects that you should report to your care team as soon as possible: Allergic reactions--skin rash, itching, hives, swelling of the face, lips, tongue, or throat Heart attack--pain or tightness in the chest, shoulders, arms, or jaw, nausea, shortness of breath, cold or clammy skin, feeling faint or  lightheaded Heart failure--shortness of breath, swelling of the ankles, feet, or hands, sudden weight gain, unusual weakness or fatigue Heart rhythm changes--fast or irregular heartbeat, dizziness, feeling faint or lightheaded, chest pain, trouble breathing High ammonia level--unusual weakness or fatigue, confusion, loss of appetite, nausea, vomiting, seizures Infection--fever, chills, cough, sore throat, wounds that don't heal, pain or trouble when passing urine, general feeling of discomfort or being unwell Low red blood cell level--unusual weakness or fatigue, dizziness, headache, trouble breathing Pain, tingling, or numbness in the hands or feet, muscle weakness, change in vision, confusion or trouble speaking, loss of balance or coordination, trouble walking, seizures Redness, swelling, and blistering of the skin over hands and feet Severe or prolonged diarrhea Unusual bruising or bleeding Side effects that usually do not require medical attention (report to your care team if  they continue or are bothersome): Dry skin Headache Increased tears Nausea Pain, redness, or swelling with sores inside the mouth or throat Sensitivity to light Vomiting This list may not describe all possible side effects. Call your doctor for medical advice about side effects. You may report side effects to FDA at 1-800-FDA-1088. Where should I keep my medication? This medication is given in a hospital or clinic. It will not be stored at home. NOTE: This sheet is a summary. It may not cover all possible information. If you have questions about this medicine, talk to your doctor, pharmacist, or health care provider.  2024 Elsevier/Gold Standard (2021-08-26 00:00:00)  The chemotherapy medication bag should finish at 46 hours, 96 hours, or 7 days. For example, if your pump is scheduled for 46 hours and it was put on at 4:00 p.m., it should finish at 2:00 p.m. the day it is scheduled to come off regardless of your  appointment time.     Estimated time to finish at 1:30pm on Thursday 12/10/2022.   If the display on your pump reads "Low Volume" and it is beeping, take the batteries out of the pump and come to the cancer center for it to be taken off.   If the pump alarms go off prior to the pump reading "Low Volume" then call [phone] and someone can assist you.  If the plunger comes out and the chemotherapy medication is leaking out, please use your home chemo spill kit to clean up the spill. Do NOT use paper towels or other household products.  If you have problems or questions regarding your pump, please call either [phone] (24 hours a day) or the cancer center Monday-Friday 8:00 a.m.- 4:30 p.m. at the clinic number and we will assist you. If you are unable to get assistance, then go to the nearest Emergency Department and ask the staff to contact the IV team for assistance.

## 2022-12-10 ENCOUNTER — Inpatient Hospital Stay: Payer: No Typology Code available for payment source

## 2022-12-10 VITALS — BP 111/59 | HR 60 | Temp 97.6°F | Resp 18

## 2022-12-10 DIAGNOSIS — C20 Malignant neoplasm of rectum: Secondary | ICD-10-CM

## 2022-12-10 DIAGNOSIS — Z5111 Encounter for antineoplastic chemotherapy: Secondary | ICD-10-CM | POA: Diagnosis not present

## 2022-12-10 MED ORDER — HEPARIN SOD (PORK) LOCK FLUSH 100 UNIT/ML IV SOLN
500.0000 [IU] | Freq: Once | INTRAVENOUS | Status: AC | PRN
  Administered 2022-12-10: 500 [IU]

## 2022-12-10 MED ORDER — PEGFILGRASTIM-CBQV 6 MG/0.6ML ~~LOC~~ SOSY
6.0000 mg | PREFILLED_SYRINGE | Freq: Once | SUBCUTANEOUS | Status: AC
  Administered 2022-12-10: 6 mg via SUBCUTANEOUS
  Filled 2022-12-10: qty 0.6

## 2022-12-10 MED ORDER — SODIUM CHLORIDE 0.9% FLUSH
10.0000 mL | INTRAVENOUS | Status: DC | PRN
  Administered 2022-12-10: 10 mL

## 2022-12-11 ENCOUNTER — Telehealth: Payer: Self-pay | Admitting: *Deleted

## 2022-12-11 NOTE — Telephone Encounter (Signed)
LVM with following message: CT scan at [facility] on 12/17/22 at 11:00--arrive at 0845 to start oral contrast at 0900. Come to Cancer Center at 0815 to access your port for the scan. OK to drink liquids prior to scan. Requested a return call or return MyChart message to confirm receipt. Also sent above message on MyChart.

## 2022-12-14 NOTE — Telephone Encounter (Signed)
Left 2nd VM for patient re:CT scan and port access for 12/17/22.

## 2022-12-15 ENCOUNTER — Other Ambulatory Visit: Payer: Self-pay

## 2022-12-15 ENCOUNTER — Telehealth: Payer: Self-pay

## 2022-12-15 ENCOUNTER — Inpatient Hospital Stay: Payer: No Typology Code available for payment source

## 2022-12-15 ENCOUNTER — Inpatient Hospital Stay (HOSPITAL_BASED_OUTPATIENT_CLINIC_OR_DEPARTMENT_OTHER): Payer: No Typology Code available for payment source | Admitting: Nurse Practitioner

## 2022-12-15 ENCOUNTER — Encounter: Payer: Self-pay | Admitting: Nurse Practitioner

## 2022-12-15 DIAGNOSIS — C20 Malignant neoplasm of rectum: Secondary | ICD-10-CM

## 2022-12-15 DIAGNOSIS — Z5111 Encounter for antineoplastic chemotherapy: Secondary | ICD-10-CM | POA: Diagnosis not present

## 2022-12-15 LAB — CBC WITH DIFFERENTIAL (CANCER CENTER ONLY)
Abs Immature Granulocytes: 0.4 10*3/uL — ABNORMAL HIGH (ref 0.00–0.07)
Basophils Absolute: 0 10*3/uL (ref 0.0–0.1)
Basophils Relative: 0 %
Eosinophils Absolute: 0 10*3/uL (ref 0.0–0.5)
Eosinophils Relative: 0 %
HCT: 34.2 % — ABNORMAL LOW (ref 39.0–52.0)
Hemoglobin: 11.7 g/dL — ABNORMAL LOW (ref 13.0–17.0)
Immature Granulocytes: 2 %
Lymphocytes Relative: 11 %
Lymphs Abs: 2.3 10*3/uL (ref 0.7–4.0)
MCH: 33.1 pg (ref 26.0–34.0)
MCHC: 34.2 g/dL (ref 30.0–36.0)
MCV: 96.6 fL (ref 80.0–100.0)
Monocytes Absolute: 0.5 10*3/uL (ref 0.1–1.0)
Monocytes Relative: 2 %
Neutro Abs: 17.3 10*3/uL — ABNORMAL HIGH (ref 1.7–7.7)
Neutrophils Relative %: 85 %
Platelet Count: 114 10*3/uL — ABNORMAL LOW (ref 150–400)
RBC: 3.54 MIL/uL — ABNORMAL LOW (ref 4.22–5.81)
RDW: 14.7 % (ref 11.5–15.5)
WBC Count: 20.5 10*3/uL — ABNORMAL HIGH (ref 4.0–10.5)
nRBC: 0 % (ref 0.0–0.2)

## 2022-12-15 LAB — CMP (CANCER CENTER ONLY)
ALT: 22 U/L (ref 0–44)
AST: 20 U/L (ref 15–41)
Albumin: 3.8 g/dL (ref 3.5–5.0)
Alkaline Phosphatase: 165 U/L — ABNORMAL HIGH (ref 38–126)
Anion gap: 6 (ref 5–15)
BUN: 23 mg/dL (ref 8–23)
CO2: 22 mmol/L (ref 22–32)
Calcium: 8.3 mg/dL — ABNORMAL LOW (ref 8.9–10.3)
Chloride: 106 mmol/L (ref 98–111)
Creatinine: 0.82 mg/dL (ref 0.61–1.24)
GFR, Estimated: >60 mL/min (ref >60)
Glucose, Bld: 121 mg/dL — ABNORMAL HIGH (ref 70–99)
Potassium: 4.1 mmol/L (ref 3.5–5.1)
Sodium: 134 mmol/L — ABNORMAL LOW (ref 135–145)
Total Bilirubin: 1.1 mg/dL (ref 0.3–1.2)
Total Protein: 6.2 g/dL — ABNORMAL LOW (ref 6.5–8.1)

## 2022-12-15 LAB — MAGNESIUM: Magnesium: 1.8 mg/dL (ref 1.7–2.4)

## 2022-12-15 MED ORDER — SODIUM CHLORIDE 0.9 % IV SOLN: INTRAVENOUS | Status: DC

## 2022-12-15 MED ORDER — HEPARIN SOD (PORK) LOCK FLUSH 100 UNIT/ML IV SOLN
500.0000 [IU] | Freq: Once | INTRAVENOUS | Status: AC
  Administered 2022-12-15: 500 [IU] via INTRAVENOUS

## 2022-12-15 MED ORDER — SODIUM CHLORIDE 0.9% FLUSH
10.0000 mL | Freq: Once | INTRAVENOUS | Status: AC
  Administered 2022-12-15: 10 mL via INTRAVENOUS

## 2022-12-15 NOTE — Progress Notes (Signed)
  [facility] OFFICE PROGRESS NOTE   Diagnosis: Rectal cancer  INTERVAL HISTORY:   [name] returns prior to scheduled follow-up for evaluation of diarrhea.  He completed cycle 7 FOLFOX 12/08/2022.  Around 12/10/2022 he began having frequent watery bowel movements.  The diarrhea responded to Imodium but would recur if he discontinued Imodium.  No bloody or black bowel movements.  No fever.  He has also had nausea, poor intake.  No mouth sores.  No hand or foot pain or redness.  Objective:  Vital signs in last 24 hours:  Temperature 98.1, heart rate 95, respirations 20, blood pressure 122/73, weight 148 pounds    HEENT: No thrush or ulcers. Resp: Lungs clear bilaterally. Cardio: Regular rate and rhythm. GI: Abdomen soft and nontender. Vascular: No leg edema. Neuro: Alert and oriented. Skin: Decreased skin turgor. Port-A-Cath without erythema.  Lab Results:  Lab Results  Component Value Date   WBC 9.9 12/08/2022   HGB 10.6 (L) 12/08/2022   HCT 31.8 (L) 12/08/2022   MCV 97.2 12/08/2022   PLT 91 (L) 12/08/2022   NEUTROABS 6.4 12/08/2022    Imaging:  No results found.  Medications: I have reviewed the patient's current medications.  Assessment/Plan: Rectal cancer Colonoscopy 07/24/2022-mass in the posterior rectum extending from the dentate line for 3 cm, biopsy-moderately differentiated invasive adenocarcinoma, mismatch repair protein expression intact CT abdomen/pelvis 04/07/2022-cardiac enlargement, hepatic steatosis mild left adrenal nodularity stable compared to chest CT March 2020, small nonobstructing bilateral renal calculi, extensive colonic diverticula CT chest 06/03/2022-6 mm nodule in the superior right lower lobe-new from 07/12/2018, new 1-2 mm nodule within the left upper lobe Elevated CEA 07/24/2022 MRI pelvis 08/06/2022-tumor at 3.5 cm from the anal verge with involvement of the upper anal sphincter, invasion of the inferior right levator ani muscle,  T4b, one 5 mm left posterior perirectal node-in 1 CTs 08/19/2022-partially circumferential mass of the low rectum.  Small bilateral pulmonary nodules slightly enlarged compared to prior examination.  Plan for follow-up CT chest after completing 6 cycles of chemotherapy. Cycle 1 FOLFOX 08/24/2022 Cycle 2 FOLFOX 09/07/2022 Chemotherapy held 09/21/2022 due to severe neutropenia, thrombocytopenia Cycle 3 FOLFOX 10/05/2022, Udenyca, oxaliplatin dose reduced secondary to thrombocytopenia following cycle 2 Treatment held 10/20/2022 due to thrombocytopenia Cycle 4 FOLFOX 10/27/2022, Udenyca, oxaliplatin dose reduced secondary to thrombocytopenia following cycle 3 Cycle 5 FOLFOX 11/09/2022, Udenyca Cycle 6 FOLFOX 11/24/2022, Udenyca Cycle 7 FOLFOX 12/08/2022, Udenyca   CAD, status post myocardial infarction 2015 and July 2023, D1 branch LAD angioplasty 12/23/2021 Hyperlipidemia Hypertension PTSD BPH Cigar use  Disposition: [name].  He completed cycle 7 FOLFOX 12/08/2022.  He presents today with diarrhea and nausea.  Symptoms are likely related to chemotherapy.  He will submit a stool specimen to test for C. difficile.  He appears dehydrated.  We will check baseline labs.  Plan for IV fluids today and tomorrow.    Ronald [name] ANP/GNP-BC   12/15/2022  2:47 PM

## 2022-12-15 NOTE — Progress Notes (Signed)
Pt here for IVF's due to inability to eat and drink. Noted that patient's weight has dropped from 161.6.4 on 12/08/2022 to 148.5 today. Notified Sydnee Cabal, LPN. Per Ladean Raya Dr. Truett Perna and/or Lonna Cobb, NP will come to infusion room to see patient.

## 2022-12-15 NOTE — Patient Instructions (Signed)

## 2022-12-15 NOTE — Telephone Encounter (Signed)
The patient called and reported experiencing significant weakness and a lack of appetite over the past few days, during which he had diarrhea; however, he has not had any diarrhea today. He mentioned that everything tastes unpleasant. Dr. Truett Perna has recommended that the patient come in for intravenous fluids (IVF).

## 2022-12-16 ENCOUNTER — Other Ambulatory Visit: Payer: Self-pay

## 2022-12-16 ENCOUNTER — Ambulatory Visit (HOSPITAL_BASED_OUTPATIENT_CLINIC_OR_DEPARTMENT_OTHER)
Admission: RE | Admit: 2022-12-16 | Discharge: 2022-12-16 | Disposition: A | Discharge status: Home / Self Care | Payer: No Typology Code available for payment source | Source: Ambulatory Visit | Attending: Oncology | Admitting: Oncology

## 2022-12-16 ENCOUNTER — Encounter (HOSPITAL_BASED_OUTPATIENT_CLINIC_OR_DEPARTMENT_OTHER): Payer: Self-pay

## 2022-12-16 ENCOUNTER — Inpatient Hospital Stay: Payer: No Typology Code available for payment source

## 2022-12-16 DIAGNOSIS — C20 Malignant neoplasm of rectum: Secondary | ICD-10-CM

## 2022-12-16 DIAGNOSIS — Z5111 Encounter for antineoplastic chemotherapy: Secondary | ICD-10-CM | POA: Diagnosis not present

## 2022-12-16 MED ORDER — IOHEXOL 300 MG/ML  SOLN
100.0000 mL | Freq: Once | INTRAMUSCULAR | Status: AC | PRN
  Administered 2022-12-16: 85 mL via INTRAVENOUS

## 2022-12-16 MED ORDER — SODIUM CHLORIDE 0.9% FLUSH
10.0000 mL | INTRAVENOUS | Status: AC | PRN
  Administered 2022-12-16: 10 mL

## 2022-12-16 MED ORDER — HEPARIN SOD (PORK) LOCK FLUSH 100 UNIT/ML IV SOLN: 500.0000 [IU] | INTRAVENOUS | Status: DC | PRN

## 2022-12-16 MED ORDER — SODIUM CHLORIDE 0.9 % IV SOLN: INTRAVENOUS | Status: DC

## 2022-12-16 MED ORDER — HEPARIN SOD (PORK) LOCK FLUSH 100 UNIT/ML IV SOLN
500.0000 [IU] | Freq: Once | INTRAVENOUS | Status: AC
  Administered 2022-12-16: 500 [IU] via INTRAVENOUS

## 2022-12-16 NOTE — Patient Instructions (Signed)

## 2022-12-17 ENCOUNTER — Inpatient Hospital Stay: Payer: No Typology Code available for payment source

## 2022-12-17 ENCOUNTER — Ambulatory Visit (HOSPITAL_BASED_OUTPATIENT_CLINIC_OR_DEPARTMENT_OTHER): Payer: No Typology Code available for payment source

## 2022-12-18 ENCOUNTER — Telehealth: Payer: Self-pay

## 2022-12-18 NOTE — Telephone Encounter (Signed)
The patient reports that he has his diarrhea under control and is taking Imodium. He is staying well-hydrated but feels very fatigued. He wishes to cancel his chemotherapy appointment for next week and would prefer to meet with the provider instead.

## 2022-12-18 NOTE — Telephone Encounter (Signed)
-----   Message from Lonna Cobb sent at 12/17/2022  4:03 PM EDT ----- Please let him know stool is negative for cdiff.  Is he still having diarrhea?

## 2022-12-20 ENCOUNTER — Other Ambulatory Visit: Payer: Self-pay | Admitting: Oncology

## 2022-12-21 ENCOUNTER — Inpatient Hospital Stay: Payer: No Typology Code available for payment source

## 2022-12-21 ENCOUNTER — Inpatient Hospital Stay: Payer: No Typology Code available for payment source | Admitting: Oncology

## 2022-12-22 ENCOUNTER — Inpatient Hospital Stay: Payer: No Typology Code available for payment source | Admitting: Nutrition

## 2022-12-22 NOTE — Progress Notes (Signed)
Nutrition follow-up completed with patient over the phone.  Patient diagnosed with rectal cancer and followed by Dr. Truett Perna.  Patient receiving FOLFOX q. 14 days.  Weight on last nutrition visit documented as 169 pounds in April 2024.  Weight documented as 161 pounds 6 ounces August 6 decreased to 148 pounds 8 ounces on August 13 and improved to 152 pounds 8 ounces on August 14.  Noted patient experiencing diarrhea.  He was C. difficile negative.  Complained of increased fatigue.  States weight loss was a result of diarrhea and poor oral intake with taste alterations.  Patient reports diarrhea has improved and he is eating more.  States his taste buds have improved and food taste better.  This has made a big impact on his ability to consume adequate calories and protein.  Currently reports he feels great.  He acknowledges "in person" appointment with MD tomorrow.  Nutrition diagnosis: Food and nutrition related knowledge deficit, ongoing.  Intervention: Educated to continue small frequent meals and snacks to promote weight stabilization. Continue strategies to minimize diarrhea as needed. Encourage patient to contact RD if he develops future nutrition impact symptoms.  Monitoring, evaluation, goals: Patient will tolerate adequate calories and protein to minimize weight loss.  Next visit: To be scheduled as needed.  **Disclaimer: This note was dictated with voice recognition software. Similar sounding words can inadvertently be transcribed and this note may contain transcription errors which may not have been corrected upon publication of note.**

## 2022-12-23 ENCOUNTER — Inpatient Hospital Stay: Payer: No Typology Code available for payment source

## 2022-12-23 ENCOUNTER — Inpatient Hospital Stay (HOSPITAL_BASED_OUTPATIENT_CLINIC_OR_DEPARTMENT_OTHER): Payer: No Typology Code available for payment source | Admitting: Nurse Practitioner

## 2022-12-23 ENCOUNTER — Encounter: Payer: Self-pay | Admitting: Nurse Practitioner

## 2022-12-23 VITALS — BP 110/62 | HR 81 | Temp 98.2°F | Resp 18 | Wt 157.5 lb

## 2022-12-23 DIAGNOSIS — Z5111 Encounter for antineoplastic chemotherapy: Secondary | ICD-10-CM | POA: Diagnosis not present

## 2022-12-23 DIAGNOSIS — C20 Malignant neoplasm of rectum: Secondary | ICD-10-CM

## 2022-12-23 DIAGNOSIS — Z95828 Presence of other vascular implants and grafts: Secondary | ICD-10-CM

## 2022-12-23 LAB — CEA (ACCESS): CEA (CHCC): 3.97 ng/mL (ref 0.00–5.00)

## 2022-12-23 LAB — MAGNESIUM: Magnesium: 1.4 mg/dL — ABNORMAL LOW (ref 1.7–2.4)

## 2022-12-23 MED ORDER — HEPARIN SOD (PORK) LOCK FLUSH 100 UNIT/ML IV SOLN
500.0000 [IU] | Freq: Once | INTRAVENOUS | Status: AC
  Administered 2022-12-23: 500 [IU] via INTRAVENOUS

## 2022-12-23 MED ORDER — SODIUM CHLORIDE 0.9% FLUSH
10.0000 mL | INTRAVENOUS | Status: DC | PRN
  Administered 2022-12-23: 10 mL via INTRAVENOUS

## 2022-12-23 NOTE — Patient Instructions (Signed)
 [employer], Adult A central line is a long, thin tube (catheter) that can be used to collect blood for testing or to give medicine through a vein. The tip of the central line ends in a large vein just above the heart (vena cava). A central line may be placed because: You need to get medicines or fluids through an IV for a long period of time. You need nutrition but cannot eat or absorb nutrients. The veins in your hands or arms are difficult to use for IV access. You need a blood transfusion. You need chemotherapy or dialysis. Types of central lines There are four main types of central lines: Peripherally inserted central catheter (PICC) line. This type is used for access of one week or longer. It can be used to draw blood and give fluids or medicines. A PICC looks like an IV tube, but it goes up the arm to the heart. It is usually inserted in the upper arm and taped in place on the arm. Tunneled central line. This type is used for long-term therapy and dialysis. It is placed in a large vein in the neck, chest, or groin. It is inserted through a small incision made over the vein, and then it is advanced to the heart. It is tunneled under the skin and brought out through a second incision. Non-tunneled central line. This type is used for short-term access, usually for a maximum of 7 days. It is often used in the emergency department. It is inserted in the neck, chest, or groin. Implanted port. This type is used for long-term therapy. It can stay in place longer than other types of central lines. It is normally inserted in the upper chest, but it can also be placed in the upper arm or the abdomen. It is inserted and removed with surgery, and it is accessed using a needle. The type of central line that you receive depends on how long you will need it, your medical condition, and the condition of your veins. Tell a health care provider about: Any allergies you have. All medicines you are taking,  including vitamins, herbs, eye drops, creams, and over-the-counter medicines. Any problems you or family members have had with anesthetic medicines. Any blood disorders you have. Any surgeries you have had. Any medical conditions you have. Whether you are pregnant or may be pregnant. What are the risks? Generally, placement and use of a central line is safe. However, problems may occur, including: Infection. A blood clot that blocks the central line or forms in the vein and travels to the heart. Bleeding from the place where the central line was inserted. Developing a hole or crack within the central line. If this happens, the central line will need to be replaced. Central line failure. The catheter moving or coming out of place. What happens before the procedure? Medicines Ask your health care provider about: Changing or stopping your regular medicines. This is especially important if you are taking diabetes medicines or blood thinners. Taking medicines such as aspirin and ibuprofen. These medicines can thin your blood. Do not take these medicines unless your health care provider tells you to take them. Taking over-the-counter medicines, vitamins, herbs, and supplements. General instructions Follow instructions from your health care provider about eating or drinking restrictions. Ask your health care provider: How your procedure site will be marked. What steps will be taken to help prevent infection. These steps may include: Removing hair at the procedure site. Washing skin with a germ-killing soap.  Plan to have a responsible adult take you home from the hospital or clinic. If you will be going home right after the procedure, plan to have a responsible adult care for you for the time you are told. This is important. What happens during the procedure? The procedure will vary depending on the type of central line being placed. In general: An IV will be inserted into one of your  veins. You will be given one or more of the following: A medicine to help you relax (sedative). A medicine to numb the area (local anesthetic). Your skin will be cleaned with a germ-killing (antiseptic) solution, and you may be covered with sterile drapes. Your blood pressure, heart rate, breathing rate, and blood oxygen level will be monitored during the procedure. The central line catheter will be inserted into the vein and advanced to the correct spot. The health care provider may use X-ray equipment to help guide the catheter to the right place. A bandage (dressing) will be placed over the insertion area. The procedure may vary among health care providers and hospitals. What can I expect after the procedure? Your blood pressure, heart rate, breathing rate, and blood oxygen level will be monitored until you leave the hospital or clinic. Antiseptic caps may be placed on the ends of the central line tubing. If you were given a sedative during the procedure, it can affect you for several hours. Do not drive or operate machinery until your health care provider says that it is safe. Follow these instructions at home: Flushing and cleaning the central line  Follow instructions from your health care provider about flushing and cleaning the central line and the area around it. Only use sterile supplies to flush the central line. Use supplies from your health care provider, a pharmacy, or another source that is recommended by your health care provider. Before you flush the central line or clean the central line or the area around it: Wash your hands with soap and water for at least 20 seconds. If soap and water are not available, use alcohol-based hand sanitizer. Clean the central line hub with rubbing alcohol. Unless directed otherwise by the manufacturer's instructions, scrub using a twisting motion and rub for 10 to 15 seconds or for 30 twists. Be sure you scrub the top of the hub, not just the  sides. Never reuse alcohol pads. Let the hub dry before use. Prevent it from touching anything while drying. Caring for the incision or central line site Check your incision or central line site every day for signs of infection. Check for: Redness, swelling, or pain. Fluid or blood. Warmth. Pus or a bad smell. Keep the insertion site of your central line clean and dry at all times. Change your dressing only as told by your health care provider. Keep your dressing dry. If it gets wet, have it changed as soon as possible. General instructions Follow instructions from your health care provider for the type of device that you have. Keep the tube clamped, unless it is being used. If the central line accidentally gets pulled on, make sure: The dressing is okay. There is no bleeding. The line has not been pulled out. Do not use scissors or sharp objects near the tube. Do not take baths, swim, or use a hot tub until your health care provider approves. Ask your health care provider if you may take showers. You may only be allowed to take sponge baths. Ask your health care provider what activities are  safe for you. You may be restricted from lifting or making repetitive arm movements on the side of your central line. Take over-the-counter and prescription medicines only as told by your health care provider. Keep all follow-up visits. This is important. Storage and disposal of supplies Keep your supplies in a clean, dry location. Throw away any used syringes in a disposal container that is meant for sharp items (sharps container). You can buy a sharps container from a pharmacy, or you can make one by using an empty hard plastic bottle with a cover. Place any used dressings or infusion bags into a plastic bag. Throw that bag in the trash. Contact a health care provider if: You have redness, swelling, or pain around your insertion site. You have fluid or blood coming from your insertion site. Your  insertion site feels warm to the touch. You have pus or a bad smell coming from your insertion site. Get help right away if: You have: A fever or chills. Shortness of breath. Chest pain or a racing heartbeat. Swelling in your neck, face, chest, or arm on the side of your central line. You feel dizzy or you faint. Your incision or central line site has red streaks spreading away from the area. Your incision or central line site is bleeding and does not stop. Your central line is difficult to flush or will not flush. You do not get a blood return from the central line. Your central line gets loose or damaged or comes out. Your catheter leaks when flushed or when fluids are infused into it. Summary A central line is a long, thin tube (catheter) that can be used to give medicine through a vein. Follow specific instructions from your health care provider for the type of device that you have. Keep the insertion site of your central line clean and dry at all times. Keep the tube clamped, unless it is being used. This information is not intended to replace advice given to you by your health care provider. Make sure you discuss any questions you have with your health care provider. Document Revised: 12/20/2019 Document Reviewed: 12/21/2019 Elsevier Patient Education  2024 [employer].

## 2022-12-23 NOTE — Progress Notes (Unsigned)
[facility] OFFICE PROGRESS NOTE   Diagnosis: Rectal cancer  INTERVAL HISTORY:   Mr. Mitchel returns for follow-up.  He completed cycle 7 FOLFOX 12/08/2022.  He began having diarrhea around 12/10/2022.  He received IV fluids in the office 12/15/2022.  At that time he appeared dehydrated.  C. difficile test returned negative.  He reports overall the diarrhea lasted 3 to 4 days.  No mouth sores.  No nausea or vomiting.  No hand or foot pain or redness.  He has a good appetite.  He is gaining weight.  Objective:  Vital signs in last 24 hours:  Blood pressure 110/62, pulse 81, temperature 98.2 F (36.8 C), temperature source Tympanic, resp. rate 18, weight 157 lb 8 oz (71.4 kg), SpO2 99%.    HEENT: No thrush or ulcers. Resp: Lungs clear bilaterally. Cardio: Regular rate and rhythm. GI: No hepatosplenomegaly. Vascular: No leg edema. Skin: Palms without erythema.   Lab Results:  Lab Results  Component Value Date   WBC 20.5 (H) 12/15/2022   HGB 11.7 (L) 12/15/2022   HCT 34.2 (L) 12/15/2022   MCV 96.6 12/15/2022   PLT 114 (L) 12/15/2022   NEUTROABS 17.3 (H) 12/15/2022    Imaging:  No results found.  Medications: I have reviewed the patient's current medications.  Assessment/Plan: Rectal cancer Colonoscopy 07/24/2022-mass in the posterior rectum extending from the dentate line for 3 cm, biopsy-moderately differentiated invasive adenocarcinoma, mismatch repair protein expression intact CT abdomen/pelvis 04/07/2022-cardiac enlargement, hepatic steatosis mild left adrenal nodularity stable compared to chest CT March 2020, small nonobstructing bilateral renal calculi, extensive colonic diverticula CT chest 06/03/2022-6 mm nodule in the superior right lower lobe-new from 07/12/2018, new 1-2 mm nodule within the left upper lobe Elevated CEA 07/24/2022 MRI pelvis 08/06/2022-tumor at 3.5 cm from the anal verge with involvement of the upper anal sphincter, invasion of the  inferior right levator ani muscle, T4b, one 5 mm left posterior perirectal node-in 1 CTs 08/19/2022-partially circumferential mass of the low rectum.  Small bilateral pulmonary nodules slightly enlarged compared to prior examination.  Plan for follow-up CT chest after completing 6 cycles of chemotherapy. Cycle 1 FOLFOX 08/24/2022 Cycle 2 FOLFOX 09/07/2022 Chemotherapy held 09/21/2022 due to severe neutropenia, thrombocytopenia Cycle 3 FOLFOX 10/05/2022, Udenyca, oxaliplatin dose reduced secondary to thrombocytopenia following cycle 2 Treatment held 10/20/2022 due to thrombocytopenia Cycle 4 FOLFOX 10/27/2022, Udenyca, oxaliplatin dose reduced secondary to thrombocytopenia following cycle 3 Cycle 5 FOLFOX 11/09/2022, Udenyca Cycle 6 FOLFOX 11/24/2022, Udenyca Cycle 7 FOLFOX 12/08/2022, Udenyca CTs 12/16/2022-similar appearance of circumferential low rectal wall mass; decreased size of scattered small bilateral pulmonary nodules.  No new suspicious pulmonary nodules or masses.  No evidence of metastatic disease in the abdomen or pelvis.   CAD, status post myocardial infarction 2015 and July 2023, D1 branch LAD angioplasty 12/23/2021 Hyperlipidemia Hypertension PTSD BPH Cigar use  Disposition: Mr. Eberle appears stable.  He has completed 7 cycles of FOLFOX.  He developed fairly significant diarrhea following cycle 7.  He was scheduled for the eighth and final cycle of FOLFOX earlier this week but had to cancel due to another appointment.  We discussed rescheduling cycle 8 to next week.  He would like to reschedule for the week of 01/04/2023.  5-fluorouracil will be dose reduced with cycle 8.  He agrees with this plan.  He had repeat CT scans 12/16/2022.  Rectal mass appeared similar, small bilateral pulmonary nodules were decreased in size.  There was no new disease.  Results/images reviewed with  him at today's visit.  He understands the lung nodules could indicate metastatic disease.  His case will be presented  at an upcoming GI tumor conference.  We will see him in follow-up prior to chemotherapy the week of 01/04/2023.  He will contact the office in the interim with any problems.  Patient seen with Dr. Truett Perna.    Johnmark Pinter ANP/GNP-BC   12/23/2022  2:02 PM  This was a shared visit with Lonna Cobb.  We reviewed the restaging CT findings and images with Mr. Viele.  There are a few small lung nodules which have decreased in size.  These lesions are indeterminate, but remains suspicious for metastases.  There is no evidence of disease progression.  The plan is to complete 8 cycles of FOLFOX.  I will present his case at the GI tumor conference to discuss management going forward.  We will most likely recommend total neoadjuvant therapy with capecitabine/radiation prior to another restaging evaluation.  The 5-fluorouracil will be dose reduced with cycle 8.  I was present for greater than 50% of today's visit.  I performed medical decision making.  Mancel Bale, MD

## 2022-12-24 ENCOUNTER — Encounter: Payer: Self-pay | Admitting: Oncology

## 2022-12-24 ENCOUNTER — Other Ambulatory Visit: Payer: Self-pay

## 2022-12-24 ENCOUNTER — Telehealth: Payer: Self-pay

## 2022-12-24 NOTE — Telephone Encounter (Signed)
-----   Message from Lonna Cobb sent at 12/23/2022  5:03 PM EDT ----- Let him know magnesium level is low.  Recommend 2 g IV mag 12/24/2022.

## 2022-12-24 NOTE — Telephone Encounter (Signed)
Patient is schedule to come on 12/25/22

## 2022-12-25 ENCOUNTER — Inpatient Hospital Stay: Payer: No Typology Code available for payment source

## 2022-12-28 ENCOUNTER — Inpatient Hospital Stay: Payer: No Typology Code available for payment source

## 2022-12-28 VITALS — BP 99/56 | HR 58 | Temp 98.1°F | Resp 18 | Wt 162.7 lb

## 2022-12-28 DIAGNOSIS — Z5111 Encounter for antineoplastic chemotherapy: Secondary | ICD-10-CM | POA: Diagnosis not present

## 2022-12-28 DIAGNOSIS — Z95828 Presence of other vascular implants and grafts: Secondary | ICD-10-CM

## 2022-12-28 MED ORDER — SODIUM CHLORIDE 0.9 % IV SOLN: Freq: Once | INTRAVENOUS | Status: AC

## 2022-12-28 MED ORDER — SODIUM CHLORIDE 0.9% FLUSH
10.0000 mL | Freq: Once | INTRAVENOUS | Status: AC
  Administered 2022-12-28: 10 mL via INTRAVENOUS

## 2022-12-28 MED ORDER — HEPARIN SOD (PORK) LOCK FLUSH 100 UNIT/ML IV SOLN
500.0000 [IU] | Freq: Once | INTRAVENOUS | Status: AC
  Administered 2022-12-28: 500 [IU] via INTRAVENOUS

## 2022-12-28 MED ORDER — MAGNESIUM SULFATE 2 GM/50ML IV SOLN
2.0000 g | Freq: Once | INTRAVENOUS | Status: AC
  Administered 2022-12-28: 2 g via INTRAVENOUS
  Filled 2022-12-28: qty 50

## 2022-12-30 ENCOUNTER — Other Ambulatory Visit: Payer: Self-pay

## 2022-12-30 NOTE — Progress Notes (Signed)
The proposed treatment discussed in conference is for discussion purpose only and is not a binding recommendation.  The patients have not been physically examined, or presented with their treatment options.  Therefore, final treatment plans cannot be decided.  

## 2023-01-05 ENCOUNTER — Other Ambulatory Visit: Payer: Self-pay

## 2023-01-05 ENCOUNTER — Inpatient Hospital Stay (HOSPITAL_BASED_OUTPATIENT_CLINIC_OR_DEPARTMENT_OTHER): Payer: No Typology Code available for payment source | Admitting: Nurse Practitioner

## 2023-01-05 ENCOUNTER — Encounter: Payer: Self-pay | Admitting: Nurse Practitioner

## 2023-01-05 ENCOUNTER — Inpatient Hospital Stay: Payer: No Typology Code available for payment source

## 2023-01-05 ENCOUNTER — Telehealth: Payer: Self-pay

## 2023-01-05 ENCOUNTER — Inpatient Hospital Stay: Payer: No Typology Code available for payment source | Attending: Nurse Practitioner

## 2023-01-05 VITALS — BP 136/79 | HR 96 | Temp 98.2°F | Resp 18 | Ht 72.0 in | Wt 159.2 lb

## 2023-01-05 DIAGNOSIS — I252 Old myocardial infarction: Secondary | ICD-10-CM | POA: Diagnosis not present

## 2023-01-05 DIAGNOSIS — C20 Malignant neoplasm of rectum: Secondary | ICD-10-CM

## 2023-01-05 DIAGNOSIS — Z5111 Encounter for antineoplastic chemotherapy: Secondary | ICD-10-CM | POA: Insufficient documentation

## 2023-01-05 DIAGNOSIS — Z5189 Encounter for other specified aftercare: Secondary | ICD-10-CM | POA: Diagnosis not present

## 2023-01-05 DIAGNOSIS — E785 Hyperlipidemia, unspecified: Secondary | ICD-10-CM | POA: Diagnosis not present

## 2023-01-05 DIAGNOSIS — F17210 Nicotine dependence, cigarettes, uncomplicated: Secondary | ICD-10-CM | POA: Insufficient documentation

## 2023-01-05 DIAGNOSIS — I10 Essential (primary) hypertension: Secondary | ICD-10-CM | POA: Insufficient documentation

## 2023-01-05 DIAGNOSIS — I2510 Atherosclerotic heart disease of native coronary artery without angina pectoris: Secondary | ICD-10-CM | POA: Diagnosis not present

## 2023-01-05 LAB — CMP (CANCER CENTER ONLY)
ALT: 16 U/L (ref 0–44)
AST: 21 U/L (ref 15–41)
Albumin: 4.3 g/dL (ref 3.5–5.0)
Alkaline Phosphatase: 80 U/L (ref 38–126)
Anion gap: 8 (ref 5–15)
BUN: 15 mg/dL (ref 8–23)
CO2: 25 mmol/L (ref 22–32)
Calcium: 8.9 mg/dL (ref 8.9–10.3)
Chloride: 106 mmol/L (ref 98–111)
Creatinine: 0.87 mg/dL (ref 0.61–1.24)
GFR, Estimated: >60 mL/min (ref >60)
Glucose, Bld: 129 mg/dL — ABNORMAL HIGH (ref 70–99)
Potassium: 3.7 mmol/L (ref 3.5–5.1)
Sodium: 139 mmol/L (ref 135–145)
Total Bilirubin: 0.4 mg/dL (ref 0.3–1.2)
Total Protein: 6.7 g/dL (ref 6.5–8.1)

## 2023-01-05 LAB — CBC WITH DIFFERENTIAL (CANCER CENTER ONLY)
Abs Immature Granulocytes: 0.03 10*3/uL (ref 0.00–0.07)
Basophils Absolute: 0.1 10*3/uL (ref 0.0–0.1)
Basophils Relative: 1 %
Eosinophils Absolute: 0.1 10*3/uL (ref 0.0–0.5)
Eosinophils Relative: 1 %
HCT: 34.4 % — ABNORMAL LOW (ref 39.0–52.0)
Hemoglobin: 11.3 g/dL — ABNORMAL LOW (ref 13.0–17.0)
Immature Granulocytes: 0 %
Lymphocytes Relative: 22 %
Lymphs Abs: 1.9 10*3/uL (ref 0.7–4.0)
MCH: 32.8 pg (ref 26.0–34.0)
MCHC: 32.8 g/dL (ref 30.0–36.0)
MCV: 99.7 fL (ref 80.0–100.0)
Monocytes Absolute: 1 10*3/uL (ref 0.1–1.0)
Monocytes Relative: 12 %
Neutro Abs: 5.5 10*3/uL (ref 1.7–7.7)
Neutrophils Relative %: 64 %
Platelet Count: 178 10*3/uL (ref 150–400)
RBC: 3.45 MIL/uL — ABNORMAL LOW (ref 4.22–5.81)
RDW: 15 % (ref 11.5–15.5)
WBC Count: 8.6 10*3/uL (ref 4.0–10.5)
nRBC: 0 % (ref 0.0–0.2)

## 2023-01-05 LAB — MAGNESIUM: Magnesium: 1.9 mg/dL (ref 1.7–2.4)

## 2023-01-05 NOTE — Progress Notes (Addendum)
Patient seen by Lonna Cobb NP today  Vitals are within treatment parameters.  Labs reviewed by Lonna Cobb NP and are within treatment parameters.  Per physician team, patient is ready for treatment. Please note that modifications are being made to the treatment plan including 5-FU eliminated and 5-FU pump dose reduced.   Chemo on 01/06/2023

## 2023-01-05 NOTE — Telephone Encounter (Signed)
Patient gave verbal understanding and had no further questions or concerns  

## 2023-01-05 NOTE — Progress Notes (Signed)
[name] OFFICE PROGRESS NOTE   Diagnosis: Rectal cancer  INTERVAL HISTORY:   [name] returns for follow-up.  He completed cycle 7 FOLFOX 12/08/2022.  He had significant diarrhea following cycle 7.  He canceled cycle 8 and rescheduled to today.  He feels well.  No nausea or vomiting.  No diarrhea.  No mouth sores.  No cold sensitivity.  No numbness or tingling in the hands or feet.  Appetite is better.  Objective:  Vital signs in last 24 hours:  Blood pressure 136/79, pulse 96, temperature 98.2 F (36.8 C), temperature source Oral, resp. rate 18, height 6' (1.829 m), weight 159 lb 3.2 oz (72.2 kg), SpO2 100%.    HEENT: No thrush or ulcers. Resp: Lungs clear bilaterally. Cardio: Regular rate and rhythm. GI: No hepatosplenomegaly. Vascular: No leg edema. Neuro: Vibratory sense minimally decreased to intact over the fingertips per tuning fork exam. Skin: Palms without erythema. Port-A-Cath without erythema.  Lab Results:  Lab Results  Component Value Date   WBC 8.6 01/05/2023   HGB 11.3 (L) 01/05/2023   HCT 34.4 (L) 01/05/2023   MCV 99.7 01/05/2023   PLT 178 01/05/2023   NEUTROABS 5.5 01/05/2023    Imaging:  No results found.  Medications: I have reviewed the patient's current medications.  Assessment/Plan: Rectal cancer Colonoscopy 07/24/2022-mass in the posterior rectum extending from the dentate line for 3 cm, biopsy-moderately differentiated invasive adenocarcinoma, mismatch repair protein expression intact CT abdomen/pelvis 04/07/2022-cardiac enlargement, hepatic steatosis mild left adrenal nodularity stable compared to chest CT March 2020, small nonobstructing bilateral renal calculi, extensive colonic diverticula CT chest 06/03/2022-6 mm nodule in the superior right lower lobe-new from 07/12/2018, new 1-2 mm nodule within the left upper lobe Elevated CEA 07/24/2022 MRI pelvis 08/06/2022-tumor at 3.5 cm from the anal verge with involvement of the  upper anal sphincter, invasion of the inferior right levator ani muscle, T4b, one 5 mm left posterior perirectal node-in 1 CTs 08/19/2022-partially circumferential mass of the low rectum.  Small bilateral pulmonary nodules slightly enlarged compared to prior examination.  Plan for follow-up CT chest after completing 6 cycles of chemotherapy. Cycle 1 FOLFOX 08/24/2022 Cycle 2 FOLFOX 09/07/2022 Chemotherapy held 09/21/2022 due to severe neutropenia, thrombocytopenia Cycle 3 FOLFOX 10/05/2022, Udenyca, oxaliplatin dose reduced secondary to thrombocytopenia following cycle 2 Treatment held 10/20/2022 due to thrombocytopenia Cycle 4 FOLFOX 10/27/2022, Udenyca, oxaliplatin dose reduced secondary to thrombocytopenia following cycle 3 Cycle 5 FOLFOX 11/09/2022, Udenyca Cycle 6 FOLFOX 11/24/2022, Udenyca Cycle 7 FOLFOX 12/08/2022, Udenyca CTs 12/16/2022-similar appearance of circumferential low rectal wall mass; decreased size of scattered small bilateral pulmonary nodules.  No new suspicious pulmonary nodules or masses.  No evidence of metastatic disease in the abdomen or pelvis. Cycle 8 FOLFOX 01/05/2023, 5-FU bolus eliminated, pump dose decreased due to diarrhea   CAD, status post myocardial infarction 2015 and July 2023, D1 branch LAD angioplasty 12/23/2021 Hyperlipidemia Hypertension PTSD BPH Cigar use  Disposition: [name] appears stable.  He has completed 7 cycles of FOLFOX.  He had significant diarrhea following cycle 7.  5-FU bolus will be eliminated and 5-FU pump dose reduced with the eighth and final cycle.  Plan to proceed with cycle 8 01/06/2023.  We discussed the plan for capecitabine/radiation beginning a few weeks after he has completed the final cycle of FOLFOX..  We reviewed potential side effects associated with capecitabine including bone marrow toxicity, nausea, mouth sores, diarrhea, hand-foot syndrome, increased sensitivity to sun, skin hyperpigmentation, rash.  He is in agreement.  Referral  placed to Dr. Mitzi Hansen.  He will return for lab and follow-up on 01/21/2023.  He will contact the office in the interim with any problems.  We specifically discussed recurrent diarrhea.   Ikaika Bhatia ANP/GNP-BC   01/05/2023  2:16 PM

## 2023-01-05 NOTE — Telephone Encounter (Signed)
-----   Message from Lonna Cobb sent at 01/05/2023  3:03 PM EDT ----- Please let him know the magnesium level is normal.

## 2023-01-06 ENCOUNTER — Inpatient Hospital Stay: Payer: No Typology Code available for payment source

## 2023-01-06 ENCOUNTER — Ambulatory Visit: Payer: Non-veteran care

## 2023-01-06 ENCOUNTER — Other Ambulatory Visit: Payer: Self-pay | Admitting: Oncology

## 2023-01-06 VITALS — BP 108/66 | HR 71 | Temp 98.2°F | Resp 18

## 2023-01-06 DIAGNOSIS — C20 Malignant neoplasm of rectum: Secondary | ICD-10-CM

## 2023-01-06 DIAGNOSIS — Z5111 Encounter for antineoplastic chemotherapy: Secondary | ICD-10-CM | POA: Diagnosis not present

## 2023-01-06 MED ORDER — SODIUM CHLORIDE 0.9 % IV SOLN
10.0000 mg | Freq: Once | INTRAVENOUS | Status: AC
  Administered 2023-01-06: 10 mg via INTRAVENOUS
  Filled 2023-01-06: qty 10

## 2023-01-06 MED ORDER — LEUCOVORIN CALCIUM INJECTION 350 MG
200.0000 mg/m2 | Freq: Once | INTRAVENOUS | Status: AC
  Administered 2023-01-06: 380 mg via INTRAVENOUS
  Filled 2023-01-06: qty 19

## 2023-01-06 MED ORDER — SODIUM CHLORIDE 0.9 % IV SOLN
1600.0000 mg/m2 | INTRAVENOUS | Status: DC
  Administered 2023-01-06: 3050 mg via INTRAVENOUS
  Filled 2023-01-06: qty 61

## 2023-01-06 MED ORDER — DEXTROSE 5 % IV SOLN: Freq: Once | INTRAVENOUS | Status: AC

## 2023-01-06 MED ORDER — SODIUM CHLORIDE 0.9% FLUSH
10.0000 mL | INTRAVENOUS | Status: DC | PRN
  Administered 2023-01-06: 10 mL

## 2023-01-06 MED ORDER — PALONOSETRON HCL INJECTION 0.25 MG/5ML
0.2500 mg | Freq: Once | INTRAVENOUS | Status: AC
  Administered 2023-01-06: 0.25 mg via INTRAVENOUS
  Filled 2023-01-06: qty 5

## 2023-01-06 MED ORDER — OXALIPLATIN CHEMO INJECTION 100 MG/20ML
50.0000 mg/m2 | Freq: Once | INTRAVENOUS | Status: AC
  Administered 2023-01-06: 100 mg via INTRAVENOUS
  Filled 2023-01-06: qty 20

## 2023-01-06 NOTE — Patient Instructions (Signed)
[place] [facility]   Discharge Instructions: Thank you for choosing [facility] to provide your oncology and hematology care.   If you have a lab appointment with the Cancer Center, please go directly to the Cancer Center and check in at the registration area.   Wear comfortable clothing and clothing appropriate for easy access to any Portacath or PICC line.   We strive to give you quality time with your provider. You may need to reschedule your appointment if you arrive late (15 or more minutes).  Arriving late affects you and other patients whose appointments are after yours.  Also, if you miss three or more appointments without notifying the office, you may be dismissed from the clinic at the provider's discretion.      For prescription refill requests, have your pharmacy contact our office and allow 72 hours for refills to be completed.    Today you received the following chemotherapy and/or immunotherapy agents Oxaliplatin (ELOXATIN), Leucovorin & Flourouracil (ADRUCIL).      To help prevent nausea and vomiting after your treatment, we encourage you to take your nausea medication as directed.  BELOW ARE SYMPTOMS THAT SHOULD BE REPORTED IMMEDIATELY: *FEVER GREATER THAN 100.4 F (38 C) OR HIGHER *CHILLS OR SWEATING *NAUSEA AND VOMITING THAT IS NOT CONTROLLED WITH YOUR NAUSEA MEDICATION *UNUSUAL SHORTNESS OF BREATH *UNUSUAL BRUISING OR BLEEDING *URINARY PROBLEMS (pain or burning when urinating, or frequent urination) *BOWEL PROBLEMS (unusual diarrhea, constipation, pain near the anus) TENDERNESS IN MOUTH AND THROAT WITH OR WITHOUT PRESENCE OF ULCERS (sore throat, sores in mouth, or a toothache) UNUSUAL RASH, SWELLING OR PAIN  UNUSUAL VAGINAL DISCHARGE OR ITCHING   Items with * indicate a potential emergency and should be followed up as soon as possible or go to the Emergency Department if any problems should occur.  Please show the  CHEMOTHERAPY ALERT CARD or IMMUNOTHERAPY ALERT CARD at check-in to the Emergency Department and triage nurse.  Should you have questions after your visit or need to cancel or reschedule your appointment, please contact [place] [facility]  Dept: [phone]  and follow the prompts.  Office hours are 8:00 a.m. to 4:30 p.m. Monday - Friday. Please note that voicemails left after 4:00 p.m. may not be returned until the following business day.  We are closed weekends and major holidays. You have access to a nurse at all times for urgent questions. Please call the main number to the clinic Dept: [phone] and follow the prompts.   For any non-urgent questions, you may also contact your provider using MyChart. We now offer e-Visits for anyone 81 and older to request care online for non-urgent symptoms. For details visit [email or web address].   Also download the MyChart app! Go to the app store, search "MyChart", open the app, select [place], and log in with your MyChart username and password.  Oxaliplatin Injection What is this medication? OXALIPLATIN (ox AL i PLA tin) treats colorectal cancer. It works by slowing down the growth of cancer cells. This medicine may be used for other purposes; ask your health care provider or pharmacist if you have questions. COMMON BRAND NAME(S): Eloxatin What should I tell my care team before I take this medication? They need to know if you have any of these conditions: Heart disease History of irregular heartbeat or rhythm Liver disease Low blood cell levels (white cells, red cells, and platelets) Lung or breathing disease, such as asthma Take medications that treat  or prevent blood clots Tingling of the fingers, toes, or other nerve disorder An unusual or allergic reaction to oxaliplatin, other medications, foods, dyes, or preservatives If you or your partner are pregnant or trying to get pregnant Breast-feeding How should  I use this medication? This medication is injected into a vein. It is given by your care team in a hospital or clinic setting. Talk to your care team about the use of this medication in children. Special care may be needed. Overdosage: If you think you have taken too much of this medicine contact a poison control center or emergency room at once. NOTE: This medicine is only for you. Do not share this medicine with others. What if I miss a dose? Keep appointments for follow-up doses. It is important not to miss a dose. Call your care team if you are unable to keep an appointment. What may interact with this medication? Do not take this medication with any of the following: Cisapride Dronedarone Pimozide Thioridazine This medication may also interact with the following: Aspirin and aspirin-like medications Certain medications that treat or prevent blood clots, such as warfarin, apixaban, dabigatran, and rivaroxaban Cisplatin Cyclosporine Diuretics Medications for infection, such as acyclovir, adefovir, amphotericin B, bacitracin, cidofovir, foscarnet, ganciclovir, gentamicin, pentamidine, vancomycin NSAIDs, medications for pain and inflammation, such as ibuprofen or naproxen Other medications that cause heart rhythm changes Pamidronate Zoledronic acid This list may not describe all possible interactions. Give your health care provider a list of all the medicines, herbs, non-prescription drugs, or dietary supplements you use. Also tell them if you smoke, drink alcohol, or use illegal drugs. Some items may interact with your medicine. What should I watch for while using this medication? Your condition will be monitored carefully while you are receiving this medication. You may need blood work while taking this medication. This medication may make you feel generally unwell. This is not uncommon as chemotherapy can affect healthy cells as well as cancer cells. Report any side effects. Continue  your course of treatment even though you feel ill unless your care team tells you to stop. This medication may increase your risk of getting an infection. Call your care team for advice if you get a fever, chills, sore throat, or other symptoms of a cold or flu. Do not treat yourself. Try to avoid being around people who are sick. Avoid taking medications that contain aspirin, acetaminophen, ibuprofen, naproxen, or ketoprofen unless instructed by your care team. These medications may hide a fever. Be careful brushing or flossing your teeth or using a toothpick because you may get an infection or bleed more easily. If you have any dental work done, tell your dentist you are receiving this medication. This medication can make you more sensitive to cold. Do not drink cold drinks or use ice. Cover exposed skin before coming in contact with cold temperatures or cold objects. When out in cold weather wear warm clothing and cover your mouth and nose to warm the air that goes into your lungs. Tell your care team if you get sensitive to the cold. Talk to your care team if you or your partner are pregnant or think either of you might be pregnant. This medication can cause serious birth defects if taken during pregnancy and for 9 months after the last dose. A negative pregnancy test is required before starting this medication. A reliable form of contraception is recommended while taking this medication and for 9 months after the last dose. Talk to your care  team about effective forms of contraception. Do not father a child while taking this medication and for 6 months after the last dose. Use a condom while having sex during this time period. Do not breastfeed while taking this medication and for 3 months after the last dose. This medication may cause infertility. Talk to your care team if you are concerned about your fertility. What side effects may I notice from receiving this medication? Side effects that you  should report to your care team as soon as possible: Allergic reactions--skin rash, itching, hives, swelling of the face, lips, tongue, or throat Bleeding--bloody or black, tar-like stools, vomiting blood or brown material that looks like coffee grounds, red or dark brown urine, small red or purple spots on skin, unusual bruising or bleeding Dry cough, shortness of breath or trouble breathing Heart rhythm changes--fast or irregular heartbeat, dizziness, feeling faint or lightheaded, chest pain, trouble breathing Infection--fever, chills, cough, sore throat, wounds that don't heal, pain or trouble when passing urine, general feeling of discomfort or being unwell Liver injury--right upper belly pain, loss of appetite, nausea, light-colored stool, dark yellow or brown urine, yellowing skin or eyes, unusual weakness or fatigue Low red blood cell level--unusual weakness or fatigue, dizziness, headache, trouble breathing Muscle injury--unusual weakness or fatigue, muscle pain, dark yellow or brown urine, decrease in amount of urine Pain, tingling, or numbness in the hands or feet Sudden and severe headache, confusion, change in vision, seizures, which may be signs of posterior reversible encephalopathy syndrome (PRES) Unusual bruising or bleeding Side effects that usually do not require medical attention (report to your care team if they continue or are bothersome): Diarrhea Nausea Pain, redness, or swelling with sores inside the mouth or throat Unusual weakness or fatigue Vomiting This list may not describe all possible side effects. Call your doctor for medical advice about side effects. You may report side effects to FDA at 1-800-FDA-1088. Where should I keep my medication? This medication is given in a hospital or clinic. It will not be stored at home. NOTE: This sheet is a summary. It may not cover all possible information. If you have questions about this medicine, talk to your doctor,  pharmacist, or health care provider.  2024 Elsevier/Gold Standard (2022-02-03 00:00:00)  Leucovorin Injection What is this medication? LEUCOVORIN (loo koe VOR in) prevents side effects from certain medications, such as methotrexate. It works by increasing folate levels. This helps protect healthy cells in your body. It may also be used to treat anemia caused by low levels of folate. It can also be used with fluorouracil, a type of chemotherapy, to treat colorectal cancer. It works by increasing the effects of fluorouracil in the body. This medicine may be used for other purposes; ask your health care provider or pharmacist if you have questions. What should I tell my care team before I take this medication? They need to know if you have any of these conditions: Anemia from low levels of vitamin B12 in the blood An unusual or allergic reaction to leucovorin, folic acid, other medications, foods, dyes, or preservatives Pregnant or trying to get pregnant Breastfeeding How should I use this medication? This medication is injected into a vein or a muscle. It is given by your care team in a hospital or clinic setting. Talk to your care team about the use of this medication in children. Special care may be needed. Overdosage: If you think you have taken too much of this medicine contact a poison control center  or emergency room at once. NOTE: This medicine is only for you. Do not share this medicine with others. What if I miss a dose? Keep appointments for follow-up doses. It is important not to miss your dose. Call your care team if you are unable to keep an appointment. What may interact with this medication? Capecitabine Fluorouracil Phenobarbital Phenytoin Primidone Trimethoprim;sulfamethoxazole This list may not describe all possible interactions. Give your health care provider a list of all the medicines, herbs, non-prescription drugs, or dietary supplements you use. Also tell them if you  smoke, drink alcohol, or use illegal drugs. Some items may interact with your medicine. What should I watch for while using this medication? Your condition will be monitored carefully while you are receiving this medication. This medication may increase the side effects of 5-fluorouracil. Tell your care team if you have diarrhea or mouth sores that do not get better or that get worse. What side effects may I notice from receiving this medication? Side effects that you should report to your care team as soon as possible: Allergic reactions--skin rash, itching, hives, swelling of the face, lips, tongue, or throat This list may not describe all possible side effects. Call your doctor for medical advice about side effects. You may report side effects to FDA at 1-800-FDA-1088. Where should I keep my medication? This medication is given in a hospital or clinic. It will not be stored at home. NOTE: This sheet is a summary. It may not cover all possible information. If you have questions about this medicine, talk to your doctor, pharmacist, or health care provider.  2024 Elsevier/Gold Standard (2021-09-23 00:00:00)  Fluorouracil Injection What is this medication? FLUOROURACIL (flure oh YOOR a sil) treats some types of cancer. It works by slowing down the growth of cancer cells. This medicine may be used for other purposes; ask your health care provider or pharmacist if you have questions. COMMON BRAND NAME(S): Adrucil What should I tell my care team before I take this medication? They need to know if you have any of these conditions: Blood disorders Dihydropyrimidine dehydrogenase (DPD) deficiency Infection, such as chickenpox, cold sores, herpes Kidney disease Liver disease Poor nutrition Recent or ongoing radiation therapy An unusual or allergic reaction to fluorouracil, other medications, foods, dyes, or preservatives If you or your partner are pregnant or trying to get  pregnant Breast-feeding How should I use this medication? This medication is injected into a vein. It is administered by your care team in a hospital or clinic setting. Talk to your care team about the use of this medication in children. Special care may be needed. Overdosage: If you think you have taken too much of this medicine contact a poison control center or emergency room at once. NOTE: This medicine is only for you. Do not share this medicine with others. What if I miss a dose? Keep appointments for follow-up doses. It is important not to miss your dose. Call your care team if you are unable to keep an appointment. What may interact with this medication? Do not take this medication with any of the following: Live virus vaccines This medication may also interact with the following: Medications that treat or prevent blood clots, such as warfarin, enoxaparin, dalteparin This list may not describe all possible interactions. Give your health care provider a list of all the medicines, herbs, non-prescription drugs, or dietary supplements you use. Also tell them if you smoke, drink alcohol, or use illegal drugs. Some items may interact with your  medicine. What should I watch for while using this medication? Your condition will be monitored carefully while you are receiving this medication. This medication may make you feel generally unwell. This is not uncommon as chemotherapy can affect healthy cells as well as cancer cells. Report any side effects. Continue your course of treatment even though you feel ill unless your care team tells you to stop. In some cases, you may be given additional medications to help with side effects. Follow all directions for their use. This medication may increase your risk of getting an infection. Call your care team for advice if you get a fever, chills, sore throat, or other symptoms of a cold or flu. Do not treat yourself. Try to avoid being around people who are  sick. This medication may increase your risk to bruise or bleed. Call your care team if you notice any unusual bleeding. Be careful brushing or flossing your teeth or using a toothpick because you may get an infection or bleed more easily. If you have any dental work done, tell your dentist you are receiving this medication. Avoid taking medications that contain aspirin, acetaminophen, ibuprofen, naproxen, or ketoprofen unless instructed by your care team. These medications may hide a fever. Do not treat diarrhea with over the counter products. Contact your care team if you have diarrhea that lasts more than 2 days or if it is severe and watery. This medication can make you more sensitive to the sun. Keep out of the sun. If you cannot avoid being in the sun, wear protective clothing and sunscreen. Do not use sun lamps, tanning beds, or tanning booths. Talk to your care team if you or your partner wish to become pregnant or think you might be pregnant. This medication can cause serious birth defects if taken during pregnancy and for 3 months after the last dose. A reliable form of contraception is recommended while taking this medication and for 3 months after the last dose. Talk to your care team about effective forms of contraception. Do not father a child while taking this medication and for 3 months after the last dose. Use a condom while having sex during this time period. Do not breastfeed while taking this medication. This medication may cause infertility. Talk to your care team if you are concerned about your fertility. What side effects may I notice from receiving this medication? Side effects that you should report to your care team as soon as possible: Allergic reactions--skin rash, itching, hives, swelling of the face, lips, tongue, or throat Heart attack--pain or tightness in the chest, shoulders, arms, or jaw, nausea, shortness of breath, cold or clammy skin, feeling faint or  lightheaded Heart failure--shortness of breath, swelling of the ankles, feet, or hands, sudden weight gain, unusual weakness or fatigue Heart rhythm changes--fast or irregular heartbeat, dizziness, feeling faint or lightheaded, chest pain, trouble breathing High ammonia level--unusual weakness or fatigue, confusion, loss of appetite, nausea, vomiting, seizures Infection--fever, chills, cough, sore throat, wounds that don't heal, pain or trouble when passing urine, general feeling of discomfort or being unwell Low red blood cell level--unusual weakness or fatigue, dizziness, headache, trouble breathing Pain, tingling, or numbness in the hands or feet, muscle weakness, change in vision, confusion or trouble speaking, loss of balance or coordination, trouble walking, seizures Redness, swelling, and blistering of the skin over hands and feet Severe or prolonged diarrhea Unusual bruising or bleeding Side effects that usually do not require medical attention (report to your care team if  they continue or are bothersome): Dry skin Headache Increased tears Nausea Pain, redness, or swelling with sores inside the mouth or throat Sensitivity to light Vomiting This list may not describe all possible side effects. Call your doctor for medical advice about side effects. You may report side effects to FDA at 1-800-FDA-1088. Where should I keep my medication? This medication is given in a hospital or clinic. It will not be stored at home. NOTE: This sheet is a summary. It may not cover all possible information. If you have questions about this medicine, talk to your doctor, pharmacist, or health care provider.  2024 Elsevier/Gold Standard (2021-08-26 00:00:00)  The chemotherapy medication bag should finish at 46 hours, 96 hours, or 7 days. For example, if your pump is scheduled for 46 hours and it was put on at 4:00 p.m., it should finish at 2:00 p.m. the day it is scheduled to come off regardless of your  appointment time.     Estimated time to finish at 9:30am on Friday 01/08/2023.   If the display on your pump reads "Low Volume" and it is beeping, take the batteries out of the pump and come to the cancer center for it to be taken off.   If the pump alarms go off prior to the pump reading "Low Volume" then call [phone] and someone can assist you.  If the plunger comes out and the chemotherapy medication is leaking out, please use your home chemo spill kit to clean up the spill. Do NOT use paper towels or other household products.  If you have problems or questions regarding your pump, please call either [phone] (24 hours a day) or the cancer center Monday-Friday 8:00 a.m.- 4:30 p.m. at the clinic number and we will assist you. If you are unable to get assistance, then go to the nearest Emergency Department and ask the staff to contact the IV team for assistance.

## 2023-01-08 ENCOUNTER — Inpatient Hospital Stay: Payer: No Typology Code available for payment source

## 2023-01-08 VITALS — BP 121/67 | HR 71 | Temp 98.2°F | Resp 18 | Ht 72.0 in | Wt 161.0 lb

## 2023-01-08 DIAGNOSIS — C20 Malignant neoplasm of rectum: Secondary | ICD-10-CM

## 2023-01-08 DIAGNOSIS — Z5111 Encounter for antineoplastic chemotherapy: Secondary | ICD-10-CM | POA: Diagnosis not present

## 2023-01-08 MED ORDER — HEPARIN SOD (PORK) LOCK FLUSH 100 UNIT/ML IV SOLN
500.0000 [IU] | Freq: Once | INTRAVENOUS | Status: AC | PRN
  Administered 2023-01-08: 500 [IU]

## 2023-01-08 MED ORDER — SODIUM CHLORIDE 0.9% FLUSH
10.0000 mL | INTRAVENOUS | Status: DC | PRN
  Administered 2023-01-08: 10 mL

## 2023-01-08 MED ORDER — PEGFILGRASTIM-CBQV 6 MG/0.6ML ~~LOC~~ SOSY
6.0000 mg | PREFILLED_SYRINGE | Freq: Once | SUBCUTANEOUS | Status: AC
  Administered 2023-01-08: 6 mg via SUBCUTANEOUS

## 2023-01-12 ENCOUNTER — Ambulatory Visit: Payer: No Typology Code available for payment source

## 2023-01-12 ENCOUNTER — Ambulatory Visit
Admission: RE | Admit: 2023-01-12 | Payer: No Typology Code available for payment source | Source: Ambulatory Visit | Admitting: Radiation Oncology

## 2023-01-12 DIAGNOSIS — C20 Malignant neoplasm of rectum: Secondary | ICD-10-CM

## 2023-01-13 ENCOUNTER — Telehealth: Payer: Self-pay | Admitting: Radiation Oncology

## 2023-01-13 NOTE — Telephone Encounter (Signed)
9/11 @ 8:48 am Called and spoke to patient to get him reschedule for his missed appointment on 9/10, after reschedule for 9/18, patient requested an appointment reminder to be text or email.  Appointment reminder sent to his email due to him having mychart issues.

## 2023-01-15 ENCOUNTER — Encounter: Payer: Self-pay | Admitting: Radiation Oncology

## 2023-01-15 NOTE — Progress Notes (Signed)
Nursing interview for [facility]). Patient identity verified x2.  Patient reports doing well. No issues conveyed at this time.  Meaningful use complete.  Vitals- There were no vitals taken for this visit.  This concludes the interaction.  [name], LPN

## 2023-01-18 ENCOUNTER — Other Ambulatory Visit: Payer: Non-veteran care

## 2023-01-18 NOTE — Progress Notes (Signed)
Radiation Oncology         ([phone]) [phone] ________________________________  Name: Ronald [name]        MRN: [id number]  Date of Service: 01/20/2023 DOB: [birth year]  HB:ZJIR, Karen Chafe, MD  Ladene Artist, MD     REFERRING PHYSICIAN: Ladene Artist, MD   DIAGNOSIS: The encounter diagnosis was Rectal cancer [facility]).   HISTORY OF PRESENT ILLNESS: Ronald [name] is a 77 y.o. male with a diagnosis of locally advanced rectal cancer. The patient apparently had a CT scan within the [place] without any known GI abnormalities identified, in July 2023 he had a myocardial infarction he was started on clopidogrel after placement of 2 stents with Dr. Nadara Eaton in cardiology. He had a CT of the chest at the [place] on 06/03/22 that showed a new 6 mm lung nodule in the RLL. He developed rectal bleeding after constipated bowel movements and was seen by Dr. Elnoria Howard.  He underwent colonoscopy on 07/24/2022, this showed a fungating infiltrative mass in the rectum partially circumferential measuring 3 cm in length.  This was noted to extend from the dentate line upward and was felt to be a distal tumor.  Pathology from his procedure confirmed invasive moderately differentiated adenocarcinoma.  His CEA at that time was elevated at 23.9.  By MRI there was invasion of his tumor into the inferior right aspect of the levator ani muscle and abutted the right.  Mesorectal fascia anal sphincter.  His nodal disease was classified as N1 based on perirectal adenopathy but no extra mesorectal disease was identified.  He's also met with Dr. Cliffton Asters in Colorectal surgery.  He was counseled on total neoadjuvant chemotherapy and FOLFOX began on 08/24/22, and to date he's now completed cycle 8 with dose reduction in his FOLFOX on 01/06/23.  Of note he did have a repeat CT CAP on 08/19/2022 that did show an increase in the right lower lobe nodule measuring 8 mm, previously 5.  There was also another nodule in the left upper lobe measuring 4 mm, previously 2  mm.  However CT chest abdomen pelvis on 12/16/2022 showed a similar appearance of the low rectal mass and decreased size of scattered small bilateral pulmonary nodules without any new or suspicious nodules present.  No metastatic disease was appreciated.  The plan is to follow his pulmonary nodules closely. He's seen today to review chemoradiation.    PREVIOUS RADIATION THERAPY: No   PAST MEDICAL HISTORY:  Past Medical History:  Diagnosis Date   Bronchitis    Cancer (HCC)    colon   History of kidney stones    Hyperlipidemia    Hypertension    Myocardial infarction [facility])        PAST SURGICAL HISTORY: Past Surgical History:  Procedure Laterality Date   BIOPSY  07/24/2022   Procedure: BIOPSY;  Surgeon: Jeani Hawking, MD;  Location: [facility];  Service: Gastroenterology;;   COLONOSCOPY WITH PROPOFOL N/A 07/24/2022   Procedure: COLONOSCOPY WITH PROPOFOL;  Surgeon: Jeani Hawking, MD;  Location: [facility];  Service: Gastroenterology;  Laterality: N/A;   CORONARY STENT INTERVENTION N/A 12/23/2021   Procedure: CORONARY STENT INTERVENTION;  Surgeon: [name], MD;  Location: [facility] INVASIVE CV LAB;  Service: Cardiovascular;  Laterality: N/A;   LEFT HEART CATH AND CORONARY ANGIOGRAPHY N/A 11/19/2021   Procedure: LEFT HEART CATH AND CORONARY ANGIOGRAPHY;  Surgeon: Elder Negus, MD;  Location: [facility] INVASIVE CV LAB;  Service: Cardiovascular;  Laterality: N/A;   LEFT HEART CATHETERIZATION WITH  CORONARY ANGIOGRAM N/A 01/15/2014   Procedure: LEFT HEART CATHETERIZATION WITH CORONARY ANGIOGRAM;  Surgeon: Micheline Chapman, MD;  Location: [facility] CATH LAB;  Service: Cardiovascular;  Laterality: N/A;   PORTACATH PLACEMENT N/A 08/18/2022   Procedure: INSERTION PORT-A-CATH;  Surgeon: Andria Meuse, MD;  Location: [facility] ORS;  Service: General;  Laterality: N/A;  60     FAMILY HISTORY: History reviewed. No pertinent family history.   SOCIAL HISTORY:  reports that he has been smoking cigars. He has  never used smokeless tobacco. He reports current alcohol use. He reports that he does not use drugs. The patient is married and lives in [place]. 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 He has an adult child who lives in [place].    ALLERGIES: Angiotensin receptor blockers, Beta adrenergic blockers, and Zestril [lisinopril]   MEDICATIONS:  Current Outpatient Medications  Medication Sig Dispense Refill   amLODipine (NORVASC) 5 MG tablet Take 1 tablet (5 mg total) by mouth daily at 10 pm. 30 tablet 3   aspirin 81 MG EC tablet Take 1 tablet (81 mg total) by mouth daily. 30 tablet 3   atorvastatin (LIPITOR) 80 MG tablet Take 1 tablet (80 mg total) by mouth at bedtime. 90 tablet 3   bismuth subsalicylate (PEPTO BISMOL) 262 MG/15ML suspension Take 30 mLs by mouth every 6 (six) hours as needed.     cetirizine (ZYRTEC) 10 MG tablet Take 10 mg by mouth daily as needed for allergies.     cloNIDine (CATAPRES) 0.3 MG tablet Take 1 tablet (0.3 mg total) by mouth 2 (two) times daily. 180 tablet 3   clopidogrel (PLAVIX) 75 MG tablet Take 75 mg by mouth daily.     dexamethasone (DECADRON) 4 MG tablet Take 1 tablet (4 mg total) by mouth 2 (two) times daily. Take x 3 days after each chemotherapy beginning the day after treatment. 12 tablet 1   diphenhydramine-acetaminophen (TYLENOL PM) 25-500 MG TABS tablet Take 2 tablets by mouth at bedtime.     Docusate Sodium (COLACE PO) Take 2 capsules by mouth at bedtime. (Patient not taking: Reported on 12/08/2022)     ezetimibe (ZETIA) 10 MG tablet Take 1 tablet (10 mg total) by mouth daily. 100 tablet 3   isosorbide mononitrate (IMDUR) 30 MG 24 hr tablet Take 1 tablet (30 mg total) by mouth every morning. 90 tablet 1   lidocaine-prilocaine (EMLA) cream Apply 1 Application topically as needed (Use as directed to apply to port area 1-2 hours prior to appt). 30 g 0   loperamide (IMODIUM A-D) 2 MG tablet Take 1-2  tablets (2-4 mg total) by mouth 4 (four) times daily as needed for diarrhea or loose stools (Up to 8tabs/day).     MAGNESIUM PO Take 1 tablet by mouth at bedtime.     metoprolol succinate (TOPROL-XL) 25 MG 24 hr tablet Take 1 tablet (25 mg total) by mouth daily. 90 tablet 3   nitroGLYCERIN (NITROSTAT) 0.4 MG SL tablet Place 1 tablet (0.4 mg total) under the tongue every 5 (five) minutes x 3 doses as needed for chest pain. (Patient not taking: Reported on 08/05/2022) 25 tablet 2   Omega-3 Fatty Acids (FISH OIL PO) Take 1 capsule by mouth daily. (Patient not taking: Reported on 10/05/2022)     omeprazole (PRILOSEC OTC) 20 MG tablet Take 1 tablet (20 mg total) by mouth daily. 30 tablet 3   ondansetron (ZOFRAN) 8 MG tablet TAKE ONE TABLET  BY MOUTH EVERY 8 HOURS AS NEEDED (MAY USE FOR NAUSEA 3 DAYS AFTER CHEMO) 30 tablet 1   polyethylene glycol (MIRALAX / GLYCOLAX) 17 g packet Take 17 g by mouth 2 (two) times daily.     potassium chloride SA (KLOR-CON M) 20 MEQ tablet Take 1 tablet (20 mEq total) by mouth 2 (two) times daily. 60 tablet 2   Probiotic Product (RESTORA) CAPS Take 1 capsule by mouth daily. (Patient not taking: Reported on 10/05/2022)     prochlorperazine (COMPAZINE) 10 MG tablet Take 1 tablet (10 mg total) by mouth every 6 (six) hours as needed for nausea or vomiting. 30 tablet 0   tamsulosin (FLOMAX) 0.4 MG CAPS capsule Take 0.4 mg by mouth 2 (two) times daily.     TURMERIC PO Take 2 tablets by mouth daily.     No current facility-administered medications for this encounter.   Facility-Administered Medications Ordered in Other Encounters  Medication Dose Route Frequency Provider Last Rate Last Admin   sodium chloride flush (NS) 0.9 % injection 10 mL  10 mL Intravenous PRN Ladene Artist, MD   10 mL at 09/21/22 0946     REVIEW OF SYSTEMS: On review of systems, the patient reports that he is having some fullness and heaviness in the pelvis and some constipation. He denies any concerns with  rectal bleeding. He has noted fatigue. He has some soreness of his mouth without ulcers.       PHYSICAL EXAM:  Wt Readings from Last 3 Encounters:  01/08/23 161 lb (73 kg)  01/05/23 159 lb 3.2 oz (72.2 kg)  12/28/22 162 lb 11.2 oz (73.8 kg)   Temp Readings from Last 3 Encounters:  01/08/23 98.2 F (36.8 C) (Skin)  01/06/23 98.2 F (36.8 C) (Skin)  01/05/23 98.2 F (36.8 C) (Oral)   BP Readings from Last 3 Encounters:  01/08/23 121/67  01/06/23 108/66  01/05/23 136/79   Pulse Readings from Last 3 Encounters:  01/08/23 71  01/06/23 71  01/05/23 96   Pain Assessment Pain Score: 0-No pain/10  In general this is a well appearing caucasian male in no acute distress. He's alert and oriented x4 and appropriate throughout the examination. Cardiopulmonary assessment is negative for acute distress and he exhibits normal effort.     ECOG = 1  0 - Asymptomatic (Fully active, able to carry on all predisease activities without restriction)  1 - Symptomatic but completely ambulatory (Restricted in physically strenuous activity but ambulatory and able to carry out work of a light or sedentary nature. For example, light housework, office work)  2 - Symptomatic, <50% in bed during the day (Ambulatory and capable of all self care but unable to carry out any work activities. Up and about more than 50% of waking hours)  3 - Symptomatic, >50% in bed, but not bedbound (Capable of only limited self-care, confined to bed or chair 50% or more of waking hours)  4 - Bedbound (Completely disabled. Cannot carry on any self-care. Totally confined to bed or chair)  5 - Death   Santiago Glad MM, Creech RH, Tormey DC, et al. [phone]). "Toxicity and response criteria of the [facility]". Am. Evlyn Clines. Oncol. 5 (6): 649-55    LABORATORY DATA:  Lab Results  Component Value Date   WBC 8.6 01/05/2023   HGB 11.3 (L) 01/05/2023   HCT 34.4 (L) 01/05/2023   MCV 99.7 01/05/2023   PLT 178  01/05/2023   Lab Results  Component Value  Date   NA 139 01/05/2023   K 3.7 01/05/2023   CL 106 01/05/2023   CO2 25 01/05/2023   Lab Results  Component Value Date   ALT 16 01/05/2023   AST 21 01/05/2023   ALKPHOS 80 01/05/2023   BILITOT 0.4 01/05/2023      RADIOGRAPHY: No results found.     IMPRESSION/PLAN: 1. Stage IIIC, cT4bN1M0, Adenocarcinoma of the distal rectum. Dr. Mitzi Hansen has reviewed the patient's course since her last visit.  He has now completed total neoadjuvant chemotherapy and is ready to proceed with  chemoRT, and then surgical resection. We reviewed risks, benefits, short, and long term effects of radiotherapy, as well as the curative intent, and the patient is interested in proceeding.  We reviewed the delivery and logistics of radiotherapy and that Dr. Mitzi Hansen recommends 5 1/2 weeks of radiotherapy to the rectum and regional lymph nodes of the pelvis. Written consent is obtained and placed in the chart, a copy was provided to the patient.  He will simulate on*** 2. RLL lung nodule and LUL nodule.  These have overall decreased without any suspicious nodules on his most recent imaging but will be followed closely. He is especially wanting these followed given a history of asbestosis and was at [facility] during the time of water contamination. 3. Risks of pelvic floor dysfunction from radiotherapy. We reviewed the importance of evaluation with physical therapy prior to pelvic radiation. ***   In a visit lasting 60 minutes, greater than 50% of the time was spent face to face discussing the patient's condition, in preparation for the discussion, and coordinating the patient's care.   The above documentation reflects my direct findings during this shared patient visit. Please see the separate note by Dr. Mitzi Hansen on this date for the remainder of the patient's plan of care.    [name], [facility]   **Disclaimer: This note was dictated with voice recognition software.  Similar sounding words can inadvertently be transcribed and this note may contain transcription errors which may not have been corrected upon publication of note.**

## 2023-01-20 ENCOUNTER — Ambulatory Visit
Admission: RE | Admit: 2023-01-20 | Discharge: 2023-01-20 | Disposition: A | Discharge status: Home / Self Care | Payer: Medicare Other | Source: Ambulatory Visit | Attending: Radiation Oncology | Admitting: Radiation Oncology

## 2023-01-20 DIAGNOSIS — C20 Malignant neoplasm of rectum: Secondary | ICD-10-CM

## 2023-01-21 ENCOUNTER — Inpatient Hospital Stay: Payer: No Typology Code available for payment source | Admitting: Nurse Practitioner

## 2023-01-21 ENCOUNTER — Inpatient Hospital Stay: Payer: No Typology Code available for payment source

## 2023-01-21 ENCOUNTER — Encounter: Payer: Self-pay | Admitting: Nurse Practitioner

## 2023-01-21 VITALS — BP 139/68 | HR 81 | Temp 98.1°F | Resp 18 | Ht 72.0 in | Wt 160.9 lb

## 2023-01-21 DIAGNOSIS — C20 Malignant neoplasm of rectum: Secondary | ICD-10-CM

## 2023-01-21 DIAGNOSIS — Z5111 Encounter for antineoplastic chemotherapy: Secondary | ICD-10-CM | POA: Diagnosis not present

## 2023-01-21 DIAGNOSIS — Z95828 Presence of other vascular implants and grafts: Secondary | ICD-10-CM

## 2023-01-21 LAB — CMP (CANCER CENTER ONLY)
ALT: 13 U/L (ref 0–44)
AST: 15 U/L (ref 15–41)
Albumin: 4.2 g/dL (ref 3.5–5.0)
Alkaline Phosphatase: 117 U/L (ref 38–126)
Anion gap: 7 (ref 5–15)
BUN: 12 mg/dL (ref 8–23)
CO2: 25 mmol/L (ref 22–32)
Calcium: 8.9 mg/dL (ref 8.9–10.3)
Chloride: 106 mmol/L (ref 98–111)
Creatinine: 0.89 mg/dL (ref 0.61–1.24)
GFR, Estimated: >60 mL/min (ref >60)
Glucose, Bld: 157 mg/dL — ABNORMAL HIGH (ref 70–99)
Potassium: 3.6 mmol/L (ref 3.5–5.1)
Sodium: 138 mmol/L (ref 135–145)
Total Bilirubin: 0.3 mg/dL (ref 0.3–1.2)
Total Protein: 6.9 g/dL (ref 6.5–8.1)

## 2023-01-21 LAB — CBC WITH DIFFERENTIAL (CANCER CENTER ONLY)
Abs Immature Granulocytes: 0.1 10*3/uL — ABNORMAL HIGH (ref 0.00–0.07)
Basophils Absolute: 0 10*3/uL (ref 0.0–0.1)
Basophils Relative: 0 %
Eosinophils Absolute: 0.1 10*3/uL (ref 0.0–0.5)
Eosinophils Relative: 1 %
HCT: 34.3 % — ABNORMAL LOW (ref 39.0–52.0)
Hemoglobin: 11.4 g/dL — ABNORMAL LOW (ref 13.0–17.0)
Immature Granulocytes: 1 %
Lymphocytes Relative: 13 %
Lymphs Abs: 2.1 10*3/uL (ref 0.7–4.0)
MCH: 32.7 pg (ref 26.0–34.0)
MCHC: 33.2 g/dL (ref 30.0–36.0)
MCV: 98.3 fL (ref 80.0–100.0)
Monocytes Absolute: 0.9 10*3/uL (ref 0.1–1.0)
Monocytes Relative: 5 %
Neutro Abs: 13.5 10*3/uL — ABNORMAL HIGH (ref 1.7–7.7)
Neutrophils Relative %: 80 %
Platelet Count: 149 10*3/uL — ABNORMAL LOW (ref 150–400)
RBC: 3.49 MIL/uL — ABNORMAL LOW (ref 4.22–5.81)
RDW: 14.4 % (ref 11.5–15.5)
WBC Count: 16.8 10*3/uL — ABNORMAL HIGH (ref 4.0–10.5)
nRBC: 0 % (ref 0.0–0.2)

## 2023-01-21 LAB — MAGNESIUM: Magnesium: 1.8 mg/dL (ref 1.7–2.4)

## 2023-01-21 MED ORDER — SODIUM CHLORIDE 0.9% FLUSH
10.0000 mL | INTRAVENOUS | Status: DC | PRN
  Administered 2023-01-21: 10 mL via INTRAVENOUS

## 2023-01-21 MED ORDER — HEPARIN SOD (PORK) LOCK FLUSH 100 UNIT/ML IV SOLN
500.0000 [IU] | Freq: Once | INTRAVENOUS | Status: AC
  Administered 2023-01-21: 500 [IU] via INTRAVENOUS

## 2023-01-21 NOTE — Progress Notes (Signed)
[facility] Center OFFICE PROGRESS NOTE   Diagnosis: Rectal cancer  INTERVAL HISTORY:   Ronald [name] returns as scheduled.  He completed cycle 8 FOLFOX 01/05/2023.  He denies significant nausea.  No mouth sores.  He did not have diarrhea.  No cold sensitivity.  No numbness or tingling in the hands or feet today.  He reports seeing oncology at the [facility] recently.  Xeloda has been prescribed.  Objective:  Vital signs in last 24 hours:  Blood pressure 139/68, pulse 81, temperature 98.1 F (36.7 C), temperature source Temporal, resp. rate 18, height 6' (1.829 m), weight 160 lb 14.4 oz (73 kg), SpO2 93%.    HEENT: No thrush or ulcers. Resp: Lungs clear bilaterally. Cardio: Regular rate and rhythm. GI: No hepatosplenomegaly. Vascular: No leg edema. Port-A-Cath without erythema.  Lab Results:  Lab Results  Component Value Date   WBC 16.8 (H) 01/21/2023   HGB 11.4 (L) 01/21/2023   HCT 34.3 (L) 01/21/2023   MCV 98.3 01/21/2023   PLT 149 (L) 01/21/2023   NEUTROABS 13.5 (H) 01/21/2023    Imaging:  No results found.  Medications: I have reviewed the patient's current medications.  Assessment/Plan: Rectal cancer Colonoscopy 07/24/2022-mass in the posterior rectum extending from the dentate line for 3 cm, biopsy-moderately differentiated invasive adenocarcinoma, mismatch repair protein expression intact CT abdomen/pelvis 04/07/2022-cardiac enlargement, hepatic steatosis mild left adrenal nodularity stable compared to chest CT March 2020, small nonobstructing bilateral renal calculi, extensive colonic diverticula CT chest 06/03/2022-6 mm nodule in the superior right lower lobe-new from 07/12/2018, new 1-2 mm nodule within the left upper lobe Elevated CEA 07/24/2022 MRI pelvis 08/06/2022-tumor at 3.5 cm from the anal verge with involvement of the upper anal sphincter, invasion of the inferior right levator ani muscle, T4b, one 5 mm left posterior perirectal node-in 1 CTs  08/19/2022-partially circumferential mass of the low rectum.  Small bilateral pulmonary nodules slightly enlarged compared to prior examination.  Plan for follow-up CT chest after completing 6 cycles of chemotherapy. Cycle 1 FOLFOX 08/24/2022 Cycle 2 FOLFOX 09/07/2022 Chemotherapy held 09/21/2022 due to severe neutropenia, thrombocytopenia Cycle 3 FOLFOX 10/05/2022, Udenyca, oxaliplatin dose reduced secondary to thrombocytopenia following cycle 2 Treatment held 10/20/2022 due to thrombocytopenia Cycle 4 FOLFOX 10/27/2022, Udenyca, oxaliplatin dose reduced secondary to thrombocytopenia following cycle 3 Cycle 5 FOLFOX 11/09/2022, Udenyca Cycle 6 FOLFOX 11/24/2022, Udenyca Cycle 7 FOLFOX 12/08/2022, Udenyca CTs 12/16/2022-similar appearance of circumferential low rectal wall mass; decreased size of scattered small bilateral pulmonary nodules.  No new suspicious pulmonary nodules or masses.  No evidence of metastatic disease in the abdomen or pelvis. 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  He completed the eighth and final cycle of FOLFOX 01/05/2023.  He has seen radiation oncology.  Anticipated start date of radiation 02/08/2023.  We had discussed Xeloda at a previous office visit.  We reviewed side effects again today.  He reports the [place] oncologist will be prescribing Xeloda and following him throughout the course of radiation.  He will return for lab and follow-up the week of 02/22/2023.  We will try to coordinate his care with the [place].  Patient seen with Dr. Truett Perna.  Duward Hickox ANP/GNP-BC   01/21/2023  11:35 AM  This was a shared visit with Lonna Cobb.  Mr.  [name] has completed the course of neoadjuvant FOLFOX.  He will  begin concurrent capecitabine and radiation in approximately 2 weeks.  We reviewed potential toxicities associated with capecitabine.  He knows to contact us for diarrhea or hand/foot symptoms.  [facility].  He will undergo a restaging evaluation prior to rectal surgery.  I was present for greater than 50% of today's visit.  I performed Medical Decision Making.  Mancel Bale, MD

## 2023-01-23 ENCOUNTER — Other Ambulatory Visit: Payer: Self-pay

## 2023-01-25 ENCOUNTER — Encounter: Payer: Self-pay | Admitting: Oncology

## 2023-01-27 ENCOUNTER — Ambulatory Visit: Payer: Non-veteran care | Admitting: Cardiology

## 2023-01-30 ENCOUNTER — Other Ambulatory Visit: Payer: Self-pay

## 2023-02-01 ENCOUNTER — Ambulatory Visit: Payer: No Typology Code available for payment source | Admitting: Radiation Oncology

## 2023-02-01 ENCOUNTER — Telehealth: Payer: Self-pay | Admitting: Radiation Oncology

## 2023-02-01 NOTE — Telephone Encounter (Signed)
Pt called asking about wanting to move tomorrow's CT SIM appt. Pt was transferred to [facility] for discussion.

## 2023-02-02 ENCOUNTER — Ambulatory Visit: Payer: No Typology Code available for payment source | Admitting: Radiation Oncology

## 2023-02-03 ENCOUNTER — Ambulatory Visit
Admission: RE | Admit: 2023-02-03 | Discharge: 2023-02-03 | Disposition: A | Discharge status: Home / Self Care | Payer: No Typology Code available for payment source | Source: Ambulatory Visit | Attending: Radiation Oncology | Admitting: Radiation Oncology

## 2023-02-03 DIAGNOSIS — Z510 Encounter for antineoplastic radiation therapy: Secondary | ICD-10-CM | POA: Diagnosis present

## 2023-02-03 DIAGNOSIS — C20 Malignant neoplasm of rectum: Secondary | ICD-10-CM | POA: Diagnosis not present

## 2023-02-06 DIAGNOSIS — Z510 Encounter for antineoplastic radiation therapy: Secondary | ICD-10-CM | POA: Diagnosis not present

## 2023-02-08 ENCOUNTER — Other Ambulatory Visit: Payer: Self-pay

## 2023-02-08 ENCOUNTER — Ambulatory Visit
Admission: RE | Admit: 2023-02-08 | Discharge: 2023-02-08 | Disposition: A | Discharge status: Home / Self Care | Payer: No Typology Code available for payment source | Source: Ambulatory Visit | Attending: Radiation Oncology | Admitting: Radiation Oncology

## 2023-02-08 DIAGNOSIS — Z510 Encounter for antineoplastic radiation therapy: Secondary | ICD-10-CM | POA: Diagnosis not present

## 2023-02-08 LAB — RAD ONC ARIA SESSION SUMMARY
Course Elapsed Days: 0
Plan Fractions Treated to Date: 1
Plan Prescribed Dose Per Fraction: 1.8 Gy
Plan Total Fractions Prescribed: 25
Plan Total Prescribed Dose: 45 Gy
Reference Point Dosage Given to Date: 1.8 Gy
Reference Point Session Dosage Given: 1.8 Gy
Session Number: 1

## 2023-02-09 ENCOUNTER — Ambulatory Visit
Admission: RE | Admit: 2023-02-09 | Discharge: 2023-02-09 | Disposition: A | Discharge status: Home / Self Care | Payer: No Typology Code available for payment source | Source: Ambulatory Visit | Attending: Radiation Oncology | Admitting: Radiation Oncology

## 2023-02-09 ENCOUNTER — Other Ambulatory Visit: Payer: Self-pay

## 2023-02-09 DIAGNOSIS — Z510 Encounter for antineoplastic radiation therapy: Secondary | ICD-10-CM | POA: Diagnosis not present

## 2023-02-09 LAB — RAD ONC ARIA SESSION SUMMARY
Course Elapsed Days: 1
Plan Fractions Treated to Date: 2
Plan Prescribed Dose Per Fraction: 1.8 Gy
Plan Total Fractions Prescribed: 25
Plan Total Prescribed Dose: 45 Gy
Reference Point Dosage Given to Date: 3.6 Gy
Reference Point Session Dosage Given: 1.8 Gy
Session Number: 2

## 2023-02-10 ENCOUNTER — Ambulatory Visit
Admission: RE | Admit: 2023-02-10 | Discharge: 2023-02-10 | Disposition: A | Discharge status: Home / Self Care | Payer: No Typology Code available for payment source | Source: Ambulatory Visit | Attending: Radiation Oncology

## 2023-02-10 ENCOUNTER — Other Ambulatory Visit: Payer: Self-pay

## 2023-02-10 DIAGNOSIS — Z510 Encounter for antineoplastic radiation therapy: Secondary | ICD-10-CM | POA: Diagnosis not present

## 2023-02-10 LAB — RAD ONC ARIA SESSION SUMMARY
Course Elapsed Days: 2
Plan Fractions Treated to Date: 3
Plan Prescribed Dose Per Fraction: 1.8 Gy
Plan Total Fractions Prescribed: 25
Plan Total Prescribed Dose: 45 Gy
Reference Point Dosage Given to Date: 5.4 Gy
Reference Point Session Dosage Given: 1.8 Gy
Session Number: 3

## 2023-02-11 ENCOUNTER — Other Ambulatory Visit: Payer: Self-pay

## 2023-02-11 ENCOUNTER — Ambulatory Visit
Admission: RE | Admit: 2023-02-11 | Discharge: 2023-02-11 | Disposition: A | Discharge status: Home / Self Care | Payer: No Typology Code available for payment source | Source: Ambulatory Visit | Attending: Radiation Oncology | Admitting: Radiation Oncology

## 2023-02-11 DIAGNOSIS — Z510 Encounter for antineoplastic radiation therapy: Secondary | ICD-10-CM | POA: Diagnosis not present

## 2023-02-11 LAB — RAD ONC ARIA SESSION SUMMARY
Course Elapsed Days: 3
Plan Fractions Treated to Date: 4
Plan Prescribed Dose Per Fraction: 1.8 Gy
Plan Total Fractions Prescribed: 25
Plan Total Prescribed Dose: 45 Gy
Reference Point Dosage Given to Date: 7.2 Gy
Reference Point Session Dosage Given: 1.8 Gy
Session Number: 4

## 2023-02-12 ENCOUNTER — Ambulatory Visit
Admission: RE | Admit: 2023-02-12 | Discharge: 2023-02-12 | Disposition: A | Discharge status: Home / Self Care | Payer: No Typology Code available for payment source | Source: Ambulatory Visit | Attending: Radiation Oncology | Admitting: Radiation Oncology

## 2023-02-12 ENCOUNTER — Other Ambulatory Visit: Payer: Self-pay

## 2023-02-12 DIAGNOSIS — C20 Malignant neoplasm of rectum: Secondary | ICD-10-CM

## 2023-02-12 DIAGNOSIS — Z510 Encounter for antineoplastic radiation therapy: Secondary | ICD-10-CM | POA: Diagnosis not present

## 2023-02-12 LAB — RAD ONC ARIA SESSION SUMMARY
Course Elapsed Days: 4
Plan Fractions Treated to Date: 5
Plan Prescribed Dose Per Fraction: 1.8 Gy
Plan Total Fractions Prescribed: 25
Plan Total Prescribed Dose: 45 Gy
Reference Point Dosage Given to Date: 9 Gy
Reference Point Session Dosage Given: 1.8 Gy
Session Number: 5

## 2023-02-12 MED ORDER — SONAFINE EX EMUL
1.0000 | Freq: Once | CUTANEOUS | Status: AC
  Administered 2023-02-12: 1 via TOPICAL

## 2023-02-15 ENCOUNTER — Ambulatory Visit
Admission: RE | Admit: 2023-02-15 | Discharge: 2023-02-15 | Disposition: A | Discharge status: Home / Self Care | Payer: No Typology Code available for payment source | Source: Ambulatory Visit | Attending: Radiation Oncology

## 2023-02-15 ENCOUNTER — Telehealth: Payer: Self-pay | Admitting: Cardiology

## 2023-02-15 ENCOUNTER — Other Ambulatory Visit: Payer: Self-pay

## 2023-02-15 DIAGNOSIS — Z510 Encounter for antineoplastic radiation therapy: Secondary | ICD-10-CM | POA: Diagnosis not present

## 2023-02-15 LAB — RAD ONC ARIA SESSION SUMMARY
Course Elapsed Days: 7
Plan Fractions Treated to Date: 6
Plan Prescribed Dose Per Fraction: 1.8 Gy
Plan Total Fractions Prescribed: 25
Plan Total Prescribed Dose: 45 Gy
Reference Point Dosage Given to Date: 10.8 Gy
Reference Point Session Dosage Given: 1.8 Gy
Session Number: 6

## 2023-02-15 NOTE — Telephone Encounter (Signed)
Wife Misty Stanley) called to confirm patient's appointment on 3/5 has been approved with the [place].

## 2023-02-16 ENCOUNTER — Other Ambulatory Visit: Payer: Self-pay

## 2023-02-16 ENCOUNTER — Ambulatory Visit
Admission: RE | Admit: 2023-02-16 | Discharge: 2023-02-16 | Disposition: A | Discharge status: Home / Self Care | Payer: No Typology Code available for payment source | Source: Ambulatory Visit | Attending: Radiation Oncology

## 2023-02-16 DIAGNOSIS — Z510 Encounter for antineoplastic radiation therapy: Secondary | ICD-10-CM | POA: Diagnosis not present

## 2023-02-16 LAB — RAD ONC ARIA SESSION SUMMARY
Course Elapsed Days: 8
Plan Fractions Treated to Date: 7
Plan Prescribed Dose Per Fraction: 1.8 Gy
Plan Total Fractions Prescribed: 25
Plan Total Prescribed Dose: 45 Gy
Reference Point Dosage Given to Date: 12.6 Gy
Reference Point Session Dosage Given: 1.8 Gy
Session Number: 7

## 2023-02-16 NOTE — Telephone Encounter (Signed)
Patient has appointment scheduled on 07/07/23.  Wife wants to confirm patient's appointment is authorized with [facility]

## 2023-02-17 ENCOUNTER — Other Ambulatory Visit: Payer: Self-pay

## 2023-02-17 ENCOUNTER — Ambulatory Visit
Admission: RE | Admit: 2023-02-17 | Discharge: 2023-02-17 | Disposition: A | Discharge status: Home / Self Care | Payer: No Typology Code available for payment source | Source: Ambulatory Visit | Attending: Radiation Oncology

## 2023-02-17 ENCOUNTER — Telehealth: Payer: Self-pay | Admitting: Hematology and Oncology

## 2023-02-17 DIAGNOSIS — Z510 Encounter for antineoplastic radiation therapy: Secondary | ICD-10-CM | POA: Diagnosis not present

## 2023-02-17 LAB — RAD ONC ARIA SESSION SUMMARY
Course Elapsed Days: 9
Plan Fractions Treated to Date: 8
Plan Prescribed Dose Per Fraction: 1.8 Gy
Plan Total Fractions Prescribed: 25
Plan Total Prescribed Dose: 45 Gy
Reference Point Dosage Given to Date: 14.4 Gy
Reference Point Session Dosage Given: 1.8 Gy
Session Number: 8

## 2023-02-18 ENCOUNTER — Ambulatory Visit
Admission: RE | Admit: 2023-02-18 | Discharge: 2023-02-18 | Disposition: A | Discharge status: Home / Self Care | Payer: No Typology Code available for payment source | Source: Ambulatory Visit | Attending: Radiation Oncology | Admitting: Radiation Oncology

## 2023-02-18 ENCOUNTER — Other Ambulatory Visit: Payer: Self-pay

## 2023-02-18 DIAGNOSIS — Z510 Encounter for antineoplastic radiation therapy: Secondary | ICD-10-CM | POA: Diagnosis not present

## 2023-02-18 LAB — RAD ONC ARIA SESSION SUMMARY
Course Elapsed Days: 10
Plan Fractions Treated to Date: 9
Plan Prescribed Dose Per Fraction: 1.8 Gy
Plan Total Fractions Prescribed: 25
Plan Total Prescribed Dose: 45 Gy
Reference Point Dosage Given to Date: 16.2 Gy
Reference Point Session Dosage Given: 1.8 Gy
Session Number: 9

## 2023-02-19 ENCOUNTER — Ambulatory Visit
Admission: RE | Admit: 2023-02-19 | Discharge: 2023-02-19 | Disposition: A | Discharge status: Home / Self Care | Payer: No Typology Code available for payment source | Source: Ambulatory Visit | Attending: Radiation Oncology

## 2023-02-19 ENCOUNTER — Ambulatory Visit
Admission: RE | Admit: 2023-02-19 | Discharge: 2023-02-19 | Disposition: A | Discharge status: Home / Self Care | Payer: No Typology Code available for payment source | Source: Ambulatory Visit | Attending: Radiation Oncology | Admitting: Radiation Oncology

## 2023-02-19 ENCOUNTER — Other Ambulatory Visit: Payer: Self-pay

## 2023-02-19 DIAGNOSIS — Z510 Encounter for antineoplastic radiation therapy: Secondary | ICD-10-CM | POA: Diagnosis not present

## 2023-02-19 LAB — RAD ONC ARIA SESSION SUMMARY
Course Elapsed Days: 11
Plan Fractions Treated to Date: 10
Plan Prescribed Dose Per Fraction: 1.8 Gy
Plan Total Fractions Prescribed: 25
Plan Total Prescribed Dose: 45 Gy
Reference Point Dosage Given to Date: 18 Gy
Reference Point Session Dosage Given: 1.8 Gy
Session Number: 10

## 2023-02-22 ENCOUNTER — Ambulatory Visit
Admission: RE | Admit: 2023-02-22 | Discharge: 2023-02-22 | Disposition: A | Discharge status: Home / Self Care | Payer: No Typology Code available for payment source | Source: Ambulatory Visit | Attending: Radiation Oncology

## 2023-02-22 ENCOUNTER — Other Ambulatory Visit: Payer: Self-pay

## 2023-02-22 DIAGNOSIS — Z510 Encounter for antineoplastic radiation therapy: Secondary | ICD-10-CM | POA: Diagnosis not present

## 2023-02-22 LAB — RAD ONC ARIA SESSION SUMMARY
Course Elapsed Days: 14
Plan Fractions Treated to Date: 11
Plan Prescribed Dose Per Fraction: 1.8 Gy
Plan Total Fractions Prescribed: 25
Plan Total Prescribed Dose: 45 Gy
Reference Point Dosage Given to Date: 19.8 Gy
Reference Point Session Dosage Given: 1.8 Gy
Session Number: 11

## 2023-02-23 ENCOUNTER — Other Ambulatory Visit: Payer: Self-pay

## 2023-02-23 ENCOUNTER — Ambulatory Visit
Admission: RE | Admit: 2023-02-23 | Discharge: 2023-02-23 | Disposition: A | Discharge status: Home / Self Care | Payer: No Typology Code available for payment source | Source: Ambulatory Visit | Attending: Radiation Oncology | Admitting: Radiation Oncology

## 2023-02-23 DIAGNOSIS — Z510 Encounter for antineoplastic radiation therapy: Secondary | ICD-10-CM | POA: Diagnosis not present

## 2023-02-23 LAB — RAD ONC ARIA SESSION SUMMARY
Course Elapsed Days: 15
Plan Fractions Treated to Date: 12
Plan Prescribed Dose Per Fraction: 1.8 Gy
Plan Total Fractions Prescribed: 25
Plan Total Prescribed Dose: 45 Gy
Reference Point Dosage Given to Date: 21.6 Gy
Reference Point Session Dosage Given: 1.8 Gy
Session Number: 12

## 2023-02-24 ENCOUNTER — Other Ambulatory Visit: Payer: Self-pay

## 2023-02-24 ENCOUNTER — Ambulatory Visit
Admission: RE | Admit: 2023-02-24 | Discharge: 2023-02-24 | Disposition: A | Discharge status: Home / Self Care | Payer: No Typology Code available for payment source | Source: Ambulatory Visit | Attending: Radiation Oncology

## 2023-02-24 DIAGNOSIS — Z510 Encounter for antineoplastic radiation therapy: Secondary | ICD-10-CM | POA: Diagnosis not present

## 2023-02-24 LAB — RAD ONC ARIA SESSION SUMMARY
Course Elapsed Days: 16
Plan Fractions Treated to Date: 13
Plan Prescribed Dose Per Fraction: 1.8 Gy
Plan Total Fractions Prescribed: 25
Plan Total Prescribed Dose: 45 Gy
Reference Point Dosage Given to Date: 23.4 Gy
Reference Point Session Dosage Given: 1.8 Gy
Session Number: 13

## 2023-02-25 ENCOUNTER — Ambulatory Visit
Admission: RE | Admit: 2023-02-25 | Discharge: 2023-02-25 | Disposition: A | Discharge status: Home / Self Care | Payer: No Typology Code available for payment source | Source: Ambulatory Visit | Attending: Radiation Oncology | Admitting: Radiation Oncology

## 2023-02-25 ENCOUNTER — Other Ambulatory Visit: Payer: Self-pay

## 2023-02-25 ENCOUNTER — Telehealth: Payer: Self-pay | Admitting: *Deleted

## 2023-02-25 DIAGNOSIS — Z510 Encounter for antineoplastic radiation therapy: Secondary | ICD-10-CM | POA: Diagnosis not present

## 2023-02-25 LAB — RAD ONC ARIA SESSION SUMMARY
Course Elapsed Days: 17
Plan Fractions Treated to Date: 14
Plan Prescribed Dose Per Fraction: 1.8 Gy
Plan Total Fractions Prescribed: 25
Plan Total Prescribed Dose: 45 Gy
Reference Point Dosage Given to Date: 25.2 Gy
Reference Point Session Dosage Given: 1.8 Gy
Session Number: 14

## 2023-02-25 NOTE — Telephone Encounter (Signed)
RETURNED PATIENT'S PHONE CALL, LVM FOR A RETURN CALL 

## 2023-02-26 ENCOUNTER — Encounter: Payer: Self-pay | Admitting: Oncology

## 2023-02-26 ENCOUNTER — Ambulatory Visit
Admission: RE | Admit: 2023-02-26 | Discharge: 2023-02-26 | Disposition: A | Discharge status: Home / Self Care | Payer: No Typology Code available for payment source | Source: Ambulatory Visit | Attending: Radiation Oncology

## 2023-02-26 ENCOUNTER — Inpatient Hospital Stay (HOSPITAL_BASED_OUTPATIENT_CLINIC_OR_DEPARTMENT_OTHER): Payer: No Typology Code available for payment source | Admitting: Oncology

## 2023-02-26 ENCOUNTER — Other Ambulatory Visit: Payer: Self-pay

## 2023-02-26 ENCOUNTER — Inpatient Hospital Stay: Payer: No Typology Code available for payment source | Attending: Nurse Practitioner

## 2023-02-26 VITALS — BP 120/65 | HR 72 | Temp 98.2°F | Resp 16 | Wt 162.8 lb

## 2023-02-26 DIAGNOSIS — Z510 Encounter for antineoplastic radiation therapy: Secondary | ICD-10-CM | POA: Diagnosis not present

## 2023-02-26 DIAGNOSIS — C20 Malignant neoplasm of rectum: Secondary | ICD-10-CM | POA: Insufficient documentation

## 2023-02-26 DIAGNOSIS — N400 Enlarged prostate without lower urinary tract symptoms: Secondary | ICD-10-CM | POA: Diagnosis not present

## 2023-02-26 DIAGNOSIS — I2510 Atherosclerotic heart disease of native coronary artery without angina pectoris: Secondary | ICD-10-CM | POA: Insufficient documentation

## 2023-02-26 DIAGNOSIS — R918 Other nonspecific abnormal finding of lung field: Secondary | ICD-10-CM | POA: Insufficient documentation

## 2023-02-26 DIAGNOSIS — I10 Essential (primary) hypertension: Secondary | ICD-10-CM | POA: Insufficient documentation

## 2023-02-26 DIAGNOSIS — E785 Hyperlipidemia, unspecified: Secondary | ICD-10-CM | POA: Diagnosis not present

## 2023-02-26 DIAGNOSIS — F4310 Post-traumatic stress disorder, unspecified: Secondary | ICD-10-CM | POA: Insufficient documentation

## 2023-02-26 LAB — CBC WITH DIFFERENTIAL (CANCER CENTER ONLY)
Abs Immature Granulocytes: 0.01 10*3/uL (ref 0.00–0.07)
Basophils Absolute: 0 10*3/uL (ref 0.0–0.1)
Basophils Relative: 1 %
Eosinophils Absolute: 0.2 10*3/uL (ref 0.0–0.5)
Eosinophils Relative: 6 %
HCT: 34.1 % — ABNORMAL LOW (ref 39.0–52.0)
Hemoglobin: 11.5 g/dL — ABNORMAL LOW (ref 13.0–17.0)
Immature Granulocytes: 0 %
Lymphocytes Relative: 21 %
Lymphs Abs: 0.8 10*3/uL (ref 0.7–4.0)
MCH: 32.2 pg (ref 26.0–34.0)
MCHC: 33.7 g/dL (ref 30.0–36.0)
MCV: 95.5 fL (ref 80.0–100.0)
Monocytes Absolute: 0.6 10*3/uL (ref 0.1–1.0)
Monocytes Relative: 17 %
Neutro Abs: 2.1 10*3/uL (ref 1.7–7.7)
Neutrophils Relative %: 55 %
Platelet Count: 111 10*3/uL — ABNORMAL LOW (ref 150–400)
RBC: 3.57 MIL/uL — ABNORMAL LOW (ref 4.22–5.81)
RDW: 13.2 % (ref 11.5–15.5)
WBC Count: 3.7 10*3/uL — ABNORMAL LOW (ref 4.0–10.5)
nRBC: 0 % (ref 0.0–0.2)

## 2023-02-26 LAB — RAD ONC ARIA SESSION SUMMARY
Course Elapsed Days: 18
Plan Fractions Treated to Date: 15
Plan Prescribed Dose Per Fraction: 1.8 Gy
Plan Total Fractions Prescribed: 25
Plan Total Prescribed Dose: 45 Gy
Reference Point Dosage Given to Date: 27 Gy
Reference Point Session Dosage Given: 1.8 Gy
Session Number: 15

## 2023-02-26 LAB — CMP (CANCER CENTER ONLY)
ALT: 14 U/L (ref 0–44)
AST: 19 U/L (ref 15–41)
Albumin: 4.4 g/dL (ref 3.5–5.0)
Alkaline Phosphatase: 63 U/L (ref 38–126)
Anion gap: 4 — ABNORMAL LOW (ref 5–15)
BUN: 12 mg/dL (ref 8–23)
CO2: 29 mmol/L (ref 22–32)
Calcium: 9.4 mg/dL (ref 8.9–10.3)
Chloride: 104 mmol/L (ref 98–111)
Creatinine: 0.94 mg/dL (ref 0.61–1.24)
GFR, Estimated: >60 mL/min (ref >60)
Glucose, Bld: 107 mg/dL — ABNORMAL HIGH (ref 70–99)
Potassium: 4.1 mmol/L (ref 3.5–5.1)
Sodium: 137 mmol/L (ref 135–145)
Total Bilirubin: 0.6 mg/dL (ref 0.3–1.2)
Total Protein: 6.9 g/dL (ref 6.5–8.1)

## 2023-02-26 NOTE — Progress Notes (Signed)
[facility] OFFICE PROGRESS NOTE   Diagnosis: Rectal cancer  INTERVAL HISTORY:   Ronald [name] returns as scheduled.  He continues Xeloda and radiation.  No mouth sores or hand/foot pain.  He has mild diarrhea improved with Imodium.  He is taking potassium and magnesium.  Objective:  Vital signs in last 24 hours:  There were no vitals taken for this visit.    HEENT: No thrush or ulcers Resp: Lungs clear bilaterally Cardio: Regular rate and rhythm GI: No hepatosplenomegaly, nontender Vascular: No leg edema Skin: Dryness with superficial dry desquamation at the soles  Portacath/PICC-without erythema  Lab Results:  Lab Results  Component Value Date   WBC 3.7 (L) 02/26/2023   HGB 11.5 (L) 02/26/2023   HCT 34.1 (L) 02/26/2023   MCV 95.5 02/26/2023   PLT 111 (L) 02/26/2023   NEUTROABS 2.1 02/26/2023    CMP  Lab Results  Component Value Date   NA 137 02/26/2023   K 4.1 02/26/2023   CL 104 02/26/2023   CO2 29 02/26/2023   GLUCOSE 107 (H) 02/26/2023   BUN 12 02/26/2023   CREATININE 0.94 02/26/2023   CALCIUM 9.4 02/26/2023   PROT 6.9 02/26/2023   ALBUMIN 4.4 02/26/2023   AST 19 02/26/2023   ALT 14 02/26/2023   ALKPHOS 63 02/26/2023   BILITOT 0.6 02/26/2023   GFRNONAA >60 02/26/2023   GFRAA 84 02/10/2019    Lab Results  Component Value Date   CEA1 23.9 (H) 07/24/2022   CEA 3.97 12/23/2022    Lab Results  Component Value Date   INR 0.93 01/14/2014   LABPROT 12.5 01/14/2014    Imaging:  No results found.  Medications: I have reviewed the patient's current medications.   Assessment/Plan: Rectal cancer Colonoscopy 07/24/2022-mass in the posterior rectum extending from the dentate line for 3 cm, biopsy-moderately differentiated invasive adenocarcinoma, mismatch repair protein expression intact CT abdomen/pelvis 04/07/2022-cardiac enlargement, hepatic steatosis mild left adrenal nodularity stable compared to chest CT March 2020, small  nonobstructing bilateral renal calculi, extensive colonic diverticula CT chest 06/03/2022-6 mm nodule in the superior right lower lobe-new from 07/12/2018, new 1-2 mm nodule within the left upper lobe Elevated CEA 07/24/2022 MRI pelvis 08/06/2022-tumor at 3.5 cm from the anal verge with involvement of the upper anal sphincter, invasion of the inferior right levator ani muscle, T4b, one 5 mm left posterior perirectal node-in 1 CTs 08/19/2022-partially circumferential mass of the low rectum.  Small bilateral pulmonary nodules slightly enlarged compared to prior examination.  Plan for follow-up CT chest after completing 6 cycles of chemotherapy. Cycle 1 FOLFOX 08/24/2022 Cycle 2 FOLFOX 09/07/2022 Chemotherapy held 09/21/2022 due to severe neutropenia, thrombocytopenia Cycle 3 FOLFOX 10/05/2022, Udenyca, oxaliplatin dose reduced secondary to thrombocytopenia following cycle 2 Treatment held 10/20/2022 due to thrombocytopenia Cycle 4 FOLFOX 10/27/2022, Udenyca, oxaliplatin dose reduced secondary to thrombocytopenia following cycle 3 Cycle 5 FOLFOX 11/09/2022, Udenyca Cycle 6 FOLFOX 11/24/2022, Udenyca Cycle 7 FOLFOX 12/08/2022, Udenyca CTs 12/16/2022-similar appearance of circumferential low rectal wall mass; decreased size of scattered small bilateral pulmonary nodules.  No new suspicious pulmonary nodules or masses.  No evidence of metastatic disease in the abdomen or pelvis. 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  He will continue the current treatment.  He will return for a CBC in 1 week.  He will be scheduled for an office visit after the completion of radiation.  He will undergo a staging evaluation prior  to considering rectal surgery.  Thornton Papas, MD  02/26/2023  8:58 AM

## 2023-03-01 ENCOUNTER — Other Ambulatory Visit: Payer: Self-pay

## 2023-03-01 ENCOUNTER — Ambulatory Visit
Admission: RE | Admit: 2023-03-01 | Discharge: 2023-03-01 | Disposition: A | Discharge status: Home / Self Care | Payer: No Typology Code available for payment source | Source: Ambulatory Visit | Attending: Radiation Oncology | Admitting: Radiation Oncology

## 2023-03-01 DIAGNOSIS — Z510 Encounter for antineoplastic radiation therapy: Secondary | ICD-10-CM | POA: Diagnosis not present

## 2023-03-01 LAB — RAD ONC ARIA SESSION SUMMARY
Course Elapsed Days: 21
Plan Fractions Treated to Date: 16
Plan Prescribed Dose Per Fraction: 1.8 Gy
Plan Total Fractions Prescribed: 25
Plan Total Prescribed Dose: 45 Gy
Reference Point Dosage Given to Date: 28.8 Gy
Reference Point Session Dosage Given: 1.8 Gy
Session Number: 16

## 2023-03-02 ENCOUNTER — Other Ambulatory Visit: Payer: Self-pay

## 2023-03-02 ENCOUNTER — Ambulatory Visit
Admission: RE | Admit: 2023-03-02 | Discharge: 2023-03-02 | Disposition: A | Discharge status: Home / Self Care | Payer: No Typology Code available for payment source | Source: Ambulatory Visit | Attending: Radiation Oncology | Admitting: Radiation Oncology

## 2023-03-02 DIAGNOSIS — Z510 Encounter for antineoplastic radiation therapy: Secondary | ICD-10-CM | POA: Diagnosis not present

## 2023-03-02 LAB — RAD ONC ARIA SESSION SUMMARY
Course Elapsed Days: 22
Plan Fractions Treated to Date: 17
Plan Prescribed Dose Per Fraction: 1.8 Gy
Plan Total Fractions Prescribed: 25
Plan Total Prescribed Dose: 45 Gy
Reference Point Dosage Given to Date: 30.6 Gy
Reference Point Session Dosage Given: 1.8 Gy
Session Number: 17

## 2023-03-03 ENCOUNTER — Ambulatory Visit
Admission: RE | Admit: 2023-03-03 | Discharge: 2023-03-03 | Disposition: A | Discharge status: Home / Self Care | Payer: No Typology Code available for payment source | Source: Ambulatory Visit | Attending: Radiation Oncology | Admitting: Radiation Oncology

## 2023-03-03 ENCOUNTER — Encounter: Payer: Self-pay | Admitting: Oncology

## 2023-03-03 ENCOUNTER — Encounter: Payer: Self-pay | Admitting: Radiation Oncology

## 2023-03-03 ENCOUNTER — Other Ambulatory Visit: Payer: Self-pay

## 2023-03-03 DIAGNOSIS — Z510 Encounter for antineoplastic radiation therapy: Secondary | ICD-10-CM | POA: Diagnosis not present

## 2023-03-03 LAB — RAD ONC ARIA SESSION SUMMARY
Course Elapsed Days: 23
Plan Fractions Treated to Date: 18
Plan Prescribed Dose Per Fraction: 1.8 Gy
Plan Total Fractions Prescribed: 25
Plan Total Prescribed Dose: 45 Gy
Reference Point Dosage Given to Date: 32.4 Gy
Reference Point Session Dosage Given: 1.8 Gy
Session Number: 18

## 2023-03-03 NOTE — Telephone Encounter (Signed)
Scheduled appointments per referral. Patient is aware of the appointment time and date as well as the address. Patient was informed to arrive 10-15 minutes prior with updated insurance information. All questions were answered.

## 2023-03-03 NOTE — Progress Notes (Signed)
The patient is a 77 year old gentleman currently receiving chemoradiation for a stage IIIc adenocarcinoma of the distal rectum.  The patient has received 18 of the planned 28 fractions to the pelvis and over the weekend started having concerns with discomfort in the anorectal region.  He describes mucus per rectum, no bleeding, and loose stool as he believes this is from his systemic treatment, he is going to be starting Lomotil for this and has a prescription from his medical oncologist Dr. Smiley Houseman.  He states that he noticed what he thought to be a bump in the anal region over the weekend and this has gotten bigger, it is sore, and he has adjacent bumps nearby.  He was concerned that this was related to his radiation.  On examination he has a fissure at the 6 o'clock position of the anal verge.  There is no visible tumor, erythema, or hyperpigmentation in this area, he does have adjacent comedones of the buttock on the right.  No specifically punctate or carbuncle type changes are noted.  We discussed Dermoplast spray, over the weekend if he is having trouble he could consider Desitin to the site but not during the week while he is receiving radiation.  We also discussed switching his medicated wipes to witch hazel, and he is comfortable with this plan.  No other complaints are verbalized.  He will otherwise continue planned radiation.

## 2023-03-04 ENCOUNTER — Ambulatory Visit
Admission: RE | Admit: 2023-03-04 | Discharge: 2023-03-04 | Disposition: A | Discharge status: Home / Self Care | Payer: No Typology Code available for payment source | Source: Ambulatory Visit | Attending: Radiation Oncology | Admitting: Radiation Oncology

## 2023-03-04 ENCOUNTER — Other Ambulatory Visit: Payer: Self-pay

## 2023-03-04 DIAGNOSIS — Z510 Encounter for antineoplastic radiation therapy: Secondary | ICD-10-CM | POA: Diagnosis not present

## 2023-03-04 LAB — RAD ONC ARIA SESSION SUMMARY
Course Elapsed Days: 24
Plan Fractions Treated to Date: 19
Plan Prescribed Dose Per Fraction: 1.8 Gy
Plan Total Fractions Prescribed: 25
Plan Total Prescribed Dose: 45 Gy
Reference Point Dosage Given to Date: 34.2 Gy
Reference Point Session Dosage Given: 1.8 Gy
Session Number: 19

## 2023-03-05 ENCOUNTER — Ambulatory Visit
Admission: RE | Admit: 2023-03-05 | Discharge: 2023-03-05 | Disposition: A | Discharge status: Home / Self Care | Payer: No Typology Code available for payment source | Source: Ambulatory Visit | Attending: Radiation Oncology | Admitting: Radiation Oncology

## 2023-03-05 ENCOUNTER — Other Ambulatory Visit: Payer: Self-pay

## 2023-03-05 ENCOUNTER — Inpatient Hospital Stay: Payer: No Typology Code available for payment source

## 2023-03-05 ENCOUNTER — Telehealth: Payer: Self-pay | Admitting: *Deleted

## 2023-03-05 ENCOUNTER — Inpatient Hospital Stay: Payer: No Typology Code available for payment source | Attending: Nurse Practitioner

## 2023-03-05 DIAGNOSIS — Z9221 Personal history of antineoplastic chemotherapy: Secondary | ICD-10-CM | POA: Diagnosis not present

## 2023-03-05 DIAGNOSIS — N400 Enlarged prostate without lower urinary tract symptoms: Secondary | ICD-10-CM | POA: Insufficient documentation

## 2023-03-05 DIAGNOSIS — R309 Painful micturition, unspecified: Secondary | ICD-10-CM | POA: Insufficient documentation

## 2023-03-05 DIAGNOSIS — E785 Hyperlipidemia, unspecified: Secondary | ICD-10-CM | POA: Diagnosis not present

## 2023-03-05 DIAGNOSIS — Z510 Encounter for antineoplastic radiation therapy: Secondary | ICD-10-CM | POA: Diagnosis present

## 2023-03-05 DIAGNOSIS — Z923 Personal history of irradiation: Secondary | ICD-10-CM | POA: Insufficient documentation

## 2023-03-05 DIAGNOSIS — I2510 Atherosclerotic heart disease of native coronary artery without angina pectoris: Secondary | ICD-10-CM | POA: Insufficient documentation

## 2023-03-05 DIAGNOSIS — C20 Malignant neoplasm of rectum: Secondary | ICD-10-CM | POA: Diagnosis present

## 2023-03-05 DIAGNOSIS — I10 Essential (primary) hypertension: Secondary | ICD-10-CM | POA: Insufficient documentation

## 2023-03-05 DIAGNOSIS — F4310 Post-traumatic stress disorder, unspecified: Secondary | ICD-10-CM | POA: Diagnosis not present

## 2023-03-05 DIAGNOSIS — Z95828 Presence of other vascular implants and grafts: Secondary | ICD-10-CM

## 2023-03-05 LAB — CBC WITH DIFFERENTIAL (CANCER CENTER ONLY)
Abs Immature Granulocytes: 0 10*3/uL (ref 0.00–0.07)
Basophils Absolute: 0 10*3/uL (ref 0.0–0.1)
Basophils Relative: 1 %
Eosinophils Absolute: 0.2 10*3/uL (ref 0.0–0.5)
Eosinophils Relative: 6 %
HCT: 32.8 % — ABNORMAL LOW (ref 39.0–52.0)
Hemoglobin: 11.2 g/dL — ABNORMAL LOW (ref 13.0–17.0)
Immature Granulocytes: 0 %
Lymphocytes Relative: 17 %
Lymphs Abs: 0.6 10*3/uL — ABNORMAL LOW (ref 0.7–4.0)
MCH: 32.4 pg (ref 26.0–34.0)
MCHC: 34.1 g/dL (ref 30.0–36.0)
MCV: 94.8 fL (ref 80.0–100.0)
Monocytes Absolute: 0.6 10*3/uL (ref 0.1–1.0)
Monocytes Relative: 19 %
Neutro Abs: 1.9 10*3/uL (ref 1.7–7.7)
Neutrophils Relative %: 57 %
Platelet Count: 117 10*3/uL — ABNORMAL LOW (ref 150–400)
RBC: 3.46 MIL/uL — ABNORMAL LOW (ref 4.22–5.81)
RDW: 14.2 % (ref 11.5–15.5)
WBC Count: 3.4 10*3/uL — ABNORMAL LOW (ref 4.0–10.5)
nRBC: 0 % (ref 0.0–0.2)

## 2023-03-05 LAB — RAD ONC ARIA SESSION SUMMARY
Course Elapsed Days: 25
Plan Fractions Treated to Date: 20
Plan Prescribed Dose Per Fraction: 1.8 Gy
Plan Total Fractions Prescribed: 25
Plan Total Prescribed Dose: 45 Gy
Reference Point Dosage Given to Date: 36 Gy
Reference Point Session Dosage Given: 1.8 Gy
Session Number: 20

## 2023-03-05 MED ORDER — HEPARIN SOD (PORK) LOCK FLUSH 100 UNIT/ML IV SOLN
500.0000 [IU] | Freq: Once | INTRAVENOUS | Status: AC
Start: 2023-03-05 — End: 2023-03-05
  Administered 2023-03-05: 500 [IU] via INTRAVENOUS

## 2023-03-05 MED ORDER — SODIUM CHLORIDE FLUSH 0.9 % IV SOLN
10.0000 mL | Freq: Once | INTRAVENOUS | Status: DC
  Filled 2023-03-05: qty 10

## 2023-03-05 NOTE — Patient Instructions (Signed)

## 2023-03-05 NOTE — Telephone Encounter (Signed)
-----   Message from Thornton Papas sent at 03/05/2023  9:32 AM EDT ----- Please call patient, CBC is stable, continue current treatment, follow-up as scheduled

## 2023-03-05 NOTE — Telephone Encounter (Signed)
LVM that labs are stable. Continue current treatment and f/u as scheduled

## 2023-03-08 ENCOUNTER — Other Ambulatory Visit: Payer: Self-pay

## 2023-03-08 ENCOUNTER — Ambulatory Visit
Admission: RE | Admit: 2023-03-08 | Discharge: 2023-03-08 | Disposition: A | Discharge status: Home / Self Care | Payer: No Typology Code available for payment source | Source: Ambulatory Visit | Attending: Radiation Oncology

## 2023-03-08 DIAGNOSIS — Z510 Encounter for antineoplastic radiation therapy: Secondary | ICD-10-CM | POA: Diagnosis not present

## 2023-03-08 LAB — RAD ONC ARIA SESSION SUMMARY
Course Elapsed Days: 28
Plan Fractions Treated to Date: 21
Plan Prescribed Dose Per Fraction: 1.8 Gy
Plan Total Fractions Prescribed: 25
Plan Total Prescribed Dose: 45 Gy
Reference Point Dosage Given to Date: 37.8 Gy
Reference Point Session Dosage Given: 1.8 Gy
Session Number: 21

## 2023-03-09 ENCOUNTER — Other Ambulatory Visit: Payer: Self-pay

## 2023-03-09 ENCOUNTER — Ambulatory Visit
Admission: RE | Admit: 2023-03-09 | Discharge: 2023-03-09 | Discharge status: Home / Self Care | Payer: No Typology Code available for payment source | Source: Ambulatory Visit | Attending: Radiation Oncology

## 2023-03-09 DIAGNOSIS — Z510 Encounter for antineoplastic radiation therapy: Secondary | ICD-10-CM | POA: Diagnosis not present

## 2023-03-09 LAB — RAD ONC ARIA SESSION SUMMARY
Course Elapsed Days: 29
Plan Fractions Treated to Date: 22
Plan Prescribed Dose Per Fraction: 1.8 Gy
Plan Total Fractions Prescribed: 25
Plan Total Prescribed Dose: 45 Gy
Reference Point Dosage Given to Date: 39.6 Gy
Reference Point Session Dosage Given: 1.8 Gy
Session Number: 22

## 2023-03-10 ENCOUNTER — Ambulatory Visit
Admission: RE | Admit: 2023-03-10 | Discharge: 2023-03-10 | Disposition: A | Discharge status: Home / Self Care | Payer: No Typology Code available for payment source | Source: Ambulatory Visit | Attending: Radiation Oncology | Admitting: Radiation Oncology

## 2023-03-10 ENCOUNTER — Other Ambulatory Visit: Payer: Self-pay

## 2023-03-10 DIAGNOSIS — Z510 Encounter for antineoplastic radiation therapy: Secondary | ICD-10-CM | POA: Diagnosis not present

## 2023-03-10 LAB — RAD ONC ARIA SESSION SUMMARY
Course Elapsed Days: 30
Plan Fractions Treated to Date: 23
Plan Prescribed Dose Per Fraction: 1.8 Gy
Plan Total Fractions Prescribed: 25
Plan Total Prescribed Dose: 45 Gy
Reference Point Dosage Given to Date: 41.4 Gy
Reference Point Session Dosage Given: 1.8 Gy
Session Number: 23

## 2023-03-11 ENCOUNTER — Ambulatory Visit
Admission: RE | Admit: 2023-03-11 | Discharge: 2023-03-11 | Disposition: A | Discharge status: Home / Self Care | Payer: No Typology Code available for payment source | Source: Ambulatory Visit | Attending: Radiation Oncology | Admitting: Radiation Oncology

## 2023-03-11 ENCOUNTER — Other Ambulatory Visit: Payer: Self-pay

## 2023-03-11 DIAGNOSIS — Z510 Encounter for antineoplastic radiation therapy: Secondary | ICD-10-CM | POA: Diagnosis not present

## 2023-03-11 LAB — RAD ONC ARIA SESSION SUMMARY
Course Elapsed Days: 31
Plan Fractions Treated to Date: 24
Plan Prescribed Dose Per Fraction: 1.8 Gy
Plan Total Fractions Prescribed: 25
Plan Total Prescribed Dose: 45 Gy
Reference Point Dosage Given to Date: 43.2 Gy
Reference Point Session Dosage Given: 1.8 Gy
Session Number: 24

## 2023-03-12 ENCOUNTER — Ambulatory Visit
Admission: RE | Admit: 2023-03-12 | Discharge: 2023-03-12 | Disposition: A | Discharge status: Home / Self Care | Payer: No Typology Code available for payment source | Source: Ambulatory Visit | Attending: Radiation Oncology | Admitting: Radiation Oncology

## 2023-03-12 ENCOUNTER — Other Ambulatory Visit: Payer: Self-pay

## 2023-03-12 DIAGNOSIS — Z510 Encounter for antineoplastic radiation therapy: Secondary | ICD-10-CM | POA: Diagnosis not present

## 2023-03-12 LAB — RAD ONC ARIA SESSION SUMMARY
Course Elapsed Days: 32
Plan Fractions Treated to Date: 25
Plan Prescribed Dose Per Fraction: 1.8 Gy
Plan Total Fractions Prescribed: 25
Plan Total Prescribed Dose: 45 Gy
Reference Point Dosage Given to Date: 45 Gy
Reference Point Session Dosage Given: 1.8 Gy
Session Number: 25

## 2023-03-15 ENCOUNTER — Ambulatory Visit
Admission: RE | Admit: 2023-03-15 | Discharge: 2023-03-15 | Disposition: A | Discharge status: Home / Self Care | Payer: No Typology Code available for payment source | Source: Ambulatory Visit | Attending: Radiation Oncology | Admitting: Radiation Oncology

## 2023-03-15 ENCOUNTER — Other Ambulatory Visit: Payer: Self-pay

## 2023-03-15 DIAGNOSIS — Z510 Encounter for antineoplastic radiation therapy: Secondary | ICD-10-CM | POA: Diagnosis not present

## 2023-03-15 LAB — RAD ONC ARIA SESSION SUMMARY
Course Elapsed Days: 35
Plan Fractions Treated to Date: 1
Plan Prescribed Dose Per Fraction: 1.8 Gy
Plan Total Fractions Prescribed: 3
Plan Total Prescribed Dose: 5.4 Gy
Reference Point Dosage Given to Date: 1.8 Gy
Reference Point Session Dosage Given: 1.8 Gy
Session Number: 26

## 2023-03-16 ENCOUNTER — Other Ambulatory Visit: Payer: Self-pay

## 2023-03-16 ENCOUNTER — Ambulatory Visit
Admission: RE | Admit: 2023-03-16 | Discharge: 2023-03-16 | Disposition: A | Discharge status: Home / Self Care | Payer: No Typology Code available for payment source | Source: Ambulatory Visit | Attending: Radiation Oncology

## 2023-03-16 DIAGNOSIS — Z510 Encounter for antineoplastic radiation therapy: Secondary | ICD-10-CM | POA: Diagnosis not present

## 2023-03-16 LAB — RAD ONC ARIA SESSION SUMMARY
Course Elapsed Days: 36
Plan Fractions Treated to Date: 2
Plan Prescribed Dose Per Fraction: 1.8 Gy
Plan Total Fractions Prescribed: 3
Plan Total Prescribed Dose: 5.4 Gy
Reference Point Dosage Given to Date: 3.6 Gy
Reference Point Session Dosage Given: 1.8 Gy
Session Number: 27

## 2023-03-17 ENCOUNTER — Ambulatory Visit
Admission: RE | Admit: 2023-03-17 | Discharge: 2023-03-17 | Disposition: A | Discharge status: Home / Self Care | Payer: No Typology Code available for payment source | Source: Ambulatory Visit | Attending: Radiation Oncology

## 2023-03-17 ENCOUNTER — Ambulatory Visit: Payer: No Typology Code available for payment source

## 2023-03-17 ENCOUNTER — Other Ambulatory Visit: Payer: Self-pay

## 2023-03-17 DIAGNOSIS — Z510 Encounter for antineoplastic radiation therapy: Secondary | ICD-10-CM | POA: Diagnosis not present

## 2023-03-17 LAB — RAD ONC ARIA SESSION SUMMARY
Course Elapsed Days: 37
Plan Fractions Treated to Date: 3
Plan Prescribed Dose Per Fraction: 1.8 Gy
Plan Total Fractions Prescribed: 3
Plan Total Prescribed Dose: 5.4 Gy
Reference Point Dosage Given to Date: 5.4 Gy
Reference Point Session Dosage Given: 1.8 Gy
Session Number: 28

## 2023-03-18 NOTE — Radiation Completion Notes (Addendum)
  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   Intent: Curative     ==========DELIVERED PLANS==========  First Treatment Date: 2023-02-08 - Last Treatment Date: 2023-03-17   Plan Name: Rectum Site: Rectum Technique: 3D Mode: Photon Dose Per Fraction: 1.8 Gy Prescribed Dose (Delivered / Prescribed): 45 Gy / 45 Gy Prescribed Fxs (Delivered / Prescribed): 25 / 25   Plan Name: Rectum_Bst Site: Rectum Technique: 3D Mode: Photon Dose Per Fraction: 1.8 Gy Prescribed Dose (Delivered / Prescribed): 5.4 Gy / 5.4 Gy Prescribed Fxs (Delivered / Prescribed): 3 / 3     ==========ON TREATMENT VISIT DATES========== 2023-02-12, 2023-02-19, 2023-02-26, 2023-03-05, 2023-03-10, 2023-03-12, 2023-03-17   See weekly On Treatment Notes in Epic for details. The patient tolerated radiation. He developed fatigue and anal fissuring which was felt to be due to bowel urgency. He was using OTC suppositories at the conclusion of treatment.  The patient will receive a call in about one month from the radiation oncology department. He will continue follow up with Dr. Truett Perna as well as Dr. Cliffton Asters.      [name], PAC

## 2023-03-23 ENCOUNTER — Other Ambulatory Visit: Payer: No Typology Code available for payment source

## 2023-03-23 ENCOUNTER — Encounter: Payer: Self-pay | Admitting: *Deleted

## 2023-03-23 NOTE — Progress Notes (Signed)
PATIENT NAVIGATOR PROGRESS NOTE  Name: Ronald [name] Date: 03/23/2023 MRN: [id number]  DOB: [birth year]   Reason for visit:  Incoming call after finishing capecitabine/radiation on 11/13  Comments:  [name] called with a few concerns after completing concurrent capecitabine/radiation He is experiencing left hip pain with walking, resolves when sitting.  He would like this addressed at next appt.  He is experiencing at times burning with urination and difficulty starting urine stream as well as having small bms when urinating. He does state that this is improving after completing radiation. He is taking Docusate Sodium 2 caps in am and pm to keep stool soft. We discussed that this can be side effects associated with radiation and that he can discuss at next visit on 11/22 with Dr Truett Perna He asked when he will get a F/U scan since he has completed radiation and informed him that Dr Truett Perna will discuss at next visit but it is usually several weeks after completing radiation.   He verbalized understanding and appreciated information and reassurance    Time spent counseling/coordinating care: 45-60 minutes

## 2023-03-25 ENCOUNTER — Encounter: Payer: Self-pay | Admitting: Oncology

## 2023-03-25 NOTE — Telephone Encounter (Signed)
Telephone call  

## 2023-03-26 ENCOUNTER — Inpatient Hospital Stay: Payer: No Typology Code available for payment source

## 2023-03-26 ENCOUNTER — Inpatient Hospital Stay (HOSPITAL_BASED_OUTPATIENT_CLINIC_OR_DEPARTMENT_OTHER): Payer: No Typology Code available for payment source | Admitting: Oncology

## 2023-03-26 VITALS — BP 151/71 | HR 74 | Temp 98.1°F | Resp 18 | Ht 72.0 in | Wt 162.5 lb

## 2023-03-26 DIAGNOSIS — Z95828 Presence of other vascular implants and grafts: Secondary | ICD-10-CM

## 2023-03-26 DIAGNOSIS — C20 Malignant neoplasm of rectum: Secondary | ICD-10-CM

## 2023-03-26 LAB — CMP (CANCER CENTER ONLY)
ALT: 15 U/L (ref 0–44)
AST: 18 U/L (ref 15–41)
Albumin: 4.3 g/dL (ref 3.5–5.0)
Alkaline Phosphatase: 70 U/L (ref 38–126)
Anion gap: 6 (ref 5–15)
BUN: 15 mg/dL (ref 8–23)
CO2: 25 mmol/L (ref 22–32)
Calcium: 8.7 mg/dL — ABNORMAL LOW (ref 8.9–10.3)
Chloride: 104 mmol/L (ref 98–111)
Creatinine: 0.82 mg/dL (ref 0.61–1.24)
GFR, Estimated: >60 mL/min (ref >60)
Glucose, Bld: 111 mg/dL — ABNORMAL HIGH (ref 70–99)
Potassium: 3.8 mmol/L (ref 3.5–5.1)
Sodium: 135 mmol/L (ref 135–145)
Total Bilirubin: 0.5 mg/dL (ref <1.2)
Total Protein: 6.8 g/dL (ref 6.5–8.1)

## 2023-03-26 LAB — CBC WITH DIFFERENTIAL (CANCER CENTER ONLY)
Abs Immature Granulocytes: 0.01 10*3/uL (ref 0.00–0.07)
Basophils Absolute: 0 10*3/uL (ref 0.0–0.1)
Basophils Relative: 1 %
Eosinophils Absolute: 0.1 10*3/uL (ref 0.0–0.5)
Eosinophils Relative: 2 %
HCT: 34.5 % — ABNORMAL LOW (ref 39.0–52.0)
Hemoglobin: 11.6 g/dL — ABNORMAL LOW (ref 13.0–17.0)
Immature Granulocytes: 0 %
Lymphocytes Relative: 13 %
Lymphs Abs: 0.6 10*3/uL — ABNORMAL LOW (ref 0.7–4.0)
MCH: 33.2 pg (ref 26.0–34.0)
MCHC: 33.6 g/dL (ref 30.0–36.0)
MCV: 98.9 fL (ref 80.0–100.0)
Monocytes Absolute: 0.7 10*3/uL (ref 0.1–1.0)
Monocytes Relative: 16 %
Neutro Abs: 3.1 10*3/uL (ref 1.7–7.7)
Neutrophils Relative %: 68 %
Platelet Count: 128 10*3/uL — ABNORMAL LOW (ref 150–400)
RBC: 3.49 MIL/uL — ABNORMAL LOW (ref 4.22–5.81)
RDW: 16.9 % — ABNORMAL HIGH (ref 11.5–15.5)
WBC Count: 4.4 10*3/uL (ref 4.0–10.5)
nRBC: 0 % (ref 0.0–0.2)

## 2023-03-26 LAB — MAGNESIUM: Magnesium: 1.8 mg/dL (ref 1.7–2.4)

## 2023-03-26 MED ORDER — HEPARIN SOD (PORK) LOCK FLUSH 100 UNIT/ML IV SOLN
500.0000 [IU] | Freq: Once | INTRAVENOUS | Status: AC
  Administered 2023-03-26: 500 [IU] via INTRAVENOUS

## 2023-03-26 MED ORDER — SODIUM CHLORIDE 0.9% FLUSH
10.0000 mL | Freq: Once | INTRAVENOUS | Status: AC
  Administered 2023-03-26: 10 mL via INTRAVENOUS

## 2023-03-26 NOTE — Progress Notes (Signed)
[facility] OFFICE PROGRESS NOTE   Diagnosis: Rectal cancer  INTERVAL HISTORY:   Ronald [name] completed radiation on 03/17/2023.  He reports intermittent burning with urination.  This improved with Azo.  No hand or foot pain.  He reports intermittent discomfort at the left "hip ".  Objective:  Vital signs in last 24 hours:  Blood pressure (!) 151/71, pulse 74, temperature 98.1 F (36.7 C), temperature source Temporal, resp. rate 18, height 6' (1.829 m), weight 162 lb 8 oz (73.7 kg), SpO2 100%.    HEENT: No thrush or ulcers Lymphatics: No cervical, supraclavicular, axillary, or inguinal nodes Resp: Lungs clear bilaterally Cardio: Regular rate and rhythm GI: No mass, no hepatosplenomegaly Vascular: No leg edema Musculoskeletal: No pain with motion at the left hip, mild tenderness at the left trochanter Skin: Palms without erythema  Portacath/PICC-without erythema  Lab Results:  Lab Results  Component Value Date   WBC 4.4 03/26/2023   HGB 11.6 (L) 03/26/2023   HCT 34.5 (L) 03/26/2023   MCV 98.9 03/26/2023   PLT 128 (L) 03/26/2023   NEUTROABS 3.1 03/26/2023    CMP  Lab Results  Component Value Date   NA 137 02/26/2023   K 4.1 02/26/2023   CL 104 02/26/2023   CO2 29 02/26/2023   GLUCOSE 107 (H) 02/26/2023   BUN 12 02/26/2023   CREATININE 0.94 02/26/2023   CALCIUM 9.4 02/26/2023   PROT 6.9 02/26/2023   ALBUMIN 4.4 02/26/2023   AST 19 02/26/2023   ALT 14 02/26/2023   ALKPHOS 63 02/26/2023   BILITOT 0.6 02/26/2023   GFRNONAA >60 02/26/2023   GFRAA 84 02/10/2019    Lab Results  Component Value Date   CEA1 23.9 (H) 07/24/2022   CEA 3.97 12/23/2022      Medications: I have reviewed the patient's current medications.   Assessment/Plan: Rectal cancer Colonoscopy 07/24/2022-mass in the posterior rectum extending from the dentate line for 3 cm, biopsy-moderately differentiated invasive adenocarcinoma, mismatch repair protein expression  intact CT abdomen/pelvis 04/07/2022-cardiac enlargement, hepatic steatosis mild left adrenal nodularity stable compared to chest CT March 2020, small nonobstructing bilateral renal calculi, extensive colonic diverticula CT chest 06/03/2022-6 mm nodule in the superior right lower lobe-new from 07/12/2018, new 1-2 mm nodule within the left upper lobe Elevated CEA 07/24/2022 MRI pelvis 08/06/2022-tumor at 3.5 cm from the anal verge with involvement of the upper anal sphincter, invasion of the inferior right levator ani muscle, T4b, one 5 mm left posterior perirectal node-in 1 CTs 08/19/2022-partially circumferential mass of the low rectum.  Small bilateral pulmonary nodules slightly enlarged compared to prior examination.  Plan for follow-up CT chest after completing 6 cycles of chemotherapy. Cycle 1 FOLFOX 08/24/2022 Cycle 2 FOLFOX 09/07/2022 Chemotherapy held 09/21/2022 due to severe neutropenia, thrombocytopenia Cycle 3 FOLFOX 10/05/2022, Udenyca, oxaliplatin dose reduced secondary to thrombocytopenia following cycle 2 Treatment held 10/20/2022 due to thrombocytopenia Cycle 4 FOLFOX 10/27/2022, Udenyca, oxaliplatin dose reduced secondary to thrombocytopenia following cycle 3 Cycle 5 FOLFOX 11/09/2022, Udenyca Cycle 6 FOLFOX 11/24/2022, Udenyca Cycle 7 FOLFOX 12/08/2022, Udenyca CTs 12/16/2022-similar appearance of circumferential low rectal wall mass; decreased size of scattered small bilateral pulmonary nodules.  No new suspicious pulmonary nodules or masses.  No evidence of metastatic disease in the abdomen or pelvis. Cycle 8 FOLFOX 01/05/2023, 5-FU bolus eliminated, pump dose decreased due to diarrhea Radiation/Xeloda 02/08/2023-03/17/2023   CAD, status post myocardial infarction 2015 and July 2023, D1 branch LAD angioplasty 12/23/2021 Hyperlipidemia Hypertension PTSD BPH Cigar use  Disposition: [name] has a history of rectal cancer.  He has completed a course of total neoadjuvant therapy.  He will  be referred for restaging CTs, a pelvic MRI, and sigmoidoscopy.  I will present his case at the GI tumor conference after the restaging evaluation.  We will decide on the indication for resection of the rectal primary.  The "hip "discomfort is likely related to a benign musculoskeletal condition.  We will investigate this further if the pain persists.  [name] will return for an office visit 04/26/2023.  Thornton Papas, MD  03/26/2023  9:36 AM

## 2023-04-06 ENCOUNTER — Encounter: Payer: Self-pay | Admitting: *Deleted

## 2023-04-06 NOTE — Progress Notes (Signed)
Received call from [facility] GI office and pt is scheduled for Sigmoidoscopy with Dr Elnoria Howard on 05/14/23

## 2023-04-16 ENCOUNTER — Ambulatory Visit (HOSPITAL_COMMUNITY)
Admission: RE | Admit: 2023-04-16 | Discharge: 2023-04-16 | Disposition: A | Discharge status: Home / Self Care | Payer: No Typology Code available for payment source | Source: Ambulatory Visit | Attending: Oncology | Admitting: Oncology

## 2023-04-16 DIAGNOSIS — C20 Malignant neoplasm of rectum: Secondary | ICD-10-CM | POA: Insufficient documentation

## 2023-04-17 ENCOUNTER — Ambulatory Visit (HOSPITAL_BASED_OUTPATIENT_CLINIC_OR_DEPARTMENT_OTHER)
Admission: RE | Admit: 2023-04-17 | Discharge: 2023-04-17 | Discharge status: Home / Self Care | Payer: No Typology Code available for payment source | Source: Ambulatory Visit | Attending: Oncology | Admitting: Oncology

## 2023-04-17 ENCOUNTER — Ambulatory Visit (HOSPITAL_BASED_OUTPATIENT_CLINIC_OR_DEPARTMENT_OTHER)
Admission: RE | Admit: 2023-04-17 | Discharge: 2023-04-17 | Disposition: A | Discharge status: Home / Self Care | Payer: No Typology Code available for payment source | Source: Ambulatory Visit | Attending: Oncology | Admitting: Oncology

## 2023-04-17 DIAGNOSIS — C20 Malignant neoplasm of rectum: Secondary | ICD-10-CM

## 2023-04-17 MED ORDER — IOHEXOL 300 MG/ML  SOLN
100.0000 mL | Freq: Once | INTRAMUSCULAR | Status: AC | PRN
  Administered 2023-04-17: 80 mL via INTRAVENOUS

## 2023-04-19 ENCOUNTER — Ambulatory Visit
Admission: RE | Admit: 2023-04-19 | Discharge: 2023-04-19 | Disposition: A | Discharge status: Home / Self Care | Payer: No Typology Code available for payment source | Source: Ambulatory Visit | Attending: Radiation Oncology | Admitting: Radiation Oncology

## 2023-04-19 DIAGNOSIS — C20 Malignant neoplasm of rectum: Secondary | ICD-10-CM | POA: Insufficient documentation

## 2023-04-19 DIAGNOSIS — Z510 Encounter for antineoplastic radiation therapy: Secondary | ICD-10-CM | POA: Insufficient documentation

## 2023-04-19 NOTE — Progress Notes (Signed)
  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   Symptoms of fatigue have improved since completing therapy.  Symptoms of skin changes have improved since completing therapy.   Symptoms of bladder changes have improved since completing therapy.  Symptoms of bowel changes have improved since completing therapy.  The patient is scheduled for follow up or surgery with colorectal surgeon Dr. Cliffton Asters.  The patient has scheduled follow up with his medical oncologist Dr. Truett Perna and their surgeon Dr. Cliffton Asters for ongoing surveillance. He was encouraged to call if he develops concerns or questions regarding radiation.   This concludes the interaction.  [name], LPN

## 2023-04-22 ENCOUNTER — Telehealth: Payer: Self-pay | Admitting: *Deleted

## 2023-04-22 ENCOUNTER — Other Ambulatory Visit: Payer: Self-pay | Admitting: *Deleted

## 2023-04-22 ENCOUNTER — Telehealth: Payer: Self-pay

## 2023-04-22 NOTE — Telephone Encounter (Addendum)
A patient called and left a message indicating that their hip condition is deteriorating and that they require assistance from the provider. I attempted to return the call, but was unable to reach the patient.

## 2023-04-22 NOTE — Telephone Encounter (Signed)
Ronald [name] reports still having significant pain in his left hip and is asking for something to be done. Called back and got VM. Left message asking if pain is worse than at last visit here? Describe the pain and rate on 1-10. What has he been trying at home for the pain?

## 2023-04-22 NOTE — Progress Notes (Signed)
The proposed treatment discussed in conference is for discussion purpose only and is not a binding recommendation.  The patients have not been physically examined, or presented with their treatment options.  Therefore, final treatment plans cannot be decided.  

## 2023-04-26 ENCOUNTER — Encounter: Payer: Self-pay | Admitting: Oncology

## 2023-04-26 ENCOUNTER — Inpatient Hospital Stay: Payer: No Typology Code available for payment source | Attending: Nurse Practitioner | Admitting: Oncology

## 2023-04-26 VITALS — BP 142/66 | HR 77 | Temp 98.2°F | Resp 18 | Wt 166.9 lb

## 2023-04-26 DIAGNOSIS — E785 Hyperlipidemia, unspecified: Secondary | ICD-10-CM | POA: Diagnosis not present

## 2023-04-26 DIAGNOSIS — I2510 Atherosclerotic heart disease of native coronary artery without angina pectoris: Secondary | ICD-10-CM | POA: Diagnosis not present

## 2023-04-26 DIAGNOSIS — M25552 Pain in left hip: Secondary | ICD-10-CM | POA: Insufficient documentation

## 2023-04-26 DIAGNOSIS — I252 Old myocardial infarction: Secondary | ICD-10-CM | POA: Insufficient documentation

## 2023-04-26 DIAGNOSIS — C20 Malignant neoplasm of rectum: Secondary | ICD-10-CM | POA: Diagnosis not present

## 2023-04-26 DIAGNOSIS — N400 Enlarged prostate without lower urinary tract symptoms: Secondary | ICD-10-CM | POA: Insufficient documentation

## 2023-04-26 DIAGNOSIS — R918 Other nonspecific abnormal finding of lung field: Secondary | ICD-10-CM | POA: Insufficient documentation

## 2023-04-26 DIAGNOSIS — F4310 Post-traumatic stress disorder, unspecified: Secondary | ICD-10-CM | POA: Insufficient documentation

## 2023-04-26 DIAGNOSIS — I10 Essential (primary) hypertension: Secondary | ICD-10-CM | POA: Diagnosis not present

## 2023-04-26 NOTE — Progress Notes (Signed)
[facility] OFFICE PROGRESS NOTE   Diagnosis: Rectal cancer  INTERVAL HISTORY:   Ronald [name] returns as scheduled.  He reports discomfort at the left upper buttock for the last 3 weeks.  He takes Tylenol as needed.  The pain sometimes radiates into the posterior left calf. No difficulty with bowel function.  No bleeding.  Objective:  Vital signs in last 24 hours:  Blood pressure (!) 142/66, pulse 77, temperature 98.2 F (36.8 C), temperature source Temporal, resp. rate 18, weight 166 lb 14.4 oz (75.7 kg), SpO2 100%.    Lymphatics: No cervical, supraclavicular, axillary, or inguinal nodes Resp: Lungs clear bilaterally Cardio: Regular rate and rhythm GI: No hepatosplenomegaly Vascular: No leg edema Musculoskeletal: The area of discomfort is at the left upper lateral buttock, no tenderness at the left trochanter.  No pain with motion at the left hip.  No sacral tenderness.  Portacath/PICC-without erythema  Lab Results:  Lab Results  Component Value Date   WBC 4.4 03/26/2023   HGB 11.6 (L) 03/26/2023   HCT 34.5 (L) 03/26/2023   MCV 98.9 03/26/2023   PLT 128 (L) 03/26/2023   NEUTROABS 3.1 03/26/2023    CMP  Lab Results  Component Value Date   NA 135 03/26/2023   K 3.8 03/26/2023   CL 104 03/26/2023   CO2 25 03/26/2023   GLUCOSE 111 (H) 03/26/2023   BUN 15 03/26/2023   CREATININE 0.82 03/26/2023   CALCIUM 8.7 (L) 03/26/2023   PROT 6.8 03/26/2023   ALBUMIN 4.3 03/26/2023   AST 18 03/26/2023   ALT 15 03/26/2023   ALKPHOS 70 03/26/2023   BILITOT 0.5 03/26/2023   GFRNONAA >60 03/26/2023   GFRAA 84 02/10/2019    Lab Results  Component Value Date   CEA1 23.9 (H) 07/24/2022   CEA 3.97 12/23/2022    Lab Results  Component Value Date   INR 0.93 01/14/2014   LABPROT 12.5 01/14/2014    Imaging:  No results found.  Medications: I have reviewed the patient's current medications.   Assessment/Plan: Rectal cancer Colonoscopy 07/24/2022-mass  in the posterior rectum extending from the dentate line for 3 cm, biopsy-moderately differentiated invasive adenocarcinoma, mismatch repair protein expression intact CT abdomen/pelvis 04/07/2022-cardiac enlargement, hepatic steatosis mild left adrenal nodularity stable compared to chest CT March 2020, small nonobstructing bilateral renal calculi, extensive colonic diverticula CT chest 06/03/2022-6 mm nodule in the superior right lower lobe-new from 07/12/2018, new 1-2 mm nodule within the left upper lobe Elevated CEA 07/24/2022 MRI pelvis 08/06/2022-tumor at 3.5 cm from the anal verge with involvement of the upper anal sphincter, invasion of the inferior right levator ani muscle, T4b, one 5 mm left posterior perirectal node-in 1 CTs 08/19/2022-partially circumferential mass of the low rectum.  Small bilateral pulmonary nodules slightly enlarged compared to prior examination.  Plan for follow-up CT chest after completing 6 cycles of chemotherapy. Cycle 1 FOLFOX 08/24/2022 Cycle 2 FOLFOX 09/07/2022 Chemotherapy held 09/21/2022 due to severe neutropenia, thrombocytopenia Cycle 3 FOLFOX 10/05/2022, Udenyca, oxaliplatin dose reduced secondary to thrombocytopenia following cycle 2 Treatment held 10/20/2022 due to thrombocytopenia Cycle 4 FOLFOX 10/27/2022, Udenyca, oxaliplatin dose reduced secondary to thrombocytopenia following cycle 3 Cycle 5 FOLFOX 11/09/2022, Udenyca Cycle 6 FOLFOX 11/24/2022, Udenyca Cycle 7 FOLFOX 12/08/2022, Udenyca CTs 12/16/2022-similar appearance of circumferential low rectal wall mass; decreased size of scattered small bilateral pulmonary nodules.  No new suspicious pulmonary nodules or masses.  No evidence of metastatic disease in the abdomen or pelvis. Cycle 8 FOLFOX 01/05/2023, 5-FU bolus  eliminated, pump dose decreased due to diarrhea Radiation/Xeloda 02/08/2023-03/17/2023 CT chest/abdomen 04/17/2023: Stable pulmonary nodules no evidence of metastatic disease MRI pelvis 04/16/2023: Tumor at 3.5  cm from anal verge, decreased in thickness, decreased extension through the muscularis propria with 1 area of extension measuring 3 mm-potentially retraction scar, abutment of the mesorectal fascia at the 8 o'clock position, previously noted perirectal lymph node is no longer measurable, no new lymphadenopathy   CAD, status post myocardial infarction 2015 and July 2023, D1 branch LAD angioplasty 12/23/2021 Hyperlipidemia Hypertension PTSD BPH Cigar use      Disposition: Ronald [name] has completed a course of total neoadjuvant therapy for rectal cancer.  His case was presented at the GI tumor conference 04/22/2023.  The lung nodules are stable with comparison to previous chest CT suggesting a benign etiology.  The rectal tumor is smaller.  There is no radiologic evidence of disease progression. Ronald [name] will undergo a restaging sigmoidoscopy with Dr. Elnoria Howard 05/13/2022.  He will see Dr. Cliffton Asters.  The plan will be to proceed with resection of the primary tumor versus observation depending on the sigmoidoscopy findings.  The etiology of the left "hip "discomfort is unclear.  I suspect the pain is related to a benign musculoskeletal condition at the lumbosacral spine.  He will continue Tylenol as needed for pain.  We will refer him for imaging of the lumbosacral spine if the discomfort persists.  Ronald [name] will return for an office visit during the week of 05/16/2022.  Thornton Papas, MD  04/26/2023  10:38 AM

## 2023-04-29 ENCOUNTER — Other Ambulatory Visit: Payer: Self-pay | Admitting: Gastroenterology

## 2023-05-06 NOTE — Progress Notes (Signed)
 Called pt. To review medical history. He was unable to talk and asked me to call his wife at work. I spoke with wife and she was busy. Clearnce Sorrel # was  given to wife for wife to call back as soon as she can tomorrow.

## 2023-05-06 NOTE — Progress Notes (Addendum)
 PCP - [facility] Cardiologist - Dr. Ladona RONCO 07-29-22 epic  PPM/ICD -  Device Orders -  Rep Notified -   Chest x-ray -  EKG -  Stress Test -  ECHO -  Cardiac Cath -   Sleep Study -  CPAP -   Fasting Blood Sugar -  Checks Blood Sugar _____ times a day  Blood Thinner Instructions: Aspirin  Instructions:  ERAS Protcol - PRE-SURGERY Ensure or G2-    COVID vaccine -  Activity-- Anesthesia review:   Patient denies shortness of breath, fever, cough and chest pain at PAT appointment   All instructions explained to the patient, with a verbal understanding of the material. Patient agrees to go over the instructions while at home for a better understanding. Patient also instructed to self quarantine after being tested for COVID-19. The opportunity to ask questions was provided.

## 2023-05-14 ENCOUNTER — Encounter (HOSPITAL_COMMUNITY): Payer: Self-pay | Admitting: Gastroenterology

## 2023-05-14 ENCOUNTER — Encounter (HOSPITAL_COMMUNITY)
Admission: RE | Disposition: A | Discharge status: Home / Self Care | Payer: Self-pay | Source: Home / Self Care | Attending: Gastroenterology

## 2023-05-14 ENCOUNTER — Ambulatory Visit (HOSPITAL_BASED_OUTPATIENT_CLINIC_OR_DEPARTMENT_OTHER): Payer: No Typology Code available for payment source | Admitting: Certified Registered Nurse Anesthetist

## 2023-05-14 ENCOUNTER — Other Ambulatory Visit: Payer: Self-pay

## 2023-05-14 ENCOUNTER — Ambulatory Visit (HOSPITAL_COMMUNITY): Payer: No Typology Code available for payment source | Admitting: Certified Registered Nurse Anesthetist

## 2023-05-14 ENCOUNTER — Ambulatory Visit (HOSPITAL_COMMUNITY)
Admission: RE | Admit: 2023-05-14 | Discharge: 2023-05-14 | Disposition: A | Discharge status: Home / Self Care | Payer: No Typology Code available for payment source | Attending: Gastroenterology | Admitting: Gastroenterology

## 2023-05-14 DIAGNOSIS — I10 Essential (primary) hypertension: Secondary | ICD-10-CM | POA: Insufficient documentation

## 2023-05-14 DIAGNOSIS — F17210 Nicotine dependence, cigarettes, uncomplicated: Secondary | ICD-10-CM

## 2023-05-14 DIAGNOSIS — K219 Gastro-esophageal reflux disease without esophagitis: Secondary | ICD-10-CM | POA: Diagnosis not present

## 2023-05-14 DIAGNOSIS — I25110 Atherosclerotic heart disease of native coronary artery with unstable angina pectoris: Secondary | ICD-10-CM

## 2023-05-14 DIAGNOSIS — I252 Old myocardial infarction: Secondary | ICD-10-CM | POA: Insufficient documentation

## 2023-05-14 DIAGNOSIS — Z85038 Personal history of other malignant neoplasm of large intestine: Secondary | ICD-10-CM

## 2023-05-14 DIAGNOSIS — Z1211 Encounter for screening for malignant neoplasm of colon: Secondary | ICD-10-CM | POA: Diagnosis not present

## 2023-05-14 DIAGNOSIS — Z08 Encounter for follow-up examination after completed treatment for malignant neoplasm: Secondary | ICD-10-CM | POA: Insufficient documentation

## 2023-05-14 DIAGNOSIS — Z7902 Long term (current) use of antithrombotics/antiplatelets: Secondary | ICD-10-CM | POA: Diagnosis not present

## 2023-05-14 DIAGNOSIS — F17200 Nicotine dependence, unspecified, uncomplicated: Secondary | ICD-10-CM | POA: Diagnosis not present

## 2023-05-14 DIAGNOSIS — K6289 Other specified diseases of anus and rectum: Secondary | ICD-10-CM | POA: Insufficient documentation

## 2023-05-14 HISTORY — PX: BIOPSY: SHX5522

## 2023-05-14 HISTORY — PX: FLEXIBLE SIGMOIDOSCOPY: SHX5431

## 2023-05-14 SURGERY — SIGMOIDOSCOPY, FLEXIBLE
Anesthesia: Monitor Anesthesia Care

## 2023-05-14 MED ORDER — SODIUM CHLORIDE 0.9 % IV SOLN: INTRAVENOUS | Status: DC | PRN

## 2023-05-14 MED ORDER — PROPOFOL 500 MG/50ML IV EMUL
INTRAVENOUS | Status: AC
  Filled 2023-05-14: qty 50

## 2023-05-14 MED ORDER — PROPOFOL 10 MG/ML IV BOLUS
INTRAVENOUS | Status: DC | PRN
  Administered 2023-05-14: 50 mg via INTRAVENOUS

## 2023-05-14 MED ORDER — PROPOFOL 500 MG/50ML IV EMUL
INTRAVENOUS | Status: DC | PRN
  Administered 2023-05-14: 100 ug/kg/min via INTRAVENOUS

## 2023-05-14 NOTE — Discharge Instructions (Signed)

## 2023-05-14 NOTE — Anesthesia Preprocedure Evaluation (Addendum)
 Anesthesia Evaluation  Patient identified by MRN, date of birth, ID band Patient awake    Reviewed: Allergy & Precautions, NPO status , Patient's Chart, lab work & pertinent test results, reviewed documented beta blocker date and time   Airway Mallampati: III  TM Distance: >3 FB Neck ROM: Full    Dental  (+) Teeth Intact, Dental Advisory Given, Poor Dentition   Pulmonary Current Smoker   Pulmonary exam normal breath sounds clear to auscultation       Cardiovascular hypertension, Pt. on medications and Pt. on home beta blockers (-) angina + CAD, + Past MI and + Cardiac Stents  Normal cardiovascular exam Rhythm:Regular Rate:Normal     Neuro/Psych negative neurological ROS  negative psych ROS   GI/Hepatic Neg liver ROS,GERD  Medicated,,rectal cancer   Endo/Other  negative endocrine ROS    Renal/GU negative Renal ROS     Musculoskeletal negative musculoskeletal ROS (+)    Abdominal   Peds  Hematology  (+) Blood dyscrasia (Plavix )   Anesthesia Other Findings Day of surgery medications reviewed with the patient.  Reproductive/Obstetrics                             Anesthesia Physical Anesthesia Plan  ASA: 3  Anesthesia Plan: MAC   Post-op Pain Management: Minimal or no pain anticipated   Induction: Intravenous  PONV Risk Score and Plan: 0 and TIVA  Airway Management Planned: Natural Airway and Simple Face Mask  Additional Equipment:   Intra-op Plan:   Post-operative Plan:   Informed Consent: I have reviewed the patients History and Physical, chart, labs and discussed the procedure including the risks, benefits and alternatives for the proposed anesthesia with the patient or authorized representative who has indicated his/her understanding and acceptance.     Dental advisory given  Plan Discussed with: CRNA  Anesthesia Plan Comments:        Anesthesia Quick  Evaluation

## 2023-05-14 NOTE — Anesthesia Procedure Notes (Signed)
 Procedure Name: MAC Date/Time: 05/14/2023 9:20 AM  Performed by: Joshua Vernell BROCKS, CRNAPre-anesthesia Checklist: Patient identified, Emergency Drugs available, Suction available and Patient being monitored Patient Re-evaluated:Patient Re-evaluated prior to induction Oxygen Delivery Method: Simple face mask Preoxygenation: Pre-oxygenation with 100% oxygen Placement Confirmation: positive ETCO2 and breath sounds checked- equal and bilateral

## 2023-05-14 NOTE — Transfer of Care (Addendum)
 Immediate Anesthesia Transfer of Care Note  Patient: Ronald [name]  Procedure(s) Performed: Procedure(s): FLEXIBLE SIGMOIDOSCOPY (N/A)  Patient Location: PACU  Anesthesia Type:MAC  Level of Consciousness: Patient easily awoken, comfortable, cooperative, following commands, responds to stimulation.   Airway & Oxygen Therapy: Patient spontaneously breathing, ventilating well, oxygen via simple oxygen mask.  Post-op Assessment: Report given to PACU RN, vital signs reviewed and stable, moving all extremities.   Post vital signs: Reviewed and stable.  Complications: No apparent anesthesia complications Last Vitals:  Vitals Value Taken Time  BP 85/46 05/14/23 0935  Temp    Pulse 62 05/14/23 0935  Resp 14 05/14/23 0935  SpO2 100 05/14/23 0935    Last Pain:  Vitals:   05/14/23 0811  TempSrc: Temporal  PainSc: 0-No pain         Complications: No notable events documented.

## 2023-05-14 NOTE — H&P (Signed)
 Debby NOVAK Ose HPI: The patient is here for follow up of his rectal cancer treatment.  Past Medical History:  Diagnosis Date   Bronchitis    Cancer [facility])    colon   History of kidney stones    Hyperlipidemia    Hypertension    Myocardial infarction [facility])     Past Surgical History:  Procedure Laterality Date   BIOPSY  07/24/2022   Procedure: BIOPSY;  Surgeon: Rollin Dover, MD;  Location: [facility];  Service: Gastroenterology;;   COLONOSCOPY WITH PROPOFOL  N/A 07/24/2022   Procedure: COLONOSCOPY WITH PROPOFOL ;  Surgeon: Rollin Dover, MD;  Location: [facility];  Service: Gastroenterology;  Laterality: N/A;   CORONARY STENT INTERVENTION N/A 12/23/2021   Procedure: CORONARY STENT INTERVENTION;  Surgeon: Ladona Heinz, MD;  Location: [facility];  Service: Cardiovascular;  Laterality: N/A;   LEFT HEART CATH AND CORONARY ANGIOGRAPHY N/A 11/19/2021   Procedure: LEFT HEART CATH AND CORONARY ANGIOGRAPHY;  Surgeon: Elmira Newman PARAS, MD;  Location: [facility] CV [facility];  Service: Cardiovascular;  Laterality: N/A;   LEFT HEART CATHETERIZATION WITH CORONARY ANGIOGRAM N/A 01/15/2014   Procedure: LEFT HEART CATHETERIZATION WITH CORONARY ANGIOGRAM;  Surgeon: Ozell JONETTA Fell, MD;  Location: [facility] CATH LAB;  Service: Cardiovascular;  Laterality: N/A;   PORTACATH PLACEMENT N/A 08/18/2022   Procedure: INSERTION PORT-A-CATH;  Surgeon: Teresa Lonni HERO, MD;  Location: [facility]S;  Service: General;  Laterality: N/A;  60    History reviewed. No pertinent family history.  Social History:  reports that he has been smoking cigars. He has never used smokeless tobacco. He reports current alcohol use. He reports that he does not use drugs.  Allergies:  Allergies  Allergen Reactions   Angiotensin Receptor Blockers Diarrhea   Beta Adrenergic Blockers Other (See Comments)    Fatigue   Zestril  [Lisinopril ] Rash    Medications: Scheduled: Continuous:  No results found for this or any previous visit  (from the past 24 hours).   No results found.  ROS:  As stated above in the HPI otherwise negative.  Blood pressure 124/65, pulse 74, temperature 98 F (36.7 C), temperature source Temporal, resp. rate 16, height 5' 8 (1.727 m), weight 74.8 kg, SpO2 100%.    PE: Gen: NAD, Alert and Oriented HEENT:  [place]/AT, EOMI Neck: Supple, no LAD Lungs: CTA Bilaterally CV: RRR without M/G/R ABD: Soft, NTND, +BS Ext: No C/C/E  Assessment/Plan: 1) Rectal cancer - FFS.  Edwing Figley D 05/14/2023, 9:06 AM

## 2023-05-14 NOTE — Anesthesia Postprocedure Evaluation (Signed)
 Anesthesia Post Note  Patient: Ronald [name]  Procedure(s) Performed: FLEXIBLE SIGMOIDOSCOPY     Patient location during evaluation: Endoscopy Anesthesia Type: MAC Level of consciousness: oriented, awake and alert and awake Pain management: pain level controlled Vital Signs Assessment: post-procedure vital signs reviewed and stable Respiratory status: spontaneous breathing, nonlabored ventilation and respiratory function stable Cardiovascular status: blood pressure returned to baseline and stable Postop Assessment: no headache, no backache and no apparent nausea or vomiting Anesthetic complications: no   No notable events documented.  Last Vitals:  Vitals:   05/14/23 0950 05/14/23 1000  BP: (!) 98/59 114/64  Pulse: 61 63  Resp: 16 (!) 23  Temp:    SpO2: 95% 100%    Last Pain:  Vitals:   05/14/23 1000  TempSrc:   PainSc: 0-No pain                 Garnette FORBES Skillern

## 2023-05-14 NOTE — Op Note (Signed)
 [facility] Patient Name: Ronald [name] Procedure Date: 05/14/2023 MRN: [id number] Attending MD: Belvie Just , MD, [id number] Date of Birth: [birth year] CSN: [id number] Age: 78 Admit Type: Inpatient Procedure:                Flexible Sigmoidoscopy Indications:              High risk colon cancer surveillance: Personal                            history of colon cancer Providers:                Belvie Just, MD, Willy Hummer, RN, Coye Canada, Technician Referring MD:             Belvie Just, MD Medicines:                Propofol  per Anesthesia Complications:            No immediate complications. Estimated Blood Loss:     Estimated blood loss: none. Procedure:                Pre-Anesthesia Assessment:                           - Prior to the procedure, a History and Physical                            was performed, and patient medications and                            allergies were reviewed. The patient's tolerance of                            previous anesthesia was also reviewed. The risks                            and benefits of the procedure and the sedation                            options and risks were discussed with the patient.                            All questions were answered, and informed consent                            was obtained. Prior Anticoagulants: The patient has                            taken no anticoagulant or antiplatelet agents. ASA                            Grade Assessment: III - A patient with severe  systemic disease. After reviewing the risks and                            benefits, the patient was deemed in satisfactory                            condition to undergo the procedure.                           - Sedation was administered by an anesthesia                            professional. Deep sedation was attained.                           After obtaining  informed consent, the scope was                            passed under direct vision. The [id number]-[id number] ([id number])                            Olympus endoscope was introduced through the anus                            and advanced to the the sigmoid colon. The flexible                            sigmoidoscopy was accomplished without difficulty.                            The patient tolerated the procedure well. The                            quality of the bowel preparation was good. Scope In: 9:23:10 AM Scope Out: 9:29:18 AM Total Procedure Duration: 0 hours 6 minutes 8 seconds  Findings:      A small scar was found in the rectum. The scar tissue was healthy in       appearance. Biopsies were taken with a cold forceps for histology.      The rectal examination was negative for any palpable masses. There was       scarring palpated in the posterior rectum and the rectum did feel       stenosed, subjectively. Endoscopically there was an excellent response       to treatment. There was no evidence of any residual cancer grossly. A       small post-treatment scar was noted at the dentate line in the posterior       anus. Multiple cold biopsies were obtained in this region. Impression:               - Scar in the rectum. Biopsied. NO evidence of                            residual cancer. Moderate Sedation:      Not Applicable - Patient had care per Anesthesia. Recommendation:           -  Patient has a contact number available for                            emergencies. The signs and symptoms of potential                            delayed complications were discussed with the                            patient. Return to normal activities tomorrow.                            Written discharge instructions were provided to the                            patient.                           - Resume regular diet.                           - Further management per Medical and Surgical                             Oncology.                           - Repeat colonoscopy after resection. Procedure Code(s):        --- Professional ---                           [phone], Sigmoidoscopy, flexible; with biopsy, single                            or multiple Diagnosis Code(s):        --- Professional ---                           S14.961, Personal history of other malignant                            neoplasm of large intestine                           K62.89, Other specified diseases of anus and rectum CPT copyright 2022 American Medical Association. All rights reserved. The codes documented in this report are preliminary and upon coder review may  be revised to meet current compliance requirements. Belvie Just, MD Belvie Just, MD 05/14/2023 9:41:56 AM This report has been signed electronically. Number of Addenda: 0

## 2023-05-16 ENCOUNTER — Encounter (HOSPITAL_COMMUNITY): Payer: Self-pay | Admitting: Gastroenterology

## 2023-05-17 ENCOUNTER — Telehealth: Payer: Self-pay

## 2023-05-17 LAB — SURGICAL PATHOLOGY

## 2023-05-17 NOTE — Telephone Encounter (Signed)
 The patient called to request an earlier appointment. They need to reschedule their current appointment due to a conflict with [facility] the [facility] day. I have forwarded this message to the scheduler for assistance in changing the appointment.

## 2023-05-21 ENCOUNTER — Ambulatory Visit: Payer: No Typology Code available for payment source | Admitting: Nurse Practitioner

## 2023-05-21 ENCOUNTER — Other Ambulatory Visit: Payer: No Typology Code available for payment source

## 2023-05-25 ENCOUNTER — Inpatient Hospital Stay: Payer: No Typology Code available for payment source | Attending: Nurse Practitioner

## 2023-05-25 ENCOUNTER — Inpatient Hospital Stay: Payer: No Typology Code available for payment source

## 2023-05-25 ENCOUNTER — Encounter: Payer: Self-pay | Admitting: Nurse Practitioner

## 2023-05-25 ENCOUNTER — Inpatient Hospital Stay (HOSPITAL_BASED_OUTPATIENT_CLINIC_OR_DEPARTMENT_OTHER): Payer: No Typology Code available for payment source | Admitting: Nurse Practitioner

## 2023-05-25 VITALS — BP 114/62 | HR 72 | Temp 97.8°F | Resp 18 | Ht 68.0 in | Wt 169.3 lb

## 2023-05-25 DIAGNOSIS — F17290 Nicotine dependence, other tobacco product, uncomplicated: Secondary | ICD-10-CM | POA: Insufficient documentation

## 2023-05-25 DIAGNOSIS — N400 Enlarged prostate without lower urinary tract symptoms: Secondary | ICD-10-CM | POA: Diagnosis not present

## 2023-05-25 DIAGNOSIS — I2510 Atherosclerotic heart disease of native coronary artery without angina pectoris: Secondary | ICD-10-CM | POA: Insufficient documentation

## 2023-05-25 DIAGNOSIS — C20 Malignant neoplasm of rectum: Secondary | ICD-10-CM | POA: Insufficient documentation

## 2023-05-25 DIAGNOSIS — E785 Hyperlipidemia, unspecified: Secondary | ICD-10-CM | POA: Insufficient documentation

## 2023-05-25 DIAGNOSIS — Z95828 Presence of other vascular implants and grafts: Secondary | ICD-10-CM

## 2023-05-25 DIAGNOSIS — I10 Essential (primary) hypertension: Secondary | ICD-10-CM | POA: Diagnosis not present

## 2023-05-25 DIAGNOSIS — F4310 Post-traumatic stress disorder, unspecified: Secondary | ICD-10-CM | POA: Diagnosis not present

## 2023-05-25 LAB — CMP (CANCER CENTER ONLY)
ALT: 18 U/L (ref 0–44)
AST: 18 U/L (ref 15–41)
Albumin: 4.2 g/dL (ref 3.5–5.0)
Alkaline Phosphatase: 65 U/L (ref 38–126)
Anion gap: 7 (ref 5–15)
BUN: 10 mg/dL (ref 8–23)
CO2: 26 mmol/L (ref 22–32)
Calcium: 8.8 mg/dL — ABNORMAL LOW (ref 8.9–10.3)
Chloride: 101 mmol/L (ref 98–111)
Creatinine: 0.83 mg/dL (ref 0.61–1.24)
GFR, Estimated: >60 mL/min (ref >60)
Glucose, Bld: 159 mg/dL — ABNORMAL HIGH (ref 70–99)
Potassium: 3.8 mmol/L (ref 3.5–5.1)
Sodium: 134 mmol/L — ABNORMAL LOW (ref 135–145)
Total Bilirubin: 0.6 mg/dL (ref 0.0–1.2)
Total Protein: 6.2 g/dL — ABNORMAL LOW (ref 6.5–8.1)

## 2023-05-25 LAB — CBC WITH DIFFERENTIAL (CANCER CENTER ONLY)
Abs Immature Granulocytes: 0.01 10*3/uL (ref 0.00–0.07)
Basophils Absolute: 0 10*3/uL (ref 0.0–0.1)
Basophils Relative: 1 %
Eosinophils Absolute: 0.1 10*3/uL (ref 0.0–0.5)
Eosinophils Relative: 3 %
HCT: 33.6 % — ABNORMAL LOW (ref 39.0–52.0)
Hemoglobin: 11.9 g/dL — ABNORMAL LOW (ref 13.0–17.0)
Immature Granulocytes: 0 %
Lymphocytes Relative: 23 %
Lymphs Abs: 1 10*3/uL (ref 0.7–4.0)
MCH: 33.8 pg (ref 26.0–34.0)
MCHC: 35.4 g/dL (ref 30.0–36.0)
MCV: 95.5 fL (ref 80.0–100.0)
Monocytes Absolute: 0.5 10*3/uL (ref 0.1–1.0)
Monocytes Relative: 11 %
Neutro Abs: 2.7 10*3/uL (ref 1.7–7.7)
Neutrophils Relative %: 62 %
Platelet Count: 147 10*3/uL — ABNORMAL LOW (ref 150–400)
RBC: 3.52 MIL/uL — ABNORMAL LOW (ref 4.22–5.81)
RDW: 13.4 % (ref 11.5–15.5)
WBC Count: 4.3 10*3/uL (ref 4.0–10.5)
nRBC: 0 % (ref 0.0–0.2)

## 2023-05-25 LAB — CEA (ACCESS): CEA (CHCC): 2.64 ng/mL (ref 0.00–5.00)

## 2023-05-25 MED ORDER — HEPARIN SOD (PORK) LOCK FLUSH 100 UNIT/ML IV SOLN
500.0000 [IU] | Freq: Once | INTRAVENOUS | Status: AC
  Administered 2023-05-25: 500 [IU] via INTRAVENOUS

## 2023-05-25 MED ORDER — SODIUM CHLORIDE 0.9% FLUSH
10.0000 mL | Freq: Once | INTRAVENOUS | Status: AC
  Administered 2023-05-25: 10 mL via INTRAVENOUS

## 2023-05-25 NOTE — Progress Notes (Unsigned)
[name] OFFICE PROGRESS NOTE   Diagnosis: Rectal cancer  INTERVAL HISTORY:   Ronald [name] returns for follow-up.  He feels well.  Bowels are being normally.  No blood or pain with bowel movements.  He reports a good appetite.  He is gaining weight.  He has persistent mild numbness in the fingertips.  He describes the numbness as "uncomfortable".  He intermittently notes difficulty picking up small items.  No numbness in the feet.  Objective:  Vital signs in last 24 hours:  Blood pressure 114/62, pulse 72, temperature 97.8 F (36.6 C), temperature source Oral, resp. rate 18, height 5\' 8"  (1.727 m), weight 169 lb 4.8 oz (76.8 kg), SpO2 99%.    HEENT: No thrush or ulcers. Lymphatics: No palpable cervical, supraclavicular, axillary or inguinal lymph nodes. Resp: Lungs clear bilaterally. Cardio: Regular rate and rhythm. GI: No hepatosplenomegaly. Vascular: No leg edema. Port-A-Cath without erythema.  Lab Results:  Lab Results  Component Value Date   WBC 4.3 05/25/2023   HGB 11.9 (L) 05/25/2023   HCT 33.6 (L) 05/25/2023   MCV 95.5 05/25/2023   PLT 147 (L) 05/25/2023   NEUTROABS 2.7 05/25/2023    Imaging:  No results found.  Medications: I have reviewed the patient's current medications.  Assessment/Plan: Rectal cancer Colonoscopy 07/24/2022-mass in the posterior rectum extending from the dentate line for 3 cm, biopsy-moderately differentiated invasive adenocarcinoma, mismatch repair protein expression intact CT abdomen/pelvis 04/07/2022-cardiac enlargement, hepatic steatosis mild left adrenal nodularity stable compared to chest CT March 2020, small nonobstructing bilateral renal calculi, extensive colonic diverticula CT chest 06/03/2022-6 mm nodule in the superior right lower lobe-new from 07/12/2018, new 1-2 mm nodule within the left upper lobe Elevated CEA 07/24/2022 MRI pelvis 08/06/2022-tumor at 3.5 cm from the anal verge with involvement of the upper anal  sphincter, invasion of the inferior right levator ani muscle, T4b, one 5 mm left posterior perirectal node-in 1 CTs 08/19/2022-partially circumferential mass of the low rectum.  Small bilateral pulmonary nodules slightly enlarged compared to prior examination.  Plan for follow-up CT chest after completing 6 cycles of chemotherapy. Cycle 1 FOLFOX 08/24/2022 Cycle 2 FOLFOX 09/07/2022 Chemotherapy held 09/21/2022 due to severe neutropenia, thrombocytopenia Cycle 3 FOLFOX 10/05/2022, Udenyca, oxaliplatin dose reduced secondary to thrombocytopenia following cycle 2 Treatment held 10/20/2022 due to thrombocytopenia Cycle 4 FOLFOX 10/27/2022, Udenyca, oxaliplatin dose reduced secondary to thrombocytopenia following cycle 3 Cycle 5 FOLFOX 11/09/2022, Udenyca Cycle 6 FOLFOX 11/24/2022, Udenyca Cycle 7 FOLFOX 12/08/2022, Udenyca CTs 12/16/2022-similar appearance of circumferential low rectal wall mass; decreased size of scattered small bilateral pulmonary nodules.  No new suspicious pulmonary nodules or masses.  No evidence of metastatic disease in the abdomen or pelvis. Cycle 8 FOLFOX 01/05/2023, 5-FU bolus eliminated, pump dose decreased due to diarrhea Radiation/Xeloda 02/08/2023-03/17/2023 CT chest/abdomen 04/17/2023: Stable pulmonary nodules no evidence of metastatic disease MRI pelvis 04/16/2023: Tumor at 3.5 cm from anal verge, decreased in thickness, decreased extension through the muscularis propria with 1 area of extension measuring 3 mm-potentially retraction scar, abutment of the mesorectal fascia at the 8 o'clock position, previously noted perirectal lymph node is no longer measurable, no new lymphadenopathy Flexible sigmoidoscopy 05/14/2023-small scar found in the rectum.  Scar tissue was healthy in appearance.  No evidence of residual cancer.  Biopsies obtained-colonic mucosa with hyperplastic and reactive changes.  Negative for dysplasia or malignancy.   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.  He has completed total neoadjuvant therapy for rectal cancer.  Recent restaging sigmoidoscopy shows a complete response.  He is scheduled to see Dr. Cliffton Asters tomorrow, question if he is a candidate for close observation versus proceeding with resection of the primary tumor.  His case will be presented at an upcoming GI tumor conference.  He will return for a CEA and follow-up visit in 3 months.  We will adjust follow-up accordingly pending his appointment with Dr. Cliffton Asters.  Patient seen with Dr. Truett Perna.  Qunicy Swiggum ANP/GNP-BC   05/25/2023  10:04 AM

## 2023-05-25 NOTE — Patient Instructions (Signed)

## 2023-05-26 ENCOUNTER — Encounter: Payer: Self-pay | Admitting: Oncology

## 2023-05-26 ENCOUNTER — Other Ambulatory Visit: Payer: No Typology Code available for payment source

## 2023-05-26 ENCOUNTER — Ambulatory Visit: Payer: No Typology Code available for payment source | Admitting: Nurse Practitioner

## 2023-05-27 ENCOUNTER — Other Ambulatory Visit: Payer: Self-pay

## 2023-06-02 ENCOUNTER — Other Ambulatory Visit: Payer: Self-pay | Admitting: *Deleted

## 2023-06-02 NOTE — Progress Notes (Signed)
The proposed treatment discussed in conference is for discussion purpose only and is not a binding recommendation.  The patients have not been physically examined, or presented with their treatment options.  Therefore, final treatment plans cannot be decided.

## 2023-06-14 ENCOUNTER — Other Ambulatory Visit: Payer: Self-pay

## 2023-06-16 ENCOUNTER — Other Ambulatory Visit: Payer: Self-pay

## 2023-07-07 ENCOUNTER — Ambulatory Visit: Payer: No Typology Code available for payment source | Attending: Cardiology | Admitting: Cardiology

## 2023-07-07 ENCOUNTER — Encounter: Payer: Self-pay | Admitting: Cardiology

## 2023-07-07 VITALS — BP 134/66 | HR 71 | Resp 16 | Ht 68.0 in | Wt 169.2 lb

## 2023-07-07 DIAGNOSIS — E782 Mixed hyperlipidemia: Secondary | ICD-10-CM | POA: Insufficient documentation

## 2023-07-07 DIAGNOSIS — I25118 Atherosclerotic heart disease of native coronary artery with other forms of angina pectoris: Secondary | ICD-10-CM | POA: Diagnosis not present

## 2023-07-07 DIAGNOSIS — I10 Essential (primary) hypertension: Secondary | ICD-10-CM | POA: Insufficient documentation

## 2023-07-07 DIAGNOSIS — Z720 Tobacco use: Secondary | ICD-10-CM | POA: Insufficient documentation

## 2023-07-07 DIAGNOSIS — I6523 Occlusion and stenosis of bilateral carotid arteries: Secondary | ICD-10-CM | POA: Insufficient documentation

## 2023-07-07 NOTE — Patient Instructions (Addendum)
 Medication Instructions:  Your physician recommends that you continue on your current medications as directed. Please refer to the Current Medication list given to you today.  *If you need a refill on your cardiac medications before your next appointment, please call your pharmacy*   Lab Work: Have lipid profile checked at [facility].  This will be fasting.  There is an office on the first floor of our building If you have labs (blood work) drawn today and your tests are completely normal, you will receive your results only by: MyChart Message (if you have MyChart) OR A paper copy in the mail If you have any lab test that is abnormal or we need to change your treatment, we will call you to review the results.   Testing/Procedures: Your physician has requested that you have a carotid duplex. This test is an ultrasound of the carotid arteries in your neck. It looks at blood flow through these arteries that supply the brain with blood. Allow one hour for this exam. There are no restrictions or special instructions.    Follow-Up: At [facility], you and your health needs are our priority.  As part of our continuing mission to provide you with exceptional heart care, we have created designated Provider Care Teams.  These Care Teams include your primary Cardiologist (physician) and Advanced Practice Providers (APPs -  Physician Assistants and Nurse Practitioners) who all work together to provide you with the care you need, when you need it.  We recommend signing up for the patient portal called "MyChart".  Sign up information is provided on this After Visit Summary.  MyChart is used to connect with patients for Virtual Visits (Telemedicine).  Patients are able to view lab/test results, encounter notes, upcoming appointments, etc.  Non-urgent messages can be sent to your provider as well.   To learn more about what you can do with MyChart, go to [email or web address].    Your next  appointment:   12 month(s)  Provider:   [name], MD     Other Instructions

## 2023-07-07 NOTE — Progress Notes (Addendum)
 Cardiology Office Note:  .   Date:  07/07/2023  ID:  Ronald [name], DOB Feb 02, 1946, MRN [id number] PCP: Greta Doom, MD  [facility] Providers Cardiologist:  [name], MD   History of Present Illness: .   Ronald [name] is a 78 y.o. male  with HTN, Hyperlipidemia, CAD, hypercholesterolemia, hypertension and multiple medication allergies and intolerances.  He also smokes cigars.    He has had NSTEMI in 2015 related to diseased RCA left alone and again on July 2023.  Due to recurrent angina pectoris, underwent repeat angiography and successful angioplasty to D1 branch of LAD on 12/23/2021.    He has been diagnosed with rectal cancer in Feb 2024 and finished RT and now scheduled for surgery soon. This is an annual visit. States that he is doing well and has not had any recurrence of angina pectoris.  Dyspnea has remained stable.  No PND, orthopnea or leg edema.  His wife is present.   Labs   Lab Results  Component Value Date   CHOL 122 11/20/2021   HDL 51 11/20/2021   LDLCALC 45 11/20/2021   TRIG 132 11/20/2021   CHOLHDL 2.4 11/20/2021   Lab Results  Component Value Date   NA 134 (L) 05/25/2023   K 3.8 05/25/2023   CO2 26 05/25/2023   GLUCOSE 159 (H) 05/25/2023   BUN 10 05/25/2023   CREATININE 0.83 05/25/2023   CALCIUM 8.8 (L) 05/25/2023   EGFR 74 03/12/2021   GFRNONAA >60 05/25/2023      Latest Ref Rng & Units 05/25/2023    9:29 AM 03/26/2023    9:09 AM 02/26/2023    7:57 AM  BMP  Glucose 70 - 99 mg/dL 409  811  914   BUN 8 - 23 mg/dL 10  15  12    Creatinine 0.61 - 1.24 mg/dL 7.82  9.56  2.13   Sodium 135 - 145 mmol/L 134  135  137   Potassium 3.5 - 5.1 mmol/L 3.8  3.8  4.1   Chloride 98 - 111 mmol/L 101  104  104   CO2 22 - 32 mmol/L 26  25  29    Calcium 8.9 - 10.3 mg/dL 8.8  8.7  9.4       Latest Ref Rng & Units 05/25/2023    9:29 AM 03/26/2023    9:09 AM 03/05/2023    8:36 AM  CBC  WBC 4.0 - 10.5 K/uL 4.3  4.4  3.4   Hemoglobin 13.0 - 17.0 g/dL  08.6  57.8  46.9   Hematocrit 39.0 - 52.0 % 33.6  34.5  32.8   Platelets 150 - 400 K/uL 147  128  117    Lab Results  Component Value Date   HGBA1C 5.9 (H) 11/19/2021    Lab Results  Component Value Date   TSH 1.281 11/19/2021     Review of Systems  Cardiovascular:  Negative for chest pain, dyspnea on exertion and leg swelling.   Physical Exam:   VS:  BP 134/66 (BP Location: Left Arm, Patient Position: Sitting, Cuff Size: Large)   Pulse 71   Resp 16   Ht 5\' 8"  (1.727 m)   Wt 169 lb 3.2 oz (76.7 kg)   SpO2 92%   BMI 25.73 kg/m    Wt Readings from Last 3 Encounters:  07/07/23 169 lb 3.2 oz (76.7 kg)  05/25/23 169 lb 4.8 oz (76.8 kg)  05/14/23 165 lb (74.8 kg)  Physical Exam Neck:     Vascular: No carotid bruit or JVD.  Cardiovascular:     Rate and Rhythm: Normal rate and regular rhythm.     Pulses: Intact distal pulses.     Heart sounds: Normal heart sounds. No murmur heard.    No gallop.  Pulmonary:     Effort: Pulmonary effort is normal.     Breath sounds: Normal breath sounds.  Abdominal:     General: Bowel sounds are normal.     Palpations: Abdomen is soft.  Musculoskeletal:     Right lower leg: No edema.     Left lower leg: No edema.    Studies Reviewed: .    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 Duplex suggests stenosis in the left internal carotid artery (1-15%). <50% stenosis in the left external carotid artery. Antegrade left vertebral artery flow. Compared to the study done on 01/19/2022, no significant change. Follow up in six months is appropriate if clinically indicated.   EKG:    EKG Interpretation Date/Time:  Wednesday July 07 2023 09:16:32 EST Ventricular Rate:  72 PR Interval:  152 QRS Duration:  96 QT Interval:  410 QTC Calculation: 448 R Axis:   -16  Text Interpretation: EKG 07/07/2023: Normal sinus rhythm at rate of 72 bpm, incomplete right bundle branch block.   Normal EKG.  Compared to 11/20/2021, anterolateral T wave inversion not present. Confirmed by Delrae Rend [phone]) on 07/07/2023 9:33:05 AM    Medications and allergies    Allergies  Allergen Reactions   Angiotensin Receptor Blockers Diarrhea   Beta Adrenergic Blockers Other (See Comments)    Fatigue   Zestril [Lisinopril] Rash     Current Outpatient Medications:    amLODipine (NORVASC) 5 MG tablet, Take 1 tablet (5 mg total) by mouth daily at 10 pm., Disp: 30 tablet, Rfl: 3   aspirin 81 MG EC tablet, Take 1 tablet (81 mg total) by mouth daily., Disp: 30 tablet, Rfl: 3   atorvastatin (LIPITOR) 80 MG tablet, Take 1 tablet (80 mg total) by mouth at bedtime., Disp: 90 tablet, Rfl: 3   cloNIDine (CATAPRES) 0.3 MG tablet, Take 1 tablet (0.3 mg total) by mouth 2 (two) times daily., Disp: 180 tablet, Rfl: 3   diphenhydramine-acetaminophen (TYLENOL PM) 25-500 MG TABS tablet, Take 2 tablets by mouth at bedtime., Disp: , Rfl:    Docusate Sodium (COLACE PO), Take 2 capsules by mouth 2 (two) times daily., Disp: , Rfl:    ezetimibe (ZETIA) 10 MG tablet, Take 1 tablet (10 mg total) by mouth daily., Disp: 100 tablet, Rfl: 3   isosorbide mononitrate (IMDUR) 30 MG 24 hr tablet, Take 1 tablet (30 mg total) by mouth every morning., Disp: 90 tablet, Rfl: 1   lidocaine-prilocaine (EMLA) cream, Apply 1 Application topically as needed (Use as directed to apply to port area 1-2 hours prior to appt)., Disp: 30 g, Rfl: 0   loperamide (IMODIUM A-D) 2 MG tablet, Take 1-2 tablets (2-4 mg total) by mouth 4 (four) times daily as needed for diarrhea or loose stools (Up to 8tabs/day). (Patient taking differently: Take 2-4 mg by mouth as needed for diarrhea or loose stools (Up to 8tabs/day).), Disp: , Rfl:    MAGNESIUM PO, Take 1 tablet by mouth at bedtime., Disp: , Rfl:    metoprolol succinate (TOPROL-XL) 25 MG 24 hr tablet, Take 1 tablet (25 mg total) by mouth daily., Disp: 90 tablet, Rfl: 3  nitroGLYCERIN (NITROSTAT)  0.4 MG SL tablet, Place 1 tablet (0.4 mg total) under the tongue every 5 (five) minutes x 3 doses as needed for chest pain., Disp: 25 tablet, Rfl: 2   Omega-3 Fatty Acids (FISH OIL PO), Take 1 capsule by mouth daily., Disp: , Rfl:    omeprazole (PRILOSEC OTC) 20 MG tablet, Take 1 tablet (20 mg total) by mouth daily., Disp: 30 tablet, Rfl: 3   ondansetron (ZOFRAN) 8 MG tablet, TAKE ONE TABLET BY MOUTH EVERY 8 HOURS AS NEEDED (MAY USE FOR NAUSEA 3 DAYS AFTER CHEMO), Disp: 30 tablet, Rfl: 1   polyethylene glycol (MIRALAX / GLYCOLAX) 17 g packet, Take 17 g by mouth 2 (two) times daily., Disp: , Rfl:    potassium chloride SA (KLOR-CON M) 20 MEQ tablet, Take 1 tablet (20 mEq total) by mouth 2 (two) times daily., Disp: 60 tablet, Rfl: 2   Probiotic Product (RESTORA) CAPS, Take 1 capsule by mouth daily., Disp: , Rfl:    prochlorperazine (COMPAZINE) 10 MG tablet, Take 1 tablet (10 mg total) by mouth every 6 (six) hours as needed for nausea or vomiting., Disp: 30 tablet, Rfl: 0   tamsulosin (FLOMAX) 0.4 MG CAPS capsule, Take 0.4 mg by mouth 2 (two) times daily., Disp: , Rfl:    TURMERIC PO, Take 2 tablets by mouth daily., Disp: , Rfl:  No current facility-administered medications for this visit.  Facility-Administered Medications Ordered in Other Visits:    sodium chloride flush (NS) 0.9 % injection 10 mL, 10 mL, Intravenous, PRN, Ladene Artist, MD, 10 mL at 09/21/22 0946   ASSESSMENT AND PLAN: .      ICD-10-CM   1. Coronary artery disease of native artery of native heart with stable angina pectoris (HCC)  I25.118 EKG 12-Lead    2. Primary hypertension  I10     3. Mixed hyperlipidemia  E78.2 Lipid Profile    4. Tobacco use  Z72.0       1. Coronary artery disease of native artery of native heart with stable angina pectoris [facility]  Patient is presently doing well and since being on a combination of amlodipine, isosorbide mononitrate and metoprolol succinate 25 mg daily, he has not had any  recurrence of angina pectoris.  He has small vessel disease as well, continue the same.  2. Primary hypertension Blood pressure is well-controlled on the combination of the Imdur and metoprolol succinate along with clonidine 0.3 mg twice daily.  He could not stop using clonidine in the past.  He could not tolerate ACE inhibitors or ARB due to rash and cough.  3. Mixed hyperlipidemia He needs lipid profile testing, however with the combination of atorvastatin 80 mg daily along with ezetimibe 10 mg daily, his lipids have been previously well-controlled.  4. Tobacco use He has PTSD and smokes cigars on a daily basis of 2 to 3 cigars a day.  He has had difficulty quitting smoking.  Again reinforced to try to see whether he could quit smoking.  Otherwise from cardiac standpoint he remains stable, I will see him back in a year or sooner if problems.  Carotid artery duplex will be scheduled to foollow up on asymptomatic stenosis Signed,  [name], MD, [facility] 07/07/2023, 9:51 AM [facility] [address] [place], [place] [address] Phone: [phone]. Fax:  [phone]

## 2023-07-07 NOTE — Addendum Note (Signed)
 Addended by: Dossie Arbour on: 07/07/2023 09:55 AM   Modules accepted: Orders

## 2023-08-09 ENCOUNTER — Encounter: Payer: Self-pay | Admitting: Cardiology

## 2023-08-09 ENCOUNTER — Ambulatory Visit (INDEPENDENT_AMBULATORY_CARE_PROVIDER_SITE_OTHER)

## 2023-08-09 DIAGNOSIS — I6523 Occlusion and stenosis of bilateral carotid arteries: Secondary | ICD-10-CM | POA: Diagnosis not present

## 2023-08-09 NOTE — Progress Notes (Signed)
 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  Right ECA greater than 50% stenosis. Left ICA 1 to 39% stenosis with calcific and heterogenous plaque.  Left ECA >50% stenosis. Bilateral antegrade vertebral artery flow and normal hemodynamics and subclavian arteries. No significant change from 07/22/2022.  Recheck in 1 year.

## 2023-08-10 ENCOUNTER — Telehealth: Payer: Self-pay | Admitting: *Deleted

## 2023-08-10 ENCOUNTER — Encounter: Payer: Self-pay | Admitting: *Deleted

## 2023-08-10 ENCOUNTER — Other Ambulatory Visit: Payer: Self-pay | Admitting: *Deleted

## 2023-08-10 DIAGNOSIS — C20 Malignant neoplasm of rectum: Secondary | ICD-10-CM

## 2023-08-10 NOTE — Telephone Encounter (Signed)
 Left VM that MRI pelvis was scheduled at [facility] on 08/17/23 at 3 pm with 2:30 pm arrival. Also sent via MyChart.

## 2023-08-10 NOTE — Progress Notes (Signed)
 Dr. Truett Perna confirmed that his pelvic MRI is due this month and CT chest due in December. Order placed and Ronald [name] was provided phone # to call central scheduling to set up his scan. Inquired how does the [facility] get [facility] with [facility]?

## 2023-08-11 ENCOUNTER — Encounter: Payer: Self-pay | Admitting: Cardiology

## 2023-08-11 LAB — LIPID PANEL
Chol/HDL Ratio: 1.8 ratio (ref 0.0–5.0)
Cholesterol, Total: 112 mg/dL (ref 100–199)
HDL: 61 mg/dL (ref >39)
LDL Chol Calc (NIH): 39 mg/dL (ref 0–99)
Triglycerides: 51 mg/dL (ref 0–149)
VLDL Cholesterol Cal: 12 mg/dL (ref 5–40)

## 2023-08-11 NOTE — Progress Notes (Signed)
Normal lipids

## 2023-08-17 ENCOUNTER — Ambulatory Visit (HOSPITAL_COMMUNITY)
Admission: RE | Admit: 2023-08-17 | Discharge: 2023-08-17 | Disposition: A | Discharge status: Home / Self Care | Source: Ambulatory Visit | Attending: Oncology | Admitting: Oncology

## 2023-08-17 DIAGNOSIS — C20 Malignant neoplasm of rectum: Secondary | ICD-10-CM | POA: Diagnosis present

## 2023-08-17 NOTE — Telephone Encounter (Signed)
 Pt returning call

## 2023-08-18 NOTE — Telephone Encounter (Signed)
 Pt is returning call to nurse.

## 2023-08-23 ENCOUNTER — Inpatient Hospital Stay: Payer: No Typology Code available for payment source

## 2023-08-23 ENCOUNTER — Inpatient Hospital Stay: Payer: No Typology Code available for payment source | Attending: Nurse Practitioner | Admitting: Oncology

## 2023-08-23 ENCOUNTER — Other Ambulatory Visit: Payer: No Typology Code available for payment source

## 2023-08-23 VITALS — BP 125/64 | HR 78 | Temp 98.2°F | Resp 18 | Ht 68.0 in | Wt 170.2 lb

## 2023-08-23 DIAGNOSIS — E785 Hyperlipidemia, unspecified: Secondary | ICD-10-CM | POA: Diagnosis not present

## 2023-08-23 DIAGNOSIS — F4310 Post-traumatic stress disorder, unspecified: Secondary | ICD-10-CM | POA: Insufficient documentation

## 2023-08-23 DIAGNOSIS — N400 Enlarged prostate without lower urinary tract symptoms: Secondary | ICD-10-CM | POA: Insufficient documentation

## 2023-08-23 DIAGNOSIS — Z85048 Personal history of other malignant neoplasm of rectum, rectosigmoid junction, and anus: Secondary | ICD-10-CM | POA: Insufficient documentation

## 2023-08-23 DIAGNOSIS — I10 Essential (primary) hypertension: Secondary | ICD-10-CM | POA: Insufficient documentation

## 2023-08-23 DIAGNOSIS — C20 Malignant neoplasm of rectum: Secondary | ICD-10-CM

## 2023-08-23 DIAGNOSIS — I2510 Atherosclerotic heart disease of native coronary artery without angina pectoris: Secondary | ICD-10-CM | POA: Diagnosis not present

## 2023-08-23 LAB — CEA (ACCESS): CEA (CHCC): 5.64 ng/mL — ABNORMAL HIGH (ref 0.00–5.00)

## 2023-08-23 NOTE — Progress Notes (Signed)
 [facility] OFFICE PROGRESS NOTE   Diagnosis: Rectal cancer  INTERVAL HISTORY:   Mr. Held returns as scheduled.  He feels well.  Good appetite.  No difficulty with bowel function.  No bleeding.  Mild remaining numbness in the fingers and toes.  Objective:  Vital signs in last 24 hours:  Blood pressure 125/64, pulse 78, temperature 98.2 F (36.8 C), temperature source Temporal, resp. rate 18, height 5\' 8"  (1.727 m), weight 170 lb 3.2 oz (77.2 kg), SpO2 99%.     Lymphatics: No cervical, supraclavicular, axillary, or inguinal nodes Resp: Lungs clear bilaterally Cardio: Regular rate and rhythm GI: No hepatosplenomegaly, no mass, nontender Vascular: No leg edema   Portacath/PICC-without erythema  Lab Results:  Lab Results  Component Value Date   WBC 4.3 05/25/2023   HGB 11.9 (L) 05/25/2023   HCT 33.6 (L) 05/25/2023   MCV 95.5 05/25/2023   PLT 147 (L) 05/25/2023   NEUTROABS 2.7 05/25/2023    CMP  Lab Results  Component Value Date   NA 134 (L) 05/25/2023   K 3.8 05/25/2023   CL 101 05/25/2023   CO2 26 05/25/2023   GLUCOSE 159 (H) 05/25/2023   BUN 10 05/25/2023   CREATININE 0.83 05/25/2023   CALCIUM  8.8 (L) 05/25/2023   PROT 6.2 (L) 05/25/2023   ALBUMIN 4.2 05/25/2023   AST 18 05/25/2023   ALT 18 05/25/2023   ALKPHOS 65 05/25/2023   BILITOT 0.6 05/25/2023   GFRNONAA >60 05/25/2023   GFRAA 84 02/10/2019    Lab Results  Component Value Date   CEA1 23.9 (H) 07/24/2022   CEA 2.64 05/25/2023    Lab Results  Component Value Date   INR 0.93 01/14/2014   LABPROT 12.5 01/14/2014    Imaging:  No results found.  Medications: I have reviewed the patient's current medications.   Assessment/Plan: Rectal cancer Colonoscopy 07/24/2022-mass in the posterior rectum extending from the dentate line for 3 cm, biopsy-moderately differentiated invasive adenocarcinoma, mismatch repair protein expression intact CT abdomen/pelvis 04/07/2022-cardiac  enlargement, hepatic steatosis mild left adrenal nodularity stable compared to chest CT March 2020, small nonobstructing bilateral renal calculi, extensive colonic diverticula CT chest 06/03/2022-6 mm nodule in the superior right lower lobe-new from 07/12/2018, new 1-2 mm nodule within the left upper lobe Elevated CEA 07/24/2022 MRI pelvis 08/06/2022-tumor at 3.5 cm from the anal verge with involvement of the upper anal sphincter, invasion of the inferior right levator ani muscle, T4b, one 5 mm left posterior perirectal node-N 1 CTs 08/19/2022-partially circumferential mass of the low rectum.  Small bilateral pulmonary nodules slightly enlarged compared to prior examination.  Plan for follow-up CT chest after completing 6 cycles of chemotherapy. Cycle 1 FOLFOX 08/24/2022 Cycle 2 FOLFOX 09/07/2022 Chemotherapy held 09/21/2022 due to severe neutropenia, thrombocytopenia Cycle 3 FOLFOX 10/05/2022, Udenyca , oxaliplatin  dose reduced secondary to thrombocytopenia following cycle 2 Treatment held 10/20/2022 due to thrombocytopenia Cycle 4 FOLFOX 10/27/2022, Udenyca , oxaliplatin  dose reduced secondary to thrombocytopenia following cycle 3 Cycle 5 FOLFOX 11/09/2022, Udenyca  Cycle 6 FOLFOX 11/24/2022, Udenyca  Cycle 7 FOLFOX 12/08/2022, Udenyca  CTs 12/16/2022-similar appearance of circumferential low rectal wall mass; decreased size of scattered small bilateral pulmonary nodules.  No new suspicious pulmonary nodules or masses.  No evidence of metastatic disease in the abdomen or pelvis. Cycle 8 FOLFOX 01/05/2023, 5-FU bolus eliminated, pump dose decreased due to diarrhea Radiation/Xeloda 02/08/2023-03/17/2023 CT chest/abdomen 04/17/2023: Stable pulmonary nodules no evidence of metastatic disease MRI pelvis 04/16/2023: Tumor at 3.5 cm from anal verge, decreased in thickness,  decreased extension through the muscularis propria with 1 area of extension measuring 3 mm-potentially retraction scar, abutment of the mesorectal fascia at the  8 o'clock position, previously noted perirectal lymph node is no longer measurable, no new lymphadenopathy Flexible sigmoidoscopy 05/14/2023-small scar found in the rectum.  Scar tissue was healthy in appearance.  No evidence of residual cancer.  Biopsies obtained-colonic mucosa with hyperplastic and reactive changes.  Negative for dysplasia or malignancy. MRI pelvis 08/17/2023: Further reduction in thickness at the low rectum/anal canal, no lymphadenopathy   CAD, status post myocardial infarction 2015 and July 2023, D1 branch LAD angioplasty 12/23/2021 Hyperlipidemia Hypertension PTSD BPH Cigar use       Disposition: Mr Wegener is in clinical remission from rectal cancer.  The restaging MRI reveals no evidence of disease progression.  He will be scheduled for surveillance CTs in June.  He will be scheduled for a surveillance pelvic MRI in July.  We will refer him to Dr. Nickey Barn for a surveillance sigmoidoscopy. He will return for an office visit after the pelvic MRI. He will be scheduled for a Port-A-Cath flush and labs in approximately 8 weeks. Coni Deep, MD  08/23/2023  10:36 AM

## 2023-08-24 ENCOUNTER — Telehealth: Payer: Self-pay | Admitting: *Deleted

## 2023-08-24 ENCOUNTER — Other Ambulatory Visit: Payer: Self-pay

## 2023-08-24 NOTE — Telephone Encounter (Signed)
 Notified that the CEA is mildly elevated, just above the top end of the normal range, repeat CEA scheduled for 8 weeks with Port-A-Cath flush, we have refered you to Dr. Nickey Barn for a surveillance sigmoidoscopy within the next 1 month, follow-up scheduled. He agrees to plan

## 2023-08-24 NOTE — Telephone Encounter (Signed)
-----   Message from Coni Deep sent at 08/23/2023  3:11 PM EDT ----- Please call patient, the CEA is mildly elevated, just above the top end of the normal range, repeat CEA scheduled for 8 weeks with Port-A-Cath flush, we will refer him to Dr. Nickey Barn for a surveillance sigmoidoscopy within the next 1 month, follow-up scheduled

## 2023-08-25 ENCOUNTER — Encounter: Payer: Self-pay | Admitting: *Deleted

## 2023-08-25 NOTE — Progress Notes (Signed)
 Faxed recent note, scan, med list and lab to Dr. Nickey Barn [phone] requesting sigmoidoscopy in next month at Dr. Enedina Harrow request. Patient is aware.

## 2023-08-26 ENCOUNTER — Other Ambulatory Visit: Payer: Self-pay

## 2023-09-02 ENCOUNTER — Telehealth: Payer: Self-pay | Admitting: *Deleted

## 2023-09-02 NOTE — Telephone Encounter (Signed)
 Called to report the left hip/buttock pain he mentioned to MD in past has increased in frequency and intensity. Sharp pain that will last for several hours--rest and Aleve will help the pain. Will radiate down his left leg as well. He has not noticed any loss of strength in this leg, but the pain makes ambulation difficult when it hits. Asking if last MRI showed anything that could be causing it or what can be done to determine cause and how to treat?

## 2023-09-02 NOTE — Telephone Encounter (Signed)
 Per Dr. Scherrie Curt: Needs MRI spine to assess this issue and would be best done by ortho or NS. [facility] for [facility] expired on 4/23 and he has not been back there to request renewal (missed his 4/28 appointment). He requests our office reach out to start process (sent request to nurse navigator). He said he will go to the emergency room at [facility] to initiate this workup. Appreciates return call.

## 2023-09-03 ENCOUNTER — Other Ambulatory Visit: Payer: Self-pay | Admitting: Gastroenterology

## 2023-09-10 ENCOUNTER — Other Ambulatory Visit: Payer: Self-pay

## 2023-09-10 ENCOUNTER — Encounter (HOSPITAL_COMMUNITY): Payer: Self-pay | Admitting: Gastroenterology

## 2023-09-17 ENCOUNTER — Ambulatory Visit (HOSPITAL_COMMUNITY): Admitting: Anesthesiology

## 2023-09-17 ENCOUNTER — Ambulatory Visit (HOSPITAL_COMMUNITY)
Admission: RE | Admit: 2023-09-17 | Discharge: 2023-09-17 | Disposition: A | Discharge status: Home / Self Care | Attending: Gastroenterology | Admitting: Gastroenterology

## 2023-09-17 ENCOUNTER — Other Ambulatory Visit: Payer: Self-pay

## 2023-09-17 ENCOUNTER — Ambulatory Visit (HOSPITAL_BASED_OUTPATIENT_CLINIC_OR_DEPARTMENT_OTHER): Admitting: Anesthesiology

## 2023-09-17 ENCOUNTER — Encounter (HOSPITAL_COMMUNITY)
Admission: RE | Disposition: A | Discharge status: Home / Self Care | Payer: Self-pay | Source: Home / Self Care | Attending: Gastroenterology

## 2023-09-17 ENCOUNTER — Encounter (HOSPITAL_COMMUNITY): Payer: Self-pay | Admitting: Gastroenterology

## 2023-09-17 DIAGNOSIS — Z923 Personal history of irradiation: Secondary | ICD-10-CM | POA: Insufficient documentation

## 2023-09-17 DIAGNOSIS — K6289 Other specified diseases of anus and rectum: Secondary | ICD-10-CM | POA: Diagnosis not present

## 2023-09-17 DIAGNOSIS — Z955 Presence of coronary angioplasty implant and graft: Secondary | ICD-10-CM | POA: Diagnosis not present

## 2023-09-17 DIAGNOSIS — F17290 Nicotine dependence, other tobacco product, uncomplicated: Secondary | ICD-10-CM | POA: Diagnosis not present

## 2023-09-17 DIAGNOSIS — J449 Chronic obstructive pulmonary disease, unspecified: Secondary | ICD-10-CM | POA: Diagnosis not present

## 2023-09-17 DIAGNOSIS — I10 Essential (primary) hypertension: Secondary | ICD-10-CM | POA: Insufficient documentation

## 2023-09-17 DIAGNOSIS — Z9221 Personal history of antineoplastic chemotherapy: Secondary | ICD-10-CM | POA: Diagnosis not present

## 2023-09-17 DIAGNOSIS — Z08 Encounter for follow-up examination after completed treatment for malignant neoplasm: Secondary | ICD-10-CM | POA: Insufficient documentation

## 2023-09-17 DIAGNOSIS — C20 Malignant neoplasm of rectum: Secondary | ICD-10-CM | POA: Insufficient documentation

## 2023-09-17 DIAGNOSIS — I2510 Atherosclerotic heart disease of native coronary artery without angina pectoris: Secondary | ICD-10-CM | POA: Insufficient documentation

## 2023-09-17 DIAGNOSIS — Z1211 Encounter for screening for malignant neoplasm of colon: Secondary | ICD-10-CM | POA: Diagnosis not present

## 2023-09-17 DIAGNOSIS — F17210 Nicotine dependence, cigarettes, uncomplicated: Secondary | ICD-10-CM

## 2023-09-17 DIAGNOSIS — I252 Old myocardial infarction: Secondary | ICD-10-CM | POA: Insufficient documentation

## 2023-09-17 HISTORY — PX: FLEXIBLE SIGMOIDOSCOPY: SHX5431

## 2023-09-17 HISTORY — PX: BIOPSY OF SKIN SUBCUTANEOUS TISSUE AND/OR MUCOUS MEMBRANE: SHX6741

## 2023-09-17 SURGERY — SIGMOIDOSCOPY, FLEXIBLE
Anesthesia: Monitor Anesthesia Care

## 2023-09-17 MED ORDER — LIDOCAINE 2% (20 MG/ML) 5 ML SYRINGE
INTRAMUSCULAR | Status: DC | PRN
  Administered 2023-09-17: 50 mg via INTRAVENOUS

## 2023-09-17 MED ORDER — LACTATED RINGERS IV SOLN: INTRAVENOUS | Status: DC | PRN

## 2023-09-17 MED ORDER — GLYCOPYRROLATE 0.2 MG/ML IJ SOLN
INTRAMUSCULAR | Status: DC | PRN
Start: 2023-09-17 — End: 2023-09-17
  Administered 2023-09-17: .2 mg via INTRAVENOUS

## 2023-09-17 MED ORDER — PROPOFOL 500 MG/50ML IV EMUL
INTRAVENOUS | Status: DC | PRN
  Administered 2023-09-17: 20 mg via INTRAVENOUS
  Administered 2023-09-17: 50 mg via INTRAVENOUS
  Administered 2023-09-17: 120 ug/kg/min via INTRAVENOUS

## 2023-09-17 MED ORDER — SODIUM CHLORIDE 0.9 % IV SOLN
INTRAVENOUS | Status: AC | PRN
  Administered 2023-09-17: 500 mL via INTRAVENOUS

## 2023-09-17 MED ORDER — PROPOFOL 500 MG/50ML IV EMUL
INTRAVENOUS | Status: AC
  Filled 2023-09-17: qty 50

## 2023-09-17 NOTE — Anesthesia Postprocedure Evaluation (Signed)
 Anesthesia Post Note  Patient: Ronald [name]  Procedure(s) Performed: SIGMOIDOSCOPY, FLEXIBLE BIOPSY, SKIN, SUBCUTANEOUS TISSUE, OR MUCOUS MEMBRANE     Patient location during evaluation: Endoscopy Anesthesia Type: MAC Level of consciousness: awake and alert, patient cooperative and oriented Pain management: pain level controlled Vital Signs Assessment: post-procedure vital signs reviewed and stable Respiratory status: spontaneous breathing, nonlabored ventilation and respiratory function stable Cardiovascular status: stable and blood pressure returned to baseline Postop Assessment: no apparent nausea or vomiting and adequate PO intake Anesthetic complications: no   No notable events documented.  Last Vitals:  Vitals:   09/17/23 1030 09/17/23 1040  BP: 112/61 (!) 109/54  Pulse: (!) 58 (!) 59  Resp: 10 12  Temp:    SpO2: 97% 98%    Last Pain:  Vitals:   09/17/23 1030  TempSrc:   PainSc: 0-No pain                 Jessie Cowher,E. Kayla Weekes

## 2023-09-17 NOTE — Transfer of Care (Signed)
 Immediate Anesthesia Transfer of Care Note  Patient: Ronald [name]  Procedure(s) Performed: SIGMOIDOSCOPY, FLEXIBLE BIOPSY, SKIN, SUBCUTANEOUS TISSUE, OR MUCOUS MEMBRANE  Patient Location: PACU  Anesthesia Type:MAC  Level of Consciousness: awake, alert , oriented, and drowsy  Airway & Oxygen Therapy: Patient Spontanous Breathing and Patient connected to face mask oxygen  Post-op Assessment: Report given to RN and Post -op Vital signs reviewed and stable  Post vital signs: Reviewed and stable  Last Vitals:  Vitals Value Taken Time  BP    Temp    Pulse    Resp    SpO2      Last Pain:  Vitals:   09/17/23 0910  TempSrc: Temporal  PainSc: 0-No pain         Complications: No notable events documented.

## 2023-09-17 NOTE — Op Note (Signed)
 [facility] Patient Name: Ronald [name] Procedure Date: 09/17/2023 MRN: [id number] Attending MD: Alvis Jourdain , MD, [id number] Date of Birth: [birth year] CSN: [id number] Age: 78 Admit Type: Ambulatory Procedure:                Flexible Sigmoidoscopy Indications:              High risk colon cancer surveillance: Personal                            history of rectal cancer Providers:                Alvis Jourdain, MD, Suzann Ernst, RN, Gabino Joe, Technician Referring MD:              Medicines:                Propofol  per Anesthesia Complications:            No immediate complications. Estimated Blood Loss:     Estimated blood loss was minimal. Procedure:                Pre-Anesthesia Assessment:                           - Prior to the procedure, a History and Physical                            was performed, and patient medications and                            allergies were reviewed. The patient's tolerance of                            previous anesthesia was also reviewed. The risks                            and benefits of the procedure and the sedation                            options and risks were discussed with the patient.                            All questions were answered, and informed consent                            was obtained. Prior Anticoagulants: The patient has                            taken no anticoagulant or antiplatelet agents. ASA                            Grade Assessment: III - A patient with severe  systemic disease. After reviewing the risks and                            benefits, the patient was deemed in satisfactory                            condition to undergo the procedure.                           - Sedation was administered by an anesthesia                            professional. Deep sedation was attained.                           After obtaining informed  consent, the scope was                            passed under direct vision. The [id number]-[id number] ([id number])                            Olympus endoscope was introduced through the anus                            and advanced to the the sigmoid colon. The flexible                            sigmoidoscopy was accomplished without difficulty.                            The patient tolerated the procedure well. The                            quality of the bowel preparation was good. Scope In: 10:15:34 AM Scope Out: 10:19:30 AM Total Procedure Duration: 0 hours 3 minutes 56 seconds  Findings:      A medium scar was found in the distal rectum. The scar tissue was       healthy in appearance. Biopsies were taken with a cold forceps for       histology.      The digital rectal examination was normal. No evidence of any masses and       the distal rectum was soft with palpation. The endoscopic view showed       scar, but no evidence of disease. Cold biopsies were obtained to ensure       no residual disease. Impression:               - Scar in the distal rectum. Biopsied. Moderate Sedation:      Not Applicable - Patient had care per Anesthesia. Recommendation:           - Patient has a contact number available for                            emergencies. The signs and symptoms of potential  delayed complications were discussed with the                            patient. Return to normal activities tomorrow.                            Written discharge instructions were provided to the                            patient.                           - Resume regular diet. Procedure Code(s):        --- Professional ---                           [id number], Sigmoidoscopy, flexible; with biopsy, single                            or multiple Diagnosis Code(s):        --- Professional ---                           Z85.048, Personal history of other malignant                             neoplasm of rectum, rectosigmoid junction, and anus                           K62.89, Other specified diseases of anus and rectum CPT copyright 2022 [employer]. All rights reserved. The codes documented in this report are preliminary and upon coder review may  be revised to meet current compliance requirements. Alvis Jourdain, MD Alvis Jourdain, MD 09/17/2023 10:34:22 AM This report has been signed electronically. Number of Addenda: 0

## 2023-09-17 NOTE — Anesthesia Preprocedure Evaluation (Addendum)
 Anesthesia Evaluation  Patient identified by MRN, date of birth, ID band Patient awake    Reviewed: Allergy & Precautions, NPO status , Patient's Chart, lab work & pertinent test results, reviewed documented beta blocker date and time   History of Anesthesia Complications Negative for: history of anesthetic complications  Airway Mallampati: II  TM Distance: >3 FB Neck ROM: Full    Dental  (+) Poor Dentition, Dental Advisory Given, Chipped   Pulmonary COPD, Current Smoker and Patient abstained from smoking.   breath sounds clear to auscultation       Cardiovascular hypertension, Pt. on medications and Pt. on home beta blockers (-) angina + CAD, + Past MI and + Cardiac Stents   Rhythm:Regular Rate:Normal  '23 ECHO: EF 50 to 55%.  1. The LV has low normal function, no regional wall motion abnormalities. There is mild left ventricular hypertrophy. Left ventricular diastolic  parameters were normal. The E/e' is 13.8.   2. RVF is normal. The right ventricular size is normal.   3. The mitral valve is grossly normal. No evidence of mitral valve regurgitation. No evidence of mitral stenosis.   4. The aortic valve is tricuspid. Aortic valve regurgitation is not visualized. Aortic valve sclerosis is present, with no evidence of aortic valve stenosis.     Neuro/Psych    GI/Hepatic Neg liver ROS,GERD  Medicated and Controlled,,Rectal cancer   Endo/Other  negative endocrine ROS    Renal/GU negative Renal ROS     Musculoskeletal   Abdominal   Peds  Hematology negative hematology ROS (+)   Anesthesia Other Findings   Reproductive/Obstetrics                             Anesthesia Physical Anesthesia Plan  ASA: 3  Anesthesia Plan: MAC   Post-op Pain Management: Minimal or no pain anticipated   Induction:   PONV Risk Score and Plan: 0  Airway Management Planned: Natural Airway and Simple Face  Mask  Additional Equipment: None  Intra-op Plan:   Post-operative Plan:   Informed Consent: I have reviewed the patients History and Physical, chart, labs and discussed the procedure including the risks, benefits and alternatives for the proposed anesthesia with the patient or authorized representative who has indicated his/her understanding and acceptance.     Dental advisory given  Plan Discussed with: CRNA and Surgeon  Anesthesia Plan Comments:         Anesthesia Quick Evaluation

## 2023-09-17 NOTE — Discharge Instructions (Signed)

## 2023-09-17 NOTE — H&P (Signed)
 Ronald [name] HPI: The patient is here for reevaluation of his rectal cancer s/p chemo/XRT.  Past Medical History:  Diagnosis Date   Bronchitis    Cancer [facility])    colon   History of kidney stones    Hyperlipidemia    Hypertension    Myocardial infarction [facility])     Past Surgical History:  Procedure Laterality Date   BIOPSY  07/24/2022   Procedure: BIOPSY;  Surgeon: Alvis Jourdain, MD;  Location: [facility];  Service: Gastroenterology;;   BIOPSY  05/14/2023   Procedure: BIOPSY;  Surgeon: Alvis Jourdain, MD;  Location: [facility];  Service: Gastroenterology;;   COLONOSCOPY WITH PROPOFOL  N/A 07/24/2022   Procedure: COLONOSCOPY WITH PROPOFOL ;  Surgeon: Alvis Jourdain, MD;  Location: [facility] ENDOSCOPY;  Service: Gastroenterology;  Laterality: N/A;   CORONARY STENT INTERVENTION N/A 12/23/2021   Procedure: CORONARY STENT INTERVENTION;  Surgeon: Knox Perl, MD;  Location: [facility] INVASIVE CV LAB;  Service: Cardiovascular;  Laterality: N/A;   FLEXIBLE SIGMOIDOSCOPY N/A 05/14/2023   Procedure: FLEXIBLE SIGMOIDOSCOPY;  Surgeon: Alvis Jourdain, MD;  Location: [facility] ENDOSCOPY;  Service: Gastroenterology;  Laterality: N/A;   LEFT HEART CATH AND CORONARY ANGIOGRAPHY N/A 11/19/2021   Procedure: LEFT HEART CATH AND CORONARY ANGIOGRAPHY;  Surgeon: Cody Das, MD;  Location: [facility] INVASIVE CV LAB;  Service: Cardiovascular;  Laterality: N/A;   LEFT HEART CATHETERIZATION WITH CORONARY ANGIOGRAM N/A 01/15/2014   Procedure: LEFT HEART CATHETERIZATION WITH CORONARY ANGIOGRAM;  Surgeon: Arlander Bellman, MD;  Location: [facility] CATH LAB;  Service: Cardiovascular;  Laterality: N/A;   PORTACATH PLACEMENT N/A 08/18/2022   Procedure: INSERTION PORT-A-CATH;  Surgeon: Melvenia Stabs, MD;  Location: [facility] ORS;  Service: General;  Laterality: N/A;  60    History reviewed. No pertinent family history.  Social History:  reports that he has been smoking cigars. He has never used smokeless tobacco. He reports current alcohol use.  He reports that he does not use drugs.  Allergies:  Allergies  Allergen Reactions   Angiotensin Receptor Blockers Diarrhea   Beta Adrenergic Blockers Other (See Comments)    Fatigue   Zestril  [Lisinopril ] Rash    Medications: Scheduled: Continuous:  sodium chloride  500 mL (09/17/23 0923)    No results found for this or any previous visit (from the past 24 hours).   No results found.  ROS:  As stated above in the HPI otherwise negative.  Blood pressure 119/62, temperature 98 F (36.7 C), temperature source Temporal, resp. rate 17, height 5\' 8"  (1.727 m), weight 77.1 kg, SpO2 96%.    PE: Gen: NAD, Alert and Oriented HEENT:  [place]/AT, EOMI Neck: Supple, no LAD Lungs: CTA Bilaterally CV: RRR without M/G/R ABD: Soft, NTND, +BS Ext: No C/C/E  Assessment/Plan: 1) Rectal cancer - FFS.  Theordore Cisnero D 09/17/2023, 10:12 AM

## 2023-09-19 ENCOUNTER — Encounter (HOSPITAL_COMMUNITY): Payer: Self-pay | Admitting: Gastroenterology

## 2023-09-20 ENCOUNTER — Encounter: Payer: Self-pay | Admitting: Physical Medicine and Rehabilitation

## 2023-09-20 ENCOUNTER — Encounter: Payer: Self-pay | Admitting: Oncology

## 2023-09-20 LAB — SURGICAL PATHOLOGY

## 2023-09-22 ENCOUNTER — Other Ambulatory Visit: Payer: Self-pay

## 2023-10-18 ENCOUNTER — Ambulatory Visit (HOSPITAL_BASED_OUTPATIENT_CLINIC_OR_DEPARTMENT_OTHER)
Admission: RE | Admit: 2023-10-18 | Discharge: 2023-10-18 | Disposition: A | Discharge status: Home / Self Care | Source: Ambulatory Visit | Attending: Oncology | Admitting: Oncology

## 2023-10-18 ENCOUNTER — Inpatient Hospital Stay

## 2023-10-18 ENCOUNTER — Inpatient Hospital Stay: Attending: Nurse Practitioner

## 2023-10-18 DIAGNOSIS — Z95828 Presence of other vascular implants and grafts: Secondary | ICD-10-CM

## 2023-10-18 DIAGNOSIS — C20 Malignant neoplasm of rectum: Secondary | ICD-10-CM

## 2023-10-18 DIAGNOSIS — K8020 Calculus of gallbladder without cholecystitis without obstruction: Secondary | ICD-10-CM | POA: Diagnosis not present

## 2023-10-18 DIAGNOSIS — N200 Calculus of kidney: Secondary | ICD-10-CM | POA: Diagnosis not present

## 2023-10-18 DIAGNOSIS — C7800 Secondary malignant neoplasm of unspecified lung: Secondary | ICD-10-CM | POA: Diagnosis not present

## 2023-10-18 LAB — CEA (ACCESS): CEA (CHCC): 11.45 ng/mL — ABNORMAL HIGH (ref 0.00–5.00)

## 2023-10-18 LAB — BASIC METABOLIC PANEL - CANCER CENTER ONLY
Anion gap: 13 (ref 5–15)
BUN: 12 mg/dL (ref 8–23)
CO2: 22 mmol/L (ref 22–32)
Calcium: 9.1 mg/dL (ref 8.9–10.3)
Chloride: 100 mmol/L (ref 98–111)
Creatinine: 0.94 mg/dL (ref 0.61–1.24)
GFR, Estimated: >60 mL/min (ref >60)
Glucose, Bld: 164 mg/dL — ABNORMAL HIGH (ref 70–99)
Potassium: 3.6 mmol/L (ref 3.5–5.1)
Sodium: 136 mmol/L (ref 135–145)

## 2023-10-18 MED ORDER — IOHEXOL 300 MG/ML  SOLN
100.0000 mL | Freq: Once | INTRAMUSCULAR | Status: AC | PRN
  Administered 2023-10-18: 100 mL via INTRAVENOUS

## 2023-10-18 MED ORDER — HEPARIN SOD (PORK) LOCK FLUSH 100 UNIT/ML IV SOLN
500.0000 [IU] | Freq: Once | INTRAVENOUS | Status: AC
  Administered 2023-10-18: 500 [IU] via INTRAVENOUS

## 2023-10-18 MED ORDER — SODIUM CHLORIDE 0.9% FLUSH: 3.0000 mL | INTRAVENOUS | Status: DC | PRN

## 2023-10-18 MED ORDER — SODIUM CHLORIDE 0.9% FLUSH: 3.0000 mL | Freq: Two times a day (BID) | INTRAVENOUS | Status: DC

## 2023-10-26 ENCOUNTER — Encounter: Payer: Self-pay | Admitting: *Deleted

## 2023-10-26 NOTE — Progress Notes (Signed)
 MD reviewed CT scan. Needs 30 minute scan review appointment w/NP on 6/27. High priority scheduling message sent.

## 2023-10-29 ENCOUNTER — Inpatient Hospital Stay (HOSPITAL_BASED_OUTPATIENT_CLINIC_OR_DEPARTMENT_OTHER): Admitting: Nurse Practitioner

## 2023-10-29 ENCOUNTER — Encounter: Payer: Self-pay | Admitting: *Deleted

## 2023-10-29 VITALS — BP 144/64 | HR 79 | Temp 98.0°F | Resp 18 | Ht 68.0 in | Wt 168.9 lb

## 2023-10-29 DIAGNOSIS — I252 Old myocardial infarction: Secondary | ICD-10-CM | POA: Insufficient documentation

## 2023-10-29 DIAGNOSIS — I2510 Atherosclerotic heart disease of native coronary artery without angina pectoris: Secondary | ICD-10-CM | POA: Diagnosis not present

## 2023-10-29 DIAGNOSIS — C20 Malignant neoplasm of rectum: Secondary | ICD-10-CM | POA: Diagnosis not present

## 2023-10-29 DIAGNOSIS — Z85048 Personal history of other malignant neoplasm of rectum, rectosigmoid junction, and anus: Secondary | ICD-10-CM | POA: Diagnosis not present

## 2023-10-29 DIAGNOSIS — N400 Enlarged prostate without lower urinary tract symptoms: Secondary | ICD-10-CM | POA: Insufficient documentation

## 2023-10-29 DIAGNOSIS — Z923 Personal history of irradiation: Secondary | ICD-10-CM | POA: Insufficient documentation

## 2023-10-29 DIAGNOSIS — Z9221 Personal history of antineoplastic chemotherapy: Secondary | ICD-10-CM | POA: Diagnosis not present

## 2023-10-29 DIAGNOSIS — F17210 Nicotine dependence, cigarettes, uncomplicated: Secondary | ICD-10-CM | POA: Insufficient documentation

## 2023-10-29 DIAGNOSIS — R918 Other nonspecific abnormal finding of lung field: Secondary | ICD-10-CM | POA: Diagnosis not present

## 2023-10-29 DIAGNOSIS — I10 Essential (primary) hypertension: Secondary | ICD-10-CM | POA: Diagnosis not present

## 2023-10-29 DIAGNOSIS — E785 Hyperlipidemia, unspecified: Secondary | ICD-10-CM | POA: Diagnosis not present

## 2023-10-29 DIAGNOSIS — F4310 Post-traumatic stress disorder, unspecified: Secondary | ICD-10-CM | POA: Diagnosis not present

## 2023-10-29 NOTE — Progress Notes (Signed)
 [facility] [name] OFFICE PROGRESS NOTE   Diagnosis: Rectal cancer  INTERVAL HISTORY:   [name] returns for follow-up.  Overall feels well.  Bowel habits continue to be irregular.  No bleeding or pain with bowel movements.  No nausea or vomiting.  No abdominal pain.  He has a good appetite.  Objective:  Vital signs in last 24 hours:  Blood pressure (!) 144/64, pulse 79, temperature 98 F (36.7 C), temperature source Temporal, resp. rate 18, height 5' 8 (1.727 m), weight 168 lb 14.4 oz (76.6 kg), SpO2 98%.    Lymphatics: No palpable cervical, supraclavicular, axillary or inguinal lymph nodes. Resp: Lungs clear bilaterally. Cardio: Regular rate and rhythm. GI: No hepatosplenomegaly.  No mass. Vascular: No leg edema.   Lab Results:  Lab Results  Component Value Date   WBC 4.3 05/25/2023   HGB 11.9 (L) 05/25/2023   HCT 33.6 (L) 05/25/2023   MCV 95.5 05/25/2023   PLT 147 (L) 05/25/2023   NEUTROABS 2.7 05/25/2023    Imaging:  No results found.  Medications: I have reviewed the patient's current medications.  Assessment/Plan: Rectal cancer Colonoscopy 07/24/2022-mass in the posterior rectum extending from the dentate line for 3 cm, biopsy-moderately differentiated invasive adenocarcinoma, mismatch repair protein expression intact, microsatellite stable CT abdomen/pelvis 04/07/2022-cardiac enlargement, hepatic steatosis mild left adrenal nodularity stable compared to chest CT March 2020, small nonobstructing bilateral renal calculi, extensive colonic diverticula CT chest 06/03/2022-6 mm nodule in the superior right lower lobe-new from 07/12/2018, new 1-2 mm nodule within the left upper lobe Elevated CEA 07/24/2022 MRI pelvis 08/06/2022-tumor at 3.5 cm from the anal verge with involvement of the upper anal sphincter, invasion of the inferior right levator ani muscle, T4b, one 5 mm left posterior perirectal node-N 1 CTs 08/19/2022-partially circumferential mass of the  low rectum.  Small bilateral pulmonary nodules slightly enlarged compared to prior examination.  Plan for follow-up CT chest after completing 6 cycles of chemotherapy. Cycle 1 FOLFOX 08/24/2022 Cycle 2 FOLFOX 09/07/2022 Chemotherapy held 09/21/2022 due to severe neutropenia, thrombocytopenia Cycle 3 FOLFOX 10/05/2022, Udenyca , oxaliplatin  dose reduced secondary to thrombocytopenia following cycle 2 Treatment held 10/20/2022 due to thrombocytopenia Cycle 4 FOLFOX 10/27/2022, Udenyca , oxaliplatin  dose reduced secondary to thrombocytopenia following cycle 3 Cycle 5 FOLFOX 11/09/2022, Udenyca  Cycle 6 FOLFOX 11/24/2022, Udenyca  Cycle 7 FOLFOX 12/08/2022, Udenyca  CTs 12/16/2022-similar appearance of circumferential low rectal wall mass; decreased size of scattered small bilateral pulmonary nodules.  No new suspicious pulmonary nodules or masses.  No evidence of metastatic disease in the abdomen or pelvis. Cycle 8 FOLFOX 01/05/2023, 5-FU bolus eliminated, pump dose decreased due to diarrhea Radiation/Xeloda 02/08/2023-03/17/2023 CT chest/abdomen 04/17/2023: Stable pulmonary nodules no evidence of metastatic disease MRI pelvis 04/16/2023: Tumor at 3.5 cm from anal verge, decreased in thickness, decreased extension through the muscularis propria with 1 area of extension measuring 3 mm-potentially retraction scar, abutment of the mesorectal fascia at the 8 o'clock position, previously noted perirectal lymph node is no longer measurable, no new lymphadenopathy Flexible sigmoidoscopy 05/14/2023-small scar found in the rectum.  Scar tissue was healthy in appearance.  No evidence of residual cancer.  Biopsies obtained-colonic mucosa with hyperplastic and reactive changes.  Negative for dysplasia or malignancy. MRI pelvis 08/17/2023: Further reduction in thickness at the low rectum/anal canal, no lymphadenopathy 09/17/2023 flexible sigmoidoscopy-scar in the distal rectum, biopsy shows colonic mucosa with hyperplastic changes,  negative for dysplasia 10/18/2023 CEA 11.45 CTs 10/18/2023-extensive multivessel coronary artery calcification.  No enlarged mediastinal or axillary lymph nodes.  Multiple  randomly distributed nodules have developed within the lungs bilaterally.  Interval enlargement of the dominant nodule within the superior segment right lower lobe now measuring 11 mm   CAD, status post myocardial infarction 2015 and July 2023, D1 branch LAD angioplasty 12/23/2021 Hyperlipidemia Hypertension PTSD BPH Cigar use  Disposition: Ronald [name] appears stable.  Recent CTs show an increase in the size and number of multiple bilateral lung nodules.  Report/images reviewed with Ronald [name] and his wife at today's visit.  They understand the high likelihood the lung nodules represent metastatic rectal cancer.  They further understand if the lung nodules are cancer no therapy will be curative.  We are requesting foundation 1 testing on the rectal biopsy from 07/24/2022.  We are referring him for a staging PET scan.  He will return for follow-up in 2 to 3 weeks for additional discussion/treatment recommendation.  We are available to see him sooner if needed.  Patient seen with Dr. Cloretta.  Sy Saintjean ANP/GNP-BC   10/29/2023  10:07 AM  This was a shared visit with Olam Ned.  We reviewed the restaging CT findings and images with Ronald [name] and his wife.  He has developed new lung nodules and enlargement of a dominant right lung lesion.  The CEA is elevated.  He understands a high likelihood the CT findings are indicative of metastatic rectal cancer.  No therapy will be curative.  We will submit the rectal biopsy tissue for molecular testing.  He will be referred for a staging PET scan.  He will return for an office visit and further discussion after the staging PET scan.  I was present for greater than 50% of today's visit.  I performed Medical Decision Making.  Arvella Cloretta, MD

## 2023-10-29 NOTE — Progress Notes (Unsigned)
 Foundation one testing requested on accession number [id number]

## 2023-11-01 ENCOUNTER — Encounter: Payer: Self-pay | Admitting: Oncology

## 2023-11-03 ENCOUNTER — Ambulatory Visit

## 2023-11-08 ENCOUNTER — Other Ambulatory Visit (HOSPITAL_BASED_OUTPATIENT_CLINIC_OR_DEPARTMENT_OTHER): Payer: Self-pay | Admitting: Internal Medicine

## 2023-11-08 ENCOUNTER — Ambulatory Visit
Admission: RE | Admit: 2023-11-08 | Discharge: 2023-11-08 | Disposition: A | Discharge status: Home / Self Care | Source: Ambulatory Visit | Attending: Nurse Practitioner | Admitting: Nurse Practitioner

## 2023-11-08 DIAGNOSIS — Z85048 Personal history of other malignant neoplasm of rectum, rectosigmoid junction, and anus: Secondary | ICD-10-CM

## 2023-11-08 DIAGNOSIS — R918 Other nonspecific abnormal finding of lung field: Secondary | ICD-10-CM

## 2023-11-08 DIAGNOSIS — C20 Malignant neoplasm of rectum: Secondary | ICD-10-CM

## 2023-11-09 ENCOUNTER — Ambulatory Visit

## 2023-11-10 ENCOUNTER — Ambulatory Visit (HOSPITAL_BASED_OUTPATIENT_CLINIC_OR_DEPARTMENT_OTHER)
Admission: RE | Admit: 2023-11-10 | Discharge: 2023-11-10 | Disposition: A | Discharge status: Home / Self Care | Source: Ambulatory Visit | Attending: Internal Medicine | Admitting: Internal Medicine

## 2023-11-10 DIAGNOSIS — Z85048 Personal history of other malignant neoplasm of rectum, rectosigmoid junction, and anus: Secondary | ICD-10-CM | POA: Diagnosis present

## 2023-11-11 ENCOUNTER — Encounter (HOSPITAL_COMMUNITY): Payer: Self-pay

## 2023-11-15 ENCOUNTER — Ambulatory Visit
Admission: RE | Admit: 2023-11-15 | Discharge: 2023-11-15 | Disposition: A | Discharge status: Home / Self Care | Source: Ambulatory Visit | Attending: Nurse Practitioner | Admitting: Nurse Practitioner

## 2023-11-15 DIAGNOSIS — R918 Other nonspecific abnormal finding of lung field: Secondary | ICD-10-CM | POA: Diagnosis not present

## 2023-11-15 DIAGNOSIS — Z85038 Personal history of other malignant neoplasm of large intestine: Secondary | ICD-10-CM | POA: Insufficient documentation

## 2023-11-15 DIAGNOSIS — C20 Malignant neoplasm of rectum: Secondary | ICD-10-CM | POA: Diagnosis present

## 2023-11-15 LAB — GLUCOSE, CAPILLARY: Glucose-Capillary: 119 mg/dL — ABNORMAL HIGH (ref 70–99)

## 2023-11-15 MED ORDER — FLUDEOXYGLUCOSE F - 18 (FDG) INJECTION
9.3900 | Freq: Once | INTRAVENOUS | Status: AC | PRN
  Administered 2023-11-15: 9.39 via INTRAVENOUS

## 2023-11-16 ENCOUNTER — Inpatient Hospital Stay: Attending: Nurse Practitioner | Admitting: Oncology

## 2023-11-16 VITALS — BP 110/60 | HR 78 | Temp 98.6°F | Resp 15 | Ht 68.0 in | Wt 170.1 lb

## 2023-11-16 DIAGNOSIS — N400 Enlarged prostate without lower urinary tract symptoms: Secondary | ICD-10-CM | POA: Insufficient documentation

## 2023-11-16 DIAGNOSIS — I10 Essential (primary) hypertension: Secondary | ICD-10-CM | POA: Insufficient documentation

## 2023-11-16 DIAGNOSIS — I2510 Atherosclerotic heart disease of native coronary artery without angina pectoris: Secondary | ICD-10-CM | POA: Diagnosis not present

## 2023-11-16 DIAGNOSIS — F4310 Post-traumatic stress disorder, unspecified: Secondary | ICD-10-CM | POA: Insufficient documentation

## 2023-11-16 DIAGNOSIS — C20 Malignant neoplasm of rectum: Secondary | ICD-10-CM | POA: Insufficient documentation

## 2023-11-16 DIAGNOSIS — E785 Hyperlipidemia, unspecified: Secondary | ICD-10-CM | POA: Diagnosis not present

## 2023-11-16 DIAGNOSIS — I252 Old myocardial infarction: Secondary | ICD-10-CM | POA: Insufficient documentation

## 2023-11-16 DIAGNOSIS — Z720 Tobacco use: Secondary | ICD-10-CM | POA: Insufficient documentation

## 2023-11-16 DIAGNOSIS — M25559 Pain in unspecified hip: Secondary | ICD-10-CM | POA: Insufficient documentation

## 2023-11-16 NOTE — Progress Notes (Signed)
 [facility] OFFICE PROGRESS NOTE   Diagnosis: Rectal cancer  INTERVAL HISTORY:   Ronald [name] turns as scheduled.  He generally feels well.  He continues to have pain at the hip .  He is scheduled to see rehabilitation medicine tomorrow.  He reports the discomfort has been present since he was treated with radiation last fall.  The discomfort limits his ability to ambulate.  No difficulty with bowel function.  No bleeding.  Objective:  Vital signs in last 24 hours:  Blood pressure 110/60, pulse 78, temperature 98.6 F (37 C), temperature source Temporal, resp. rate 15, height 5' 8 (1.727 m), weight 170 lb 1.6 oz (77.2 kg), SpO2 96%.    Lymphatics: No cervical, supraclavicular, axillary, or inguinal nodes Resp: Lungs clear bilaterally Cardio: Regular rate and rhythm GI: No hepatosplenomegaly, nontender Vascular: No leg edema Musculoskeletal: The area of discomfort is at the left lateral iliac/upper gluteal region, mild tenderness, no mass.  No pain with motion of the left hip   Portacath/PICC-without erythema  Lab Results:  Lab Results  Component Value Date   WBC 4.3 05/25/2023   HGB 11.9 (L) 05/25/2023   HCT 33.6 (L) 05/25/2023   MCV 95.5 05/25/2023   PLT 147 (L) 05/25/2023   NEUTROABS 2.7 05/25/2023    CMP  Lab Results  Component Value Date   NA 136 10/18/2023   K 3.6 10/18/2023   CL 100 10/18/2023   CO2 22 10/18/2023   GLUCOSE 164 (H) 10/18/2023   BUN 12 10/18/2023   CREATININE 0.94 10/18/2023   CALCIUM  9.1 10/18/2023   PROT 6.2 (L) 05/25/2023   ALBUMIN 4.2 05/25/2023   AST 18 05/25/2023   ALT 18 05/25/2023   ALKPHOS 65 05/25/2023   BILITOT 0.6 05/25/2023   GFRNONAA >60 10/18/2023   GFRAA 84 02/10/2019    Lab Results  Component Value Date   CEA1 23.9 (H) 07/24/2022   CEA 11.45 (H) 10/18/2023    Lab Results  Component Value Date   INR 0.93 01/14/2014   LABPROT 12.5 01/14/2014    Imaging:  NM PET Image Initial (PI) Skull  Base To Thigh Result Date: 11/16/2023 CLINICAL DATA:  Subsequent treatment strategy for rectal cancer. Lung nodules. EXAM: NUCLEAR MEDICINE PET SKULL BASE TO THIGH TECHNIQUE: 9.39 mCi F-18 FDG was injected intravenously. Full-ring PET imaging was performed from the skull base to thigh after the radiotracer. CT data was obtained and used for attenuation correction and anatomic localization. Fasting blood glucose: 119 mg/dl COMPARISON:  CT chest abdomen 10/18/2023.  Pelvic MRI 08/17/2023. FINDINGS: Mediastinal blood pool activity: SUV max 2.2 Liver activity: SUV max 2.6 NECK: No specific abnormal uptake identified in the neck including along lymph node change of the submandibular, posterior triangle and internal jugular region. Near symmetric uptake of the intracranial compartment. Incidental CT findings: The parotid glands, submandibular glands and thyroid  glands unremarkable. Scattered vascular calcifications. Mastoid air cells are clear. Mild mucosal thickening along the inferior aspect of the right maxillary sinus and along the left frontal paranasal sinus. CHEST: No specific abnormal radiotracer uptake above blood pool in the axillary regions, hilum or mediastinum. There are scattered bilateral lung nodules identified which have abnormal uptake. Example in the superior segment of the right lower lobe has a focus with maximum SUV of 3.3 and corresponds to the nodule seen previously in this location measuring 11 mm. Another example left upper lobe with area of uptake approaching maximum SUV of 2.4 but this is a small nodule measuring  5 mm. There are up to 5 nodules which show uptake of the multiple nodule seen on the recent examination. Based on appearance and history these are very worrisome for lung metastases. Incidental CT findings: Right IJ chest port in place with tip of the catheter extending to the SVC right atrial junction region. Heart is nonenlarged. Scattered vascular calcifications including the aorta  and coronary vessels. Aberrant right subclavian artery. No consolidation, pneumothorax or effusion. ABDOMEN/PELVIS: There is physiologic distribution radiotracer along the parenchymal organs, bowel and renal collecting systems. Only trace uptake along the anorectal region. Please correlate clinical history. No areas of abnormal nodal uptake identified in the abdomen and pelvis. Incidental CT findings: Stones in the gallbladder. Bilateral nonobstructing renal stones. Trabeculated wall of the urinary bladder with adjacent stranding. Please correlate with history. Benign left-sided renal cyst. Prominent prostate. Small fat containing inguinal hernias. Bowel is nondilated. Scattered colonic stool. Diffuse colonic diverticula greatest of the sigmoid region. Stomach is underdistended. SKELETON: No areas of abnormal uptake along visualized osseous structures. Incidental CT findings: Slight curvature of the spine. Scattered degenerative changes. IMPRESSION: Multiple lung nodules are again seen as on prior examination. Several the show some level of abnormal uptake in the worrisome for lung metastases. No additional soft tissue areas of abnormal uptake at this time. Electronically Signed   By: Ranell Bring M.D.   On: 11/16/2023 09:53    Medications: I have reviewed the patient's current medications.   Assessment/Plan: Rectal cancer Colonoscopy 07/24/2022-mass in the posterior rectum extending from the dentate line for 3 cm, biopsy-moderately differentiated invasive adenocarcinoma, mismatch repair protein expression intact, microsatellite stable, Foundation 1-tumor mutation burden 14, KRAS G 12D, HRD signature negative CT abdomen/pelvis 04/07/2022-cardiac enlargement, hepatic steatosis mild left adrenal nodularity stable compared to chest CT March 2020, small nonobstructing bilateral renal calculi, extensive colonic diverticula CT chest 06/03/2022-6 mm nodule in the superior right lower lobe-new from 07/12/2018, new 1-2  mm nodule within the left upper lobe Elevated CEA 07/24/2022 MRI pelvis 08/06/2022-tumor at 3.5 cm from the anal verge with involvement of the upper anal sphincter, invasion of the inferior right levator ani muscle, T4b, one 5 mm left posterior perirectal node-N 1 CTs 08/19/2022-partially circumferential mass of the low rectum.  Small bilateral pulmonary nodules slightly enlarged compared to prior examination.  Plan for follow-up CT chest after completing 6 cycles of chemotherapy. Cycle 1 FOLFOX 08/24/2022 Cycle 2 FOLFOX 09/07/2022 Chemotherapy held 09/21/2022 due to severe neutropenia, thrombocytopenia Cycle 3 FOLFOX 10/05/2022, Udenyca , oxaliplatin  dose reduced secondary to thrombocytopenia following cycle 2 Treatment held 10/20/2022 due to thrombocytopenia Cycle 4 FOLFOX 10/27/2022, Udenyca , oxaliplatin  dose reduced secondary to thrombocytopenia following cycle 3 Cycle 5 FOLFOX 11/09/2022, Udenyca  Cycle 6 FOLFOX 11/24/2022, Udenyca  Cycle 7 FOLFOX 12/08/2022, Udenyca  CTs 12/16/2022-similar appearance of circumferential low rectal wall mass; decreased size of scattered small bilateral pulmonary nodules.  No new suspicious pulmonary nodules or masses.  No evidence of metastatic disease in the abdomen or pelvis. Cycle 8 FOLFOX 01/05/2023, 5-FU bolus eliminated, pump dose decreased due to diarrhea Radiation/Xeloda 02/08/2023-03/17/2023 CT chest/abdomen 04/17/2023: Stable pulmonary nodules no evidence of metastatic disease MRI pelvis 04/16/2023: Tumor at 3.5 cm from anal verge, decreased in thickness, decreased extension through the muscularis propria with 1 area of extension measuring 3 mm-potentially retraction scar, abutment of the mesorectal fascia at the 8 o'clock position, previously noted perirectal lymph node is no longer measurable, no new lymphadenopathy Flexible sigmoidoscopy 05/14/2023-small scar found in the rectum.  Scar tissue was healthy in appearance.  No  evidence of residual cancer.  Biopsies  obtained-colonic mucosa with hyperplastic and reactive changes.  Negative for dysplasia or malignancy. MRI pelvis 08/17/2023: Further reduction in thickness at the low rectum/anal canal, no lymphadenopathy 09/17/2023 flexible sigmoidoscopy-scar in the distal rectum, biopsy shows colonic mucosa with hyperplastic changes, negative for dysplasia 10/18/2023 CEA 11.45 CTs 10/18/2023-extensive multivessel coronary artery calcification.  No enlarged mediastinal or axillary lymph nodes.  Multiple randomly distributed nodules have developed within the lungs bilaterally.  Interval enlargement of the dominant nodule within the superior segment right lower lobe now measuring 11 mm PET 11/15/2023: Multiple lung nodules with increased metabolic activity, no other evidence of metastatic disease   CAD, status post myocardial infarction 2015 and July 2023, D1 branch LAD angioplasty 12/23/2021 Hyperlipidemia Hypertension PTSD BPH Cigar use   Disposition: Ronald [name] has rectal cancer.  We reviewed the staging PET findings and images.  He has multiple lung nodules, some with increased metabolic activity above background.  He understands a high likelihood of metastatic rectal cancer given the CT/PET findings, clinical history, and elevated CEA.  It would be difficult to biopsy a lung nodule, but this is possible with a navigation bronchoscopy.  I do not recommend a biopsy.  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  No therapy will be curative.  We discussed systemic treatment options.  We specifically discussed FOLFIRI/bevacizumab.  We reviewed results of NGS testing.  The tumor has a K-ras mutation so he will not be a candidate for an EGFR inhibitor.  We can consider immunotherapy as a salvage treatment given the high tumor mutation burden.  Ronald Leser versus observation for now.  He will return for an office visit and repeat chest CT in approximately 2 months.  The plan is to initiate systemic therapy if  he develops symptomatic or significant radiologic evidence of disease progression.  The etiology of the left pelvic discomfort is unclear.  The pain does not appear to be related to his left hip joint.  I suspect the pain is due to a benign musculoskeletal condition.  I reviewed the PET images and there is no evidence for metastatic disease in the left pelvis.  It is possible the pain is radicular from the lumbosacral spine.  He is scheduled to see rehabilitation medicine tomorrow.    Arley Hof, MD  11/16/2023  1:56 PM

## 2023-11-17 ENCOUNTER — Encounter: Attending: Physical Medicine and Rehabilitation | Admitting: Physical Medicine and Rehabilitation

## 2023-11-17 ENCOUNTER — Inpatient Hospital Stay: Admitting: Oncology

## 2023-11-17 VITALS — BP 109/61 | HR 77 | Ht 68.0 in | Wt 169.0 lb

## 2023-11-17 DIAGNOSIS — M25552 Pain in left hip: Secondary | ICD-10-CM | POA: Insufficient documentation

## 2023-11-17 DIAGNOSIS — M6289 Other specified disorders of muscle: Secondary | ICD-10-CM | POA: Diagnosis not present

## 2023-11-17 NOTE — Progress Notes (Signed)
 Subjective:    Patient ID: Ronald [name], male    DOB: October 04, 1945, 78 y.o.   MRN: [id number]  HPI  HPI  Ronald [name] is a 78 y.o. year old male  who  has a past medical history of Bronchitis, Cancer (HCC), History of kidney stones, Hyperlipidemia, Hypertension, and Myocardial infarction (HCC).   They are presenting to PM&R clinic as a new patient for pain management evaluation. They were referred by Dr. Carlin Hamburg for treatment of chronic L hip pain.   Per Dr. Deanne yesterday: The etiology of the left pelvic discomfort is unclear. The pain does not appear to be related to his left hip joint. I suspect the pain is due to a benign musculoskeletal condition. I reviewed the PET images and there is no evidence for metastatic disease in the left pelvis. It is possible the pain is radicular from the lumbosacral spine. He is scheduled to see rehabilitation medicine tomorrow.   Xray L hip 5.6.25: Ngeative for frature or dislocation. Mild degenerative changes at the left hip. Moderate lower lumbar degenerative changes.  Possible vague luscent lesion at the femoral head measuring 1.5 cm. --Physician reported this the patient is a possible tear  CT L hip 10/2023: IMPRESSION: 1. No acute osseous abnormality of the left hip. 2. Mild osteoarthritis of the left hip. 3. 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 4. 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  Source: Left posterior hip down the back of his leg to his calf, does not extend into his toes  Inciting incident: I was going through radiation in October-November, and it started about that time.   Description of pain: intermittent and just hurts Exacerbating factors: twisting, bending forward, standing, and activity Remitting factors: nothing Red flag symptoms: No red flags for back pain endorsed in Hx or ROS and Hx cancer rectal -  follows with Dr. Deanne  Medications tried: Topical medications (no effect) : IcyHot - without relief Nsaids (unsure of effect) :  Tylenol   (no effect) : Typenol PM at nighttime - mostly for sleep.  Opiates  (never tried) :  Gabapentin / Lyrica  (never tried) :  TCAs  (never tried) :  SNRIs  ( unsure of effect ) : Does not know if or why he is on it.  Other  (moderate effect) : Had a steroid dosepak with Dr. Hamburg; helped a lot   Other treatments: PT/OT  () : None recent, 20+ years ago Accupuncture/chiropractor/massage  (never tried) :  TENs unit (never tried) :  Injections (never tried) :  Surgery (never tried) :  Other  (never tried) :   Goals for pain control: [place] - I bought it so I would have plenty to do. I don't have issues on the mower but I can't weedeat or anything.   Prior UDS results: No results found for: LABOPIA, COCAINSCRNUR, LABBENZ, AMPHETMU, THCU, LABBARB    Pain Inventory Average Pain 5 Pain Right Now 5 My pain is sharp, dull, and aching  In the last 24 hours, has pain interfered with the following? General activity 5 Relation with others 5 Enjoyment of life 5 What TIME of day is your pain at its worst? varies Sleep (in general) Fair  Pain is worse with: walking, bending, sitting, and standing Pain improves with: medication Relief from Meds: 5  walk without assistance how many minutes can you walk?  unsure ability to climb steps?  yes do you drive?  yes  retired  trouble walking  New pt  New pt    No family history on file. 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   Social Drivers of Health   Financial Resource Strain: Low Risk  (08/06/2022)   Overall Financial Resource Strain (CARDIA)    Difficulty of Paying Living Expenses: Not hard at all  Food Insecurity: No Food Insecurity (09/15/2022)   Hunger Vital Sign    Worried About Running Out of Food in the Last Year: Never true    Ran Out of Food in the Last Year: Never true  Transportation Needs: No Transportation Needs (09/15/2022)   PRAPARE - Administrator, Civil Service (Medical): No    Lack of Transportation (Non-Medical): No  Physical Activity: Not on file  Stress: No Stress Concern Present (08/06/2022)   [employer] of Occupational Health - Occupational Stress Questionnaire    Feeling of Stress : Not at all  Social Connections: Not on file   Past Surgical History:  Procedure Laterality Date   BIOPSY  07/24/2022   Procedure: BIOPSY;  Surgeon: Rollin Dover, MD;  Location: [facility];  Service: Gastroenterology;;   BIOPSY  05/14/2023   Procedure: BIOPSY;  Surgeon: Rollin Dover, MD;  Location: [facility];  Service: Gastroenterology;;   BIOPSY OF SKIN SUBCUTANEOUS TISSUE AND/OR MUCOUS MEMBRANE  09/17/2023   Procedure: BIOPSY, SKIN, SUBCUTANEOUS TISSUE, OR MUCOUS MEMBRANE;  Surgeon: Rollin Dover, MD;  Location: [facility];  Service: Gastroenterology;;   COLONOSCOPY WITH PROPOFOL  N/A 07/24/2022   Procedure: COLONOSCOPY WITH PROPOFOL ;  Surgeon: Rollin Dover, MD;  Location: [facility];  Service: Gastroenterology;  Laterality: N/A;   CORONARY STENT INTERVENTION N/A 12/23/2021   Procedure: CORONARY STENT INTERVENTION;  Surgeon: Ladona Heinz, MD;  Location: [facility];  Service: Cardiovascular;  Laterality: N/A;   FLEXIBLE SIGMOIDOSCOPY N/A 05/14/2023    Procedure: FLEXIBLE SIGMOIDOSCOPY;  Surgeon: Rollin Dover, MD;  Location: [facility];  Service: Gastroenterology;  Laterality: N/A;   FLEXIBLE SIGMOIDOSCOPY N/A 09/17/2023   Procedure: KINGSTON SIDE;  Surgeon: Rollin Dover, MD;  Location: [facility];  Service: Gastroenterology;  Laterality: N/A;   LEFT HEART CATH AND CORONARY ANGIOGRAPHY N/A 11/19/2021   Procedure: LEFT HEART CATH AND CORONARY ANGIOGRAPHY;  Surgeon: Elmira Newman PARAS, MD;  Location: [facility];  Service: Cardiovascular;  Laterality: N/A;   LEFT HEART CATHETERIZATION WITH CORONARY ANGIOGRAM N/A 01/15/2014   Procedure: LEFT HEART CATHETERIZATION WITH CORONARY ANGIOGRAM;  Surgeon: Ozell JONETTA Fell, MD;  Location: [facility];  Service: Cardiovascular;  Laterality: N/A;   PORTACATH PLACEMENT N/A 08/18/2022   Procedure: INSERTION PORT-A-CATH;  Surgeon: Teresa Lonni HERO, MD;  Location: [facility];  Service: General;  Laterality: N/A;  60   Past  Medical History:  Diagnosis Date   Bronchitis    Cancer (HCC)    colon   History of kidney stones    Hyperlipidemia    Hypertension    Myocardial infarction (HCC)    BP 109/61 (BP Location: Left Arm, Patient Position: Sitting, Cuff Size: Normal)   Pulse 77   Ht 5' 8 (1.727 m)   Wt 169 lb (76.7 kg)   SpO2 90%   BMI 25.70 kg/m   Opioid Risk Score:   Fall Risk Score:  `1  Depression screen PHQ 2/9     11/17/2023    2:50 PM 10/29/2023   10:00 AM 09/15/2022    9:35 AM 08/05/2022    1:48 PM  Depression screen PHQ 2/9  Decreased Interest 0 0 0 0  Down, Depressed, Hopeless 0 0 0 0  PHQ - 2 Score 0 0 0 0  Altered sleeping 3     Tired, decreased energy 2     Change in appetite 0     Feeling bad or failure about yourself  0     Trouble concentrating 0     Moving slowly or fidgety/restless 1     Suicidal thoughts 0     PHQ-9 Score 6     Difficult doing work/chores Somewhat difficult        Review of Systems  Musculoskeletal:  Positive for gait problem.        Left hip pain, sometimes goes down the back of left leg to calf  All other systems reviewed and are negative.      Objective:   Physical Exam    PE: Constitution: Appropriate appearance for age. No apparent distress  Resp: No respiratory distress. No accessory muscle usage.  Cardio: Well perfused appearance.  No peripheral edema. Abdomen: Nondistended. Nontender.   Psych: Appropriate mood and affect. Neuro: AAOx4. No apparent cognitive deficits   Neurologic Exam:   DTRs: Reflexes were 2+ in bilateral achilles, patella, biceps, BR and triceps. Babinsky: flexor responses b/l.   Hoffmans: negative b/l Sensory exam: revealed normal sensation in all dermatomal regions in bilateral upper extremities and bilateral lower extremities Motor exam: strength 5/5 throughout bilateral upper extremities and bilateral lower extremities Coordination: Fine motor coordination was normal.   Gait: normal    Back Exam:   Inspection: Pelvis was even.  Lumbar lordotic curvature was  wnl .  + mild dextroscoliosis.  Palpation: Palpatory exam revealed ttp at the L ischial tuberosity and posterior wing of the ischium; none on GTB, none on PSIS or SI joint, none on parapsinals . There was no evidence of spasm.   Special/provocative testing:    SLR: -   Slump test: -    Facet loading: -(very non-specific)   TTP at paraspinals: -(sensitive for facet pain...if no ttp then likely not facet pain)   Deri test: + L posterior hip pain, not SI joint   FAIR test: ++ L posterior hip pain just proximal to ischial tuberosity   Gaenslen test: -   Yeoman's test: GLENWOOD Handsome -    Saylor Test: +   Information in () parenthesis is normals/details of specific exam.      Assessment & Plan:  Ronald [name] is a 78 y.o. year old male  who  has a past medical history of Bronchitis, Cancer (HCC), History of kidney stones, Hyperlipidemia, Hypertension, and Myocardial infarction (HCC).   They are presenting to  PM&R clinic as a new patient for treatment  of left hip pain.SABRA   Posterior pain of left hip Hamstring tightness of left lower extremity -     Ambulatory referral to Physical Therapy Pain seems to be located around left ischial tuberosity; may represent bursitis,  hamstring tendinitis, or piriformis syndrome with symptoms close extending down the posterior leg/calf.  Differential does include radiation therapy related neuropathy, since this started around the same time as his treatments last October.  Go to PT for your L hip for possible piriformis syndrome  Xray L hip 09/07/23: Mild degenerative changes at the left hip. Moderate lower lumbar degenerative changes. Possible vague luscent lesion at the femoral head measuring 1.5 cm.   CT L hip 10/2023: IMPRESSION: 1. No acute osseous abnormality of the left hip. 2. Mild osteoarthritis of the left hip. 3. 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 4. 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  Use voltaren gel over the counter up to 4 times daily and heat 20 minutes up to 4 times daily on left posterior hip.  Follow up in 6-8 weeks.    If no improvement, will consider MRI low back and EMG BL LE for sciatica vs. Radiation neuropathy

## 2023-11-17 NOTE — Patient Instructions (Signed)
 Use voltaren gel over the counter up to 4 times daily and heat 20 minutes up to 4 times daily on left posterior hip.  Request imaging records from Dr. Shelda office; if a tear or tendonitis is shown, we may send you to orthopedics for a injection.   Go to PT for your L hip for possible piriformis syndrome  Follow up in 6-8 weeks.

## 2023-11-18 ENCOUNTER — Other Ambulatory Visit: Payer: Self-pay

## 2023-11-29 ENCOUNTER — Inpatient Hospital Stay

## 2023-11-29 ENCOUNTER — Inpatient Hospital Stay (HOSPITAL_BASED_OUTPATIENT_CLINIC_OR_DEPARTMENT_OTHER): Admitting: Oncology

## 2023-11-29 VITALS — BP 118/59 | HR 88 | Temp 98.2°F | Resp 18 | Ht 68.0 in | Wt 168.6 lb

## 2023-11-29 DIAGNOSIS — C20 Malignant neoplasm of rectum: Secondary | ICD-10-CM

## 2023-11-29 DIAGNOSIS — Z95828 Presence of other vascular implants and grafts: Secondary | ICD-10-CM

## 2023-11-29 MED ORDER — HEPARIN SOD (PORK) LOCK FLUSH 100 UNIT/ML IV SOLN
500.0000 [IU] | Freq: Once | INTRAVENOUS | Status: AC
  Administered 2023-11-29: 500 [IU] via INTRAVENOUS

## 2023-11-29 MED ORDER — SODIUM CHLORIDE 0.9% FLUSH
10.0000 mL | Freq: Once | INTRAVENOUS | Status: AC
  Administered 2023-11-29: 10 mL via INTRAVENOUS

## 2023-11-29 NOTE — Patient Instructions (Signed)

## 2023-11-29 NOTE — Progress Notes (Signed)
 Patient here due to scheduling error, not seen

## 2023-12-10 ENCOUNTER — Ambulatory Visit: Attending: Physical Medicine and Rehabilitation | Admitting: Physical Therapy

## 2023-12-10 ENCOUNTER — Encounter: Payer: Self-pay | Admitting: Physical Therapy

## 2023-12-10 ENCOUNTER — Other Ambulatory Visit: Payer: Self-pay

## 2023-12-10 DIAGNOSIS — M6289 Other specified disorders of muscle: Secondary | ICD-10-CM | POA: Insufficient documentation

## 2023-12-10 DIAGNOSIS — M25552 Pain in left hip: Secondary | ICD-10-CM | POA: Insufficient documentation

## 2023-12-10 NOTE — Therapy (Signed)
 OUTPATIENT PHYSICAL THERAPY LOWER EXTREMITY EVALUATION   Patient Name: Ronald [name] MRN: [id number] DOB:[birth year], 78 y.o., male Today's Date: 12/10/2023  END OF SESSION:  PT End of Session - 12/10/23 1043     Visit Number 1    Number of Visits 7    Date for PT Re-Evaluation 01/21/24    Authorization Type [place]    Authorization Time Period dates 09/09/23 - 05/15/24  approved 15 visits    Authorization - Visit Number 1    Authorization - Number of Visits 15    PT Start Time 1048    PT Stop Time 1130    PT Time Calculation (min) 42 min          Past Medical History:  Diagnosis Date   Bronchitis    Cancer (HCC)    colon   History of kidney stones    Hyperlipidemia    Hypertension    Myocardial infarction [facility])    Past Surgical History:  Procedure Laterality Date   BIOPSY  07/24/2022   Procedure: BIOPSY;  Surgeon: Rollin Dover, MD;  Location: [facility];  Service: Gastroenterology;;   BIOPSY  05/14/2023   Procedure: BIOPSY;  Surgeon: Rollin Dover, MD;  Location: [facility];  Service: Gastroenterology;;   BIOPSY OF SKIN SUBCUTANEOUS TISSUE AND/OR MUCOUS MEMBRANE  09/17/2023   Procedure: BIOPSY, SKIN, SUBCUTANEOUS TISSUE, OR MUCOUS MEMBRANE;  Surgeon: Rollin Dover, MD;  Location: [facility];  Service: Gastroenterology;;   COLONOSCOPY WITH PROPOFOL  N/A 07/24/2022   Procedure: COLONOSCOPY WITH PROPOFOL ;  Surgeon: Rollin Dover, MD;  Location: [facility];  Service: Gastroenterology;  Laterality: N/A;   CORONARY STENT INTERVENTION N/A 12/23/2021   Procedure: CORONARY STENT INTERVENTION;  Surgeon: Ladona Heinz, MD;  Location: [facility];  Service: Cardiovascular;  Laterality: N/A;   FLEXIBLE SIGMOIDOSCOPY N/A 05/14/2023   Procedure: FLEXIBLE SIGMOIDOSCOPY;  Surgeon: Rollin Dover, MD;  Location: [facility];  Service: Gastroenterology;  Laterality: N/A;   FLEXIBLE SIGMOIDOSCOPY N/A 09/17/2023   Procedure: KINGSTON SIDE;  Surgeon: Rollin Dover, MD;  Location:  [facility];  Service: Gastroenterology;  Laterality: N/A;   LEFT HEART CATH AND CORONARY ANGIOGRAPHY N/A 11/19/2021   Procedure: LEFT HEART CATH AND CORONARY ANGIOGRAPHY;  Surgeon: Elmira Newman PARAS, MD;  Location: [facility];  Service: Cardiovascular;  Laterality: N/A;   LEFT HEART CATHETERIZATION WITH CORONARY ANGIOGRAM N/A 01/15/2014   Procedure: LEFT HEART CATHETERIZATION WITH CORONARY ANGIOGRAM;  Surgeon: Ozell JONETTA Fell, MD;  Location: [facility] C[facility];  Service: Cardiovascular;  Laterality: N/A;   PORTACATH PLACEMENT N/A 08/18/2022   Procedure: INSERTION PORT-A-CATH;  Surgeon: Teresa Lonni HERO, MD;  Location: [facility];  Service: General;  Laterality: N/A;  60   Patient Active Problem List   Diagnosis Date Noted   Posterior pain of left hip 11/17/2023   Hamstring tightness of left lower extremity 11/17/2023   Rectal cancer (HCC) 08/12/2022   Angina pectoris (HCC)    Coronary artery disease involving native coronary artery of native heart with unstable angina pectoris (HCC) 04/18/2021   Asymptomatic bilateral carotid artery stenosis 04/18/2021   Mixed hyperlipidemia 04/18/2021   Tobacco abuse 01/16/2014   NSTEMI (non-ST elevated myocardial infarction) (HCC) 01/14/2014   Essential hypertension 01/14/2014    PCP: Virgia Dorothe BRAVO, MD  REFERRING PROVIDER: Emeline Joesph BROCKS, DO  REFERRING DIAG: [phone] (ICD-10-CM) - Posterior pain of left hip M62.89 (ICD-10-CM) - Hamstring tightness of left lower extremity  THERAPY DIAG:  Pain in left hip  Rationale for Evaluation and  Treatment: Rehabilitation  ONSET DATE: Fall 2024  SUBJECTIVE:   SUBJECTIVE STATEMENT: Pt endorses history of back pain in the past but no prior hip issues. He reports onset around the time of receiving radiation for colon cancer in autumn 2024, denies any other MOI or change in activity. States he was placed on steroids and this helped, but pain came back after cessation. He states initially symptoms would  go down into leg/ankle (posteriorly, described as sharp pain), but now localized to his lateral/posterior hip. He states he got a CT and MRI through the [place] which showed a lesion or a tear in this area. He states he is limiting his walking a bit but otherwise no overt limitations. He has difficulty describing any clear provocative factors, states it is random but will occur about 2-3x/day on avg. Denies any N/T, weakness, buckling, or R sided symptoms.    PERTINENT HISTORY: cancer, HTN, MI, portacath (RUE) Pt also self reports hernia, stable and nonpainful Had radiation and chemo for colon cancer, states is in remission and follows w/ oncology every three months  PAIN:  Are you having pain: 1/10 Location/description: lateral hip, L side, sharp  Best-worst over past week: 0-, just about bring tears to your eyes  - aggravating factors: walking, otherwise cannot recall consistent factors  - Easing factors: elevating LLE, steroids, pain cream    PRECAUTIONS: cancer, port a cath RUE, cardiac hx  RED FLAGS: None   WEIGHT BEARING RESTRICTIONS: No  FALLS:  Has patient fallen in last 6 months? No  LIVING ENVIRONMENT: Lives 3 story home, sometimes will have trouble with stairs Lives w/ wife, housework split, yardwork   OCCUPATION: retired - Lobbyist, heating/AC, draft beer  PLOF: Independent  PATIENT GOALS: reduce pain  NEXT MD VISIT: 12/29/23 Dr. Emeline   OBJECTIVE:  Note: Objective measures were completed at Evaluation unless otherwise noted.  DIAGNOSTIC FINDINGS:  11/15/23 PET scan: IMPRESSION: Multiple lung nodules are again seen as on prior examination. Several the show some level of abnormal uptake in the worrisome for lung metastases.   No additional soft tissue areas of abnormal uptake at this time.  11/10/23 CT L hip: IMPRESSION: 1. No acute osseous abnormality of the left hip. 2. Mild osteoarthritis of the left hip. 3. Mild diffuse rectal wall thickening  compatible with known malignancy. 4. 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 Passive limitations into IR BIL, LLE concordant.   LOWER EXTREMITY MMT:    MMT Right eval Left eval  Hip flexion 5 5  Hip abduction (modified sitting) 5 5  Hip internal rotation 4+ 4+*  Hip external rotation 4+ 4+  Knee flexion 5 5  Knee extension 5 5  Ankle dorsiflexion 5 5   (  Blank rows = not tested) (Key: WFL = within functional limits not formally assessed, * = concordant pain, s = stiffness/stretching sensation, NT = not tested)  Comments:    LOWER EXTREMITY SPECIAL TESTS:  Positive FABER and FADDIR LLE compared to R, no change w/ short axis distraction Hamstring tightness BIL, symmetrical and painless  FUNCTIONAL TESTS:  5xSTS: 15.25 sec no UE support, infrequent weight shift to R, nonpainful   GAIT: Distance walked: within clinic Assistive device utilized: None Level of assistance: Complete Independence Comments: reduced trunk rotation, reduced hip extension BIL                                                                                                                               TREATMENT DATE:  [facility] PT Treatment:                                                 DATE: 12/10/23 Therapeutic Exercise: Seated hip adduction iso practice reps cues for breath control and posture STS practice cues for pacing and symmetry of WB Seated green band hip abduction practice reps cues for posture HEP handout + education, education on relevant anatomy/physiology and rationale for interventions    PATIENT EDUCATION:  Education details: Pt education on PT impairments, prognosis, and POC. Informed consent. Rationale for interventions, safe/appropriate HEP performance Person educated: Patient Education method: Explanation, Demonstration, Tactile cues, Verbal cues Education comprehension: verbalized understanding, returned demonstration, verbal cues required, tactile cues required, and needs further education    HOME EXERCISE PROGRAM: Access Code: [email or web address] URL: [email or web address] Date: 12/10/2023 Prepared by: Alm Jenny  Exercises - Sit to Stand with Armchair  - 2-3 x daily - 1 sets - 5-8 reps - Seated Hip Adduction Isometrics with Ball  - 2-3 x daily - 1 sets - 8 reps - Seated Hip Abduction with Resistance  - 2-3 x daily - 1 sets - 8-10 reps  ASSESSMENT:  CLINICAL IMPRESSION: Patient is a pleasant 78 y.o. gentleman who was seen today for physical therapy evaluation and treatment for L hip pain ongoing since October/November 2024. Describes pain as random but intense, occurring sporadically but ~2-3x/day on avg without clear provocative factors. On exam however he demonstrates consistent and concordant provocation with L hip rotational movements, especially into IR. Some hamstring tightness is noted but symmetrical and not concordant. Occasional asymmetry is noted with transfer mechanics during 5xSTS. At this point recommend trial of skilled PT to address relevant impairments w/ aim of reducing frequency/intensity of pain, although if no significant change over next few visits would recommend return to MD for  further assessment given lack of clear provocative factors in occurrence outside of clinic. No adverse events, pt tolerates exam/HEP well without increase in pain, verbalizes agreement/understanding with plan at this time. Pt departs today's session in no  acute distress, all voiced questions/concerns addressed appropriately from PT perspective.      OBJECTIVE IMPAIRMENTS: Abnormal gait, decreased mobility, decreased ROM, decreased strength, improper body mechanics, and pain.   ACTIVITY LIMITATIONS: stairs, transfers, and locomotion level  PARTICIPATION LIMITATIONS: denies overt participation limitations  PERSONAL FACTORS: Age, Time since onset of injury/illness/exacerbation, and 3+ comorbidities: cancer, HTN, MI, portacath are also affecting patient's functional outcome.   REHAB POTENTIAL: Fair given comorbidities, chronicity, and lack of clear provocative factors in daily occurrence  CLINICAL DECISION MAKING: Evolving/moderate complexity  EVALUATION COMPLEXITY: Moderate   GOALS:   SHORT TERM GOALS: Target date: 12/31/2023  Pt will demonstrate appropriate understanding and performance of initially prescribed HEP in order to facilitate improved independence with management of symptoms.  Baseline: HEP established  Goal status: INITIAL   2. Pt will report at least 25% improvement in overall pain levels over past week in order to facilitate improved tolerance to typical daily activities.   Baseline: 2-3x/daily, painful enough to bring tears to eyes  Goal status: INITIAL    LONG TERM GOALS: Target date: 01/21/2024  Pt will score 60 or greater on LEFS in order to demonstrate improved perception of function due to symptoms (MCID 9 pts) Baseline: 47/80 Goal status: INITIAL  2.  Pt will report at least 50% decrease in overall pain levels in past week in order to facilitate improved tolerance to basic ADLs/mobility.   Baseline: 2-3x/daily, painful enough to bring tears to eyes  Goal  status: INITIAL    3.  Pt will demonstrate grossly symmetrical and painless hip MMT in tested groups in order to facilitate improved functional strength.  Baseline: see MMT chart above Goal status: INITIAL  4.  Pt will be able to perform 5xSTS in less than or equal to 12sec in order to demonstrate reduced fall risk and improved functional independence (MCID 5xSTS = 2.3 sec). Baseline: 15sec w/o UE support, occasional asymmetry of WB Goal status: INITIAL    PLAN:  PT FREQUENCY: 1x/week  PT DURATION: 6 weeks  PLANNED INTERVENTIONS: 97164- PT Re-evaluation, 97750- Physical Performance Testing, 97110-Therapeutic exercises, 97530- Therapeutic activity, [id number]- Neuromuscular re-education, 97535- Self Care, [id number]- Manual therapy, [id number]- Gait training, [phone] (1-2 muscles), [id number] (3+ muscles)- Dry Needling, Patient/Family education, Balance training, Stair training, Taping, Joint mobilization, Spinal mobilization, Cryotherapy, and Moist heat  PLAN FOR NEXT SESSION: Review/update HEP PRN. Work on [employer] exercises as appropriate with emphasis on rotational hip mobility and strength, symmetry of WB. Symptom modification strategies as indicated/appropriate. Mindful of cardiac hx and cancer.    Alm DELENA Jenny PT, DPT 12/10/2023 12:12 PM

## 2023-12-11 ENCOUNTER — Other Ambulatory Visit: Payer: Self-pay

## 2023-12-13 ENCOUNTER — Telehealth: Payer: Self-pay | Admitting: Physical Therapy

## 2023-12-13 ENCOUNTER — Encounter: Admitting: Physical Therapy

## 2023-12-13 NOTE — Therapy (Deleted)
 OUTPATIENT PHYSICAL THERAPY LOWER EXTREMITY EVALUATION   Patient Name: Ronald [name] MRN: [id number] DOB:12-Nov-1945, 78 y.o., male Today's Date: 12/13/2023  END OF SESSION:    Past Medical History:  Diagnosis Date   Bronchitis    Cancer (HCC)    colon   History of kidney stones    Hyperlipidemia    Hypertension    Myocardial infarction [facility])    Past Surgical History:  Procedure Laterality Date   BIOPSY  07/24/2022   Procedure: BIOPSY;  Surgeon: Rollin Dover, MD;  Location: [facility];  Service: Gastroenterology;;   BIOPSY  05/14/2023   Procedure: BIOPSY;  Surgeon: Rollin Dover, MD;  Location: [facility];  Service: Gastroenterology;;   BIOPSY OF SKIN SUBCUTANEOUS TISSUE AND/OR MUCOUS MEMBRANE  09/17/2023   Procedure: BIOPSY, SKIN, SUBCUTANEOUS TISSUE, OR MUCOUS MEMBRANE;  Surgeon: Rollin Dover, MD;  Location: [facility];  Service: Gastroenterology;;   COLONOSCOPY WITH PROPOFOL  N/A 07/24/2022   Procedure: COLONOSCOPY WITH PROPOFOL ;  Surgeon: Rollin Dover, MD;  Location: [facility];  Service: Gastroenterology;  Laterality: N/A;   CORONARY STENT INTERVENTION N/A 12/23/2021   Procedure: CORONARY STENT INTERVENTION;  Surgeon: Ladona Heinz, MD;  Location: [facility];  Service: Cardiovascular;  Laterality: N/A;   FLEXIBLE SIGMOIDOSCOPY N/A 05/14/2023   Procedure: FLEXIBLE SIGMOIDOSCOPY;  Surgeon: Rollin Dover, MD;  Location: [facility];  Service: Gastroenterology;  Laterality: N/A;   FLEXIBLE SIGMOIDOSCOPY N/A 09/17/2023   Procedure: KINGSTON SIDE;  Surgeon: Rollin Dover, MD;  Location: [facility];  Service: Gastroenterology;  Laterality: N/A;   LEFT HEART CATH AND CORONARY ANGIOGRAPHY N/A 11/19/2021   Procedure: LEFT HEART CATH AND CORONARY ANGIOGRAPHY;  Surgeon: Elmira Newman PARAS, MD;  Location: [facility];  Service: Cardiovascular;  Laterality: N/A;   LEFT HEART CATHETERIZATION WITH CORONARY ANGIOGRAM N/A 01/15/2014   Procedure: LEFT HEART  CATHETERIZATION WITH CORONARY ANGIOGRAM;  Surgeon: Ozell JONETTA Fell, MD;  Location: [facility] CATH [facility];  Service: Cardiovascular;  Laterality: N/A;   PORTACATH PLACEMENT N/A 08/18/2022   Procedure: INSERTION PORT-A-CATH;  Surgeon: Teresa Lonni HERO, MD;  Location: [facility];  Service: General;  Laterality: N/A;  60   Patient Active Problem List   Diagnosis Date Noted   Posterior pain of left hip 11/17/2023   Hamstring tightness of left lower extremity 11/17/2023   Rectal cancer (HCC) 08/12/2022   Angina pectoris (HCC)    Coronary artery disease involving native coronary artery of native heart with unstable angina pectoris (HCC) 04/18/2021   Asymptomatic bilateral carotid artery stenosis 04/18/2021   Mixed hyperlipidemia 04/18/2021   Tobacco abuse 01/16/2014   NSTEMI (non-ST elevated myocardial infarction) (HCC) 01/14/2014   Essential hypertension 01/14/2014    PCP: Virgia Dorothe BRAVO, MD  REFERRING PROVIDER: Emeline Joesph BROCKS, DO  REFERRING DIAG: [phone] (ICD-10-CM) - Posterior pain of left hip M62.89 (ICD-10-CM) - Hamstring tightness of left lower extremity  THERAPY DIAG:  No diagnosis found.  Rationale for Evaluation and Treatment: Rehabilitation  ONSET DATE: Fall 2024  SUBJECTIVE:  From eval: Pt endorses history of back pain in the past but no prior hip issues. He reports onset around the time of receiving radiation for colon cancer in autumn 2024, denies any other MOI or change in activity. States he was placed on steroids and this helped, but pain came back after cessation. He states initially symptoms would go down into leg/ankle (posteriorly, described as sharp pain), but now localized to his lateral/posterior hip. He states he got a CT and MRI through the [place] which  showed a lesion or a tear in this area. He states he is limiting his walking a bit but otherwise no overt limitations. He has difficulty describing any clear provocative factors, states it is random but will occur about  2-3x/day on avg. Denies any N/T, weakness, buckling, or R sided symptoms.   SUBJECTIVE STATEMENT: ***   PERTINENT HISTORY: cancer, HTN, MI, portacath (RUE) Pt also self reports hernia, stable and nonpainful Had radiation and chemo for colon cancer, states is in remission and follows w/ oncology every three months  PAIN:  Are you having pain: 1/10 Location/description: lateral hip, L side, sharp  Best-worst over past week: 0-, just about bring tears to your eyes  - aggravating factors: walking, otherwise cannot recall consistent factors  - Easing factors: elevating LLE, steroids, pain cream    PRECAUTIONS: cancer, port a cath RUE, cardiac hx  RED FLAGS: None   WEIGHT BEARING RESTRICTIONS: No  FALLS:  Has patient fallen in last 6 months? No  LIVING ENVIRONMENT: Lives 3 story home, sometimes will have trouble with stairs Lives w/ wife, housework split, yardwork   OCCUPATION: retired - Lobbyist, heating/AC, draft beer  PLOF: Independent  PATIENT GOALS: reduce pain  NEXT MD VISIT: 12/29/23 Dr. Emeline   OBJECTIVE:  Note: Objective measures were completed at Evaluation unless otherwise noted.  DIAGNOSTIC FINDINGS:  11/15/23 PET scan: IMPRESSION: Multiple lung nodules are again seen as on prior examination. Several the show some level of abnormal uptake in the worrisome for lung metastases.   No additional soft tissue areas of abnormal uptake at this time.  11/10/23 CT L hip: IMPRESSION: 1. No acute osseous abnormality of the left hip. 2. Mild osteoarthritis of the left hip. 3. 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 4. 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     PATIENT SURVEYS:  LEFS: 47/80   SENSATION: Reports baseline neuropathy, no recent changes   MUSCLE LENGTH: Hamstrings: limited but grossly symmetrical BIL, non-concordant   LOWER  EXTREMITY ROM:      Right eval Left eval  Hip flexion    Hip extension    Hip internal rotation    Hip external rotation    Knee extension    Knee flexion    (Blank rows = not tested) (Key: WFL = within functional limits not formally assessed, * = concordant pain, s = stiffness/stretching sensation, NT = not tested)  Comments:  passive limitations into hip flexion grossly symmetrical, LLE concordant. Passive limitations into IR BIL, LLE concordant.   LOWER EXTREMITY MMT:    MMT Right eval Left eval  Hip flexion 5 5  Hip abduction (modified sitting) 5 5  Hip internal rotation 4+ 4+*  Hip external rotation 4+ 4+  Knee flexion 5 5  Knee extension 5 5  Ankle dorsiflexion 5 5   (Blank rows = not tested) (Key: WFL = within functional limits not formally assessed, * = concordant pain, s = stiffness/stretching sensation, NT = not tested)  Comments:    LOWER EXTREMITY SPECIAL TESTS:  Positive FABER and FADDIR LLE compared to R, no change w/ short axis distraction Hamstring tightness BIL, symmetrical and painless  FUNCTIONAL TESTS:  5xSTS: 15.25 sec no UE support, infrequent weight shift to R, nonpainful   GAIT: Distance walked: within clinic Assistive device utilized: None Level of assistance: Complete Independence Comments: reduced trunk rotation, reduced hip extension BIL  TREATMENT DATE:  [facility] Adult PT Treatment:                                                DATE: 12/13/23 Therapeutic Exercise: Seated hip adduction iso practice reps cues for breath control and posture STS practice cues for pacing and symmetry of WB Seated green band hip abduction practice reps cues for posture ***   [facility] Adult PT Treatment:                                                DATE: 12/10/23 Therapeutic Exercise: Seated hip adduction iso practice reps cues for breath  control and posture STS practice cues for pacing and symmetry of WB Seated green band hip abduction practice reps cues for posture HEP handout + education, education on relevant anatomy/physiology and rationale for interventions    PATIENT EDUCATION:  Education details: Pt education on PT impairments, prognosis, and POC. Informed consent. Rationale for interventions, safe/appropriate HEP performance Person educated: Patient Education method: Explanation, Demonstration, Tactile cues, Verbal cues Education comprehension: verbalized understanding, returned demonstration, verbal cues required, tactile cues required, and needs further education    HOME EXERCISE PROGRAM: Access Code: [email or web address] URL: [email or web address] Date: 12/10/2023 Prepared by: Alm Jenny  Exercises - Sit to Stand with Armchair  - 2-3 x daily - 1 sets - 5-8 reps - Seated Hip Adduction Isometrics with Ball  - 2-3 x daily - 1 sets - 8 reps - Seated Hip Abduction with Resistance  - 2-3 x daily - 1 sets - 8-10 reps  ASSESSMENT:  CLINICAL IMPRESSION: ***  From eval: Patient is a pleasant 78 y.o. gentleman who was seen today for physical therapy evaluation and treatment for L hip pain ongoing since October/November 2024. Describes pain as random but intense, occurring sporadically but ~2-3x/day on avg without clear provocative factors. On exam however he demonstrates consistent and concordant provocation with L hip rotational movements, especially into IR. Some hamstring tightness is noted but symmetrical and not concordant. Occasional asymmetry is noted with transfer mechanics during 5xSTS. At this point recommend trial of skilled PT to address relevant impairments w/ aim of reducing frequency/intensity of pain, although if no significant change over next few visits would recommend return to MD for further assessment given lack of clear provocative factors in occurrence outside of clinic. No adverse events, pt  tolerates exam/HEP well without increase in pain, verbalizes agreement/understanding with plan at this time. Pt departs today's session in no acute distress, all voiced questions/concerns addressed appropriately from PT perspective.      OBJECTIVE IMPAIRMENTS: Abnormal gait, decreased mobility, decreased ROM, decreased strength, improper body mechanics, and pain.   ACTIVITY LIMITATIONS: stairs, transfers, and locomotion level  PARTICIPATION LIMITATIONS: denies overt participation limitations  PERSONAL FACTORS: Age, Time since onset of injury/illness/exacerbation, and 3+ comorbidities: cancer, HTN, MI, portacath are also affecting patient's functional outcome.   REHAB POTENTIAL: Fair given comorbidities, chronicity, and lack of clear provocative factors in daily occurrence  CLINICAL DECISION MAKING: Evolving/moderate complexity  EVALUATION COMPLEXITY: Moderate   GOALS:   SHORT TERM GOALS: Target date: 12/31/2023  Pt will demonstrate appropriate understanding and performance of initially prescribed HEP in order to facilitate improved independence with management of symptoms.  Baseline: HEP established  Goal status: INITIAL   2. Pt will report at least 25% improvement in overall pain levels over past week in order to facilitate improved tolerance to typical daily activities.   Baseline: 2-3x/daily, painful enough to bring tears to eyes  Goal status: INITIAL    LONG TERM GOALS: Target date: 01/21/2024  Pt will score 60 or greater on LEFS in order to demonstrate improved perception of function due to symptoms (MCID 9 pts) Baseline: 47/80 Goal status: INITIAL  2.  Pt will report at least 50% decrease in overall pain levels in past week in order to facilitate improved tolerance to basic ADLs/mobility.   Baseline: 2-3x/daily, painful enough to bring tears to eyes  Goal status: INITIAL    3.  Pt will demonstrate grossly symmetrical and painless hip MMT in tested groups in order to  facilitate improved functional strength.  Baseline: see MMT chart above Goal status: INITIAL  4.  Pt will be able to perform 5xSTS in less than or equal to 12sec in order to demonstrate reduced fall risk and improved functional independence (MCID 5xSTS = 2.3 sec). Baseline: 15sec w/o UE support, occasional asymmetry of WB Goal status: INITIAL    PLAN:  PT FREQUENCY: 1x/week  PT DURATION: 6 weeks  PLANNED INTERVENTIONS: 97164- PT Re-evaluation, 97750- Physical Performance Testing, 97110-Therapeutic exercises, 97530- Therapeutic activity, [id number]- Neuromuscular re-education, 97535- Self Care, [id number]- Manual therapy, [id number]- Gait training, [id number] (1-2 muscles), [id number] (3+ muscles)- Dry Needling, Patient/Family education, Balance training, Stair training, Taping, Joint mobilization, Spinal mobilization, Cryotherapy, and Moist heat  PLAN FOR NEXT SESSION: Review/update HEP PRN. Work on [employer] exercises as appropriate with emphasis on rotational hip mobility and strength, symmetry of WB. Symptom modification strategies as indicated/appropriate. Mindful of cardiac hx and cancer.    Sakiya Stepka April Ma L Jeramia Saleeby, PT, DPT 12/13/2023 8:39 AM

## 2023-12-13 NOTE — Telephone Encounter (Addendum)
 Called pt in regards to no-show #1. No answer but able to leave voicemail. Reminded pt of his next appointment and to call clinic if any problems with scheduling or needs to cancel.  Ronald [name] April Ma L Ettie Krontz, PT, DPT

## 2023-12-14 ENCOUNTER — Telehealth: Payer: Self-pay | Admitting: *Deleted

## 2023-12-14 NOTE — Telephone Encounter (Addendum)
 [name] called to report the oncologist at the [facility], Dr. Aloha (he thinks that is her name) wants to be a biopsy of lung instead of having CT scan prior. He is asking what Dr. Cloretta thinks about this. Received call from [facility] w/Dr. Ethelda with [facility] (oncology). Requesting his molecular testing results be faxed to [phone] (this was done). She reports he is being sent to pulmonary for a bronch w/biopsy. Leeroy at [phone] ext [phone]

## 2023-12-15 ENCOUNTER — Encounter: Payer: Self-pay | Admitting: Oncology

## 2023-12-22 ENCOUNTER — Encounter: Payer: Self-pay | Admitting: Physical Therapy

## 2023-12-22 ENCOUNTER — Telehealth: Payer: Self-pay | Admitting: *Deleted

## 2023-12-22 ENCOUNTER — Ambulatory Visit: Admitting: Physical Therapy

## 2023-12-22 DIAGNOSIS — M25552 Pain in left hip: Secondary | ICD-10-CM

## 2023-12-22 NOTE — Telephone Encounter (Signed)
 Called Ronald [name] and notified him that Dr. Cloretta said the bronch and biopsy is a very reasonable approach to confirm metastatic rectal cancer. He said there is a high likelihood the nodules are due to rectal cancer.

## 2023-12-22 NOTE — Therapy (Signed)
 OUTPATIENT PHYSICAL THERAPY LOWER EXTREMITY TREATMENT   Patient Name: SAMIER JACO MRN: [id number] DOB:01/13/1946, 78 y.o., male Today's Date: 12/22/2023  END OF SESSION:  PT End of Session - 12/22/23 0847     Visit Number 2    Number of Visits 7    Date for PT Re-Evaluation 01/21/24    Authorization Type [place]    Authorization Time Period dates 09/09/23 - 05/15/24  approved 15 visits    Authorization - Visit Number 2    Authorization - Number of Visits 15    PT Start Time 0847    PT Stop Time 0925    PT Time Calculation (min) 38 min    Activity Tolerance Patient tolerated treatment well    Behavior During Therapy [facility] for tasks assessed/performed           Past Medical History:  Diagnosis Date   Bronchitis    Cancer (HCC)    colon   History of kidney stones    Hyperlipidemia    Hypertension    Myocardial infarction [facility])    Past Surgical History:  Procedure Laterality Date   BIOPSY  07/24/2022   Procedure: BIOPSY;  Surgeon: Rollin Dover, MD;  Location: [facility];  Service: Gastroenterology;;   BIOPSY  05/14/2023   Procedure: BIOPSY;  Surgeon: Rollin Dover, MD;  Location: [facility];  Service: Gastroenterology;;   BIOPSY OF SKIN SUBCUTANEOUS TISSUE AND/OR MUCOUS MEMBRANE  09/17/2023   Procedure: BIOPSY, SKIN, SUBCUTANEOUS TISSUE, OR MUCOUS MEMBRANE;  Surgeon: Rollin Dover, MD;  Location: [facility];  Service: Gastroenterology;;   COLONOSCOPY WITH PROPOFOL  N/A 07/24/2022   Procedure: COLONOSCOPY WITH PROPOFOL ;  Surgeon: Rollin Dover, MD;  Location: [facility];  Service: Gastroenterology;  Laterality: N/A;   CORONARY STENT INTERVENTION N/A 12/23/2021   Procedure: CORONARY STENT INTERVENTION;  Surgeon: Ladona Heinz, MD;  Location: [facility];  Service: Cardiovascular;  Laterality: N/A;   FLEXIBLE SIGMOIDOSCOPY N/A 05/14/2023   Procedure: FLEXIBLE SIGMOIDOSCOPY;  Surgeon: Rollin Dover, MD;  Location: [facility];  Service: Gastroenterology;  Laterality: N/A;    FLEXIBLE SIGMOIDOSCOPY N/A 09/17/2023   Procedure: KINGSTON SIDE;  Surgeon: Rollin Dover, MD;  Location: [facility];  Service: Gastroenterology;  Laterality: N/A;   LEFT HEART CATH AND CORONARY ANGIOGRAPHY N/A 11/19/2021   Procedure: LEFT HEART CATH AND CORONARY ANGIOGRAPHY;  Surgeon: Elmira Newman PARAS, MD;  Location: [facility];  Service: Cardiovascular;  Laterality: N/A;   LEFT HEART CATHETERIZATION WITH CORONARY ANGIOGRAM N/A 01/15/2014   Procedure: LEFT HEART CATHETERIZATION WITH CORONARY ANGIOGRAM;  Surgeon: Ozell JONETTA Fell, MD;  Location: [facility] C[facility];  Service: Cardiovascular;  Laterality: N/A;   PORTACATH PLACEMENT N/A 08/18/2022   Procedure: INSERTION PORT-A-CATH;  Surgeon: Teresa Lonni HERO, MD;  Location: [facility];  Service: General;  Laterality: N/A;  60   Patient Active Problem List   Diagnosis Date Noted   Posterior pain of left hip 11/17/2023   Hamstring tightness of left lower extremity 11/17/2023   Rectal cancer (HCC) 08/12/2022   Angina pectoris (HCC)    Coronary artery disease involving native coronary artery of native heart with unstable angina pectoris (HCC) 04/18/2021   Asymptomatic bilateral carotid artery stenosis 04/18/2021   Mixed hyperlipidemia 04/18/2021   Tobacco abuse 01/16/2014   NSTEMI (non-ST elevated myocardial infarction) (HCC) 01/14/2014   Essential hypertension 01/14/2014    PCP: Virgia Dorothe BRAVO, MD  REFERRING PROVIDER: Emeline Joesph BROCKS, DO  REFERRING DIAG: [id number] (ICD-10-CM) - Posterior pain of left hip M62.89 (ICD-10-CM) -  Hamstring tightness of left lower extremity  THERAPY DIAG:  Pain in left hip  Rationale for Evaluation and Treatment: Rehabilitation  ONSET DATE: Fall 2024  SUBJECTIVE:  From eval: Pt endorses history of back pain in the past but no prior hip issues. He reports onset around the time of receiving radiation for colon cancer in autumn 2024, denies any other MOI or change in activity. States he was  placed on steroids and this helped, but pain came back after cessation. He states initially symptoms would go down into leg/ankle (posteriorly, described as sharp pain), but now localized to his lateral/posterior hip. He states he got a CT and MRI through the [place] which showed a lesion or a tear in this area. He states he is limiting his walking a bit but otherwise no overt limitations. He has difficulty describing any clear provocative factors, states it is random but will occur about 2-3x/day on avg. Denies any N/T, weakness, buckling, or R sided symptoms.   SUBJECTIVE STATEMENT: Pt states he tried the exercises but felt they aggravated his hip. Pt reports pain is sporadic -- can go 2-3 days without any issues.    PERTINENT HISTORY: cancer, HTN, MI, portacath (RUE) Pt also self reports hernia, stable and nonpainful Had radiation and chemo for colon cancer, states is in remission and follows w/ oncology every three months  PAIN:  Are you having pain: 3/10 Location/description: lateral hip, L side, sharp  Best-worst over past week: 0-, just about bring tears to your eyes  - aggravating factors: walking, otherwise cannot recall consistent factors  - Easing factors: elevating LLE, steroids, pain cream    PRECAUTIONS: cancer, port a cath RUE, cardiac hx  RED FLAGS: None   WEIGHT BEARING RESTRICTIONS: No  FALLS:  Has patient fallen in last 6 months? No  LIVING ENVIRONMENT: Lives 3 story home, sometimes will have trouble with stairs Lives w/ wife, housework split, yardwork   OCCUPATION: retired - Lobbyist, heating/AC, draft beer  PLOF: Independent  PATIENT GOALS: reduce pain  NEXT MD VISIT: 12/29/23 Dr. Emeline   OBJECTIVE:  Note: Objective measures were completed at Evaluation unless otherwise noted.  DIAGNOSTIC FINDINGS:  11/15/23 PET scan: IMPRESSION: Multiple lung nodules are again seen as on prior examination. Several the show some level of abnormal uptake in the  worrisome for lung metastases.   No additional soft tissue areas of abnormal uptake at this time.  11/10/23 CT L hip: IMPRESSION: 1. No acute osseous abnormality of the left hip. 2. Mild osteoarthritis of the left hip. 3. 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 4. 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     PATIENT SURVEYS:  LEFS: 47/80   SENSATION: Reports baseline neuropathy, no recent changes   MUSCLE LENGTH: Hamstrings: limited but grossly symmetrical BIL, non-concordant   LOWER EXTREMITY ROM:      Right eval Left eval  Hip flexion    Hip extension    Hip internal rotation    Hip external rotation    Knee extension    Knee flexion    (Blank rows = not tested) (Key: WFL = within functional limits not formally assessed, * = concordant pain, s = stiffness/stretching sensation, NT = not tested)  Comments:  passive limitations into hip flexion grossly symmetrical, LLE concordant. Passive limitations into IR BIL, LLE concordant.   LOWER EXTREMITY MMT:    MMT Right eval Left eval  Hip flexion 5 5  Hip  abduction (modified sitting) 5 5  Hip internal rotation 4+ 4+*  Hip external rotation 4+ 4+  Knee flexion 5 5  Knee extension 5 5  Ankle dorsiflexion 5 5   (Blank rows = not tested) (Key: WFL = within functional limits not formally assessed, * = concordant pain, s = stiffness/stretching sensation, NT = not tested)  Comments:    LOWER EXTREMITY SPECIAL TESTS:  Positive FABER and FADDIR LLE compared to R, no change w/ short axis distraction Hamstring tightness BIL, symmetrical and painless  FUNCTIONAL TESTS:  5xSTS: 15.25 sec no UE support, infrequent weight shift to R, nonpainful   GAIT: Distance walked: within clinic Assistive device utilized: None Level of assistance: Complete Independence Comments: reduced trunk rotation, reduced hip extension  BIL                                                                                                                               TREATMENT DATE:  [facility] Adult PT Treatment:                                                DATE: 12/22/23 Therapeutic Exercise: Seated hamstring stretch x 30 Seated figure 4 stretch x 30 Seated piriformis stretch x 30 Supine LTR 10x5 Supine bent knee fall outs x30 Supine bridge red TB around thighs 2x10x3 Sidelying clamshell red TB 2x10 Sidelying reverse clamshell with ball squeeze 2x10 Seated hip adduction stretch x 30  Standing hip abd AROM 2x10 Standing hip add AROM 2x10 Manual Therapy: L LE LAD L LE hip ER/IR mobs grade II    [facility] Adult PT Treatment:                                                DATE: 12/10/23 Therapeutic Exercise: Seated hip adduction iso practice reps cues for breath control and posture STS practice cues for pacing and symmetry of WB Seated green band hip abduction practice reps cues for posture HEP handout + education, education on relevant anatomy/physiology and rationale for interventions    PATIENT EDUCATION:  Education details: Pt education on PT impairments, prognosis, and POC. Informed consent. Rationale for interventions, safe/appropriate HEP performance Person educated: Patient Education method: Explanation, Demonstration, Tactile cues, Verbal cues Education comprehension: verbalized understanding, returned demonstration, verbal cues required, tactile cues required, and needs further education    HOME EXERCISE PROGRAM: Access Code: [email or web address] URL: [email or web address] Date: 12/22/2023 Prepared by: Adelynn Gipe April Marie Ashleyann Shoun  Exercises - Seated Piriformis Stretch  - 1 x daily - 7 x weekly - 2 sets - 30 sec hold - Seated Piriformis Stretch  - 1 x daily - 7 x weekly - 2 sets - 30 sec  hold - Clamshell with Resistance  - 1 x daily - 7 x weekly - 2 sets - 10 reps - Sidelying Reverse Clamshell  - 1 x  daily - 7 x weekly - 2 sets - 10 reps - Standing Hip Adduction AROM  - 1 x daily - 7 x weekly - 2 sets - 10 reps - Standing Hip Abduction AROM  - 1 x daily - 7 x weekly - 2 sets - 10 reps  ASSESSMENT:  CLINICAL IMPRESSION: Modified current HEP as pt felt it was too irritating for his hip. Changed focus to more on hip/joint ROM, stretching and mobility with some strengthening for stability. L hip appears to catch with internal rotation movements provoking pt's pain. Could also feel it provoked with seated L hip adduction stretch  From eval: Patient is a pleasant 78 y.o. gentleman who was seen today for physical therapy evaluation and treatment for L hip pain ongoing since October/November 2024. Describes pain as random but intense, occurring sporadically but ~2-3x/day on avg without clear provocative factors. On exam however he demonstrates consistent and concordant provocation with L hip rotational movements, especially into IR. Some hamstring tightness is noted but symmetrical and not concordant. Occasional asymmetry is noted with transfer mechanics during 5xSTS. At this point recommend trial of skilled PT to address relevant impairments w/ aim of reducing frequency/intensity of pain, although if no significant change over next few visits would recommend return to MD for further assessment given lack of clear provocative factors in occurrence outside of clinic. No adverse events, pt tolerates exam/HEP well without increase in pain, verbalizes agreement/understanding with plan at this time. Pt departs today's session in no acute distress, all voiced questions/concerns addressed appropriately from PT perspective.      OBJECTIVE IMPAIRMENTS: Abnormal gait, decreased mobility, decreased ROM, decreased strength, improper body mechanics, and pain.   ACTIVITY LIMITATIONS: stairs, transfers, and locomotion level  PARTICIPATION LIMITATIONS: denies overt participation limitations  PERSONAL FACTORS: Age,  Time since onset of injury/illness/exacerbation, and 3+ comorbidities: cancer, HTN, MI, portacath are also affecting patient's functional outcome.   REHAB POTENTIAL: Fair given comorbidities, chronicity, and lack of clear provocative factors in daily occurrence  CLINICAL DECISION MAKING: Evolving/moderate complexity  EVALUATION COMPLEXITY: Moderate   GOALS:   SHORT TERM GOALS: Target date: 12/31/2023  Pt will demonstrate appropriate understanding and performance of initially prescribed HEP in order to facilitate improved independence with management of symptoms.  Baseline: HEP established  Goal status: INITIAL   2. Pt will report at least 25% improvement in overall pain levels over past week in order to facilitate improved tolerance to typical daily activities.   Baseline: 2-3x/daily, painful enough to bring tears to eyes  Goal status: INITIAL    LONG TERM GOALS: Target date: 01/21/2024  Pt will score 60 or greater on LEFS in order to demonstrate improved perception of function due to symptoms (MCID 9 pts) Baseline: 47/80 Goal status: INITIAL  2.  Pt will report at least 50% decrease in overall pain levels in past week in order to facilitate improved tolerance to basic ADLs/mobility.   Baseline: 2-3x/daily, painful enough to bring tears to eyes  Goal status: INITIAL    3.  Pt will demonstrate grossly symmetrical and painless hip MMT in tested groups in order to facilitate improved functional strength.  Baseline: see MMT chart above Goal status: INITIAL  4.  Pt will be able to perform 5xSTS in less than or equal to 12sec in order to demonstrate reduced fall  risk and improved functional independence (MCID 5xSTS = 2.3 sec). Baseline: 15sec w/o UE support, occasional asymmetry of WB Goal status: INITIAL    PLAN:  PT FREQUENCY: 1x/week  PT DURATION: 6 weeks  PLANNED INTERVENTIONS: 97164- PT Re-evaluation, 97750- Physical Performance Testing, 97110-Therapeutic exercises,  97530- Therapeutic activity, [id number]- Neuromuscular re-education, 97535- Self Care, [id number]- Manual therapy, [id number]- Gait training, [phone] (1-2 muscles), [id number] (3+ muscles)- Dry Needling, Patient/Family education, Balance training, Stair training, Taping, Joint mobilization, Spinal mobilization, Cryotherapy, and Moist heat  PLAN FOR NEXT SESSION: Review/update HEP PRN. Core/pelvic strength/mobility. Work on [employer] exercises as appropriate with emphasis on rotational hip mobility and strength, symmetry of WB. Symptom modification strategies as indicated/appropriate. Mindful of cardiac hx and cancer.    Gilda Abboud April Ma L Tyronn Golda, PT, DPT 12/22/2023 8:48 AM

## 2023-12-28 ENCOUNTER — Encounter: Payer: Self-pay | Admitting: Physical Therapy

## 2023-12-28 ENCOUNTER — Ambulatory Visit: Admitting: Physical Therapy

## 2023-12-28 DIAGNOSIS — M25552 Pain in left hip: Secondary | ICD-10-CM | POA: Diagnosis not present

## 2023-12-28 NOTE — Therapy (Addendum)
 OUTPATIENT PHYSICAL THERAPY LOWER EXTREMITY TREATMENT AND DISCHARGE  PHYSICAL THERAPY DISCHARGE SUMMARY  Visits from Start of Care: 3  Current functional level related to goals / functional outcomes: See below   Remaining deficits: See below   Education / Equipment: HEP   Patient agrees to discharge. Patient goals were not met. Patient is being discharged due to not returning since the last visit.    Patient Name: Ronald [name] MRN: [id number] DOB:1945/12/23, 78 y.o., male Today's Date: 12/28/2023  END OF SESSION:  PT End of Session - 12/28/23 1018     Visit Number 3    Number of Visits 7    Date for PT Re-Evaluation 01/21/24    Authorization Type [facility]    Authorization Time Period dates 09/09/23 - 05/15/24  approved 15 visits    Authorization - Visit Number 3    Authorization - Number of Visits 15    PT Start Time 1018    PT Stop Time 1056    PT Time Calculation (min) 38 min    Activity Tolerance Patient tolerated treatment well    Behavior During Therapy WFL for tasks assessed/performed            Past Medical History:  Diagnosis Date   Bronchitis    Cancer (HCC)    colon   History of kidney stones    Hyperlipidemia    Hypertension    Myocardial infarction [facility])    Past Surgical History:  Procedure Laterality Date   BIOPSY  07/24/2022   Procedure: BIOPSY;  Surgeon: Rollin Dover, MD;  Location: [facility];  Service: Gastroenterology;;   BIOPSY  05/14/2023   Procedure: BIOPSY;  Surgeon: Rollin Dover, MD;  Location: [facility];  Service: Gastroenterology;;   BIOPSY OF SKIN SUBCUTANEOUS TISSUE AND/OR MUCOUS MEMBRANE  09/17/2023   Procedure: BIOPSY, SKIN, SUBCUTANEOUS TISSUE, OR MUCOUS MEMBRANE;  Surgeon: Rollin Dover, MD;  Location: [facility];  Service: Gastroenterology;;   COLONOSCOPY WITH PROPOFOL  N/A 07/24/2022   Procedure: COLONOSCOPY WITH PROPOFOL ;  Surgeon: Rollin Dover, MD;  Location: [facility];  Service: Gastroenterology;  Laterality: N/A;    CORONARY STENT INTERVENTION N/A 12/23/2021   Procedure: CORONARY STENT INTERVENTION;  Surgeon: Ladona Heinz, MD;  Location: [facility];  Service: Cardiovascular;  Laterality: N/A;   FLEXIBLE SIGMOIDOSCOPY N/A 05/14/2023   Procedure: FLEXIBLE SIGMOIDOSCOPY;  Surgeon: Rollin Dover, MD;  Location: [facility];  Service: Gastroenterology;  Laterality: N/A;   FLEXIBLE SIGMOIDOSCOPY N/A 09/17/2023   Procedure: KINGSTON SIDE;  Surgeon: Rollin Dover, MD;  Location: [facility];  Service: Gastroenterology;  Laterality: N/A;   LEFT HEART CATH AND CORONARY ANGIOGRAPHY N/A 11/19/2021   Procedure: LEFT HEART CATH AND CORONARY ANGIOGRAPHY;  Surgeon: Elmira Newman PARAS, MD;  Location: [facility];  Service: Cardiovascular;  Laterality: N/A;   LEFT HEART CATHETERIZATION WITH CORONARY ANGIOGRAM N/A 01/15/2014   Procedure: LEFT HEART CATHETERIZATION WITH CORONARY ANGIOGRAM;  Surgeon: Ozell JONETTA Fell, MD;  Location: [facility];  Service: Cardiovascular;  Laterality: N/A;   PORTACATH PLACEMENT N/A 08/18/2022   Procedure: INSERTION PORT-A-CATH;  Surgeon: Teresa Lonni HERO, MD;  Location: [facility];  Service: General;  Laterality: N/A;  60   Patient Active Problem List   Diagnosis Date Noted   Posterior pain of left hip 11/17/2023   Hamstring tightness of left lower extremity 11/17/2023   Rectal cancer (HCC) 08/12/2022   Angina pectoris (HCC)    Coronary artery disease involving native coronary artery of native heart with unstable angina  pectoris (HCC) 04/18/2021   Asymptomatic bilateral carotid artery stenosis 04/18/2021   Mixed hyperlipidemia 04/18/2021   Tobacco abuse 01/16/2014   NSTEMI (non-ST elevated myocardial infarction) (HCC) 01/14/2014   Essential hypertension 01/14/2014    PCP: Virgia Dorothe BRAVO, MD  REFERRING PROVIDER: Emeline Joesph BROCKS, DO  REFERRING DIAG: [phone] (ICD-10-CM) - Posterior pain of left hip M62.89 (ICD-10-CM) - Hamstring tightness of left lower  extremity  THERAPY DIAG:  Pain in left hip  Rationale for Evaluation and Treatment: Rehabilitation  ONSET DATE: Fall 2024  SUBJECTIVE:  From eval: Pt endorses history of back pain in the past but no prior hip issues. He reports onset around the time of receiving radiation for colon cancer in autumn 2024, denies any other MOI or change in activity. States he was placed on steroids and this helped, but pain came back after cessation. He states initially symptoms would go down into leg/ankle (posteriorly, described as sharp pain), but now localized to his lateral/posterior hip. He states he got a CT and MRI through the [place] which showed a lesion or a tear in this area. He states he is limiting his walking a bit but otherwise no overt limitations. He has difficulty describing any clear provocative factors, states it is random but will occur about 2-3x/day on avg. Denies any N/T, weakness, buckling, or R sided symptoms.   SUBJECTIVE STATEMENT: Started off not so good but easing up. Reports he did his HEP on/off. Walks a lot at home.    PERTINENT HISTORY: cancer, HTN, MI, portacath (RUE) Pt also self reports hernia, stable and nonpainful Had radiation and chemo for colon cancer, states is in remission and follows w/ oncology every three months  PAIN:  Are you having pain: 1-2/10 Location/description: lateral hip, L side, sharp  Best-worst over past week: 0-, just about bring tears to your eyes  - aggravating factors: walking, otherwise cannot recall consistent factors  - Easing factors: elevating LLE, steroids, pain cream    PRECAUTIONS: cancer, port a cath RUE, cardiac hx  RED FLAGS: None   WEIGHT BEARING RESTRICTIONS: No  FALLS:  Has patient fallen in last 6 months? No  LIVING ENVIRONMENT: Lives 3 story home, sometimes will have trouble with stairs Lives w/ wife, housework split, yardwork   OCCUPATION: retired - lobbyist, heating/AC, draft beer  PLOF:  Independent  PATIENT GOALS: reduce pain  NEXT MD VISIT: 12/29/23 Dr. Emeline   OBJECTIVE:  Note: Objective measures were completed at Evaluation unless otherwise noted.  DIAGNOSTIC FINDINGS:  11/15/23 PET scan: IMPRESSION: Multiple lung nodules are again seen as on prior examination. Several the show some level of abnormal uptake in the worrisome for lung metastases.   No additional soft tissue areas of abnormal uptake at this time.  11/10/23 CT L hip: IMPRESSION: 1. No acute osseous abnormality of the left hip. 2. Mild osteoarthritis of the left hip. 3. 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 4. 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     PATIENT SURVEYS:  LEFS: 47/80   SENSATION: Reports baseline neuropathy, no recent changes   MUSCLE LENGTH: Hamstrings: limited but grossly symmetrical BIL, non-concordant   LOWER EXTREMITY ROM:      Right eval Left eval  Hip flexion    Hip extension    Hip internal rotation    Hip external rotation    Knee extension    Knee flexion    (Blank rows = not tested) (Key: WFL =  within functional limits not formally assessed, * = concordant pain, s = stiffness/stretching sensation, NT = not tested)  Comments:  passive limitations into hip flexion grossly symmetrical, LLE concordant. Passive limitations into IR BIL, LLE concordant.   LOWER EXTREMITY MMT:    MMT Right eval Left eval  Hip flexion 5 5  Hip abduction (modified sitting) 5 5  Hip internal rotation 4+ 4+*  Hip external rotation 4+ 4+  Knee flexion 5 5  Knee extension 5 5  Ankle dorsiflexion 5 5   (Blank rows = not tested) (Key: WFL = within functional limits not formally assessed, * = concordant pain, s = stiffness/stretching sensation, NT = not tested)  Comments:    LOWER EXTREMITY SPECIAL TESTS:  Positive FABER and FADDIR LLE compared to R, no change w/  short axis distraction Hamstring tightness BIL, symmetrical and painless  FUNCTIONAL TESTS:  5xSTS: 15.25 sec no UE support, infrequent weight shift to R, nonpainful   GAIT: Distance walked: within clinic Assistive device utilized: None Level of assistance: Complete Independence Comments: reduced trunk rotation, reduced hip extension BIL                                                                                                                               TREATMENT DATE:  [facility] Adult PT Treatment:                                                DATE: 12/28/23 Therapeutic Exercise: Seated hamstring stretch x 30 Seated figure 4 stretch 2x 30 on L Seated piriformis stretch 2x 30 on L Seated hip flexor stretch x30 Quadruped child's pose 2x30 Quadruped fire hydrant 2x10 Quadruped donkey kicks 2x10 Standing ITB stretch 2x30 Therapeutic Activity: Standing hip adduction 2x10 Standing hip abduction 2x10 Curtsy squat at counter 2x10 (pt with increased pain with L leg back) Counter squat x10   [facility] Adult PT Treatment:                                                DATE: 12/22/23 Therapeutic Exercise: Seated hamstring stretch x 30 Seated figure 4 stretch x 30 Seated piriformis stretch x 30 Supine LTR 10x5 Supine bent knee fall outs x30 Supine bridge red TB around thighs 2x10x3 Sidelying clamshell red TB 2x10 Sidelying reverse clamshell with ball squeeze 2x10 Seated hip adduction stretch x 30  Standing hip abd AROM 2x10 Standing hip add AROM 2x10 Manual Therapy: L LE LAD L LE hip ER/IR mobs grade II    [facility] Adult PT Treatment:  DATE: 12/10/23 Therapeutic Exercise: Seated hip adduction iso practice reps cues for breath control and posture STS practice cues for pacing and symmetry of WB Seated green band hip abduction practice reps cues for posture HEP handout + education, education on relevant anatomy/physiology and  rationale for interventions    PATIENT EDUCATION:  Education details: Pt education on PT impairments, prognosis, and POC. Informed consent. Rationale for interventions, safe/appropriate HEP performance Person educated: Patient Education method: Explanation, Demonstration, Tactile cues, Verbal cues Education comprehension: verbalized understanding, returned demonstration, verbal cues required, tactile cues required, and needs further education    HOME EXERCISE PROGRAM: Access Code: [email or web address] URL: [email or web address] Date: 12/22/2023 Prepared by: Mahmood Boehringer April Earnie Starring  Exercises - Seated Piriformis Stretch  - 1 x daily - 7 x weekly - 2 sets - 30 sec hold - Seated Piriformis Stretch  - 1 x daily - 7 x weekly - 2 sets - 30 sec hold - Clamshell with Resistance  - 1 x daily - 7 x weekly - 2 sets - 10 reps - Sidelying Reverse Clamshell  - 1 x daily - 7 x weekly - 2 sets - 10 reps - Standing Hip Adduction AROM  - 1 x daily - 7 x weekly - 2 sets - 10 reps - Standing Hip Abduction AROM  - 1 x daily - 7 x weekly - 2 sets - 10 reps  ASSESSMENT:  CLINICAL IMPRESSION: Pt states his hip wasn't too bad after last session. Working on increasing weight bearing through hip without exacerbation. Most agitated with curtsy squat bringing L leg back -- felt okay with L leg planted and R leg coming back. Continued to work on improving L hip hypomobility (as seen with very stiff figure 4 stretch). Pt continues to work towards his goals.   From eval: Patient is a pleasant 78 y.o. gentleman who was seen today for physical therapy evaluation and treatment for L hip pain ongoing since October/November 2024. Describes pain as random but intense, occurring sporadically but ~2-3x/day on avg without clear provocative factors. On exam however he demonstrates consistent and concordant provocation with L hip rotational movements, especially into IR. Some hamstring tightness is noted but symmetrical and  not concordant. Occasional asymmetry is noted with transfer mechanics during 5xSTS. At this point recommend trial of skilled PT to address relevant impairments w/ aim of reducing frequency/intensity of pain, although if no significant change over next few visits would recommend return to MD for further assessment given lack of clear provocative factors in occurrence outside of clinic. No adverse events, pt tolerates exam/HEP well without increase in pain, verbalizes agreement/understanding with plan at this time. Pt departs today's session in no acute distress, all voiced questions/concerns addressed appropriately from PT perspective.      OBJECTIVE IMPAIRMENTS: Abnormal gait, decreased mobility, decreased ROM, decreased strength, improper body mechanics, and pain.   ACTIVITY LIMITATIONS: stairs, transfers, and locomotion level  PARTICIPATION LIMITATIONS: denies overt participation limitations  PERSONAL FACTORS: Age, Time since onset of injury/illness/exacerbation, and 3+ comorbidities: cancer, HTN, MI, portacath are also affecting patient's functional outcome.   REHAB POTENTIAL: Fair given comorbidities, chronicity, and lack of clear provocative factors in daily occurrence  CLINICAL DECISION MAKING: Evolving/moderate complexity  EVALUATION COMPLEXITY: Moderate   GOALS:   SHORT TERM GOALS: Target date: 12/31/2023  Pt will demonstrate appropriate understanding and performance of initially prescribed HEP in order to facilitate improved independence with management of symptoms.  Baseline: HEP established  Goal status: INITIAL   2. Pt will  report at least 25% improvement in overall pain levels over past week in order to facilitate improved tolerance to typical daily activities.   Baseline: 2-3x/daily, painful enough to bring tears to eyes  Goal status: INITIAL    LONG TERM GOALS: Target date: 01/21/2024  Pt will score 60 or greater on LEFS in order to demonstrate improved perception of  function due to symptoms (MCID 9 pts) Baseline: 47/80 Goal status: INITIAL  2.  Pt will report at least 50% decrease in overall pain levels in past week in order to facilitate improved tolerance to basic ADLs/mobility.   Baseline: 2-3x/daily, painful enough to bring tears to eyes  Goal status: INITIAL    3.  Pt will demonstrate grossly symmetrical and painless hip MMT in tested groups in order to facilitate improved functional strength.  Baseline: see MMT chart above Goal status: INITIAL  4.  Pt will be able to perform 5xSTS in less than or equal to 12sec in order to demonstrate reduced fall risk and improved functional independence (MCID 5xSTS = 2.3 sec). Baseline: 15sec w/o UE support, occasional asymmetry of WB Goal status: INITIAL    PLAN:  PT FREQUENCY: 1x/week  PT DURATION: 6 weeks  PLANNED INTERVENTIONS: [id number]- PT Re-evaluation, 97750- Physical Performance Testing, 97110-Therapeutic exercises, 97530- Therapeutic activity, [id number]- Neuromuscular re-education, 97535- Self Care, [id number]- Manual therapy, [id number]- Gait training, [phone] (1-2 muscles), [phone] (3+ muscles)- Dry Needling, Patient/Family education, Balance training, Stair training, Taping, Joint mobilization, Spinal mobilization, Cryotherapy, and Moist heat  PLAN FOR NEXT SESSION: Check goals. Review/update HEP PRN. Core/pelvic strength/mobility. Work on [employer] exercises as appropriate with emphasis on rotational hip mobility and strength, symmetry of WB. Symptom modification strategies as indicated/appropriate. Mindful of cardiac hx and cancer.    Render Marley April Ma L Isabele Lollar, PT, DPT 12/28/2023 10:18 AM

## 2023-12-29 ENCOUNTER — Encounter: Attending: Physical Medicine and Rehabilitation | Admitting: Physical Medicine and Rehabilitation

## 2023-12-29 ENCOUNTER — Encounter: Payer: Self-pay | Admitting: Physical Medicine and Rehabilitation

## 2023-12-29 ENCOUNTER — Other Ambulatory Visit: Payer: Self-pay

## 2023-12-29 VITALS — BP 160/72 | HR 83 | Ht 68.0 in | Wt 166.2 lb

## 2023-12-29 DIAGNOSIS — M1612 Unilateral primary osteoarthritis, left hip: Secondary | ICD-10-CM | POA: Insufficient documentation

## 2023-12-29 DIAGNOSIS — M5432 Sciatica, left side: Secondary | ICD-10-CM | POA: Insufficient documentation

## 2023-12-29 DIAGNOSIS — M25552 Pain in left hip: Secondary | ICD-10-CM | POA: Insufficient documentation

## 2023-12-29 MED ORDER — MELOXICAM 15 MG PO TABS
15.0000 mg | ORAL_TABLET | Freq: Every day | ORAL | 0 refills | Status: AC
Start: 2023-12-29 — End: 2024-01-12

## 2023-12-29 NOTE — Progress Notes (Unsigned)
 Subjective:    Patient ID: Ronald [name], male    DOB: [birth year], 78 y.o.   MRN: [id number]  HPI  Ronald [name] is a 78 y.o. year old male  who  has a past medical history of Bronchitis, Cancer (HCC), History of kidney stones, Hyperlipidemia, Hypertension, and Myocardial infarction (HCC).   They are presenting to PM&R clinic for follow up related to chronic L hip pain .  Plan from last visit: Posterior pain of left hip Hamstring tightness of left lower extremity -     Ambulatory referral to Physical Therapy Pain seems to be located around left ischial tuberosity; may represent bursitis,  hamstring tendinitis, or piriformis syndrome with symptoms close extending down the posterior leg/calf.  Differential does include radiation therapy related neuropathy, since this started around the same time as his treatments last October.   Go to PT for your L hip for possible piriformis syndrome   Xray L hip 09/07/23: Mild degenerative changes at the left hip. Moderate lower lumbar degenerative changes. Possible vague luscent lesion at the femoral head measuring 1.5 cm.    CT L hip 11/2023: IMPRESSION: 1. No acute osseous abnormality of the left hip. 2. Mild osteoarthritis of the left hip. 3. 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 4. 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   Use voltaren gel over the counter up to 4 times daily and heat 20 minutes up to 4 times daily on left posterior hip.   Follow up in 6-8 weeks.    If no improvement, will consider MRI low back and EMG BL LE for sciatica vs. Radiation neuropathy   Interval Hx:  - Therapies: Has been going to PT, has not found beneficial yet. Has been to 4-5 sessions and compliant with HEP. He does a lot of  walking as well. Pain still occassionally goes down the back of his ankle. No weaknes sor instability.    - Follow ups: Has  scans due with oncology 8/16; has some biopsies pending as well.    - Falls: none   - DME: none   - Medications: Dr. Ivin at the [facility] put him on a prednisone taper in May, which was the most helpful. His friend had a bottle of prednisone 20 mg left over and he has been taking 1/2 tab in the morning every day, which has been very helpful. He uses voltaren off and op - has occassional benefit.    - Other concerns: Pain is sporadic - not exacerbated by pressure or specific movements.   Pain Inventory Average Pain 5 Pain Right Now 1 My pain is intermittent, sharp, and needle sticks  In the last 24 hours, has pain interfered with the following? General activity 7 Relation with others 7 Enjoyment of life 10 What TIME of day is your pain at its worst? daytime Sleep (in general) Fair  Pain is worse with: walking, sitting, standing, and some activites Pain improves with: rest and medication Relief from Meds: 2  No family history on file. 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   Social Drivers of Health   Financial Resource Strain: Low Risk  (08/06/2022)   Overall Financial Resource Strain (CARDIA)    Difficulty of Paying Living Expenses: Not hard at all  Food Insecurity: No Food Insecurity (09/15/2022)   Hunger Vital Sign    Worried About Running Out of Food in the Last Year: Never true    Ran Out of Food in the Last Year: Never true  Transportation Needs:  No Transportation Needs (09/15/2022)   PRAPARE - Administrator, Civil Service (Medical): No    Lack of Transportation (Non-Medical): No  Physical Activity: Not on file  Stress: No Stress Concern Present (08/06/2022)   [employer] of Occupational Health - Occupational Stress Questionnaire    Feeling of Stress : Not at all  Social Connections: Not on file   Past Surgical History:  Procedure Laterality Date   BIOPSY  07/24/2022   Procedure: BIOPSY;  Surgeon: Rollin Dover, MD;  Location: [facility];  Service: Gastroenterology;;   BIOPSY  05/14/2023   Procedure: BIOPSY;  Surgeon: Rollin Dover, MD;  Location: [facility];  Service: Gastroenterology;;   BIOPSY OF SKIN SUBCUTANEOUS TISSUE AND/OR MUCOUS MEMBRANE  09/17/2023   Procedure: BIOPSY, SKIN, SUBCUTANEOUS TISSUE, OR MUCOUS MEMBRANE;  Surgeon: Rollin Dover, MD;  Location: [facility];  Service: Gastroenterology;;   COLONOSCOPY WITH PROPOFOL  N/A 07/24/2022   Procedure: COLONOSCOPY WITH PROPOFOL ;  Surgeon: Rollin Dover, MD;  Location: [facility];  Service: Gastroenterology;  Laterality: N/A;   CORONARY STENT INTERVENTION N/A 12/23/2021   Procedure: CORONARY STENT INTERVENTION;  Surgeon: Ladona Heinz, MD;  Location: [facility];  Service: Cardiovascular;  Laterality: N/A;   FLEXIBLE SIGMOIDOSCOPY N/A 05/14/2023   Procedure: FLEXIBLE SIGMOIDOSCOPY;  Surgeon: Rollin Dover, MD;  Location: [facility];  Service: Gastroenterology;  Laterality: N/A;   FLEXIBLE SIGMOIDOSCOPY N/A 09/17/2023   Procedure: KINGSTON SIDE;  Surgeon: Rollin Dover, MD;  Location: [facility];  Service: Gastroenterology;  Laterality: N/A;   LEFT HEART CATH AND CORONARY ANGIOGRAPHY N/A 11/19/2021   Procedure: LEFT HEART CATH AND CORONARY ANGIOGRAPHY;  Surgeon: Elmira Newman PARAS, MD;  Location: [facility];  Service: Cardiovascular;  Laterality: N/A;   LEFT HEART CATHETERIZATION WITH CORONARY ANGIOGRAM N/A 01/15/2014   Procedure:  LEFT HEART CATHETERIZATION WITH CORONARY ANGIOGRAM;  Surgeon: Ozell JONETTA Fell, MD;  Location: [facility];  Service: Cardiovascular;  Laterality: N/A;   PORTACATH PLACEMENT N/A 08/18/2022   Procedure: INSERTION PORT-A-CATH;  Surgeon: Teresa Lonni HERO, MD;  Location: [facility];  Service: General;  Laterality: N/A;  60   Past Surgical History:  Procedure Laterality Date   BIOPSY  07/24/2022   Procedure: BIOPSY;  Surgeon: Rollin Dover, MD;  Location: [facility];  Service: Gastroenterology;;   BIOPSY  05/14/2023   Procedure: BIOPSY;  Surgeon: Rollin Dover, MD;  Location: [facility];  Service: Gastroenterology;;   BIOPSY OF SKIN SUBCUTANEOUS TISSUE AND/OR MUCOUS MEMBRANE  09/17/2023   Procedure: BIOPSY, SKIN, SUBCUTANEOUS TISSUE, OR MUCOUS MEMBRANE;  Surgeon: Rollin Dover, MD;  Location: [facility];  Service: Gastroenterology;;   COLONOSCOPY WITH PROPOFOL  N/A 07/24/2022   Procedure: COLONOSCOPY WITH PROPOFOL ;  Surgeon: Rollin Dover, MD;  Location: [facility];  Service: Gastroenterology;  Laterality: N/A;   CORONARY STENT INTERVENTION N/A 12/23/2021   Procedure: CORONARY STENT INTERVENTION;  Surgeon: Ladona Heinz, MD;  Location: [facility];  Service: Cardiovascular;  Laterality: N/A;   FLEXIBLE SIGMOIDOSCOPY N/A 05/14/2023   Procedure: FLEXIBLE SIGMOIDOSCOPY;  Surgeon: Rollin Dover, MD;  Location: [facility];  Service: Gastroenterology;  Laterality: N/A;   FLEXIBLE SIGMOIDOSCOPY N/A 09/17/2023   Procedure: KINGSTON SIDE;  Surgeon: Rollin Dover, MD;  Location: [facility];  Service: Gastroenterology;  Laterality: N/A;   LEFT HEART CATH AND CORONARY ANGIOGRAPHY N/A 11/19/2021   Procedure: LEFT HEART CATH AND CORONARY ANGIOGRAPHY;  Surgeon: Elmira Newman PARAS, MD;  Location: [facility];  Service: Cardiovascular;  Laterality: N/A;   LEFT HEART CATHETERIZATION WITH CORONARY ANGIOGRAM N/A 01/15/2014   Procedure: LEFT HEART CATHETERIZATION WITH CORONARY ANGIOGRAM;   Surgeon: Ozell JONETTA Fell, MD;  Location: [facility];  Service: Cardiovascular;  Laterality: N/A;   PORTACATH PLACEMENT N/A 08/18/2022   Procedure: INSERTION PORT-A-CATH;  Surgeon: Teresa Lonni HERO, MD;  Location: [facility];  Service: General;  Laterality: N/A;  60   Past Medical History:  Diagnosis Date   Bronchitis    Cancer (HCC)    colon   History of kidney stones    Hyperlipidemia    Hypertension    Myocardial infarction (HCC)    BP (!) 160/72 (BP Location: Left Arm, Patient Position: Sitting, Cuff Size: Normal)   Pulse 83   Ht 5' 8 (1.727 m)   Wt 166 lb 3.2 oz (75.4 kg)   SpO2 97%   BMI 25.27 kg/m   Opioid Risk Score:   Fall Risk Score:  `1  Depression screen PHQ 2/9     11/29/2023   10:00 AM 11/17/2023    2:50 PM 10/29/2023   10:00 AM 09/15/2022    9:35 AM 08/05/2022    1:48 PM  Depression screen PHQ 2/9  Decreased Interest 0 0 0 0 0  Down, Depressed, Hopeless 0 0 0 0 0  PHQ - 2 Score 0 0 0 0 0  Altered sleeping 0 3     Tired, decreased energy 0 2     Change in appetite 0 0     Feeling bad or failure about yourself  0 0     Trouble concentrating 0 0     Moving slowly or fidgety/restless 0 1     Suicidal thoughts 0 0     PHQ-9 Score 0 6     Difficult doing work/chores  Somewhat difficult         Review of Systems  Musculoskeletal:  Positive for back pain.       Lower back pain, left hip pain  All other systems reviewed and are negative.      Objective:   Physical Exam    PE: Constitution: Appropriate appearance for age. No apparent distress   Resp: No respiratory distress. No accessory muscle usage. on RA and CTAB Cardio: Well perfused appearance.  No peripheral edema. Abdomen: Nondistended. Nontender.   Psych: Appropriate mood and affect. Neuro: AAOx4. No apparent cognitive deficits   Neurologic Exam:   DTRs: Reflexes were 2+ in bilateral achilles, patella, biceps, BR and triceps. Babinsky: flexor responses b/l.   Hoffmans: negative  b/l Sensory exam: revealed normal sensation in all dermatomal regions in bilateral lower extremities Motor exam: strength 5/5 throughout bilateral upper extremities and bilateral lower extremities Coordination: Fine motor coordination was normal.   Gait: normal  Hip Exam:  left Inspection: No gross abnormalities on inspection of hip.  No muscle atrophy.  Palpation:  Tenderness on posterior flank about 3 inches inferior and 2 inches medial to the iliac crest; not overlying ischial tuberosity, paraspinals, PSIS or SI joint.. No TTP elsewhere. No radiation of pain to the groin with ROM internal or external rotation.    Special/provocative testing: Unchanged   SLR: -   Slump test: -    Facet loading: -(very non-specific)   TTP at paraspinals: -(sensitive for facet pain...if no ttp then likely not facet pain)   Deri test: + L posterior hip pain, not SI joint   FAIR test: + L posterior hip pain just proximal to ischial tuberosity     Assessment & Plan:   Ronald [name] is a 78 y.o. year old male  who  has a past medical history of Bronchitis, Cancer (HCC), History of kidney stones, Hyperlipidemia, Hypertension, and Myocardial infarction (HCC).   They are presenting to PM&R clinic as a new patient for treatment of L posterior hip pain.  Mid-gluteal on exam, more superiro than prior, PT notes consistent with internal rotation maneuvers - more consistent with impingement vs OA, but no groin pain.   Posterior pain of left hip -     MR HIP LEFT WO CONTRAST; Future -     MR LUMBAR SPINE WO CONTRAST; Future  Arthritis of left hip -     MR HIP LEFT WO CONTRAST; Future  Left sided sciatica -     MR HIP LEFT WO CONTRAST; Future -     MR LUMBAR SPINE WO CONTRAST; Future  Other orders -     Meloxicam ; Take 1 tablet (15 mg total) by mouth daily for 14 days.  Dispense: 14 tablet; Refill: 0

## 2023-12-29 NOTE — Patient Instructions (Addendum)
 Follow up with Dr. Carilyn for left hip steroid injection under flouroscopy  Stop prednisone pills. Start meloxicam  15 mg daily for 2 weeks. You cannot take ibuprofen with this, but can take tylenol  as needed. Stop if any Gi upset, blood in stool, or swelling.  Finish PT  Get Mri low back and left hip; I will call you about results.   Follow up with me in 2 months.

## 2023-12-30 ENCOUNTER — Other Ambulatory Visit: Payer: Self-pay

## 2023-12-30 ENCOUNTER — Encounter: Payer: Self-pay | Admitting: Oncology

## 2023-12-31 ENCOUNTER — Encounter: Payer: Self-pay | Admitting: Physical Medicine and Rehabilitation

## 2024-01-04 ENCOUNTER — Encounter: Payer: Self-pay | Admitting: Physical Medicine and Rehabilitation

## 2024-01-04 ENCOUNTER — Ambulatory Visit

## 2024-01-05 ENCOUNTER — Other Ambulatory Visit

## 2024-01-05 ENCOUNTER — Ambulatory Visit (INDEPENDENT_AMBULATORY_CARE_PROVIDER_SITE_OTHER): Admitting: Student in an Organized Health Care Education/Training Program

## 2024-01-05 ENCOUNTER — Encounter: Payer: Self-pay | Admitting: Student in an Organized Health Care Education/Training Program

## 2024-01-05 ENCOUNTER — Telehealth: Payer: Self-pay

## 2024-01-05 VITALS — BP 118/60 | HR 84 | Temp 97.1°F | Ht 68.0 in | Wt 169.8 lb

## 2024-01-05 DIAGNOSIS — C20 Malignant neoplasm of rectum: Secondary | ICD-10-CM

## 2024-01-05 DIAGNOSIS — R911 Solitary pulmonary nodule: Secondary | ICD-10-CM | POA: Diagnosis not present

## 2024-01-05 NOTE — Telephone Encounter (Signed)
 Robotic Bronch with [facility] 01/25/2024 at 8:00am Lung Nodule [id number], [id number], [id number]  Ronald [name] please see bronch info.  Patient is aware of date and time. Bronch email has been sent.

## 2024-01-05 NOTE — H&P (View-Only) (Signed)
 Synopsis: Referred in for lung nodule by Ethelda Jordis SAUNDERS, MD  Assessment & Plan:   #Lung nodule (Primary) #Rectal Cancer  Nodule Location: RLL Nodule Size: 11 mm Nodule Spiculation: No Associated Lymphadenopathy: No Smoking Status (current) Extrathoracic cancer > 5 years prior (yes): Rectal cancer ECOG: 0  The patient is here to discuss their imaging abnormalities which includes multiple pulmonary nodules that show FDG avidity on PET/CT. 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 We did discuss this during our visit today and I have also reached out to the patient's primary oncologist. Patient would like to establish an etiology behind said nodules and would like to proceed with getting a biopsy done.  We discussed the importance of diagnosis and staging in lung malignancies, and the approach to obtaining a tissue diagnosis which would include robotic assisted navigational bronchoscopy with endobronchial ultrasound guided sampling.  We also discussed the risks associated with the procedure which include a 2% risk of pneumothorax, infection, bleeding, and nondiagnostic procedure in detail.  I explained that patients typically are able to return home the same day of the procedure, but in rare cases admission to the hospital for observation and treatment is required.  After our discussion, the patient elected to proceed with the procedure  Recommendations:  - CT SUPER D CHEST WO CONTRAST; Future - Procedural/ Surgical Case Request: VIDEO BRONCHOSCOPY WITH ENDOBRONCHIAL NAVIGATION; Future    I spent 60 minutes caring for this patient today, including preparing to see the patient, obtaining a medical history , reviewing a separately obtained history, performing a medically appropriate examination and/or evaluation, counseling and educating the patient/family/caregiver,  ordering medications, tests, or procedures, documenting clinical information in the electronic health record, and independently interpreting results (not separately reported/billed) and communicating results to the patient/family/caregiver  Belva November, MD [facility]   End of visit medications:  No orders of the defined types were placed in this encounter.    Current Outpatient Medications:    amLODipine  (NORVASC ) 5 MG tablet, Take 1 tablet (5 mg total) by mouth daily at 10 pm., Disp: 30 tablet, Rfl: 3   aspirin  81 MG EC tablet, Take 1 tablet (81 mg total) by mouth daily., Disp: 30 tablet, Rfl: 3   atorvastatin  (LIPITOR ) 80 MG tablet, Take 1 tablet (80 mg total) by mouth at bedtime., Disp: 90 tablet, Rfl: 3   cloNIDine  (CATAPRES ) 0.3 MG tablet, Take 1 tablet (0.3 mg total) by mouth 2 (two) times daily., Disp: 180 tablet, Rfl: 3   diphenhydramine-acetaminophen  (TYLENOL  PM) 25-500 MG TABS tablet, Take 2 tablets by mouth at bedtime., Disp: , Rfl:    Docusate Sodium (COLACE PO), Take 2 capsules by mouth 2 (two) times daily., Disp: , Rfl:    DULoxetine (CYMBALTA) 30 MG capsule, Take 30 mg by mouth daily., Disp: , Rfl:    ezetimibe  (ZETIA ) 10 MG tablet, Take 1 tablet (10 mg total) by mouth daily., Disp: 100 tablet, Rfl: 3   isosorbide  mononitrate (IMDUR ) 30 MG 24 hr tablet, Take 1 tablet (30 mg total) by mouth every morning., Disp: 90 tablet, Rfl: 1   lidocaine -prilocaine  (EMLA ) cream, Apply 1 Application topically as needed (Use as directed to apply to port area 1-2 hours prior to appt)., Disp: 30 g, Rfl: 0   MAGNESIUM  PO, Take 1 tablet by mouth at bedtime., Disp: , Rfl:    metoprolol  succinate (TOPROL -XL) 25 MG 24 hr tablet, Take  1 tablet (25 mg total) by mouth daily., Disp: 90 tablet, Rfl: 3   nitroGLYCERIN  (NITROSTAT ) 0.4 MG SL tablet, Place 1 tablet (0.4 mg total) under the tongue every 5 (five) minutes x 3 doses as needed for chest pain., Disp: 25 tablet, Rfl: 2    Omega-3 Fatty Acids (FISH OIL PO), Take 1 capsule by mouth daily., Disp: , Rfl:    omeprazole  (PRILOSEC  OTC) 20 MG tablet, Take 1 tablet (20 mg total) by mouth daily., Disp: 30 tablet, Rfl: 3   OVER THE COUNTER MEDICATION, Take 5 capsules by mouth daily at 6 (six) AM., Disp: , Rfl:    potassium chloride  SA (KLOR-CON  M) 20 MEQ tablet, Take 1 tablet (20 mEq total) by mouth 2 (two) times daily., Disp: 60 tablet, Rfl: 2   predniSONE (DELTASONE) 20 MG tablet, Take 20 mg by mouth as needed (it is his friends prescription)., Disp: , Rfl:    tamsulosin  (FLOMAX ) 0.4 MG CAPS capsule, Take 0.4 mg by mouth 2 (two) times daily., Disp: , Rfl:    TURMERIC PO, Take 2 tablets by mouth daily., Disp: , Rfl:    loperamide  (IMODIUM  A-D) 2 MG tablet, Take 1-2 tablets (2-4 mg total) by mouth 4 (four) times daily as needed for diarrhea or loose stools (Up to 8tabs/day). (Patient not taking: Reported on 01/05/2024), Disp: , Rfl:    meloxicam  (MOBIC ) 15 MG tablet, Take 1 tablet (15 mg total) by mouth daily for 14 days. (Patient not taking: Reported on 01/05/2024), Disp: 14 tablet, Rfl: 0   ondansetron  (ZOFRAN ) 8 MG tablet, TAKE ONE TABLET BY MOUTH EVERY 8 HOURS AS NEEDED (MAY USE FOR NAUSEA 3 DAYS AFTER CHEMO) (Patient not taking: Reported on 01/05/2024), Disp: 30 tablet, Rfl: 1   polyethylene glycol (MIRALAX / GLYCOLAX) 17 g packet, Take 17 g by mouth 2 (two) times daily. (Patient not taking: Reported on 01/05/2024), Disp: , Rfl:    Probiotic Product (RESTORA) CAPS, Take 1 capsule by mouth daily. (Patient not taking: Reported on 01/05/2024), Disp: , Rfl:    prochlorperazine  (COMPAZINE ) 10 MG tablet, Take 1 tablet (10 mg total) by mouth every 6 (six) hours as needed for nausea or vomiting. (Patient not taking: Reported on 01/05/2024), Disp: 30 tablet, Rfl: 0 No current facility-administered medications for this visit.  Facility-Administered Medications Ordered in Other Visits:    sodium chloride  flush (NS) 0.9 % injection 10 mL, 10 mL,  Intravenous, PRN, Cloretta Arley NOVAK, MD, 10 mL at 09/21/22 9053   Subjective:   PATIENT ID: Ronald [name] GENDER: male DOB: [birth year], MRN: [id number]  Chief Complaint  Patient presents with   Lung Mass    HPI  Patient is a pleasant 78 year old male with a history of rectal cancer followed by Dr. Cloretta presenting to clinic for the evaluation of a pulmonary nodule.  Patient was diagnosed with rectal cancer in March 2024 after a mass in the rectum was noted on colonoscopy. This was biopsied and showed moderately differentiated invasive adenocarcinoma.  He received neoadjuvant therapy with FOLFOX followed by neoadjuvant radiation therapy.  Repeat flexible sigmoidoscopy with biopsy of rectal scar did not show any malignancy. He then underwent restaging to evaluate candidacy for surgical resection. He was seen by surgery in January 2025 and given patient preference as well as multidisciplinary tumor board review, consensus was for a watch and wait approach with close surveillance given appearance of remission on imaging. Of note, his imaging workup was notable for for small pulmonary nodules that were suspicious  for metastasis.  Patient recently underwent a CT scan of the chest in June 2025 that showed progression of pulmonary nodules with enlargement of dominant right lower lobe pulmonary nodule.  He also had a PET scan in July 2025 that showed the pulmonary nodules to have FDG avidity, with the right lower lobe pulmonary nodule showing an SUV of 3.3.  The right upper lobe pulmonary nodule measuring 5 mm also showed mild FDG avidity with a SUV of 2.4.  Today the patient reports being in his usual state of health. He knows he is here to discuss his pulmonary nodule but is unable to elaborate further.  He reports that he was referred to us  by his oncologist at the [facility].  He has two oncologists (at [place] and at the [facility]). He has no respiratory symptoms and denies any shortness of breath, cough,  sputum production, hemoptysis, fevers, chills, night sweats, or weight loss.  Patient has a history of smoking, mostly smoking 2 to 3 cigars a day. He reports that smoking cigars helps with his PTSD.  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  He was also at [employer]  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  Past medical History notable for CAD, hypertension, hyperlipidemia, and colon cancer.  Ancillary information including prior medications, full medical/surgical/family/social histories, and PFTs (when available) are listed below and have been reviewed.   Review of Systems  Constitutional:  Negative for chills, fever and weight loss.  Respiratory:  Negative for cough, hemoptysis, shortness of breath and wheezing.   Cardiovascular:  Negative for chest pain.     Objective:   Vitals:   01/05/24 1042  BP: 118/60  Pulse: 84  Temp: (!) 97.1 F (36.2 C)  SpO2: 97%  Weight: 169 lb 12.8 oz (77 kg)  Height: 5' 8 (1.727 m)   97% on RA BMI Readings from Last 3 Encounters:  01/05/24 25.82 kg/m  12/29/23 25.27 kg/m  11/29/23 25.64 kg/m   Wt Readings from Last 3 Encounters:  01/05/24 169 lb 12.8 oz (77 kg)  12/29/23 166 lb 3.2 oz (75.4 kg)  11/29/23 168 lb 9.6 oz (76.5 kg)    Physical Exam Constitutional:      Appearance: Normal appearance.  Cardiovascular:     Rate and Rhythm: Normal rate and regular rhythm.     Pulses: Normal pulses.     Heart sounds: Normal heart sounds.  Pulmonary:     Effort: Pulmonary effort is normal.     Breath sounds: Normal breath sounds.  Abdominal:     Palpations: Abdomen is soft.  Neurological:     General: No focal deficit present.     Mental Status: He is alert and oriented to person, place, and time. Mental status is at baseline.       Ancillary Information    Past Medical History:  Diagnosis Date   Bronchitis    Cancer [facility])    colon   History of kidney stones     Hyperlipidemia    Hypertension    Myocardial infarction [facility])      History reviewed. No pertinent family history.   Past Surgical History:  Procedure Laterality Date   BIOPSY  07/24/2022   Procedure: BIOPSY;  Surgeon: Rollin Dover, MD;  Location: [facility];  Service: Gastroenterology;;   BIOPSY  05/14/2023   Procedure: BIOPSY;  Surgeon: Rollin Dover, MD;  Location: [facility];  Service: Gastroenterology;;  BIOPSY OF SKIN SUBCUTANEOUS TISSUE AND/OR MUCOUS MEMBRANE  09/17/2023   Procedure: BIOPSY, SKIN, SUBCUTANEOUS TISSUE, OR MUCOUS MEMBRANE;  Surgeon: Rollin Dover, MD;  Location: [facility];  Service: Gastroenterology;;   COLONOSCOPY WITH PROPOFOL  N/A 07/24/2022   Procedure: COLONOSCOPY WITH PROPOFOL ;  Surgeon: Rollin Dover, MD;  Location: [facility];  Service: Gastroenterology;  Laterality: N/A;   CORONARY STENT INTERVENTION N/A 12/23/2021   Procedure: CORONARY STENT INTERVENTION;  Surgeon: Ladona Heinz, MD;  [facility]: [facility];  Service: Cardiovascular;  Laterality: N/A;   FLEXIBLE SIGMOIDOSCOPY N/A 05/14/2023   Procedure: FLEXIBLE SIGMOIDOSCOPY;  Surgeon: Rollin Dover, MD;  Location: [facility];  Service: Gastroenterology;  Laterality: N/A;   FLEXIBLE SIGMOIDOSCOPY N/A 09/17/2023   Procedure: KINGSTON SIDE;  Surgeon: Rollin Dover, MD;  Location: [facility];  Service: Gastroenterology;  Laterality: N/A;   LEFT HEART CATH AND CORONARY ANGIOGRAPHY N/A 11/19/2021   Procedure: LEFT HEART CATH AND CORONARY ANGIOGRAPHY;  Surgeon: Elmira Newman PARAS, MD;  Location: [facility];  Service: Cardiovascular;  Laterality: N/A;   LEFT HEART CATHETERIZATION WITH CORONARY ANGIOGRAM N/A 01/15/2014   Procedure: LEFT HEART CATHETERIZATION WITH CORONARY ANGIOGRAM;  Surgeon: Ozell JONETTA Fell, MD;  Location: [facility] CATH [facility];  Service: Cardiovascular;  Laterality: N/A;   PORTACATH PLACEMENT N/A 08/18/2022   Procedure: INSERTION PORT-A-CATH;  Surgeon: Teresa Lonni HERO, MD;   Location: [facility];  Service: General;  Laterality: N/A;  60    Social History   Socioeconomic History   Marital status: Married    Spouse name: Not on file   Number of children: 1   Years of education: Not on file   Highest education level: Not on file  Occupational History   Not on file  Tobacco Use   Smoking status: Every Day    Types: Cigars   Smokeless tobacco: Never   Tobacco comments:    Smokes 3-5 cigars daily.- khj 01/05/2024  Vaping Use   Vaping status: Never Used  Substance and Sexual Activity   Alcohol use: Yes    Comment: beer occ   Drug use: No   Sexual activity: Not on file  Other Topics Concern   Not on file  Social History Narrative   Works in [employer]   Social Drivers of Health   Financial Resource Strain: Low Risk  (08/06/2022)   Overall Financial Resource Strain (CARDIA)    Difficulty of Paying Living Expenses: Not hard at all  Food Insecurity: No Food Insecurity (09/15/2022)   Hunger Vital Sign    Worried About Running Out of Food in the Last Year: Never true    Ran Out of Food in the Last Year: Never true  Transportation Needs: No Transportation Needs (09/15/2022)   PRAPARE - Administrator, Civil Service (Medical): No    Lack of Transportation (Non-Medical): No  Physical Activity: Not on file  Stress: No Stress Concern Present (08/06/2022)   [employer] of Occupational Health - Occupational Stress Questionnaire    Feeling of Stress : Not at all  Social Connections: Not on file  Intimate Partner Violence: Not At Risk (09/15/2022)   Humiliation, Afraid, Rape, and Kick questionnaire    Fear of Current or Ex-Partner: No    Emotionally Abused: No    Physically Abused: No    Sexually Abused: No     Allergies  Allergen Reactions   Angiotensin Receptor Blockers Diarrhea   Beta Adrenergic Blockers Other (See Comments)    Fatigue   Zestril  [  Lisinopril ] Rash     CBC    Component Value Date/Time   WBC 4.3 05/25/2023 0929    WBC 6.0 08/18/2022 0648   RBC 3.52 (L) 05/25/2023 0929   HGB 11.9 (L) 05/25/2023 0929   HGB 12.8 (L) 03/12/2021 0816   HCT 33.6 (L) 05/25/2023 0929   HCT 37.5 03/12/2021 0816   PLT 147 (L) 05/25/2023 0929   PLT 205 03/12/2021 0816   MCV 95.5 05/25/2023 0929   MCV 93 03/12/2021 0816   MCH 33.8 05/25/2023 0929   MCHC 35.4 05/25/2023 0929   RDW 13.4 05/25/2023 0929   RDW 12.7 03/12/2021 0816   LYMPHSABS 1.0 05/25/2023 0929   MONOABS 0.5 05/25/2023 0929   EOSABS 0.1 05/25/2023 0929   BASOSABS 0.0 05/25/2023 0929    Pulmonary Functions Testing Results:     No data to display          Outpatient Medications Prior to Visit  Medication Sig Dispense Refill   amLODipine  (NORVASC ) 5 MG tablet Take 1 tablet (5 mg total) by mouth daily at 10 pm. 30 tablet 3   aspirin  81 MG EC tablet Take 1 tablet (81 mg total) by mouth daily. 30 tablet 3   atorvastatin  (LIPITOR ) 80 MG tablet Take 1 tablet (80 mg total) by mouth at bedtime. 90 tablet 3   cloNIDine  (CATAPRES ) 0.3 MG tablet Take 1 tablet (0.3 mg total) by mouth 2 (two) times daily. 180 tablet 3   diphenhydramine-acetaminophen  (TYLENOL  PM) 25-500 MG TABS tablet Take 2 tablets by mouth at bedtime.     Docusate Sodium (COLACE PO) Take 2 capsules by mouth 2 (two) times daily.     DULoxetine (CYMBALTA) 30 MG capsule Take 30 mg by mouth daily.     ezetimibe  (ZETIA ) 10 MG tablet Take 1 tablet (10 mg total) by mouth daily. 100 tablet 3   isosorbide  mononitrate (IMDUR ) 30 MG 24 hr tablet Take 1 tablet (30 mg total) by mouth every morning. 90 tablet 1   lidocaine -prilocaine  (EMLA ) cream Apply 1 Application topically as needed (Use as directed to apply to port area 1-2 hours prior to appt). 30 g 0   MAGNESIUM  PO Take 1 tablet by mouth at bedtime.     metoprolol  succinate (TOPROL -XL) 25 MG 24 hr tablet Take 1 tablet (25 mg total) by mouth daily. 90 tablet 3   nitroGLYCERIN  (NITROSTAT ) 0.4 MG SL tablet Place 1 tablet (0.4 mg total) under the tongue  every 5 (five) minutes x 3 doses as needed for chest pain. 25 tablet 2   Omega-3 Fatty Acids (FISH OIL PO) Take 1 capsule by mouth daily.     omeprazole  (PRILOSEC  OTC) 20 MG tablet Take 1 tablet (20 mg total) by mouth daily. 30 tablet 3   OVER THE COUNTER MEDICATION Take 5 capsules by mouth daily at 6 (six) AM.     potassium chloride  SA (KLOR-CON  M) 20 MEQ tablet Take 1 tablet (20 mEq total) by mouth 2 (two) times daily. 60 tablet 2   predniSONE (DELTASONE) 20 MG tablet Take 20 mg by mouth as needed (it is his friends prescription).     tamsulosin  (FLOMAX ) 0.4 MG CAPS capsule Take 0.4 mg by mouth 2 (two) times daily.     TURMERIC PO Take 2 tablets by mouth daily.     loperamide  (IMODIUM  A-D) 2 MG tablet Take 1-2 tablets (2-4 mg total) by mouth 4 (four) times daily as needed for diarrhea or loose stools (Up to 8tabs/day). (Patient not  taking: Reported on 01/05/2024)     meloxicam  (MOBIC ) 15 MG tablet Take 1 tablet (15 mg total) by mouth daily for 14 days. (Patient not taking: Reported on 01/05/2024) 14 tablet 0   ondansetron  (ZOFRAN ) 8 MG tablet TAKE ONE TABLET BY MOUTH EVERY 8 HOURS AS NEEDED (MAY USE FOR NAUSEA 3 DAYS AFTER CHEMO) (Patient not taking: Reported on 01/05/2024) 30 tablet 1   polyethylene glycol (MIRALAX / GLYCOLAX) 17 g packet Take 17 g by mouth 2 (two) times daily. (Patient not taking: Reported on 01/05/2024)     Probiotic Product (RESTORA) CAPS Take 1 capsule by mouth daily. (Patient not taking: Reported on 01/05/2024)     prochlorperazine  (COMPAZINE ) 10 MG tablet Take 1 tablet (10 mg total) by mouth every 6 (six) hours as needed for nausea or vomiting. (Patient not taking: Reported on 01/05/2024) 30 tablet 0   Facility-Administered Medications Prior to Visit  Medication Dose Route Frequency Provider Last Rate Last Admin   sodium chloride  flush (NS) 0.9 % injection 10 mL  10 mL Intravenous PRN Cloretta Arley NOVAK, MD   10 mL at 09/21/22 [phone]

## 2024-01-05 NOTE — Progress Notes (Signed)
 Synopsis: Referred in for lung nodule by Ethelda Jordis SAUNDERS, MD  Assessment & Plan:   #Lung nodule (Primary) #Rectal Cancer  Nodule Location: RLL Nodule Size: 11 mm Nodule Spiculation: No Associated Lymphadenopathy: No Smoking Status (current) Extrathoracic cancer > 5 years prior (yes): Rectal cancer ECOG: 0  The patient is here to discuss their imaging abnormalities which includes multiple pulmonary nodules that show FDG avidity on PET/CT. 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 We did discuss this during our visit today and I have also reached out to the patient's primary oncologist. Patient would like to establish an etiology behind said nodules and would like to proceed with getting a biopsy done.  We discussed the importance of diagnosis and staging in lung malignancies, and the approach to obtaining a tissue diagnosis which would include robotic assisted navigational bronchoscopy with endobronchial ultrasound guided sampling.  We also discussed the risks associated with the procedure which include a 2% risk of pneumothorax, infection, bleeding, and nondiagnostic procedure in detail.  I explained that patients typically are able to return home the same day of the procedure, but in rare cases admission to the hospital for observation and treatment is required.  After our discussion, the patient elected to proceed with the procedure  Recommendations:  - CT SUPER D CHEST WO CONTRAST; Future - Procedural/ Surgical Case Request: VIDEO BRONCHOSCOPY WITH ENDOBRONCHIAL NAVIGATION; Future    I spent 60 minutes caring for this patient today, including preparing to see the patient, obtaining a medical history , reviewing a separately obtained history, performing a medically appropriate examination and/or evaluation, counseling and educating the patient/family/caregiver,  ordering medications, tests, or procedures, documenting clinical information in the electronic health record, and independently interpreting results (not separately reported/billed) and communicating results to the patient/family/caregiver  Belva November, MD [facility]   End of visit medications:  No orders of the defined types were placed in this encounter.    Current Outpatient Medications:    amLODipine  (NORVASC ) 5 MG tablet, Take 1 tablet (5 mg total) by mouth daily at 10 pm., Disp: 30 tablet, Rfl: 3   aspirin  81 MG EC tablet, Take 1 tablet (81 mg total) by mouth daily., Disp: 30 tablet, Rfl: 3   atorvastatin  (LIPITOR ) 80 MG tablet, Take 1 tablet (80 mg total) by mouth at bedtime., Disp: 90 tablet, Rfl: 3   cloNIDine  (CATAPRES ) 0.3 MG tablet, Take 1 tablet (0.3 mg total) by mouth 2 (two) times daily., Disp: 180 tablet, Rfl: 3   diphenhydramine-acetaminophen  (TYLENOL  PM) 25-500 MG TABS tablet, Take 2 tablets by mouth at bedtime., Disp: , Rfl:    Docusate Sodium (COLACE PO), Take 2 capsules by mouth 2 (two) times daily., Disp: , Rfl:    DULoxetine (CYMBALTA) 30 MG capsule, Take 30 mg by mouth daily., Disp: , Rfl:    ezetimibe  (ZETIA ) 10 MG tablet, Take 1 tablet (10 mg total) by mouth daily., Disp: 100 tablet, Rfl: 3   isosorbide  mononitrate (IMDUR ) 30 MG 24 hr tablet, Take 1 tablet (30 mg total) by mouth every morning., Disp: 90 tablet, Rfl: 1   lidocaine -prilocaine  (EMLA ) cream, Apply 1 Application topically as needed (Use as directed to apply to port area 1-2 hours prior to appt)., Disp: 30 g, Rfl: 0   MAGNESIUM  PO, Take 1 tablet by mouth at bedtime., Disp: , Rfl:    metoprolol  succinate (TOPROL -XL) 25 MG 24 hr tablet, Take  1 tablet (25 mg total) by mouth daily., Disp: 90 tablet, Rfl: 3   nitroGLYCERIN  (NITROSTAT ) 0.4 MG SL tablet, Place 1 tablet (0.4 mg total) under the tongue every 5 (five) minutes x 3 doses as needed for chest pain., Disp: 25 tablet, Rfl: 2    Omega-3 Fatty Acids (FISH OIL PO), Take 1 capsule by mouth daily., Disp: , Rfl:    omeprazole  (PRILOSEC  OTC) 20 MG tablet, Take 1 tablet (20 mg total) by mouth daily., Disp: 30 tablet, Rfl: 3   OVER THE COUNTER MEDICATION, Take 5 capsules by mouth daily at 6 (six) AM., Disp: , Rfl:    potassium chloride  SA (KLOR-CON  M) 20 MEQ tablet, Take 1 tablet (20 mEq total) by mouth 2 (two) times daily., Disp: 60 tablet, Rfl: 2   predniSONE (DELTASONE) 20 MG tablet, Take 20 mg by mouth as needed (it is his friends prescription)., Disp: , Rfl:    tamsulosin  (FLOMAX ) 0.4 MG CAPS capsule, Take 0.4 mg by mouth 2 (two) times daily., Disp: , Rfl:    TURMERIC PO, Take 2 tablets by mouth daily., Disp: , Rfl:    loperamide  (IMODIUM  A-D) 2 MG tablet, Take 1-2 tablets (2-4 mg total) by mouth 4 (four) times daily as needed for diarrhea or loose stools (Up to 8tabs/day). (Patient not taking: Reported on 01/05/2024), Disp: , Rfl:    meloxicam  (MOBIC ) 15 MG tablet, Take 1 tablet (15 mg total) by mouth daily for 14 days. (Patient not taking: Reported on 01/05/2024), Disp: 14 tablet, Rfl: 0   ondansetron  (ZOFRAN ) 8 MG tablet, TAKE ONE TABLET BY MOUTH EVERY 8 HOURS AS NEEDED (MAY USE FOR NAUSEA 3 DAYS AFTER CHEMO) (Patient not taking: Reported on 01/05/2024), Disp: 30 tablet, Rfl: 1   polyethylene glycol (MIRALAX / GLYCOLAX) 17 g packet, Take 17 g by mouth 2 (two) times daily. (Patient not taking: Reported on 01/05/2024), Disp: , Rfl:    Probiotic Product (RESTORA) CAPS, Take 1 capsule by mouth daily. (Patient not taking: Reported on 01/05/2024), Disp: , Rfl:    prochlorperazine  (COMPAZINE ) 10 MG tablet, Take 1 tablet (10 mg total) by mouth every 6 (six) hours as needed for nausea or vomiting. (Patient not taking: Reported on 01/05/2024), Disp: 30 tablet, Rfl: 0 No current facility-administered medications for this visit.  Facility-Administered Medications Ordered in Other Visits:    sodium chloride  flush (NS) 0.9 % injection 10 mL, 10 mL,  Intravenous, PRN, Cloretta Arley NOVAK, MD, 10 mL at 09/21/22 9053   Subjective:   PATIENT ID: Ronald [name] GENDER: male DOB: [birth year], MRN: [id number]  Chief Complaint  Patient presents with   Lung Mass    HPI  Patient is a pleasant 78 year old male with a history of rectal cancer followed by Dr. Cloretta presenting to clinic for the evaluation of a pulmonary nodule.  Patient was diagnosed with rectal cancer in March 2024 after a mass in the rectum was noted on colonoscopy. This was biopsied and showed moderately differentiated invasive adenocarcinoma.  He received neoadjuvant therapy with FOLFOX followed by neoadjuvant radiation therapy.  Repeat flexible sigmoidoscopy with biopsy of rectal scar did not show any malignancy. He then underwent restaging to evaluate candidacy for surgical resection. He was seen by surgery in January 2025 and given patient preference as well as multidisciplinary tumor board review, consensus was for a watch and wait approach with close surveillance given appearance of remission on imaging. Of note, his imaging workup was notable for for small pulmonary nodules that were suspicious  for metastasis.  Patient recently underwent a CT scan of the chest in June 2025 that showed progression of pulmonary nodules with enlargement of dominant right lower lobe pulmonary nodule.  He also had a PET scan in July 2025 that showed the pulmonary nodules to have FDG avidity, with the right lower lobe pulmonary nodule showing an SUV of 3.3.  The right upper lobe pulmonary nodule measuring 5 mm also showed mild FDG avidity with a SUV of 2.4.  Today the patient reports being in his usual state of health. He knows he is here to discuss his pulmonary nodule but is unable to elaborate further.  He reports that he was referred to us  by his oncologist at the [facility].  He has two oncologists (at [place] and at the [facility]). He has no respiratory symptoms and denies any shortness of breath, cough,  sputum production, hemoptysis, fevers, chills, night sweats, or weight loss.  Patient has a history of smoking, mostly smoking 2 to 3 cigars a day. He reports that smoking cigars helps with his PTSD.  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  He was also at [employer]  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  Past medical History notable for CAD, hypertension, hyperlipidemia, and colon cancer.  Ancillary information including prior medications, full medical/surgical/family/social histories, and PFTs (when available) are listed below and have been reviewed.   Review of Systems  Constitutional:  Negative for chills, fever and weight loss.  Respiratory:  Negative for cough, hemoptysis, shortness of breath and wheezing.   Cardiovascular:  Negative for chest pain.     Objective:   Vitals:   01/05/24 1042  BP: 118/60  Pulse: 84  Temp: (!) 97.1 F (36.2 C)  SpO2: 97%  Weight: 169 lb 12.8 oz (77 kg)  Height: 5' 8 (1.727 m)   97% on RA BMI Readings from Last 3 Encounters:  01/05/24 25.82 kg/m  12/29/23 25.27 kg/m  11/29/23 25.64 kg/m   Wt Readings from Last 3 Encounters:  01/05/24 169 lb 12.8 oz (77 kg)  12/29/23 166 lb 3.2 oz (75.4 kg)  11/29/23 168 lb 9.6 oz (76.5 kg)    Physical Exam Constitutional:      Appearance: Normal appearance.  Cardiovascular:     Rate and Rhythm: Normal rate and regular rhythm.     Pulses: Normal pulses.     Heart sounds: Normal heart sounds.  Pulmonary:     Effort: Pulmonary effort is normal.     Breath sounds: Normal breath sounds.  Abdominal:     Palpations: Abdomen is soft.  Neurological:     General: No focal deficit present.     Mental Status: He is alert and oriented to person, place, and time. Mental status is at baseline.       Ancillary Information    Past Medical History:  Diagnosis Date   Bronchitis    Cancer [facility])    colon   History of kidney stones     Hyperlipidemia    Hypertension    Myocardial infarction [facility])      History reviewed. No pertinent family history.   Past Surgical History:  Procedure Laterality Date   BIOPSY  07/24/2022   Procedure: BIOPSY;  Surgeon: Rollin Dover, MD;  Location: [facility];  Service: Gastroenterology;;   BIOPSY  05/14/2023   Procedure: BIOPSY;  Surgeon: Rollin Dover, MD;  Location: [facility];  Service: Gastroenterology;;  BIOPSY OF SKIN SUBCUTANEOUS TISSUE AND/OR MUCOUS MEMBRANE  09/17/2023   Procedure: BIOPSY, SKIN, SUBCUTANEOUS TISSUE, OR MUCOUS MEMBRANE;  Surgeon: Rollin Dover, MD;  Location: [facility];  Service: Gastroenterology;;   COLONOSCOPY WITH PROPOFOL  N/A 07/24/2022   Procedure: COLONOSCOPY WITH PROPOFOL ;  Surgeon: Rollin Dover, MD;  Location: [facility];  Service: Gastroenterology;  Laterality: N/A;   CORONARY STENT INTERVENTION N/A 12/23/2021   Procedure: CORONARY STENT INTERVENTION;  Surgeon: Ladona Heinz, MD;  [facility];  Service: Cardiovascular;  Laterality: N/A;   FLEXIBLE SIGMOIDOSCOPY N/A 05/14/2023   Procedure: FLEXIBLE SIGMOIDOSCOPY;  Surgeon: Rollin Dover, MD;  Location: [facility];  Service: Gastroenterology;  Laterality: N/A;   FLEXIBLE SIGMOIDOSCOPY N/A 09/17/2023   Procedure: KINGSTON SIDE;  Surgeon: Rollin Dover, MD;  Location: [facility];  Service: Gastroenterology;  Laterality: N/A;   LEFT HEART CATH AND CORONARY ANGIOGRAPHY N/A 11/19/2021   Procedure: LEFT HEART CATH AND CORONARY ANGIOGRAPHY;  Surgeon: Elmira Newman PARAS, MD;  Location: [facility];  Service: Cardiovascular;  Laterality: N/A;   LEFT HEART CATHETERIZATION WITH CORONARY ANGIOGRAM N/A 01/15/2014   Procedure: LEFT HEART CATHETERIZATION WITH CORONARY ANGIOGRAM;  Surgeon: Ozell JONETTA Fell, MD;  Location: [facility] CATH [facility];  Service: Cardiovascular;  Laterality: N/A;   PORTACATH PLACEMENT N/A 08/18/2022   Procedure: INSERTION PORT-A-CATH;  Surgeon: Teresa Lonni HERO, MD;   Location: [facility];  Service: General;  Laterality: N/A;  60    Social History   Socioeconomic History   Marital status: Married    Spouse name: Not on file   Number of children: 1   Years of education: Not on file   Highest education level: Not on file  Occupational History   Not on file  Tobacco Use   Smoking status: Every Day    Types: Cigars   Smokeless tobacco: Never   Tobacco comments:    Smokes 3-5 cigars daily.- khj 01/05/2024  Vaping Use   Vaping status: Never Used  Substance and Sexual Activity   Alcohol use: Yes    Comment: beer occ   Drug use: No   Sexual activity: Not on file  Other Topics Concern   Not on file  Social History Narrative   Works in [employer]   Social Drivers of Health   Financial Resource Strain: Low Risk  (08/06/2022)   Overall Financial Resource Strain (CARDIA)    Difficulty of Paying Living Expenses: Not hard at all  Food Insecurity: No Food Insecurity (09/15/2022)   Hunger Vital Sign    Worried About Running Out of Food in the Last Year: Never true    Ran Out of Food in the Last Year: Never true  Transportation Needs: No Transportation Needs (09/15/2022)   PRAPARE - Administrator, Civil Service (Medical): No    Lack of Transportation (Non-Medical): No  Physical Activity: Not on file  Stress: No Stress Concern Present (08/06/2022)   [employer] of Occupational Health - Occupational Stress Questionnaire    Feeling of Stress : Not at all  Social Connections: Not on file  Intimate Partner Violence: Not At Risk (09/15/2022)   Humiliation, Afraid, Rape, and Kick questionnaire    Fear of Current or Ex-Partner: No    Emotionally Abused: No    Physically Abused: No    Sexually Abused: No     Allergies  Allergen Reactions   Angiotensin Receptor Blockers Diarrhea   Beta Adrenergic Blockers Other (See Comments)    Fatigue   Zestril  [  Lisinopril ] Rash     CBC    Component Value Date/Time   WBC 4.3 05/25/2023 0929    WBC 6.0 08/18/2022 0648   RBC 3.52 (L) 05/25/2023 0929   HGB 11.9 (L) 05/25/2023 0929   HGB 12.8 (L) 03/12/2021 0816   HCT 33.6 (L) 05/25/2023 0929   HCT 37.5 03/12/2021 0816   PLT 147 (L) 05/25/2023 0929   PLT 205 03/12/2021 0816   MCV 95.5 05/25/2023 0929   MCV 93 03/12/2021 0816   MCH 33.8 05/25/2023 0929   MCHC 35.4 05/25/2023 0929   RDW 13.4 05/25/2023 0929   RDW 12.7 03/12/2021 0816   LYMPHSABS 1.0 05/25/2023 0929   MONOABS 0.5 05/25/2023 0929   EOSABS 0.1 05/25/2023 0929   BASOSABS 0.0 05/25/2023 0929    Pulmonary Functions Testing Results:     No data to display          Outpatient Medications Prior to Visit  Medication Sig Dispense Refill   amLODipine  (NORVASC ) 5 MG tablet Take 1 tablet (5 mg total) by mouth daily at 10 pm. 30 tablet 3   aspirin  81 MG EC tablet Take 1 tablet (81 mg total) by mouth daily. 30 tablet 3   atorvastatin  (LIPITOR ) 80 MG tablet Take 1 tablet (80 mg total) by mouth at bedtime. 90 tablet 3   cloNIDine  (CATAPRES ) 0.3 MG tablet Take 1 tablet (0.3 mg total) by mouth 2 (two) times daily. 180 tablet 3   diphenhydramine-acetaminophen  (TYLENOL  PM) 25-500 MG TABS tablet Take 2 tablets by mouth at bedtime.     Docusate Sodium (COLACE PO) Take 2 capsules by mouth 2 (two) times daily.     DULoxetine (CYMBALTA) 30 MG capsule Take 30 mg by mouth daily.     ezetimibe  (ZETIA ) 10 MG tablet Take 1 tablet (10 mg total) by mouth daily. 100 tablet 3   isosorbide  mononitrate (IMDUR ) 30 MG 24 hr tablet Take 1 tablet (30 mg total) by mouth every morning. 90 tablet 1   lidocaine -prilocaine  (EMLA ) cream Apply 1 Application topically as needed (Use as directed to apply to port area 1-2 hours prior to appt). 30 g 0   MAGNESIUM  PO Take 1 tablet by mouth at bedtime.     metoprolol  succinate (TOPROL -XL) 25 MG 24 hr tablet Take 1 tablet (25 mg total) by mouth daily. 90 tablet 3   nitroGLYCERIN  (NITROSTAT ) 0.4 MG SL tablet Place 1 tablet (0.4 mg total) under the tongue  every 5 (five) minutes x 3 doses as needed for chest pain. 25 tablet 2   Omega-3 Fatty Acids (FISH OIL PO) Take 1 capsule by mouth daily.     omeprazole  (PRILOSEC  OTC) 20 MG tablet Take 1 tablet (20 mg total) by mouth daily. 30 tablet 3   OVER THE COUNTER MEDICATION Take 5 capsules by mouth daily at 6 (six) AM.     potassium chloride  SA (KLOR-CON  M) 20 MEQ tablet Take 1 tablet (20 mEq total) by mouth 2 (two) times daily. 60 tablet 2   predniSONE (DELTASONE) 20 MG tablet Take 20 mg by mouth as needed (it is his friends prescription).     tamsulosin  (FLOMAX ) 0.4 MG CAPS capsule Take 0.4 mg by mouth 2 (two) times daily.     TURMERIC PO Take 2 tablets by mouth daily.     loperamide  (IMODIUM  A-D) 2 MG tablet Take 1-2 tablets (2-4 mg total) by mouth 4 (four) times daily as needed for diarrhea or loose stools (Up to 8tabs/day). (Patient not  taking: Reported on 01/05/2024)     meloxicam  (MOBIC ) 15 MG tablet Take 1 tablet (15 mg total) by mouth daily for 14 days. (Patient not taking: Reported on 01/05/2024) 14 tablet 0   ondansetron  (ZOFRAN ) 8 MG tablet TAKE ONE TABLET BY MOUTH EVERY 8 HOURS AS NEEDED (MAY USE FOR NAUSEA 3 DAYS AFTER CHEMO) (Patient not taking: Reported on 01/05/2024) 30 tablet 1   polyethylene glycol (MIRALAX / GLYCOLAX) 17 g packet Take 17 g by mouth 2 (two) times daily. (Patient not taking: Reported on 01/05/2024)     Probiotic Product (RESTORA) CAPS Take 1 capsule by mouth daily. (Patient not taking: Reported on 01/05/2024)     prochlorperazine  (COMPAZINE ) 10 MG tablet Take 1 tablet (10 mg total) by mouth every 6 (six) hours as needed for nausea or vomiting. (Patient not taking: Reported on 01/05/2024) 30 tablet 0   Facility-Administered Medications Prior to Visit  Medication Dose Route Frequency Provider Last Rate Last Admin   sodium chloride  flush (NS) 0.9 % injection 10 mL  10 mL Intravenous PRN Cloretta Arley NOVAK, MD   10 mL at 09/21/22 [phone]

## 2024-01-10 ENCOUNTER — Other Ambulatory Visit

## 2024-01-11 NOTE — Telephone Encounter (Signed)
 Patient has Medicare A Ronald [name] [place]arrows [id number] valid 12/14/23 to 12/14/2024 per Eleanor Clara Prior Auth Not Required. I had to leave a message for the patient about his CT appt.

## 2024-01-14 NOTE — Telephone Encounter (Signed)
 I had to leave a message not sure if he got it or not

## 2024-01-17 ENCOUNTER — Inpatient Hospital Stay: Admission: RE | Admit: 2024-01-17 | Source: Ambulatory Visit

## 2024-01-18 ENCOUNTER — Ambulatory Visit (HOSPITAL_BASED_OUTPATIENT_CLINIC_OR_DEPARTMENT_OTHER)
Admission: RE | Admit: 2024-01-18 | Discharge: 2024-01-18 | Disposition: A | Discharge status: Home / Self Care | Source: Ambulatory Visit | Attending: Oncology | Admitting: Oncology

## 2024-01-18 DIAGNOSIS — C20 Malignant neoplasm of rectum: Secondary | ICD-10-CM | POA: Diagnosis present

## 2024-01-18 NOTE — Telephone Encounter (Signed)
 The patient did have his CT done today.  Nothing further needed.

## 2024-01-19 ENCOUNTER — Telehealth: Payer: Self-pay

## 2024-01-19 ENCOUNTER — Other Ambulatory Visit: Payer: Self-pay

## 2024-01-19 ENCOUNTER — Encounter
Admission: RE | Admit: 2024-01-19 | Discharge: 2024-01-19 | Disposition: A | Discharge status: Home / Self Care | Source: Ambulatory Visit | Attending: Student in an Organized Health Care Education/Training Program | Admitting: Student in an Organized Health Care Education/Training Program

## 2024-01-19 DIAGNOSIS — I10 Essential (primary) hypertension: Secondary | ICD-10-CM

## 2024-01-19 DIAGNOSIS — R911 Solitary pulmonary nodule: Secondary | ICD-10-CM

## 2024-01-19 DIAGNOSIS — Z01812 Encounter for preprocedural laboratory examination: Secondary | ICD-10-CM

## 2024-01-19 HISTORY — DX: Atherosclerotic heart disease of native coronary artery without angina pectoris: I25.10

## 2024-01-19 HISTORY — DX: Unspecified osteoarthritis, unspecified site: M19.90

## 2024-01-19 NOTE — Telephone Encounter (Signed)
       Pre-operative Risk Assessment    Patient Name: Ronald [name]  DOB: 11/07/1945 MRN: [id number]   Date of last office visit: 07/07/23 GORDY BERGAMO, MD Date of next office visit: NONE   Request for Surgical Clearance    Procedure:  VIDEO BRONCHOSCOPY WITH ENDOBRONCHIAL NAVIGATION  Date of Surgery:  Clearance 01/25/24                                Surgeon:  Dr. Belva November  Surgeon's Group or Practice Name:  [facility]  Phone number:  [phone]  Fax number:  [phone] (In efforts to conserve resources (paper), return FAX not required. I will follow up in [facility]).    Type of Clearance Requested:   - Medical  - Pharmacy:  Hold Aspirin  Per request (patient to continue daily LOW DOSE ASPIRIN  throughout the perioperative course )   Type of Anesthesia:  General    Additional requests/questions:    Signed, Lucie DELENA Ku   01/19/2024, 4:16 PM

## 2024-01-19 NOTE — Telephone Encounter (Signed)
-----   Message from Dorise CHARLENA Pereyra sent at 01/19/2024  4:13 PM EDT ----- Regarding: Request for pre-operative cardiac clearance Request for pre-operative cardiac clearance:  1. What type of surgery is being performed?  VIDEO BRONCHOSCOPY WITH ENDOBRONCHIAL NAVIGATION  2. When is this surgery scheduled?  01/25/2024  3. Type of clearance being requested (medical, pharmacy, both)? MEDICAL   4. Are there any medications that need to be held prior to surgery? N/A - patient to continue daily LOW DOSE ASPIRIN  throughout the perioperative course  5. Practice name and name of physician performing surgery?  Performing surgeon: Dr. Belva November, MD Requesting clearance: Dorise Pereyra, FNP-C    6. Anesthesia type (none, local, MAC, general)? GENERAL  7. What is the office phone and fax number?   Phone: [phone] Fax: [phone] (In efforts to conserve resources (paper), return FAX not required. I will follow up in [facility]).  ATTENTION: Unable to create telephone message as per your standard workflow. Directed by [facility] providers to send requests for cardiac clearance to this pool for appropriate distribution to provider covering pre-operative clearances.   Dorise Pereyra, MSN, APRN, FNP-C, CEN [facility]  Peri-operative Services Nurse Practitioner Phone: [phone] 01/19/24 4:13 PM

## 2024-01-19 NOTE — Patient Instructions (Addendum)
 Your procedure is scheduled on: 01/25/24 - Tuesday Report to the Registration Desk on the 1st floor of the [facility]. To find out your arrival time, please call [phone] between 1PM - 3PM on: 01/24/24 - Monday If your arrival time is 6:00 am, do not arrive before that time as the [facility] entrance doors do not open until 6:00 am.  REMEMBER: Instructions that are not followed completely may result in serious medical risk, up to and including death; or upon the discretion of your surgeon and anesthesiologist your surgery may need to be rescheduled.  Do not eat food or drink any liquids after midnight the night before surgery.  No gum chewing or hard candies.   One week prior to surgery: Stop Anti-inflammatories (NSAIDS) such as Advil, Aleve, Ibuprofen, Motrin, Naproxen, Naprosyn and Aspirin  based products such as Excedrin, Goody's Powder, BC Powder. You may continue to take Tylenol  if needed for pain up until the day of surgery.  Stop ANY OVER THE COUNTER supplements until after surgery.  ON THE DAY OF SURGERY ONLY TAKE THESE MEDICATIONS WITH SIPS OF WATER:  cloNIDine  (CATAPRES )  isosorbide  mononitrate  omeprazole  (PRILOSEC  OTC)  tamsulosin  (FLOMAX )  amLODipine  (NORVASC )    No Alcohol for 24 hours before or after surgery.  No Smoking including e-cigarettes for 24 hours before surgery.  No chewable tobacco products for at least 6 hours before surgery.  No nicotine patches on the day of surgery.  Do not use any recreational drugs for at least a week (preferably 2 weeks) before your surgery.  Please be advised that the combination of cocaine and anesthesia may have negative outcomes, up to and including death. If you test positive for cocaine, your surgery will be cancelled.  On the morning of surgery brush your teeth with toothpaste and water, you may rinse your mouth with mouthwash if you wish. Do not swallow any toothpaste or mouthwash.  Do not wear jewelry,  make-up, hairpins, clips or nail polish.  For welded (permanent) jewelry: bracelets, anklets, waist bands, etc.  Please have this removed prior to surgery.  If it is not removed, there is a chance that hospital personnel will need to cut it off on the day of surgery.  Do not wear lotions, powders, or perfumes.   Do not shave body hair from the neck down 48 hours before surgery.  Contact lenses, hearing aids and dentures may not be worn into surgery.  Do not bring valuables to the hospital. [facility] is not responsible for any missing/lost belongings or valuables.   Notify your doctor if there is any change in your medical condition (cold, fever, infection).  Wear comfortable clothing (specific to your surgery type) to the hospital.  After surgery, you can help prevent lung complications by doing breathing exercises.  Take deep breaths and cough every 1-2 hours. Your doctor may order a device called an Incentive Spirometer to help you take deep breaths.  When coughing or sneezing, hold a pillow firmly against your incision with both hands. This is called "splinting." Doing this helps protect your incision. It also decreases belly discomfort.  If you are being admitted to the hospital overnight, leave your suitcase in the car. After surgery it may be brought to your room.  In case of increased patient census, it may be necessary for you, the patient, to continue your postoperative care in the Same Day Surgery department.  If you are being discharged the day of surgery, you will not be allowed  to drive home. You will need a responsible individual to drive you home and stay with you for 24 hours after surgery.   If you are taking public transportation, you will need to have a responsible individual with you.  Please call the Pre-admissions Testing Dept. at [phone] if you have any questions about these instructions.  Surgery Visitation Policy:  Patients having surgery or a  procedure may have two visitors.  Children under the age of 11 must have an adult with them who is not the patient.  Inpatient Visitation:    Visiting hours are 7 a.m. to 8 p.m. Up to four visitors are allowed at one time in a patient room. The visitors may rotate out with other people during the day.  One visitor age 65 or older may stay with the patient overnight and must be in the room by 8 p.m.   Merchandiser, retail to address health-related social needs:  [email or web address]

## 2024-01-20 ENCOUNTER — Telehealth: Payer: Self-pay

## 2024-01-20 ENCOUNTER — Encounter: Payer: Self-pay | Admitting: Student in an Organized Health Care Education/Training Program

## 2024-01-20 DIAGNOSIS — Z7982 Long term (current) use of aspirin: Secondary | ICD-10-CM

## 2024-01-20 NOTE — Telephone Encounter (Signed)
   Name: Ronald [name]  DOB: 1945-11-26  MRN: [id number]  Primary Cardiologist: Gordy Bergamo, MD  Chart reviewed as part of pre-operative protocol coverage. Because of Antar B Kohls's past medical history and time since last visit, he will require a follow-up telephone visit in order to better assess preoperative cardiovascular risk.  Pre-op covering staff: - Please schedule appointment and call patient to inform them. If patient already had an upcoming appointment within acceptable timeframe, please add pre-op clearance to the appointment notes so provider is aware. - Please contact requesting surgeon's office via preferred method (i.e, phone, fax) to inform them of need for appointment prior to surgery.  We would prefer aspirin  to be continued throughout video bronchoscopy but if surgeon feels the risk for bleeding is too high, as long as no symptoms, he can hold his aspirin  x 5 to 7 days prior to surgery.  Can resume when medically safe to do so.  Patient does live in [place] .  Orren LOISE Fabry, PA-C  01/20/2024, 7:57 AM

## 2024-01-20 NOTE — Telephone Encounter (Signed)
 Patient has been scheduled for televisit med rec and consent done     Patient Consent for Virtual Visit         Ronald [name] has provided verbal consent on 01/20/2024 for a virtual visit (video or telephone).   CONSENT FOR VIRTUAL VISIT FOR:  Ronald [name]  By participating in this virtual visit I agree to the following:  I hereby voluntarily request, consent and authorize [facility] and its employed or contracted physicians, physician assistants, nurse practitioners or other licensed health care professionals (the Practitioner), to provide me with telemedicine health care services (the "Services) as deemed necessary by the treating Practitioner. I acknowledge and consent to receive the Services by the Practitioner via telemedicine. I understand that the telemedicine visit will involve communicating with the Practitioner through live audiovisual communication technology and the disclosure of certain medical information by electronic transmission. I acknowledge that I have been given the opportunity to request an in-person assessment or other available alternative prior to the telemedicine visit and am voluntarily participating in the telemedicine visit.  I understand that I have the right to withhold or withdraw my consent to the use of telemedicine in the course of my care at any time, without affecting my right to future care or treatment, and that the Practitioner or I may terminate the telemedicine visit at any time. I understand that I have the right to inspect all information obtained and/or recorded in the course of the telemedicine visit and may receive copies of available information for a reasonable fee.  I understand that some of the potential risks of receiving the Services via telemedicine include:  Delay or interruption in medical evaluation due to technological equipment failure or disruption; Information transmitted may not be sufficient (e.g. poor resolution of  images) to allow for appropriate medical decision making by the Practitioner; and/or  In rare instances, security protocols could fail, causing a breach of personal health information.  Furthermore, I acknowledge that it is my responsibility to provide information about my medical history, conditions and care that is complete and accurate to the best of my ability. I acknowledge that Practitioner's advice, recommendations, and/or decision may be based on factors not within their control, such as incomplete or inaccurate data provided by me or distortions of diagnostic images or specimens that may result from electronic transmissions. I understand that the practice of medicine is not an exact science and that Practitioner makes no warranties or guarantees regarding treatment outcomes. I acknowledge that a copy of this consent can be made available to me via my patient portal [facility] MyChart), or I can request a printed copy by calling the office of [facility].    I understand that my insurance will be billed for this visit.   I have read or had this consent read to me. I understand the contents of this consent, which adequately explains the benefits and risks of the Services being provided via telemedicine.  I have been provided ample opportunity to ask questions regarding this consent and the Services and have had my questions answered to my satisfaction. I give my informed consent for the services to be provided through the use of telemedicine in my medical care

## 2024-01-20 NOTE — Telephone Encounter (Signed)
Patient has been scheduled for televisit.

## 2024-01-21 ENCOUNTER — Encounter: Payer: Self-pay | Admitting: Student in an Organized Health Care Education/Training Program

## 2024-01-21 ENCOUNTER — Telehealth: Payer: Self-pay | Admitting: Cardiology

## 2024-01-21 ENCOUNTER — Ambulatory Visit: Attending: Cardiology | Admitting: Physician Assistant

## 2024-01-21 DIAGNOSIS — Z01810 Encounter for preprocedural cardiovascular examination: Secondary | ICD-10-CM | POA: Diagnosis not present

## 2024-01-21 MED ORDER — CLONIDINE HCL 0.3 MG PO TABS: 0.3000 mg | ORAL_TABLET | Freq: Two times a day (BID) | ORAL | 1 refills | Status: DC

## 2024-01-21 MED ORDER — CLONIDINE HCL 0.3 MG PO TABS: 0.3000 mg | ORAL_TABLET | Freq: Two times a day (BID) | ORAL | 0 refills | Status: DC

## 2024-01-21 NOTE — Progress Notes (Signed)
 Perioperative / Anesthesia Services  Pre-Admission Testing Clinical Review / Pre-Operative Anesthesia Consult  Date: 01/21/24  PATIENT DEMOGRAPHICS: Name: Ronald [name] DOB: 13-Mar-1946 MRN:   [id number]  Note: Available PAT nursing documentation and vital signs have been reviewed. Clinical nursing staff has updated patient's PMH/PSHx, current medication list, and drug allergies/intolerances to ensure complete and comprehensive history available to assist care teams in MDM as it pertains to the aforementioned surgical procedure and anticipated anesthetic course. Extensive review of available clinical information personally performed. [place] PMH and PSHx updated with any diagnoses/procedures that  may have been inadvertently omitted during his intake with the pre-admission testing department's nursing staff.  PLANNED SURGICAL PROCEDURE(S):   Case: [id number] Date/Time: 01/25/24 0745   Procedure: VIDEO BRONCHOSCOPY WITH ENDOBRONCHIAL NAVIGATION (Bilateral)   Anesthesia type: General   Diagnosis: Lung nodule [R91.1]   Pre-op diagnosis: lung nodule   Location: [facility] PROCEDURE RM 02 / [facility]S FOR ANEST[facility]ESIA GROUP   Surgeons: Isadora Hose, MD        CLINICAL DISCUSSION: Ronald [name] is a 78 y.o. male who is submitted for pre-surgical anesthesia review and clearance prior to him undergoing the above procedure. Patient is a Current Smoker. Pertinent PMH includes: CAD, NSTEMI x 2, PVD, BILATERAL carotid artery disease, aortic atherosclerosis, unstable angina, HTN, HLD, COPD, multiple pulmonary nodules, adenocarcinoma of the rectum, prostatomegaly, OA, PTSD.  Patient is followed by cardiology Coleta, MD). He was last seen in the cardiology clinic on 07/07/2023; notes reviewed. At the time of his clinic visit, patient doing well overall from a cardiovascular perspective.  Patient with chronic exertional dyspnea that was reported to be stable and at baseline.  Dyspnea related to patient's  underlying COPD diagnosis. Patient denied any chest pain, PND, orthopnea, palpitations, significant peripheral edema, weakness, fatigue, vertiginous symptoms, or presyncope/syncope. Patient with a past medical history significant for cardiovascular diagnoses. Documented physical exam was grossly benign, providing no evidence of acute exacerbation and/or decompensation of the patient's known cardiovascular conditions.  Patient suffered an NSTEMI on 01/14/2014.  He underwent diagnostic LEFT heart catheterization the following day (01/15/2014) revealing multivessel CAD; 30-40% proximal LM, 30-40% mid LAD, 80% D1, 40-50% proximal to mid RCA, and 99% RPDA.  The IRA was determined to be the 99% lesion in the RPDA.  Interventional cardiology determined IRA was not amenable to PCI.  Medical management was recommended.  Patient suffered a second NSTEMI on 11/19/2021.    Diagnostic LEFT heart catheterization was performed revealing multivessel CAD; 20% mid LM, 40% proximal LAD, 80% lateral D1, 90% D1, and 80% RPDA.  Culprit felt to be small inferior branch stenosis in the diagonal that was too small to intervene.  Intervention was again deferred opting for medical management.  TTE performed on 11/20/2021 revealed a low normal left ventricular systolic function with an EF of 50-55%. There was mild LVH.  There were no regional wall motion abnormalities.  Left ventricular diastolic Doppler parameters were normal.  Right ventricular size and function normal with a TAPSE measuring 1.2 cm  (normal range >/= 1.6 cm).  There was mild mitral annular calcification.  There was review of tricuspid valve regurgitation.  All transvalvular gradients were noted to be normal providing no evidence of hemodynamically significant valvular stenosis. Aorta normal in size with no evidence of ectasia or aneurysmal dilatation.  Patient with ongoing unstable angina.  He underwent PCI procedure on 12/23/2021. PTCA of the D1 bifurcation/RI +  PTCA/PCI of D1 placing a 2.25 x 12 mm Onyx DES  x 1 was performed.  Procedure yielded excellent angiographic result and TIMI-3 flow.  Patient with a history of known carotid artery disease.  Most recent carotid Doppler study performed on 08/09/2023 revealed a 40-59% stenosis of the RICA with contralateral 1-39% stenosis noted in the LICA. Vertebral arteries demonstrated antegrade flow.  There were normal flow hemodynamics seen in the subclavian arteries.  Blood pressure well controlled at 134/66 mmHg on currently prescribed CCB (amlodipine ), alpha-blocker (clonidine ), nitrate (isosorbide  mononitrate) and beta-blocker (metoprolol  succinate) therapies.  In addition to his scheduled nitrates, patient has a supply of short acting nitrate therapy (NTG) to use on a as needed basis for recurrent angina/anginal equivalent symptoms.  Patient is on atorvastatin  for his HLD diagnosis and ASCVD prevention. Patient is not diabetic. He does not have an OSAH diagnosis. Patient is able to complete all of his  ADL/IADLs without cardiovascular limitation.  Per the DASI, patient is able to achieve at least 4 METS of physical activity without experiencing any significant degree of angina/anginal equivalent symptoms. No changes were made to his medication regimen during his visit with cardiology.  Patient scheduled to follow-up with outpatient cardiology in 12 months or sooner if needed.  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  He has been treated with concurrent neoadjuvant chemoradiation + surgical resection.  CT imaging of the chest on 10/18/2023 revealed progression of previously demonstrated radiographic findings to include multiple new pulmonary nodules and interval enlargement of the dominant 11 mm nodule within the superior segment of the RIGHT lower lobe.  PET CT scan performed on 11/15/2023 demonstrating multiple lung nodules with varying  levels of abnormal FDG uptake all giving rise to concern for pulmonary metastasis.  Maximum SUV in  the areas of concern in the chest was 3.3.  Patient was referred to pulmonary medicine for discussions regarding tissue sampling for diagnostic purposes.  Patient subsequently scheduled for VIDEO BRONCHOSCOPY WITH ENDOBRONCHIAL NAVIGATION on 01/25/2024 with Dr. Belva November, MD.   Given patient's past medical history significant for cardiovascular diagnoses, presurgical cardiac clearance was sought by the PAT team.  Per cardiology, Mr. Moede's perioperative risk of a major cardiac event is 0.9% according to the Revised Cardiac Risk Index (RCRI).  Therefore, he is at low risk for perioperative complications.   His functional capacity is good at 7.28 METs according to the [facility] Activity Status Index (DASI). According to ACC/AHA guidelines, no further cardiovascular testing needed.  The patient may proceed to surgery at ACCEPTABLE risk.  In review of the patient's chart, it is noted that he is on daily oral antithrombotic therapy. Given that patient's past medical history is significant for cardiovascular diagnoses, including but not limited to CAD, pulmonary Medicine has cleared patient to continue his daily low dose ASA throughout his perioperative course.  Patient has been updated on these directives from his specialty care providers by the PAT team.  Patient denies previous perioperative complications with anesthesia in the past. In review his EMR, it is noted that patient underwent a MAC anesthetic course here at [facility] ([facility]) in 09/2023 without documented complications.   MOST RECENT VITAL SIGNS:    01/05/2024   10:42 AM 12/29/2023   10:50 AM 11/29/2023    9:56 AM  Vitals with BMI  Height 5' 8 5' 8   Weight 169 lbs 13 oz 166 lbs 3 oz   BMI 25.82 25.28   Systolic 118 160  118  Diastolic 60 72 59  Pulse 84 83    PROVIDERS/SPECIALISTS: NOTE: Primary  physician provider listed below. Patient may have been seen by APP or partner within same practice.   PROVIDER ROLE / SPECIALTY LAST SHERLEAN Isadora Hose, MD Pulmonary Medicine (Surgeon) 01/05/2024  Virgia Dorothe BRAVO, MD Primary Care Provider ???  Ladona Heinz, MD Cardiology 07/07/2023; preop APP call 01/21/2024  Cloretta Kuba, MD Medical Oncology 11/16/2023  Dewey Rush, MD Radiation Oncology 03/17/2023   ALLERGIES: Allergies  Allergen Reactions   Angiotensin Receptor Blockers Diarrhea   Beta Adrenergic Blockers Other (See Comments)    Fatigue   Zestril  [Lisinopril ] Rash    CURRENT HOME MEDICATIONS: No current facility-administered medications for this encounter.    amLODipine  (NORVASC ) 5 MG tablet   aspirin  81 MG EC tablet   atorvastatin  (LIPITOR ) 80 MG tablet   cloNIDine  (CATAPRES ) 0.3 MG tablet   diphenhydramine-acetaminophen  (TYLENOL  PM) 25-500 MG TABS tablet   Docusate Sodium (COLACE PO)   DULoxetine (CYMBALTA) 30 MG capsule   ezetimibe  (ZETIA ) 10 MG tablet   isosorbide  mononitrate (IMDUR ) 30 MG 24 hr tablet   lidocaine -prilocaine  (EMLA ) cream   loperamide  (IMODIUM  A-D) 2 MG tablet   MAGNESIUM  PO   metoprolol  succinate (TOPROL -XL) 25 MG 24 hr tablet   nitroGLYCERIN  (NITROSTAT ) 0.4 MG SL tablet   Omega-3 Fatty Acids (FISH OIL PO)   omeprazole  (PRILOSEC  OTC) 20 MG tablet   ondansetron  (ZOFRAN ) 8 MG tablet   OVER THE COUNTER MEDICATION   polyethylene glycol (MIRALAX / GLYCOLAX) 17 g packet   potassium chloride  SA (KLOR-CON  M) 20 MEQ tablet   predniSONE (DELTASONE) 20 MG tablet   Probiotic Product (RESTORA) CAPS   prochlorperazine  (COMPAZINE ) 10 MG tablet   tamsulosin  (FLOMAX ) 0.4 MG CAPS capsule   TURMERIC PO    sodium chloride  flush (NS) 0.9 % injection 10 mL   HISTORY: Past Medical History:  Diagnosis Date   Adenocarcinoma of rectum (HCC) 07/24/2022   a.) Bx 07/24/2022 --> pathology (+) for invasive 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;  stage IIIc (T4N1M0); MSS stable, KRAS (+); Tx'd with neoadjuvant chemoradiation +  surgical resection   Alcohol use    Aortic atherosclerosis (HCC)    Arthritis    Bilateral carotid artery disease (HCC)    Cholelithiasis    Chronic post-traumatic stress disorder (PTSD) after military combat    Cigar smoker    COPD (chronic obstructive pulmonary disease) (HCC)    Coronary artery disease 01/15/2014   a.) s/p NSTEMI 11/19/2021 --> IRA 99% RPDA not amenable to PCI; b.) s/p NSTEMI 12/23/2021 --> LHC/PCI: PTCA D1 bifurcation/RI + PTCA/PCI D1 (2.25 x 12 mm Onyx DES)   Diverticulosis    Enlarged prostate    History of kidney stones    History of serving in [employer] ([place] veteran)    Hyperlipidemia    Hypertension    Long-term use of aspirin  therapy    NSTEMI (non-ST elevated myocardial infarction) (HCC) 11/19/2021   a.) LHC 11/19/2021: 20% mLM, 40% pLAD, 80% lateral D1, 90% D1, and 80% RPDA --> Rx mgmt   NSTEMI (non-ST elevated myocardial infarction) (HCC) 01/14/2014   a.) LHC 01/15/2014: 30-40% pLM, 30-40% mLAD, 80% D1, 40-50% p-mRCA, 99% RPDA  --> IRA was the RDPA, however it was small and not amenable to PCI --> Rx mgmt   Pulmonary nodules    PVD (peripheral vascular disease) (HCC)    Unstable angina (HCC)    Past Surgical History:  Procedure Laterality  Date   BIOPSY  07/24/2022   Procedure: BIOPSY;  Surgeon: Rollin Dover, MD;  Location: [facility];  Service: Gastroenterology;;   BIOPSY  05/14/2023   Procedure: BIOPSY;  Surgeon: Rollin Dover, MD;  Location: [facility];  Service: Gastroenterology;;   BIOPSY OF SKIN SUBCUTANEOUS TISSUE AND/OR MUCOUS MEMBRANE  09/17/2023   Procedure: BIOPSY, SKIN, SUBCUTANEOUS TISSUE, OR MUCOUS MEMBRANE;  Surgeon: Rollin Dover, MD;  Location: [facility];  Service: Gastroenterology;;   COLONOSCOPY WITH PROPOFOL  N/A 07/24/2022   Procedure: COLONOSCOPY WITH PROPOFOL ;  Surgeon: Rollin Dover, MD;  Location: [facility];  Service: Gastroenterology;   Laterality: N/A;   CORONARY STENT INTERVENTION N/A 12/23/2021   Procedure: CORONARY STENT INTERVENTION;  Surgeon: Ladona Heinz, MD;  Location: [facility];  Service: Cardiovascular;  Laterality: N/A;   FLEXIBLE SIGMOIDOSCOPY N/A 05/14/2023   Procedure: FLEXIBLE SIGMOIDOSCOPY;  Surgeon: Rollin Dover, MD;  Location: [facility];  Service: Gastroenterology;  Laterality: N/A;   FLEXIBLE SIGMOIDOSCOPY N/A 09/17/2023   Procedure: KINGSTON SIDE;  Surgeon: Rollin Dover, MD;  Location: [facility];  Service: Gastroenterology;  Laterality: N/A;   LEFT HEART CATH AND CORONARY ANGIOGRAPHY N/A 11/19/2021   Procedure: LEFT HEART CATH AND CORONARY ANGIOGRAPHY;  Surgeon: Elmira Newman PARAS, MD;  Location: [facility];  Service: Cardiovascular;  Laterality: N/A;   LEFT HEART CATHETERIZATION WITH CORONARY ANGIOGRAM N/A 01/15/2014   Procedure: LEFT HEART CATHETERIZATION WITH CORONARY ANGIOGRAM;  Surgeon: Ozell JONETTA Fell, MD;  Location: [facility] C[facility];  Service: Cardiovascular;  Laterality: N/A;   PORTACATH PLACEMENT N/A 08/18/2022   Procedure: INSERTION PORT-A-CATH;  Surgeon: Teresa Lonni HERO, MD;  Location: [facility];  Service: General;  Laterality: N/A;  60   No family history on file. Social History   Tobacco Use   Smoking status: Every Day    Types: Cigars   Smokeless tobacco: Never   Tobacco comments:    Smokes 3-5 cigars daily.- khj 01/05/2024  Substance Use Topics   Alcohol use: Yes    Comment: beer occ   LABS:  Hospital Outpatient Visit on 01/24/2024  Component Date Value Ref Range Status   WBC 01/24/2024 6.6  4.0 - 10.5 K/uL Final   RBC 01/24/2024 3.68 (L)  4.22 - 5.81 MIL/uL Final   Hemoglobin 01/24/2024 12.0 (L)  13.0 - 17.0 g/dL Final   HCT 90/77/7974 35.1 (L)  39.0 - 52.0 % Final   MCV 01/24/2024 95.4  80.0 - 100.0 fL Final   MCH 01/24/2024 32.6  26.0 - 34.0 pg Final   MCHC 01/24/2024 34.2  30.0 - 36.0 g/dL Final   RDW 90/77/7974 12.4  11.5 - 15.5 % Final    Platelets 01/24/2024 166  150 - 400 K/uL Final   nRBC 01/24/2024 0.0  0.0 - 0.2 % Final   Performed at [facility], [address] [place], [place] [address]   Sodium 01/24/2024 136  135 - 145 mmol/L Final   Potassium 01/24/2024 3.6  3.5 - 5.1 mmol/L Final   Chloride 01/24/2024 101  98 - 111 mmol/L Final   CO2 01/24/2024 23  22 - 32 mmol/L Final   Glucose, Bld 01/24/2024 138 (H)  70 - 99 mg/dL Final   Glucose reference range applies only to samples taken after fasting for at least 8 hours.   BUN 01/24/2024 12  8 - 23 mg/dL Final   Creatinine, Ser 01/24/2024 0.99  0.61 - 1.24 mg/dL Final   Calcium  01/24/2024 8.4 (L)  8.9 - 10.3 mg/dL Final  GFR, Estimated 01/24/2024 >60  >60 mL/min Final   Comment: (NOTE) Calculated using the CKD-EPI Creatinine Equation (2021)    Anion gap 01/24/2024 12  5 - 15 Final   Performed at [facility], [address] [place], [place] [address]    ECG: Date: 01/24/2024  Time ECG obtained: 1004 AM Rate: 85 bpm Rhythm: normal sinus Axis (leads I and aVF): normal Intervals: PR 134 ms. QRS 90 ms. QTc 454 ms. ST segment and T wave changes: Non-specific anterior T-wave abnormality  Evidence of a possible, age undetermined, prior infarct:  No Comparison: Similar to previous tracing obtained on 07/07/2023   IMAGING / PROCEDURES: NM PET IMAGE INITIAL (PI) SKULL BASE TO THIGH performed on 11/15/2023 Multiple lung nodules are again seen as on prior examination. Several the show some level of abnormal uptake in the worrisome for lung metastases. No additional soft tissue areas of abnormal uptake at this time.  CT HIP LEFT WO CONTRAST performed on 11/09/2013 No acute osseous abnormality of the left hip. Mild osteoarthritis of the left hip. 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  CT CHEST AND  ABDOMEN W CONTRAST performed on 10/18/2023 Interval progression of pulmonary metastatic disease with multiple new pulmonary nodules and interval enlargement of a dominant 11 mm nodule within the superior segment right lower lobe. Extensive multi-vessel coronary artery calcification. Cholelithiasis. Minimal bilateral nonobstructing nephrolithiasis. Extensive distal colonic diverticulosis. Extensive aortoiliac atherosclerotic calcification with particularly prominent atherosclerotic calcification at the origin of the renal arteries bilaterally and within the proximal superior mesenteric artery and inferior mesenteric artery origin. Correlation for signs and symptoms of mesenteric ischemia or clinically significant renal artery stenosis is recommended. If indicated, this could be better assessed with CT arteriography.  VAS US  CAROTID performed on 08/09/2023 Velocities in the right ICA are consistent with a 40-59% stenosis. The ECA appears >50% stenosed.  Velocities in the left ICA are consistent with a 1-39% stenosis. The ECA appears >50% stenosed.  Bilateral vertebral arteries demonstrate antegrade flow.  Normal flow hemodynamics were seen in bilateral subclavian arteries.   CORONARY STENT INTERVENTION performed on 12/23/2021 Successful PTCA and balloon angioplasty of the RI equivalent mid to distal segment vessel with a 2.0 x 20 mm sapphire at low atmospheric pressure, 100% stenosis reduced to <20% with TIMI 0 to TIMI-3 flow postprocedure.  Large area of distribution of the vessel with multiple secondary branches. Successful PTCA and stenting of the mid segment of the D1 at bifurcation, large vessel.  Stenting with 2.25 x 12 mm Onyx 28 DES, stenosis reduced from 90% to 0% with TIMI-3 to TIMI-3 flow. Recommendation: Patient will be discharged home today, continue aspirin  indefinitely and Plavix  for repeated of 1 year in view of ACS.  80 mL contrast utilized.  Sedation time 34  minutes.   TRANSTHORACIC ECHOCARDIOGRAM performed on 11/20/2021 Left ventricular ejection fraction, by estimation, is 50 to 55%. The left ventricle has low normal function. The left ventricle has no regional wall motion abnormalities. There is mild left ventricular hypertrophy. Left ventricular diastolic parameters were normal. The E/e' is 13.8.  Right ventricular systolic function is normal. The right ventricular size is normal.  The mitral valve is grossly normal. No evidence of mitral valve regurgitation. No evidence of mitral stenosis.  The aortic valve is tricuspid. Aortic valve regurgitation is not visualized. Aortic valve sclerosis is present, with no evidence of aortic valve stenosis.   LEFT  HEART CATHETERIZATION AND CORONARY ANGIOGRAPHY performed on 11/19/2021 Low normal left ventricular systolic function with an EF of 50-55% Normal LVEDP Multivessel CAD 20% mid LM 40% proximal LAD 80% lateral D1  90% D1 80% RPDA Recommendations Moderate to severe multivessel disease, primarily in secondary and tertiary branches Unchanged severe lesions in diag and RPDA, likely stable since 2015 Possibly culprit small inferior branch stenosis in diag is too small to intervene Troponin mildly elevated and relatively flat, with hypertension being a possible insult Recommend medical management at this time with up titration of anti anginal therapy, smoking cessation, risk factor modification.   IMPRESSION AND PLAN: Ronald [name] has been referred for pre-anesthesia review and clearance prior to him undergoing the planned anesthetic and procedural courses. Available labs, pertinent testing, and imaging results were personally reviewed by me in preparation for upcoming operative/procedural course. [facility] medical record has been updated following extensive record review and patient interview with PAT staff.   This patient has been appropriately cleared by cardiology with an overall ACCEPTABLE   risk of patient experiencing significant perioperative cardiovascular complications. Based on clinical review performed today (01/21/24), barring any significant acute changes in the patient's overall condition, it is anticipated that he will be able to proceed with the planned surgical intervention. Any acute changes in clinical condition may necessitate his procedure being postponed and/or cancelled. Patient will meet with anesthesia team (MD and/or CRNA) on the day of his procedure for preoperative evaluation/assessment. Questions regarding anesthetic course will be fielded at that time.   Pre-surgical instructions were reviewed with the patient during his PAT appointment, and questions were fielded to satisfaction by PAT clinical staff. He has been instructed on which medications that he will need to hold prior to surgery, as well as the ones that have been deemed safe/appropriate to take on the day of his procedure. As part of the general education provided by PAT, patient made aware both verbally and in writing, that he would need to abstain from the use of any illegal substances during his perioperative course. He was advised that failure to follow the provided instructions could necessitate case cancellation or result in serious perioperative complications up to and including death. Patient encouraged to contact PAT and/or his surgeon's office to discuss any questions or concerns that may arise prior to surgery; verbalized understanding.   Dorise Pereyra, MSN, APRN, FNP-C, CEN [facility]  Perioperative Services Nurse Practitioner Phone: [phone] Fax: [phone] 01/21/24 2:54 PM  NOTE: This note has been prepared using Dragon dictation software. Despite my best ability to proofread, there is always the potential that unintentional transcriptional errors may still occur from this process.

## 2024-01-21 NOTE — Progress Notes (Signed)
 Virtual Visit via Telephone Note   Because of Ronald [name] co-morbid illnesses, he is at least at moderate risk for complications without adequate follow up.  This format is felt to be most appropriate for this patient at this time.  Due to technical limitations with video connection (technology), today's appointment will be conducted as an audio only telehealth visit, and Ronald [name] verbally agreed to proceed in this manner.   All issues noted in this document were discussed and addressed.  No physical exam could be performed with this format.  Evaluation Performed:  Preoperative cardiovascular risk assessment _____________   Date:  01/21/2024   Patient ID:  Ronald [name], DOB August 03, 1945, MRN [id number] Patient Location:  Home Provider location:   Office  Primary Care Provider:  Virgia Dorothe BRAVO, MD Primary Cardiologist:  Ronald Bergamo, MD  Chief Complaint / Patient Profile   78 y.o. y/o male with a h/o CAD s/p  DES-D1, PTCA-RI in 12/2021, hypertension, hyperlipidemia who is pending VIDEO BRONCHOSCOPY WITH ENDOBRONCHIAL NAVIGATION on 01/25/2024 with Dr. Belva [name] of [facility] and presents today for telephonic preoperative cardiovascular risk assessment.  History of Present Illness    Ronald [name] is a 78 y.o. male who presents via audio/video conferencing for a telehealth visit today.  Pt was last seen in cardiology clinic on 07/07/2023 by Dr. Bergamo.  At that time Ronald [name] was doing well.  The patient is now pending procedure as outlined above. Since his last visit, he has not had any issues with his heart.  Denies chest pain and shortness of breath.  Overall, feeling good from a heart standpoint.  He does exceed 4 METS on the DASI.  In regards to his aspirin , we would recommend continuing it throughout the perioperative period.   Past Medical History    Past Medical History:  Diagnosis Date   Alcohol use    Arthritis    Bronchitis    Cigar smoker    Colon cancer  (HCC)    Coronary artery disease    x 2 stents   History of kidney stones    Hyperlipidemia    Hypertension    Long-term use of aspirin  therapy    Myocardial infarction [facility])    NSTEMI (non-ST elevated myocardial infarction) (HCC) 01/14/2014   a.) LHC 01/15/2014: 30-40% pLM, 30-40% mLAD, 80% D1, 40-50% p-mRCA, 99% RPDA  --> IRA was the RDPA, however it was small and not amenable to PCI --> Rx mgmt   Past Surgical History:  Procedure Laterality Date   BIOPSY  07/24/2022   Procedure: BIOPSY;  Surgeon: Ronald Dover, MD;  Location: [facility];  Service: Gastroenterology;;   BIOPSY  05/14/2023   Procedure: BIOPSY;  Surgeon: Ronald Dover, MD;  Location: [facility];  Service: Gastroenterology;;   BIOPSY OF SKIN SUBCUTANEOUS TISSUE AND/OR MUCOUS MEMBRANE  09/17/2023   Procedure: BIOPSY, SKIN, SUBCUTANEOUS TISSUE, OR MUCOUS MEMBRANE;  Surgeon: Ronald Dover, MD;  Location: [facility];  Service: Gastroenterology;;   COLONOSCOPY WITH PROPOFOL  N/A 07/24/2022   Procedure: COLONOSCOPY WITH PROPOFOL ;  Surgeon: Ronald Dover, MD;  Location: [facility];  Service: Gastroenterology;  Laterality: N/A;   CORONARY STENT INTERVENTION N/A 12/23/2021   Procedure: CORONARY STENT INTERVENTION;  Surgeon: [name] Gordy, MD;  Location: [facility];  Service: Cardiovascular;  Laterality: N/A;   FLEXIBLE SIGMOIDOSCOPY N/A 05/14/2023   Procedure: FLEXIBLE SIGMOIDOSCOPY;  Surgeon: Ronald Dover, MD;  Location: [facility];  Service: Gastroenterology;  Laterality: N/A;   FLEXIBLE  SIGMOIDOSCOPY N/A 09/17/2023   Procedure: KINGSTON SIDE;  Surgeon: Ronald Dover, MD;  Location: [facility];  Service: Gastroenterology;  Laterality: N/A;   LEFT HEART CATH AND CORONARY ANGIOGRAPHY N/A 11/19/2021   Procedure: LEFT HEART CATH AND CORONARY ANGIOGRAPHY;  Surgeon: Ronald Newman PARAS, MD;  Location: [facility];  Service: Cardiovascular;  Laterality: N/A;   LEFT HEART CATHETERIZATION WITH CORONARY ANGIOGRAM  N/A 01/15/2014   Procedure: LEFT HEART CATHETERIZATION WITH CORONARY ANGIOGRAM;  Surgeon: Ronald JONETTA Fell, MD;  [facility];  Service: Cardiovascular;  Laterality: N/A;   PORTACATH PLACEMENT N/A 08/18/2022   Procedure: INSERTION PORT-A-CATH;  Surgeon: Ronald Lonni HERO, MD;  Location: [facility];  Service: General;  Laterality: N/A;  60    Allergies  Allergies  Allergen Reactions   Angiotensin Receptor Blockers Diarrhea   Beta Adrenergic Blockers Other (See Comments)    Fatigue   Zestril  [Lisinopril ] Rash    Home Medications    Prior to Admission medications   Medication Sig Start Date End Date Taking? Authorizing Provider  amLODipine  (NORVASC ) 5 MG tablet Take 1 tablet (5 mg total) by mouth daily at 10 pm. Patient taking differently: Take 5 mg by mouth daily. 12/22/21   Ronald Heinz, MD  aspirin  81 MG EC tablet Take 1 tablet (81 mg total) by mouth daily. 09/25/20   Ronald Heinz, MD  atorvastatin  (LIPITOR ) 80 MG tablet Take 1 tablet (80 mg total) by mouth at bedtime. 09/30/22   Ronald Heinz, MD  cloNIDine  (CATAPRES ) 0.3 MG tablet Take 1 tablet (0.3 mg total) by mouth 2 (two) times daily. 10/13/22   Ronald Heinz, MD  diphenhydramine-acetaminophen  (TYLENOL  PM) 25-500 MG TABS tablet Take 2 tablets by mouth at bedtime.    [provider]  Docusate Sodium (COLACE PO) Take 2 capsules by mouth as needed.    [provider]  DULoxetine (CYMBALTA) 30 MG capsule Take 30 mg by mouth daily.    [provider]  ezetimibe  (ZETIA ) 10 MG tablet Take 1 tablet (10 mg total) by mouth daily. 09/30/22 01/05/24  Ronald Heinz, MD  isosorbide  mononitrate (IMDUR ) 30 MG 24 hr tablet Take 1 tablet (30 mg total) by mouth every morning. 07/03/22   Ronald Heinz, MD  lidocaine -prilocaine  (EMLA ) cream Apply 1 Application topically as needed (Use as directed to apply to port area 1-2 hours prior to appt). 08/19/22   Ronald Arley NOVAK, MD  loperamide  (IMODIUM  A-D) 2 MG tablet Take 1-2 tablets (2-4 mg total)  by mouth 4 (four) times daily as needed for diarrhea or loose stools (Up to 8tabs/day). Patient not taking: Reported on 01/05/2024 12/08/22   Ronald Arley NOVAK, MD  MAGNESIUM  PO Take 1 tablet by mouth at bedtime.    [provider]  metoprolol  succinate (TOPROL -XL) 25 MG 24 hr tablet Take 1 tablet (25 mg total) by mouth daily. 11/18/22   Ronald Heinz, MD  nitroGLYCERIN  (NITROSTAT ) 0.4 MG SL tablet Place 1 tablet (0.4 mg total) under the tongue every 5 (five) minutes x 3 doses as needed for chest pain. 09/25/20   Ronald Heinz, MD  Omega-3 Fatty Acids (FISH OIL PO) Take 1 capsule by mouth daily.    [provider]  omeprazole  (PRILOSEC  OTC) 20 MG tablet Take 1 tablet (20 mg total) by mouth daily. 09/25/20   Ronald Heinz, MD  ondansetron  (ZOFRAN ) 8 MG tablet TAKE ONE TABLET BY MOUTH EVERY 8 HOURS AS NEEDED (MAY USE FOR NAUSEA 3 DAYS AFTER CHEMO) Patient not taking: Reported  on 01/05/2024 01/06/23   Ronald Arley NOVAK, MD  OVER THE COUNTER MEDICATION Take 5 capsules by mouth daily at 6 (six) AM.    [provider]  polyethylene glycol (MIRALAX / GLYCOLAX) 17 g packet Take 17 g by mouth 2 (two) times daily. Patient not taking: Reported on 01/05/2024    [provider]  potassium chloride  SA (KLOR-CON  M) 20 MEQ tablet Take 1 tablet (20 mEq total) by mouth 2 (two) times daily. 12/08/22   Ronald Arley NOVAK, MD  predniSONE (DELTASONE) 20 MG tablet Take 20 mg by mouth as needed (it is his friends prescription).    [provider]  Probiotic Product (RESTORA) CAPS Take 1 capsule by mouth daily. Patient not taking: Reported on 01/05/2024    [provider]  prochlorperazine  (COMPAZINE ) 10 MG tablet Take 1 tablet (10 mg total) by mouth every 6 (six) hours as needed for nausea or vomiting. Patient not taking: Reported on 01/05/2024 08/19/22   Ronald Arley NOVAK, MD  tamsulosin  (FLOMAX ) 0.4 MG CAPS capsule Take 0.4 mg by mouth 2 (two) times daily.    [provider]  TURMERIC PO  Take 2 tablets by mouth daily.    [provider]    Physical Exam    Vital Signs:  Ronald [name] does not have vital signs available for review today.  Given telephonic nature of communication, physical exam is limited. AAOx3. NAD. Normal affect.  Speech and respirations are unlabored.  Accessory Clinical Findings    None  Assessment & Plan    1.  Preoperative Cardiovascular Risk Assessment:  Mr. Nish's perioperative risk of a major cardiac event is 0.9% according to the Revised Cardiac Risk Index (RCRI).  Therefore, he is at low risk for perioperative complications.   His functional capacity is good at 7.28 METs according to the Duke Activity Status Index (DASI). Recommendations: According to ACC/AHA guidelines, no further cardiovascular testing needed.  The patient may proceed to surgery at acceptable risk.   Antiplatelet and/or Anticoagulation Recommendations: The patient should remain on Aspirin  without interruption.    The patient was advised that if he develops new symptoms prior to surgery to contact our office to arrange for a follow-up visit, and he verbalized understanding.  Regarding ASA therapy, we recommend continuation of ASA throughout the perioperative period.  However, if the surgeon feels that cessation of ASA is required in the perioperative period, it may be stopped 5-7 days prior to surgery with a plan to resume it as soon as felt to be feasible from a surgical standpoint in the post-operative period.  A copy of this note will be routed to requesting surgeon.  Time:   Today, I have spent 5 minutes with the patient with telehealth technology discussing medical history, symptoms, and management plan.     Damien JAYSON Braver, NP  01/21/2024, 10:43 AM

## 2024-01-21 NOTE — Telephone Encounter (Signed)
*  STAT* If patient is at the pharmacy, call can be transferred to refill team.   1. Which medications need to be refilled? (please list name of each medication and dose if known)  cloNIDine  (CATAPRES ) 0.3 MG tablet  2. Which pharmacy/location (including street and city if local pharmacy) is medication to be sent to? [facility] - [place], [place] - [address] [facility] [address] [facility] [phone] - [place], [place] - [address]  3. Do they need a 30 day or 90 day supply?  90 day supply to [facility] + emergency supply to local [facility] if at all possibly. He says he is completely out of medication + [facility] normally takes about 10 days to process.

## 2024-01-21 NOTE — Telephone Encounter (Signed)
 Pt's medication was sent to both pharmacies as requested. Confirmation received.

## 2024-01-24 ENCOUNTER — Ambulatory Visit: Payer: Self-pay | Admitting: Oncology

## 2024-01-24 ENCOUNTER — Ambulatory Visit
Admission: RE | Admit: 2024-01-24 | Discharge: 2024-01-24 | Disposition: A | Discharge status: Home / Self Care | Source: Ambulatory Visit | Attending: Physical Medicine and Rehabilitation

## 2024-01-24 ENCOUNTER — Ambulatory Visit
Admission: RE | Admit: 2024-01-24 | Discharge: 2024-01-24 | Disposition: A | Discharge status: Home / Self Care | Source: Ambulatory Visit | Attending: Student in an Organized Health Care Education/Training Program | Admitting: Student in an Organized Health Care Education/Training Program

## 2024-01-24 ENCOUNTER — Encounter
Admission: RE | Admit: 2024-01-24 | Discharge: 2024-01-24 | Disposition: A | Discharge status: Home / Self Care | Source: Ambulatory Visit | Attending: Student in an Organized Health Care Education/Training Program | Admitting: Student in an Organized Health Care Education/Training Program

## 2024-01-24 ENCOUNTER — Ambulatory Visit
Admission: RE | Admit: 2024-01-24 | Discharge: 2024-01-24 | Disposition: A | Discharge status: Home / Self Care | Source: Ambulatory Visit | Attending: Physical Medicine and Rehabilitation | Admitting: Physical Medicine and Rehabilitation

## 2024-01-24 DIAGNOSIS — M25552 Pain in left hip: Secondary | ICD-10-CM

## 2024-01-24 DIAGNOSIS — I10 Essential (primary) hypertension: Secondary | ICD-10-CM | POA: Insufficient documentation

## 2024-01-24 DIAGNOSIS — R911 Solitary pulmonary nodule: Secondary | ICD-10-CM

## 2024-01-24 DIAGNOSIS — Z01810 Encounter for preprocedural cardiovascular examination: Secondary | ICD-10-CM | POA: Diagnosis not present

## 2024-01-24 DIAGNOSIS — Z01818 Encounter for other preprocedural examination: Secondary | ICD-10-CM | POA: Insufficient documentation

## 2024-01-24 DIAGNOSIS — M5432 Sciatica, left side: Secondary | ICD-10-CM

## 2024-01-24 DIAGNOSIS — M1612 Unilateral primary osteoarthritis, left hip: Secondary | ICD-10-CM

## 2024-01-24 LAB — BASIC METABOLIC PANEL WITH GFR
Anion gap: 12 (ref 5–15)
BUN: 12 mg/dL (ref 8–23)
CO2: 23 mmol/L (ref 22–32)
Calcium: 8.4 mg/dL — ABNORMAL LOW (ref 8.9–10.3)
Chloride: 101 mmol/L (ref 98–111)
Creatinine, Ser: 0.99 mg/dL (ref 0.61–1.24)
GFR, Estimated: >60 mL/min (ref >60)
Glucose, Bld: 138 mg/dL — ABNORMAL HIGH (ref 70–99)
Potassium: 3.6 mmol/L (ref 3.5–5.1)
Sodium: 136 mmol/L (ref 135–145)

## 2024-01-24 LAB — CBC
HCT: 35.1 % — ABNORMAL LOW (ref 39.0–52.0)
Hemoglobin: 12 g/dL — ABNORMAL LOW (ref 13.0–17.0)
MCH: 32.6 pg (ref 26.0–34.0)
MCHC: 34.2 g/dL (ref 30.0–36.0)
MCV: 95.4 fL (ref 80.0–100.0)
Platelets: 166 K/uL (ref 150–400)
RBC: 3.68 MIL/uL — ABNORMAL LOW (ref 4.22–5.81)
RDW: 12.4 % (ref 11.5–15.5)
WBC: 6.6 K/uL (ref 4.0–10.5)
nRBC: 0 % (ref 0.0–0.2)

## 2024-01-25 ENCOUNTER — Other Ambulatory Visit: Payer: Self-pay

## 2024-01-25 ENCOUNTER — Telehealth: Payer: Self-pay | Admitting: *Deleted

## 2024-01-25 ENCOUNTER — Ambulatory Visit

## 2024-01-25 ENCOUNTER — Ambulatory Visit
Admission: RE | Admit: 2024-01-25 | Discharge: 2024-01-25 | Disposition: A | Discharge status: Home / Self Care | Attending: Student in an Organized Health Care Education/Training Program | Admitting: Student in an Organized Health Care Education/Training Program

## 2024-01-25 ENCOUNTER — Encounter
Admission: RE | Disposition: A | Discharge status: Home / Self Care | Payer: Self-pay | Source: Home / Self Care | Attending: Student in an Organized Health Care Education/Training Program

## 2024-01-25 ENCOUNTER — Encounter: Payer: Self-pay | Admitting: Student in an Organized Health Care Education/Training Program

## 2024-01-25 ENCOUNTER — Ambulatory Visit: Payer: Self-pay | Admitting: Urgent Care

## 2024-01-25 DIAGNOSIS — R911 Solitary pulmonary nodule: Secondary | ICD-10-CM

## 2024-01-25 DIAGNOSIS — I700 Atherosclerosis of aorta: Secondary | ICD-10-CM | POA: Insufficient documentation

## 2024-01-25 DIAGNOSIS — E785 Hyperlipidemia, unspecified: Secondary | ICD-10-CM | POA: Insufficient documentation

## 2024-01-25 DIAGNOSIS — Z923 Personal history of irradiation: Secondary | ICD-10-CM | POA: Diagnosis not present

## 2024-01-25 DIAGNOSIS — I25110 Atherosclerotic heart disease of native coronary artery with unstable angina pectoris: Secondary | ICD-10-CM | POA: Diagnosis not present

## 2024-01-25 DIAGNOSIS — N400 Enlarged prostate without lower urinary tract symptoms: Secondary | ICD-10-CM | POA: Insufficient documentation

## 2024-01-25 DIAGNOSIS — J449 Chronic obstructive pulmonary disease, unspecified: Secondary | ICD-10-CM | POA: Diagnosis not present

## 2024-01-25 DIAGNOSIS — I739 Peripheral vascular disease, unspecified: Secondary | ICD-10-CM | POA: Diagnosis not present

## 2024-01-25 DIAGNOSIS — Z9221 Personal history of antineoplastic chemotherapy: Secondary | ICD-10-CM | POA: Insufficient documentation

## 2024-01-25 DIAGNOSIS — F419 Anxiety disorder, unspecified: Secondary | ICD-10-CM | POA: Diagnosis not present

## 2024-01-25 DIAGNOSIS — F17290 Nicotine dependence, other tobacco product, uncomplicated: Secondary | ICD-10-CM | POA: Diagnosis not present

## 2024-01-25 DIAGNOSIS — Z85048 Personal history of other malignant neoplasm of rectum, rectosigmoid junction, and anus: Secondary | ICD-10-CM | POA: Diagnosis not present

## 2024-01-25 DIAGNOSIS — Z77090 Contact with and (suspected) exposure to asbestos: Secondary | ICD-10-CM | POA: Diagnosis not present

## 2024-01-25 DIAGNOSIS — I10 Essential (primary) hypertension: Secondary | ICD-10-CM | POA: Diagnosis not present

## 2024-01-25 DIAGNOSIS — Z79899 Other long term (current) drug therapy: Secondary | ICD-10-CM | POA: Insufficient documentation

## 2024-01-25 DIAGNOSIS — Z7982 Long term (current) use of aspirin: Secondary | ICD-10-CM | POA: Diagnosis not present

## 2024-01-25 DIAGNOSIS — I252 Old myocardial infarction: Secondary | ICD-10-CM | POA: Insufficient documentation

## 2024-01-25 DIAGNOSIS — C189 Malignant neoplasm of colon, unspecified: Secondary | ICD-10-CM | POA: Diagnosis not present

## 2024-01-25 DIAGNOSIS — M1990 Unspecified osteoarthritis, unspecified site: Secondary | ICD-10-CM | POA: Insufficient documentation

## 2024-01-25 DIAGNOSIS — Z955 Presence of coronary angioplasty implant and graft: Secondary | ICD-10-CM | POA: Diagnosis not present

## 2024-01-25 DIAGNOSIS — F4310 Post-traumatic stress disorder, unspecified: Secondary | ICD-10-CM | POA: Insufficient documentation

## 2024-01-25 DIAGNOSIS — C7801 Secondary malignant neoplasm of right lung: Secondary | ICD-10-CM | POA: Diagnosis not present

## 2024-01-25 DIAGNOSIS — K219 Gastro-esophageal reflux disease without esophagitis: Secondary | ICD-10-CM | POA: Diagnosis not present

## 2024-01-25 DIAGNOSIS — I6523 Occlusion and stenosis of bilateral carotid arteries: Secondary | ICD-10-CM | POA: Insufficient documentation

## 2024-01-25 HISTORY — DX: Peripheral vascular disease, unspecified: I73.9

## 2024-01-25 HISTORY — DX: Chronic obstructive pulmonary disease, unspecified: J44.9

## 2024-01-25 HISTORY — PX: VIDEO BRONCHOSCOPY WITH ENDOBRONCHIAL NAVIGATION: SHX6175

## 2024-01-25 HISTORY — DX: Unstable angina: I20.0

## 2024-01-25 HISTORY — DX: Disorder of arteries and arterioles, unspecified: I77.9

## 2024-01-25 HISTORY — DX: Post-traumatic stress disorder, chronic: F43.12

## 2024-01-25 HISTORY — DX: Diverticulosis of intestine, part unspecified, without perforation or abscess without bleeding: K57.90

## 2024-01-25 HISTORY — DX: Calculus of gallbladder without cholecystitis without obstruction: K80.20

## 2024-01-25 HISTORY — DX: Alcohol use, unspecified, uncomplicated: F10.90

## 2024-01-25 HISTORY — DX: Nicotine dependence, other tobacco product, uncomplicated: F17.290

## 2024-01-25 HISTORY — DX: Other nonspecific abnormal finding of lung field: R91.8

## 2024-01-25 HISTORY — DX: Personal history of military deployment: Z91.82

## 2024-01-25 HISTORY — DX: Long term (current) use of aspirin: Z79.82

## 2024-01-25 HISTORY — DX: Atherosclerosis of aorta: I70.0

## 2024-01-25 HISTORY — DX: Personal history of military service: Z91.85

## 2024-01-25 HISTORY — DX: Benign prostatic hyperplasia without lower urinary tract symptoms: N40.0

## 2024-01-25 HISTORY — DX: Malignant neoplasm of colon, unspecified: C18.9

## 2024-01-25 SURGERY — VIDEO BRONCHOSCOPY WITH ENDOBRONCHIAL NAVIGATION
Anesthesia: General | Laterality: Bilateral

## 2024-01-25 MED ORDER — FENTANYL CITRATE (PF) 100 MCG/2ML IJ SOLN
INTRAMUSCULAR | Status: DC | PRN
  Administered 2024-01-25: 50 ug via INTRAVENOUS

## 2024-01-25 MED ORDER — LIDOCAINE HCL (PF) 2 % IJ SOLN
INTRAMUSCULAR | Status: AC
  Filled 2024-01-25: qty 5

## 2024-01-25 MED ORDER — DEXAMETHASONE SODIUM PHOSPHATE 10 MG/ML IJ SOLN
INTRAMUSCULAR | Status: DC | PRN
  Administered 2024-01-25: 10 mg via INTRAVENOUS

## 2024-01-25 MED ORDER — ROCURONIUM BROMIDE 100 MG/10ML IV SOLN
INTRAVENOUS | Status: DC | PRN
  Administered 2024-01-25: 50 mg via INTRAVENOUS

## 2024-01-25 MED ORDER — PHENYLEPHRINE HCL-NACL 20-0.9 MG/250ML-% IV SOLN
INTRAVENOUS | Status: DC | PRN
  Administered 2024-01-25: 20 ug/min via INTRAVENOUS

## 2024-01-25 MED ORDER — SUGAMMADEX SODIUM 200 MG/2ML IV SOLN
INTRAVENOUS | Status: DC | PRN
  Administered 2024-01-25: 200 mg via INTRAVENOUS
  Administered 2024-01-25: 100 mg via INTRAVENOUS

## 2024-01-25 MED ORDER — CHLORHEXIDINE GLUCONATE 0.12 % MT SOLN
15.0000 mL | Freq: Once | OROMUCOSAL | Status: AC
  Administered 2024-01-25: 15 mL via OROMUCOSAL

## 2024-01-25 MED ORDER — PHENYLEPHRINE HCL-NACL 20-0.9 MG/250ML-% IV SOLN
INTRAVENOUS | Status: AC
Start: 2024-01-25 — End: 2024-01-25
  Filled 2024-01-25: qty 250

## 2024-01-25 MED ORDER — FENTANYL CITRATE (PF) 100 MCG/2ML IJ SOLN
INTRAMUSCULAR | Status: AC
  Filled 2024-01-25: qty 2

## 2024-01-25 MED ORDER — ONDANSETRON HCL 4 MG/2ML IJ SOLN
INTRAMUSCULAR | Status: DC | PRN
  Administered 2024-01-25: 4 mg via INTRAVENOUS

## 2024-01-25 MED ORDER — CHLORHEXIDINE GLUCONATE 0.12 % MT SOLN
OROMUCOSAL | Status: AC
  Filled 2024-01-25: qty 15

## 2024-01-25 MED ORDER — GLYCOPYRROLATE 0.2 MG/ML IJ SOLN
INTRAMUSCULAR | Status: DC | PRN
  Administered 2024-01-25: .1 mg via INTRAVENOUS

## 2024-01-25 MED ORDER — DEXAMETHASONE SODIUM PHOSPHATE 10 MG/ML IJ SOLN
INTRAMUSCULAR | Status: AC
Start: 2024-01-25 — End: 2024-01-25
  Filled 2024-01-25: qty 1

## 2024-01-25 MED ORDER — LIDOCAINE HCL (CARDIAC) PF 100 MG/5ML IV SOSY
PREFILLED_SYRINGE | INTRAVENOUS | Status: DC | PRN
  Administered 2024-01-25: 100 mg via INTRAVENOUS

## 2024-01-25 MED ORDER — ROCURONIUM BROMIDE 10 MG/ML (PF) SYRINGE
PREFILLED_SYRINGE | INTRAVENOUS | Status: AC
  Filled 2024-01-25: qty 10

## 2024-01-25 MED ORDER — GLYCOPYRROLATE 0.2 MG/ML IJ SOLN
INTRAMUSCULAR | Status: AC
  Filled 2024-01-25: qty 1

## 2024-01-25 MED ORDER — PROPOFOL 10 MG/ML IV BOLUS
INTRAVENOUS | Status: DC | PRN
  Administered 2024-01-25: 150 mg via INTRAVENOUS
  Administered 2024-01-25: 140 ug/kg/min via INTRAVENOUS

## 2024-01-25 MED ORDER — LACTATED RINGERS IV SOLN
INTRAVENOUS | Status: DC
Start: 2024-01-25 — End: 2024-01-25

## 2024-01-25 MED ORDER — MIDAZOLAM HCL 2 MG/2ML IJ SOLN
INTRAMUSCULAR | Status: DC | PRN
  Administered 2024-01-25: 1 mg via INTRAVENOUS

## 2024-01-25 MED ORDER — ONDANSETRON HCL 4 MG/2ML IJ SOLN
INTRAMUSCULAR | Status: AC
  Filled 2024-01-25: qty 2

## 2024-01-25 MED ORDER — PHENYLEPHRINE 80 MCG/ML (10ML) SYRINGE FOR IV PUSH (FOR BLOOD PRESSURE SUPPORT)
PREFILLED_SYRINGE | INTRAVENOUS | Status: DC | PRN
  Administered 2024-01-25 (×2): 80 ug via INTRAVENOUS

## 2024-01-25 MED ORDER — ORAL CARE MOUTH RINSE: 15.0000 mL | Freq: Once | OROMUCOSAL | Status: AC

## 2024-01-25 MED ORDER — MIDAZOLAM HCL 2 MG/2ML IJ SOLN
INTRAMUSCULAR | Status: AC
  Filled 2024-01-25: qty 2

## 2024-01-25 MED ORDER — PROPOFOL 1000 MG/100ML IV EMUL
INTRAVENOUS | Status: AC
  Filled 2024-01-25: qty 100

## 2024-01-25 MED ORDER — PROPOFOL 10 MG/ML IV BOLUS
INTRAVENOUS | Status: AC
  Filled 2024-01-25: qty 20

## 2024-01-25 NOTE — Op Note (Signed)
 Video Bronchoscopy with Robotic Assisted Bronchoscopic Navigation   Date of Operation: 01/25/2024   Pre-op Diagnosis: lung nodule  Surgeon: Belva November, MD  Anesthesia: General endotracheal anesthesia  Operation: Flexible video fiberoptic bronchoscopy with robotic assistance and biopsies.  Estimated Blood Loss: Minimal  Complications: None  Indications and History: Ronald [name] is a 78 y.o. male with history of rectal cancer presenting with FDG avid pulmonary nodules.  Recommendation made to achieve a tissue diagnosis via robotic assisted navigational bronchoscopy.  The risks, benefits, complications, treatment options and expected outcomes were discussed with the patient.  The possibilities of pneumothorax, pneumonia, reaction to medication, pulmonary aspiration, perforation of a viscus, bleeding, failure to diagnose a condition and creating a complication requiring transfusion or operation were discussed with the patient who freely signed the consent.    Description of Procedure: The patient was seen in the Preoperative Area, was examined and was deemed appropriate to proceed.  The patient was taken to [facility] Endoscopy room 3, identified as [name] and the procedure verified as Flexible Video Fiberoptic Bronchoscopy.  A Time Out was held and the above information confirmed.   Prior to the date of the procedure a high-resolution CT scan of the chest was performed. Utilizing ION software program a virtual tracheobronchial tree was generated to allow the creation of distinct navigation pathways to the patient's parenchymal abnormalities. After being taken to the operating room general anesthesia was initiated and the patient  was orally intubated. The video fiberoptic bronchoscope was introduced via the endotracheal tube and a general inspection was performed which showed normal right and left lung anatomy. Aspiration of the bilateral mainstems was completed to remove any remaining  secretions. Robotic catheter inserted into patient's endotracheal tube.   Target #1 RLL nodule: The distinct navigation pathways prepared prior to this procedure were then utilized to navigate to patient's lesion identified on CT scan. The robotic catheter was secured into place and the vision probe was withdrawn.  Lesion location was approximated using fluoroscopy.  Local registration and targeting was performed using GE 3D OEC mobile C-arm three-dimensional imaging. Under fluoroscopic guidance transbronchial brushings, transbronchial needle biopsies, and transbronchial forceps biopsies were performed to be sent for cytology and pathology.  Needle-in-lesion was confirmed using GE 3D mobile C-arm.  A bronchioalveolar lavage was performed in the RLL and sent for cytology and microbiology.     At the end of the procedure a general airway inspection was performed and there was no evidence of active bleeding. The bronchoscope was removed.  The patient tolerated the procedure well. There was no significant blood loss and there were no obvious complications. A post-procedural chest x-ray is pending.  Samples Target #1: RLL nodule 1. Transbronchial needle brushings from RLL nodule 2. Transbronchial Wang needle biopsies from RLL nodule 3. Transbronchial forceps biopsies from RLL nodule 4. Bronchoalveolar lavage from RLL    Plans:  The patient will be discharged from the PACU to home when recovered from anesthesia and after chest x-ray is reviewed. We will review the cytology, pathology and microbiology results with the patient when they become available. Outpatient followup will be with Dr. Cloretta.  Belva November, MD [facility] 01/25/2024 8:58 AM

## 2024-01-25 NOTE — Progress Notes (Signed)
 Dr. Isadora at bedside when CXR performed.  Stated xray looks good pt can go home

## 2024-01-25 NOTE — Anesthesia Postprocedure Evaluation (Signed)
 Anesthesia Post Note  Patient: Ronald [name]  Procedure(s) Performed: VIDEO BRONCHOSCOPY WITH ENDOBRONCHIAL NAVIGATION (Bilateral)  Patient location during evaluation: PACU Anesthesia Type: General Level of consciousness: awake and alert Pain management: pain level controlled Vital Signs Assessment: post-procedure vital signs reviewed and stable Respiratory status: spontaneous breathing, nonlabored ventilation, respiratory function stable and patient connected to nasal cannula oxygen Cardiovascular status: blood pressure returned to baseline and stable Postop Assessment: no apparent nausea or vomiting Anesthetic complications: no   No notable events documented.   Last Vitals:  Vitals:   01/25/24 0945 01/25/24 0956  BP: (!) 140/69 139/71  Pulse: 65 65  Resp: 16 18  Temp:  36.8 C  SpO2: 99% 100%    Last Pain:  Vitals:   01/25/24 0956  TempSrc: Temporal  PainSc: 0-No pain                 Debby Mines

## 2024-01-25 NOTE — Transfer of Care (Signed)
 Immediate Anesthesia Transfer of Care Note  Patient: Ronald [name]  Procedure(s) Performed: VIDEO BRONCHOSCOPY WITH ENDOBRONCHIAL NAVIGATION (Bilateral)  Patient Location: PACU  Anesthesia Type:General  Level of Consciousness: awake, alert , and drowsy  Airway & Oxygen Therapy: Patient Spontanous Breathing and Patient connected to face mask oxygen  Post-op Assessment: Report given to RN and Post -op Vital signs reviewed and stable  Post vital signs: Reviewed and stable  Last Vitals:  Vitals Value Taken Time  BP 122/62 01/25/24 09:11  Temp 35.9 0911  Pulse 70 01/25/24 09:14  Resp 20 01/25/24 09:14  SpO2 97 % 01/25/24 09:14  Vitals shown include unfiled device data.  Last Pain:  Vitals:   01/25/24 0706  TempSrc: Temporal  PainSc: 0-No pain         Complications: No notable events documented.

## 2024-01-25 NOTE — Procedures (Signed)
 Procedure Note  Patient: Ronald [name]  GE 3D OEC mobile C-arm was utilized to identify and biopsy of RLL nodule.  Needle-in-lesion was confirmed using real-time GE 3D OEC imaging, and images were uploaded to PACS.     Belva November, MD [facility] 01/25/2024 8:59 AM

## 2024-01-25 NOTE — Anesthesia Procedure Notes (Signed)
 Procedure Name: Intubation Date/Time: 01/25/2024 8:16 AM  Performed by: Jackye Spanner, CRNAPre-anesthesia Checklist: Patient identified, Patient being monitored, Timeout performed, Emergency Drugs available and Suction available Patient Re-evaluated:Patient Re-evaluated prior to induction Oxygen Delivery Method: Circle system utilized Preoxygenation: Pre-oxygenation with 100% oxygen Induction Type: IV induction Ventilation: Mask ventilation without difficulty and Oral airway inserted - appropriate to patient size Laryngoscope Size: 3 and McGrath Grade View: Grade I Tube type: Oral Tube size: 8.5 mm Number of attempts: 1 Airway Equipment and Method: Stylet Placement Confirmation: ETT inserted through vocal cords under direct vision, positive ETCO2 and breath sounds checked- equal and bilateral Secured at: 22 cm Tube secured with: Tape Dental Injury: Teeth and Oropharynx as per pre-operative assessment

## 2024-01-25 NOTE — Telephone Encounter (Signed)
 Notified patient that CT showed increase in lung nodules and MD wants to see him in a couple weeks. He agrees and adds that he had his biopsy today. Scheduling message sent.

## 2024-01-25 NOTE — Telephone Encounter (Signed)
 Per Dr. Cloretta: CT shows increase in lung nodules. Needs f/u in 2 weeks. LVM for Ronald [name] to call office re: CT results.

## 2024-01-25 NOTE — Interval H&P Note (Signed)
 Patient presents for robotic assisted navigational bronchoscopy for biopsy of multiple pulmonary nodules that are concerning for metastasis. Risks and benefits explained, and he consents to proceed. He is appropriate for the procedure.  Belva November, MD [facility] 01/25/2024 7:57 AM

## 2024-01-25 NOTE — Anesthesia Preprocedure Evaluation (Signed)
 Anesthesia Evaluation  Patient identified by MRN, date of birth, ID band Patient awake    Reviewed: Allergy & Precautions, NPO status , Patient's Chart, lab work & pertinent test results, reviewed documented beta blocker date and time   History of Anesthesia Complications Negative for: history of anesthetic complications  Airway Mallampati: II  TM Distance: >3 FB Neck ROM: Full    Dental  (+) Poor Dentition, Dental Advisory Given, Chipped   Pulmonary neg pulmonary ROS, COPD, Current Smoker and Patient abstained from smoking.   Pulmonary exam normal breath sounds clear to auscultation       Cardiovascular hypertension, Pt. on medications and Pt. on home beta blockers (-) angina + CAD, + Past MI and + Cardiac Stents  negative cardio ROS Normal cardiovascular exam Rhythm:Regular Rate:Normal  '23 ECHO: EF 50 to 55%.  1. The LV has low normal function, no regional wall motion abnormalities. There is mild left ventricular hypertrophy. Left ventricular diastolic  parameters were normal. The E/e' is 13.8.   2. RVF is normal. The right ventricular size is normal.   3. The mitral valve is grossly normal. No evidence of mitral valve regurgitation. No evidence of mitral stenosis.   4. The aortic valve is tricuspid. Aortic valve regurgitation is not visualized. Aortic valve sclerosis is present, with no evidence of aortic valve stenosis.     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; stage IIIc (T4N1M0); MSS stable, KRAS (+); Tx'd               with neoadjuvant chemoradiation +  surgical resection No date: Alcohol use No date: Aortic atherosclerosis No date: Arthritis No date: Bilateral carotid artery disease No date: Cholelithiasis No date: Chronic post-traumatic stress disorder (PTSD) after military  combat No date: Cigar smoker No date: COPD (chronic obstructive pulmonary disease) (HCC) 01/15/2014: Coronary artery disease     Comment:  a.) s/p NSTEMI 11/19/2021 --> IRA 99% RPDA not amenable               to PCI; b.) s/p NSTEMI 12/23/2021 --> LHC/PCI: PTCA D1               bifurcation/RI + PTCA/PCI D1 (2.25 x 12 mm Onyx DES) No date: Diverticulosis No date: Enlarged prostate No date: History of kidney stones No date: History of serving in [employer] ([place] veteran) No date: Hyperlipidemia No date: Hypertension No date: Long-term use of aspirin  therapy 11/19/2021: NSTEMI (non-ST elevated myocardial infarction) (HCC)     Comment:  a.) LHC 11/19/2021: 20% mLM, 40% pLAD, 80% lateral D1,               90% D1, and 80% RPDA --> Rx mgmt 01/14/2014: NSTEMI (non-ST elevated myocardial infarction) (HCC)     Comment:  a.) LHC 01/15/2014: 30-40% pLM, 30-40% mLAD, 80% D1,  40-50% p-mRCA, 99% RPDA  --> IRA was the RDPA, however it              was small and not amenable to PCI --> Rx mgmt No date: Pulmonary nodules No date: PVD (peripheral vascular disease) No date: Unstable angina [facility])  Past Surgical History: 07/24/2022: BIOPSY     Comment:  Procedure: BIOPSY;  Surgeon: Rollin Dover, MD;                Location: [facility];  Service: Gastroenterology;; 05/14/2023: BIOPSY     Comment:  Procedure: BIOPSY;  Surgeon: Rollin Dover, MD;                Location: [facility];  Service: Gastroenterology;; 09/17/2023: BIOPSY OF SKIN SUBCUTANEOUS TISSUE AND/OR MUCOUS MEMBRANE     Comment:  Procedure: BIOPSY,  SKIN, SUBCUTANEOUS TISSUE, OR MUCOUS               MEMBRANE;  Surgeon: Rollin Dover, MD;  Location: [facility]               END[facility]COPY;  Service: Gastroenterology;; 07/24/2022: COLONOSCOPY WITH PROPOFOL ; N/A     Comment:  Procedure: COLONOSCOPY WITH PROPOFOL ;  Surgeon: Rollin Dover, MD;  Location: [facility];  Service:               Gastroenterology;  Laterality: N/A; 12/23/2021: CORONARY STENT INTERVENTION; N/A     Comment:  Procedure: CORONARY STENT INTERVENTION;  Surgeon: Ladona Heinz, MD;  [facility]: [facility];  Service:               Cardiovascular;  Laterality: N/A; 05/14/2023: FLEXIBLE SIGMOIDOSCOPY; N/A     Comment:  Procedure: FLEXIBLE SIGMOIDOSCOPY;  Surgeon: Rollin Dover, MD;  Location: [facility];  Service:               Gastroenterology;  Laterality: N/A; 09/17/2023: FLEXIBLE SIGMOIDOSCOPY; N/A     Comment:  Procedure: SIGMOIDOSCOPY, FLEXIBLE;  Surgeon: Rollin Dover, MD;  Location: [facility];  Service:               Gastroenterology;  Laterality: N/A; 11/19/2021: LEFT HEART CATH AND CORONARY ANGIOGRAPHY; N/A     Comment:  Procedure: LEFT HEART CATH AND CORONARY ANGIOGRAPHY;                Surgeon: Elmira Newman PARAS, MD;  Location: [facility]              CV [facility];  Service: Cardiovascular;  Laterality: N/A; 01/15/2014: LEFT HEART CATHETERIZATION WITH CORONARY ANGIOGRAM; N/A     Comment:  Procedure: LEFT HEART CATHETERIZATION WITH CORONARY               ANGIOGRAM;  Surgeon: Ozell JONETTA Fell, MD;  Location: [facility]               CATH LAB;  Service: Cardiovascular;  Laterality: N/A; 08/18/2022: PORTACATH PLACEMENT; N/A     Comment:  Procedure: INSERTION PORT-A-CATH;  Surgeon: Teresa Lonni HERO, MD;  Location: [facility];  Service: General;  Laterality: N/A;  60  BMI    Body Mass Index: 25.82 kg/m      Reproductive/Obstetrics negative OB ROS                               Anesthesia Physical Anesthesia Plan  ASA: 3  Anesthesia Plan: General ETT   Post-op Pain Management:    Induction: Intravenous  PONV Risk Score and Plan: 2 and Dexamethasone  and Ondansetron   Airway Management Planned: Oral ETT  Additional Equipment:   Intra-op Plan:   Post-operative Plan: Extubation in OR  Informed Consent: I have reviewed the patients History and Physical, chart, labs and discussed the procedure including the risks, benefits and alternatives for the proposed anesthesia with the patient or authorized representative who has indicated his/her understanding and acceptance.     Dental Advisory Given  Plan Discussed with: Anesthesiologist, CRNA and Surgeon  Anesthesia Plan Comments: (Patient consented for risks of anesthesia including but not limited to:  - adverse reactions to medications - damage to eyes, teeth, lips or other oral mucosa - nerve damage due to positioning  - sore throat or hoarseness - Damage to heart, brain, nerves, lungs, other parts of body or loss of life  Patient voiced understanding and assent.)         Anesthesia Quick Evaluation

## 2024-01-26 ENCOUNTER — Encounter: Payer: Self-pay | Admitting: Student in an Organized Health Care Education/Training Program

## 2024-01-28 ENCOUNTER — Ambulatory Visit: Payer: Self-pay | Admitting: Student in an Organized Health Care Education/Training Program

## 2024-01-28 LAB — SURGICAL PATHOLOGY

## 2024-01-28 LAB — CYTOLOGY - NON PAP

## 2024-02-01 ENCOUNTER — Ambulatory Visit: Payer: Self-pay | Admitting: Physical Medicine and Rehabilitation

## 2024-02-01 DIAGNOSIS — M8448XA Pathological fracture, other site, initial encounter for fracture: Secondary | ICD-10-CM

## 2024-02-01 NOTE — Telephone Encounter (Signed)
 Called patient to review recent MRI findings of L sacral insufficiency fracture; will be referring to orthopedics, asked patient to call back if any preference. Will re-attempt calling him tomorrow afternoon if no response.

## 2024-02-03 DIAGNOSIS — M8448XA Pathological fracture, other site, initial encounter for fracture: Secondary | ICD-10-CM | POA: Insufficient documentation

## 2024-02-03 MED ORDER — OYSTER SHELL CALCIUM/D3 500-5 MG-MCG PO TABS: 1.0000 | ORAL_TABLET | Freq: Two times a day (BID) | ORAL | 5 refills | Status: AC

## 2024-02-03 NOTE — Addendum Note (Signed)
 Addended by: EMELINE SEARCH on: 02/03/2024 03:42 PM   Modules accepted: Orders

## 2024-02-03 NOTE — Telephone Encounter (Signed)
#  2 attempt to call patient regarding results of MRI; patient answered the phone.  Discussed results of MRI of the left hip showing sacral insufficiency fracture, recommending orthopedic referral, calcium  and vitamin D supplementation, and abstaining from physically demanding activities like lifting, twisting, and prolonged exertion.  Patient has no preference for orthopedic referral, will send today.  Patient requesting that [facility].  Also requests call to his wife at work today.   Joesph JAYSON Likes, DO 02/03/2024

## 2024-02-04 NOTE — Telephone Encounter (Signed)
 error

## 2024-02-07 ENCOUNTER — Other Ambulatory Visit: Payer: Self-pay

## 2024-02-07 NOTE — Progress Notes (Deleted)
  PROCEDURE RECORD [facility]   Name: Ronald [name] DOB:Nov 17, 1945 MRN: [id number]  Date:02/07/2024  Physician: Prentice Compton, MD    Nurse/CMA: ADIN RN  Allergies:  Allergies  Allergen Reactions   Angiotensin Receptor Blockers Diarrhea   Beta Adrenergic Blockers Other (See Comments)    Fatigue   Zestril  [Lisinopril ] Rash    Consent Signed: {yes wn:685467}  Is patient diabetic? {yes no:314532}  CBG today? ***  Pregnant: {yes no:314532} LMP: No LMP for male patient. (age 31-55)  Anticoagulants: {Yes/No:19989} Anti-inflammatory: {Yes/No:19989} Antibiotics: {Yes/No:19989}  Procedure: LEFT INTRA ARTICULAR JOINT INJECTION OF STEROID  Position: Supine Start Time: ***  End Time: ***  Fluoro Time: ***  RN/CMA Kaisy Severino RN Refujio Haymer RN    Time *** ***    BP *** ***    Pulse *** ***    Respirations *** ***    O2 Sat *** ***    S/S *** ***    Pain Level *** ***     D/C home with ***, patient A & O X 3, D/C instructions reviewed, and sits independently.

## 2024-02-08 ENCOUNTER — Inpatient Hospital Stay: Attending: Nurse Practitioner | Admitting: Oncology

## 2024-02-08 ENCOUNTER — Telehealth: Payer: Self-pay | Admitting: Physical Medicine & Rehabilitation

## 2024-02-08 ENCOUNTER — Encounter: Payer: Self-pay | Admitting: Oncology

## 2024-02-08 ENCOUNTER — Encounter: Attending: Physical Medicine and Rehabilitation | Admitting: Physical Medicine & Rehabilitation

## 2024-02-08 VITALS — BP 128/78 | HR 94 | Temp 97.8°F | Resp 18 | Ht 68.0 in | Wt 170.8 lb

## 2024-02-08 DIAGNOSIS — R102 Pelvic and perineal pain unspecified side: Secondary | ICD-10-CM | POA: Diagnosis not present

## 2024-02-08 DIAGNOSIS — I252 Old myocardial infarction: Secondary | ICD-10-CM | POA: Insufficient documentation

## 2024-02-08 DIAGNOSIS — I2510 Atherosclerotic heart disease of native coronary artery without angina pectoris: Secondary | ICD-10-CM | POA: Diagnosis not present

## 2024-02-08 DIAGNOSIS — F4310 Post-traumatic stress disorder, unspecified: Secondary | ICD-10-CM | POA: Insufficient documentation

## 2024-02-08 DIAGNOSIS — M25552 Pain in left hip: Secondary | ICD-10-CM | POA: Insufficient documentation

## 2024-02-08 DIAGNOSIS — C7801 Secondary malignant neoplasm of right lung: Secondary | ICD-10-CM | POA: Diagnosis not present

## 2024-02-08 DIAGNOSIS — N400 Enlarged prostate without lower urinary tract symptoms: Secondary | ICD-10-CM | POA: Insufficient documentation

## 2024-02-08 DIAGNOSIS — M5432 Sciatica, left side: Secondary | ICD-10-CM | POA: Insufficient documentation

## 2024-02-08 DIAGNOSIS — M1612 Unilateral primary osteoarthritis, left hip: Secondary | ICD-10-CM | POA: Insufficient documentation

## 2024-02-08 DIAGNOSIS — C20 Malignant neoplasm of rectum: Secondary | ICD-10-CM | POA: Insufficient documentation

## 2024-02-08 DIAGNOSIS — E785 Hyperlipidemia, unspecified: Secondary | ICD-10-CM | POA: Diagnosis not present

## 2024-02-08 DIAGNOSIS — I10 Essential (primary) hypertension: Secondary | ICD-10-CM | POA: Insufficient documentation

## 2024-02-08 MED ORDER — CLONIDINE HCL 0.3 MG PO TABS: 0.3000 mg | ORAL_TABLET | Freq: Two times a day (BID) | ORAL | 5 refills | Status: AC

## 2024-02-08 NOTE — Progress Notes (Signed)
 [facility] OFFICE PROGRESS NOTE   Diagnosis: Rectal cancer  INTERVAL HISTORY:   Ronald [name] returns as scheduled.  He generally feels well.  No cough.  He has pain at the left posterior pelvis.  He has been evaluated by rehabilitation medicine and found to have an insufficiency fracture in the left sacral alla.  An MRI revealed no evidence of metastatic disease..  The pelvic pain has improved. He underwent a navigation bronchoscopy and biopsy of a right lung nodule on 01/25/2024.  The pathology revealed adenocarcinoma consistent with metastatic colon cancer.  The tumor was positive for CDX2 and cytokeratin 20. He reports no difficulty with bowel function.  No bleeding.  He continues to have The symptoms in the extremities.  Cymbalta has helped. Objective:  Vital signs in last 24 hours:  Blood pressure 128/78, pulse 94, temperature 97.8 F (36.6 C), temperature source Temporal, resp. rate 18, height 5' 8 (1.727 m), weight 170 lb 12.8 oz (77.5 kg), SpO2 98%.     Lymphatics: No cervical, supraclavicular, axillary, or inguinal nodes Resp: Lungs clear bilaterally Cardio: Regular rate and rhythm GI: No hepatosplenomegaly Vascular: No leg edema  Portacath/PICC-without erythema  Lab Results:  Lab Results  Component Value Date   WBC 6.6 01/24/2024   HGB 12.0 (L) 01/24/2024   HCT 35.1 (L) 01/24/2024   MCV 95.4 01/24/2024   PLT 166 01/24/2024   NEUTROABS 2.7 05/25/2023    CMP  Lab Results  Component Value Date   NA 136 01/24/2024   K 3.6 01/24/2024   CL 101 01/24/2024   CO2 23 01/24/2024   GLUCOSE 138 (H) 01/24/2024   BUN 12 01/24/2024   CREATININE 0.99 01/24/2024   CALCIUM  8.4 (L) 01/24/2024   PROT 6.2 (L) 05/25/2023   ALBUMIN 4.2 05/25/2023   AST 18 05/25/2023   ALT 18 05/25/2023   ALKPHOS 65 05/25/2023   BILITOT 0.6 05/25/2023   GFRNONAA >60 01/24/2024   GFRAA 84 02/10/2019    Lab Results  Component Value Date   CEA1 23.9 (H) 07/24/2022   CEA  11.45 (H) 10/18/2023    Lab Results  Component Value Date   INR 0.93 01/14/2014   LABPROT 12.5 01/14/2014    Imaging:  No results found.  Medications: I have reviewed the patient's current medications.   Assessment/Plan:  Rectal cancer Colonoscopy 07/24/2022-mass in the posterior rectum extending from the dentate line for 3 cm, biopsy-moderately differentiated invasive adenocarcinoma, mismatch repair protein expression intact, microsatellite stable, Foundation 1-tumor mutation burden 14, KRAS G 12D, HRD signature negative CT abdomen/pelvis 04/07/2022-cardiac enlargement, hepatic steatosis mild left adrenal nodularity stable compared to chest CT March 2020, small nonobstructing bilateral renal calculi, extensive colonic diverticula CT chest 06/03/2022-6 mm nodule in the superior right lower lobe-new from 07/12/2018, new 1-2 mm nodule within the left upper lobe Elevated CEA 07/24/2022 MRI pelvis 08/06/2022-tumor at 3.5 cm from the anal verge with involvement of the upper anal sphincter, invasion of the inferior right levator ani muscle, T4b, one 5 mm left posterior perirectal node-N 1 CTs 08/19/2022-partially circumferential mass of the low rectum.  Small bilateral pulmonary nodules slightly enlarged compared to prior examination.  Plan for follow-up CT chest after completing 6 cycles of chemotherapy. Cycle 1 FOLFOX 08/24/2022 Cycle 2 FOLFOX 09/07/2022 Chemotherapy held 09/21/2022 due to severe neutropenia, thrombocytopenia Cycle 3 FOLFOX 10/05/2022, Udenyca , oxaliplatin  dose reduced secondary to thrombocytopenia following cycle 2 Treatment held 10/20/2022 due to thrombocytopenia Cycle 4 FOLFOX 10/27/2022, Udenyca , oxaliplatin  dose reduced secondary to thrombocytopenia  following cycle 3 Cycle 5 FOLFOX 11/09/2022, Udenyca  Cycle 6 FOLFOX 11/24/2022, Udenyca  Cycle 7 FOLFOX 12/08/2022, Udenyca  CTs 12/16/2022-similar appearance of circumferential low rectal wall mass; decreased size of scattered small bilateral  pulmonary nodules.  No new suspicious pulmonary nodules or masses.  No evidence of metastatic disease in the abdomen or pelvis. Cycle 8 FOLFOX 01/05/2023, 5-FU bolus eliminated, pump dose decreased due to diarrhea Radiation/Xeloda 02/08/2023-03/17/2023 CT chest/abdomen 04/17/2023: Stable pulmonary nodules no evidence of metastatic disease MRI pelvis 04/16/2023: Tumor at 3.5 cm from anal verge, decreased in thickness, decreased extension through the muscularis propria with 1 area of extension measuring 3 mm-potentially retraction scar, abutment of the mesorectal fascia at the 8 o'clock position, previously noted perirectal lymph node is no longer measurable, no new lymphadenopathy Flexible sigmoidoscopy 05/14/2023-small scar found in the rectum.  Scar tissue was healthy in appearance.  No evidence of residual cancer.  Biopsies obtained-colonic mucosa with hyperplastic and reactive changes.  Negative for dysplasia or malignancy. MRI pelvis 08/17/2023: Further reduction in thickness at the low rectum/anal canal, no lymphadenopathy 09/17/2023 flexible sigmoidoscopy-scar in the distal rectum, biopsy shows colonic mucosa with hyperplastic changes, negative for dysplasia 10/18/2023 CEA 11.45 CTs 10/18/2023-extensive multivessel coronary artery calcification.  No enlarged mediastinal or axillary lymph nodes.  Multiple randomly distributed nodules have developed within the lungs bilaterally.  Interval enlargement of the dominant nodule within the superior segment right lower lobe now measuring 11 mm PET 11/15/2023: Multiple lung nodules with increased metabolic activity, no other evidence of metastatic disease 01/18/2024 CT chest: Numerous progressive bilateral pulmonary nodules 01/25/2024 bronchoscopy with biopsy of right lower lobe nodule: Forcep biopsy-metastatic moderately differentiated adenocarcinoma consistent with colonic primary, CDX2 and CK20 positive.  Cytology-adenocarcinoma, consistent with metastatic colonic  adenocarcinoma-CDX2 and CK20 positive   CAD, status post myocardial infarction 2015 and July 2023, D1 branch LAD angioplasty 12/23/2021 Hyperlipidemia Hypertension PTSD BPH Cigar use Pelvic pain: MRI left hip 01/24/2024: Insufficiency fracture at the left sacral alla, hyperintensity in the central sacrum and right pubic symphysis consistent with stress    Disposition: Ronald [name] has been diagnosed with metastatic rectal cancer.  I discussed the pathology from the bronchoscopy with him.  We reviewed the recent CT chest images.  He is not a candidate for resection or radiation of the lung lesions.  He understands no therapy will be curative.  The goals of treatment are to palliate symptoms and potentially extend survival.  We discussed observation versus beginning systemic therapy.  I recommend beginning treatment given the extent of the lung metastases.  I recommend FOLFIRI/bevacizumab.  We reviewed potential toxicities associated with the FOLFIRI regimen including the chance of nausea/vomiting, diarrhea, alopecia, hematologic toxicity, infection, and bleeding.  We reviewed the hypertension, bleeding, thromboembolic disease, bowel perforation, nephrotoxicity, allergic reaction, and CNS toxicity associated with bevacizumab.  He agrees to proceed.  Ronald [name] would like to begin treatment 03/07/2024.  He plans to follow-up with the [facility] oncology clinic in the interim.  He plans to continue observation for the pelvic stress fractures.  These may be related to radiation.  He will be scheduled for an office visit, chemotherapy, CEA and a baseline chest CT  on 03/07/2024.  A treatment plan was entered today.  Arley Hof, MD  02/08/2024  2:42 PM

## 2024-02-08 NOTE — Progress Notes (Signed)
 DISCONTINUE ON PATHWAY REGIMEN - Colorectal     A cycle is every 14 days:     Oxaliplatin       Leucovorin       Fluorouracil       Fluorouracil    **Always confirm dose/schedule in your pharmacy ordering system**  PRIOR TREATMENT: ROS56: mFOLFOX6 q14 Days x 4 Months  START ON PATHWAY REGIMEN - Colorectal     A cycle is every 14 days:     Bevacizumab-xxxx      Irinotecan      Leucovorin       Fluorouracil       Fluorouracil    **Always confirm dose/schedule in your pharmacy ordering system**  Patient Characteristics: Distant Metastases, Nonsurgical Candidate, Non-KRAS G12C, RAS Mutation Positive/Unknown (BRAF V600 Wild-Type/Unknown), Standard Cytotoxic Therapy, Second Line Standard Cytotoxic Therapy, Bevacizumab Eligible Tumor Location: Rectal Therapeutic Status: Distant Metastases Microsatellite/Mismatch Repair Status: MSS/pMMR BRAF Mutation Status: Wild-Type (no mutation) KRAS/NRAS Mutation Status: Non-KRAS G12C, RAS Mutation Positive Preferred Therapy Approach: Standard Cytotoxic Therapy Standard Cytotoxic Line of Therapy: Second Line Standard Cytotoxic Therapy Bevacizumab Eligibility: Eligible Intent of Therapy: Non-Curative / Palliative Intent, Discussed with Patient

## 2024-02-08 NOTE — Telephone Encounter (Signed)
 Ps spouse asked to speak to someone bc she said she is a little confused about why it was a appt scheduled for today. She said she thought nothing else could be done and pt has a hip fracture so not for sure if a injection would be best.

## 2024-02-09 ENCOUNTER — Encounter: Payer: Self-pay | Admitting: *Deleted

## 2024-02-10 ENCOUNTER — Other Ambulatory Visit: Payer: Self-pay

## 2024-02-15 ENCOUNTER — Ambulatory Visit: Admitting: Physician Assistant

## 2024-02-17 ENCOUNTER — Telehealth: Payer: Self-pay | Admitting: Oncology

## 2024-02-17 NOTE — Telephone Encounter (Signed)
Left message on wife voice mail

## 2024-02-23 ENCOUNTER — Encounter: Admitting: Physical Medicine and Rehabilitation

## 2024-02-25 NOTE — Progress Notes (Signed)
 Pharmacist Chemotherapy Monitoring - Initial Assessment    Anticipated start date: 03/07/24   The following has been reviewed per standard work regarding the patient's treatment regimen: The patient's diagnosis, treatment plan and drug doses, and organ/hematologic function Lab orders and baseline tests specific to treatment regimen  The treatment plan start date, drug sequencing, and pre-medications Prior authorization status  Patient's documented medication list, including drug-drug interaction screen and prescriptions for anti-emetics and supportive care specific to the treatment regimen The drug concentrations, fluid compatibility, administration routes, and timing of the medications to be used The patient's access for treatment and lifetime cumulative dose history, if applicable  The patient's medication allergies and previous infusion related reactions, if applicable   Changes made to treatment plan:  N/A  Follow up needed:  N/A   Ronald [name], RPH, 02/25/2024  2:55 PM

## 2024-03-04 ENCOUNTER — Other Ambulatory Visit: Payer: Self-pay | Admitting: Oncology

## 2024-03-04 DIAGNOSIS — C20 Malignant neoplasm of rectum: Secondary | ICD-10-CM

## 2024-03-06 ENCOUNTER — Inpatient Hospital Stay

## 2024-03-06 ENCOUNTER — Encounter: Payer: Self-pay | Admitting: Radiology

## 2024-03-06 ENCOUNTER — Inpatient Hospital Stay: Attending: Nurse Practitioner | Admitting: Nurse Practitioner

## 2024-03-06 ENCOUNTER — Ambulatory Visit (HOSPITAL_BASED_OUTPATIENT_CLINIC_OR_DEPARTMENT_OTHER)
Admission: RE | Admit: 2024-03-06 | Discharge: 2024-03-06 | Disposition: A | Discharge status: Home / Self Care | Source: Ambulatory Visit | Attending: Oncology | Admitting: Oncology

## 2024-03-06 ENCOUNTER — Encounter: Payer: Self-pay | Admitting: Nurse Practitioner

## 2024-03-06 VITALS — BP 116/74 | HR 78 | Temp 97.6°F | Resp 18 | Ht 68.0 in | Wt 172.1 lb

## 2024-03-06 DIAGNOSIS — C20 Malignant neoplasm of rectum: Secondary | ICD-10-CM | POA: Insufficient documentation

## 2024-03-06 LAB — CBC WITH DIFFERENTIAL (CANCER CENTER ONLY)
Abs Immature Granulocytes: 0.02 K/uL (ref 0.00–0.07)
Basophils Absolute: 0 K/uL (ref 0.0–0.1)
Basophils Relative: 1 %
Eosinophils Absolute: 0.1 K/uL (ref 0.0–0.5)
Eosinophils Relative: 2 %
HCT: 34.2 % — ABNORMAL LOW (ref 39.0–52.0)
Hemoglobin: 12 g/dL — ABNORMAL LOW (ref 13.0–17.0)
Immature Granulocytes: 0 %
Lymphocytes Relative: 27 %
Lymphs Abs: 1.5 K/uL (ref 0.7–4.0)
MCH: 33.1 pg (ref 26.0–34.0)
MCHC: 35.1 g/dL (ref 30.0–36.0)
MCV: 94.5 fL (ref 80.0–100.0)
Monocytes Absolute: 0.8 K/uL (ref 0.1–1.0)
Monocytes Relative: 14 %
Neutro Abs: 3.2 K/uL (ref 1.7–7.7)
Neutrophils Relative %: 56 %
Platelet Count: 170 K/uL (ref 150–400)
RBC: 3.62 MIL/uL — ABNORMAL LOW (ref 4.22–5.81)
RDW: 12.4 % (ref 11.5–15.5)
WBC Count: 5.6 K/uL (ref 4.0–10.5)
nRBC: 0 % (ref 0.0–0.2)

## 2024-03-06 LAB — CMP (CANCER CENTER ONLY)
ALT: 30 U/L (ref 0–44)
AST: 27 U/L (ref 15–41)
Albumin: 4.2 g/dL (ref 3.5–5.0)
Alkaline Phosphatase: 74 U/L (ref 38–126)
Anion gap: 8 (ref 5–15)
BUN: 13 mg/dL (ref 8–23)
CO2: 24 mmol/L (ref 22–32)
Calcium: 9.2 mg/dL (ref 8.9–10.3)
Chloride: 103 mmol/L (ref 98–111)
Creatinine: 0.87 mg/dL (ref 0.61–1.24)
GFR, Estimated: >60 mL/min (ref >60)
Glucose, Bld: 144 mg/dL — ABNORMAL HIGH (ref 70–99)
Potassium: 4.1 mmol/L (ref 3.5–5.1)
Sodium: 135 mmol/L (ref 135–145)
Total Bilirubin: 0.3 mg/dL (ref 0.0–1.2)
Total Protein: 6.4 g/dL — ABNORMAL LOW (ref 6.5–8.1)

## 2024-03-06 LAB — TOTAL PROTEIN, URINE DIPSTICK: Protein, ur: NEGATIVE mg/dL

## 2024-03-06 LAB — CEA (ACCESS): CEA (CHCC): 39.06 ng/mL — ABNORMAL HIGH (ref 0.00–5.00)

## 2024-03-06 NOTE — Progress Notes (Signed)
 [facility] OFFICE PROGRESS NOTE   Diagnosis: Rectal cancer  INTERVAL HISTORY:   Ronald [name] returns as scheduled.  He is scheduled to begin treatment today with FOLFIRI/bevacizumab.  He feels well.  Stable dyspnea on exertion.  No cough.  He has a good appetite.  Stable pain left hip.  Objective:  Vital signs in last 24 hours:  Blood pressure 116/74, pulse 78, temperature 97.6 F (36.4 C), temperature source Temporal, resp. rate 18, height 5' 8 (1.727 m), weight 172 lb 1.6 oz (78.1 kg), SpO2 99%.    HEENT: No thrush or ulcers. Resp: Lungs clear bilaterally. Cardio: Regular rate and rhythm. GI: No hepatosplenomegaly. Vascular: No leg edema. Port-A-Cath without erythema.  Lab Results:  Lab Results  Component Value Date   WBC 5.6 03/06/2024   HGB 12.0 (L) 03/06/2024   HCT 34.2 (L) 03/06/2024   MCV 94.5 03/06/2024   PLT 170 03/06/2024   NEUTROABS 3.2 03/06/2024    Imaging:  No results found.  Medications: I have reviewed the patient's current medications.  Assessment/Plan: Rectal cancer Colonoscopy 07/24/2022-mass in the posterior rectum extending from the dentate line for 3 cm, biopsy-moderately differentiated invasive adenocarcinoma, mismatch repair protein expression intact, microsatellite stable, Foundation 1-tumor mutation burden 14, KRAS G 12D, HRD signature negative CT abdomen/pelvis 04/07/2022-cardiac enlargement, hepatic steatosis mild left adrenal nodularity stable compared to chest CT March 2020, small nonobstructing bilateral renal calculi, extensive colonic diverticula CT chest 06/03/2022-6 mm nodule in the superior right lower lobe-new from 07/12/2018, new 1-2 mm nodule within the left upper lobe Elevated CEA 07/24/2022 MRI pelvis 08/06/2022-tumor at 3.5 cm from the anal verge with involvement of the upper anal sphincter, invasion of the inferior right levator ani muscle, T4b, one 5 mm left posterior perirectal node-N 1 CTs 08/19/2022-partially  circumferential mass of the low rectum.  Small bilateral pulmonary nodules slightly enlarged compared to prior examination.  Plan for follow-up CT chest after completing 6 cycles of chemotherapy. Cycle 1 FOLFOX 08/24/2022 Cycle 2 FOLFOX 09/07/2022 Chemotherapy held 09/21/2022 due to severe neutropenia, thrombocytopenia Cycle 3 FOLFOX 10/05/2022, Udenyca , oxaliplatin  dose reduced secondary to thrombocytopenia following cycle 2 Treatment held 10/20/2022 due to thrombocytopenia Cycle 4 FOLFOX 10/27/2022, Udenyca , oxaliplatin  dose reduced secondary to thrombocytopenia following cycle 3 Cycle 5 FOLFOX 11/09/2022, Udenyca  Cycle 6 FOLFOX 11/24/2022, Udenyca  Cycle 7 FOLFOX 12/08/2022, Udenyca  CTs 12/16/2022-similar appearance of circumferential low rectal wall mass; decreased size of scattered small bilateral pulmonary nodules.  No new suspicious pulmonary nodules or masses.  No evidence of metastatic disease in the abdomen or pelvis. Cycle 8 FOLFOX 01/05/2023, 5-FU bolus eliminated, pump dose decreased due to diarrhea Radiation/Xeloda 02/08/2023-03/17/2023 CT chest/abdomen 04/17/2023: Stable pulmonary nodules no evidence of metastatic disease MRI pelvis 04/16/2023: Tumor at 3.5 cm from anal verge, decreased in thickness, decreased extension through the muscularis propria with 1 area of extension measuring 3 mm-potentially retraction scar, abutment of the mesorectal fascia at the 8 o'clock position, previously noted perirectal lymph node is no longer measurable, no new lymphadenopathy Flexible sigmoidoscopy 05/14/2023-small scar found in the rectum.  Scar tissue was healthy in appearance.  No evidence of residual cancer.  Biopsies obtained-colonic mucosa with hyperplastic and reactive changes.  Negative for dysplasia or malignancy. MRI pelvis 08/17/2023: Further reduction in thickness at the low rectum/anal canal, no lymphadenopathy 09/17/2023 flexible sigmoidoscopy-scar in the distal rectum, biopsy shows colonic mucosa with  hyperplastic changes, negative for dysplasia 10/18/2023 CEA 11.45 CTs 10/18/2023-extensive multivessel coronary artery calcification.  No enlarged mediastinal or axillary lymph nodes.  Multiple randomly distributed nodules have developed within the lungs bilaterally.  Interval enlargement of the dominant nodule within the superior segment right lower lobe now measuring 11 mm PET 11/15/2023: Multiple lung nodules with increased metabolic activity, no other evidence of metastatic disease 01/18/2024 CT chest: Numerous progressive bilateral pulmonary nodules 01/25/2024 bronchoscopy with biopsy of right lower lobe nodule: Forcep biopsy-metastatic moderately differentiated adenocarcinoma consistent with colonic primary, CDX2 and CK20 positive.  Cytology-adenocarcinoma, consistent with metastatic colonic adenocarcinoma-CDX2 and CK20 positive   CAD, status post myocardial infarction 2015 and July 2023, D1 branch LAD angioplasty 12/23/2021 Hyperlipidemia Hypertension PTSD BPH Cigar use Pelvic pain: MRI left hip 01/24/2024: Insufficiency fracture at the left sacral alla, hyperintensity in the central sacrum and right pubic symphysis consistent with stress      Disposition: Ronald [name] appears stable.  He is scheduled to begin treatment today with FOLFIRI/bevacizumab.  He would like to cancel today's treatment and come back in 2 weeks for cycle 1.  He has a project he would like to complete and is concerned he will be unable to do so once he starts chemotherapy.  He will return for follow-up and cycle 1 FOLFIRI/bevacizumab in 2 weeks.  He will contact the office in the interim with any problems.  Plan reviewed with Dr. Cloretta.  Ronald [name] ANP/GNP-BC   03/06/2024  10:08 AM

## 2024-03-06 NOTE — Patient Instructions (Signed)

## 2024-03-07 ENCOUNTER — Ambulatory Visit (HOSPITAL_BASED_OUTPATIENT_CLINIC_OR_DEPARTMENT_OTHER): Admission: RE | Admit: 2024-03-07 | Source: Ambulatory Visit

## 2024-03-07 ENCOUNTER — Other Ambulatory Visit: Payer: Self-pay

## 2024-03-08 ENCOUNTER — Other Ambulatory Visit: Payer: Self-pay

## 2024-03-08 ENCOUNTER — Encounter

## 2024-03-10 NOTE — Progress Notes (Signed)
 Pharmacist Chemotherapy Monitoring - Initial Assessment    Anticipated start date: 03/21/24   The following has been reviewed per standard work regarding the patient's treatment regimen: The patient's diagnosis, treatment plan and drug doses, and organ/hematologic function Lab orders and baseline tests specific to treatment regimen  The treatment plan start date, drug sequencing, and pre-medications Prior authorization status  Patient's documented medication list, including drug-drug interaction screen and prescriptions for anti-emetics and supportive care specific to the treatment regimen The drug concentrations, fluid compatibility, administration routes, and timing of the medications to be used The patient's access for treatment and lifetime cumulative dose history, if applicable  The patient's medication allergies and previous infusion related reactions, if applicable   Changes made to treatment plan:  N/A  Follow up needed:  N/A   Eulalia Ellerman Kreul, RPH, 03/10/2024  11:20 AM

## 2024-03-15 ENCOUNTER — Telehealth: Payer: Self-pay | Admitting: Oncology

## 2024-03-15 NOTE — Telephone Encounter (Signed)
 PT called to cancel appts, wants to come back at the beg. of December. He is already sch and good to go.

## 2024-03-21 ENCOUNTER — Other Ambulatory Visit

## 2024-03-21 ENCOUNTER — Telehealth: Payer: Self-pay | Admitting: *Deleted

## 2024-03-21 ENCOUNTER — Ambulatory Visit

## 2024-03-21 ENCOUNTER — Ambulatory Visit: Admitting: Nurse Practitioner

## 2024-03-21 NOTE — Telephone Encounter (Signed)
 [name] dropped off letter with request for Dr. Cloretta to dictate a letter re: having stage IV lung cancer and possibility of being caused by his exposure in [place]. Per Dr. Cloretta, he will not dictate letter. 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 Left him a clinical cytogeneticist message.

## 2024-03-23 ENCOUNTER — Encounter

## 2024-03-24 ENCOUNTER — Other Ambulatory Visit: Payer: Self-pay

## 2024-04-04 ENCOUNTER — Inpatient Hospital Stay: Admitting: Nurse Practitioner

## 2024-04-04 ENCOUNTER — Inpatient Hospital Stay: Attending: Nurse Practitioner

## 2024-04-04 ENCOUNTER — Telehealth: Payer: Self-pay

## 2024-04-04 ENCOUNTER — Inpatient Hospital Stay

## 2024-04-04 ENCOUNTER — Encounter: Payer: Self-pay | Admitting: Nurse Practitioner

## 2024-04-04 VITALS — BP 126/66 | HR 85 | Temp 97.8°F | Resp 18 | Ht 68.0 in | Wt 176.1 lb

## 2024-04-04 DIAGNOSIS — I10 Essential (primary) hypertension: Secondary | ICD-10-CM | POA: Insufficient documentation

## 2024-04-04 DIAGNOSIS — I739 Peripheral vascular disease, unspecified: Secondary | ICD-10-CM | POA: Insufficient documentation

## 2024-04-04 DIAGNOSIS — F17210 Nicotine dependence, cigarettes, uncomplicated: Secondary | ICD-10-CM | POA: Diagnosis not present

## 2024-04-04 DIAGNOSIS — C20 Malignant neoplasm of rectum: Secondary | ICD-10-CM

## 2024-04-04 DIAGNOSIS — Z87442 Personal history of urinary calculi: Secondary | ICD-10-CM | POA: Diagnosis not present

## 2024-04-04 DIAGNOSIS — R918 Other nonspecific abnormal finding of lung field: Secondary | ICD-10-CM

## 2024-04-04 DIAGNOSIS — Z79899 Other long term (current) drug therapy: Secondary | ICD-10-CM | POA: Insufficient documentation

## 2024-04-04 DIAGNOSIS — I252 Old myocardial infarction: Secondary | ICD-10-CM | POA: Insufficient documentation

## 2024-04-04 DIAGNOSIS — G629 Polyneuropathy, unspecified: Secondary | ICD-10-CM | POA: Diagnosis not present

## 2024-04-04 DIAGNOSIS — F4310 Post-traumatic stress disorder, unspecified: Secondary | ICD-10-CM | POA: Diagnosis not present

## 2024-04-04 DIAGNOSIS — L989 Disorder of the skin and subcutaneous tissue, unspecified: Secondary | ICD-10-CM

## 2024-04-04 DIAGNOSIS — Z5111 Encounter for antineoplastic chemotherapy: Secondary | ICD-10-CM | POA: Diagnosis present

## 2024-04-04 DIAGNOSIS — J449 Chronic obstructive pulmonary disease, unspecified: Secondary | ICD-10-CM | POA: Insufficient documentation

## 2024-04-04 DIAGNOSIS — I2510 Atherosclerotic heart disease of native coronary artery without angina pectoris: Secondary | ICD-10-CM | POA: Insufficient documentation

## 2024-04-04 DIAGNOSIS — C7801 Secondary malignant neoplasm of right lung: Secondary | ICD-10-CM | POA: Diagnosis not present

## 2024-04-04 DIAGNOSIS — N400 Enlarged prostate without lower urinary tract symptoms: Secondary | ICD-10-CM | POA: Diagnosis not present

## 2024-04-04 DIAGNOSIS — E785 Hyperlipidemia, unspecified: Secondary | ICD-10-CM | POA: Insufficient documentation

## 2024-04-04 LAB — CBC WITH DIFFERENTIAL (CANCER CENTER ONLY)
Abs Immature Granulocytes: 0.03 K/uL (ref 0.00–0.07)
Basophils Absolute: 0 K/uL (ref 0.0–0.1)
Basophils Relative: 0 %
Eosinophils Absolute: 0.1 K/uL (ref 0.0–0.5)
Eosinophils Relative: 2 %
HCT: 35.5 % — ABNORMAL LOW (ref 39.0–52.0)
Hemoglobin: 12.4 g/dL — ABNORMAL LOW (ref 13.0–17.0)
Immature Granulocytes: 1 %
Lymphocytes Relative: 27 %
Lymphs Abs: 1.5 K/uL (ref 0.7–4.0)
MCH: 32.5 pg (ref 26.0–34.0)
MCHC: 34.9 g/dL (ref 30.0–36.0)
MCV: 93.2 fL (ref 80.0–100.0)
Monocytes Absolute: 0.5 K/uL (ref 0.1–1.0)
Monocytes Relative: 10 %
Neutro Abs: 3.2 K/uL (ref 1.7–7.7)
Neutrophils Relative %: 60 %
Platelet Count: 177 K/uL (ref 150–400)
RBC: 3.81 MIL/uL — ABNORMAL LOW (ref 4.22–5.81)
RDW: 12 % (ref 11.5–15.5)
WBC Count: 5.4 K/uL (ref 4.0–10.5)
nRBC: 0 % (ref 0.0–0.2)

## 2024-04-04 LAB — CMP (CANCER CENTER ONLY)
ALT: 30 U/L (ref 0–44)
AST: 28 U/L (ref 15–41)
Albumin: 4.1 g/dL (ref 3.5–5.0)
Alkaline Phosphatase: 77 U/L (ref 38–126)
Anion gap: 11 (ref 5–15)
BUN: 13 mg/dL (ref 8–23)
CO2: 23 mmol/L (ref 22–32)
Calcium: 9.3 mg/dL (ref 8.9–10.3)
Chloride: 104 mmol/L (ref 98–111)
Creatinine: 0.89 mg/dL (ref 0.61–1.24)
GFR, Estimated: >60 mL/min (ref >60)
Glucose, Bld: 166 mg/dL — ABNORMAL HIGH (ref 70–99)
Potassium: 3.7 mmol/L (ref 3.5–5.1)
Sodium: 138 mmol/L (ref 135–145)
Total Bilirubin: 0.4 mg/dL (ref 0.0–1.2)
Total Protein: 6.7 g/dL (ref 6.5–8.1)

## 2024-04-04 LAB — CEA (ACCESS): CEA (CHCC): 44.23 ng/mL — ABNORMAL HIGH (ref 0.00–5.00)

## 2024-04-04 MED ORDER — ONDANSETRON HCL 8 MG PO TABS: 8.0000 mg | ORAL_TABLET | Freq: Three times a day (TID) | ORAL | 1 refills | Status: AC | PRN

## 2024-04-04 MED ORDER — PROCHLORPERAZINE MALEATE 10 MG PO TABS: 10.0000 mg | ORAL_TABLET | Freq: Four times a day (QID) | ORAL | 1 refills | Status: AC | PRN

## 2024-04-04 NOTE — Telephone Encounter (Signed)
 Patient left the clinic today without receiving treatment and requested to be rescheduled. Appointment has been rescheduled to Monday, 04/10/2024, at 0830. Attempted to contact the patient by cell phone to relay this information; unable to reach him, and a voicemail was left.  Scheduler notified to follow up with the patient and to move the patient's appointments scheduled for 04/19/2024 to the following week due to this change. Provider and provider's nurse have been informed.

## 2024-04-04 NOTE — Patient Instructions (Signed)

## 2024-04-04 NOTE — Progress Notes (Signed)
 Patient seen by Olam Ned NP today  Vitals are within treatment parameters:Yes   Labs are within treatment parameters: Yes   Treatment plan has been signed: Yes   Per physician team, Patient is ready for treatment and there are NO modifications to the treatment plan. Needs a baseline urine protein today--orders are in.

## 2024-04-04 NOTE — Telephone Encounter (Signed)
 Patient left the clinic earlier today without receiving treatment and with his port-a-cath still accessed. Attempted to reach the patient twice by phone without success. Successfully contacted the patient's wife, who stated she would reach him and have him return to the clinic to be deaccessed.

## 2024-04-04 NOTE — Progress Notes (Addendum)
 [facility] OFFICE PROGRESS NOTE   Diagnosis: Rectal cancer  INTERVAL HISTORY:   Ronald [name] returns as scheduled.  He was scheduled to begin FOLFIRI/bevacizumab about 2 weeks ago.  He contacted the office 03/15/2024 to cancel and reschedule to today.  He feels well.  No nausea or vomiting.  No diarrhea.  No fever, cough, shortness of breath.  No abdominal pain.  No bleeding.  He reports a good appetite.  He is gaining weight.  He notes numbness/tingling in his hands and feet at nighttime.  He is concerned about a persistent scab at the top of his scalp.  No associated pruritus or bleeding.  Objective:  Vital signs in last 24 hours:  Blood pressure 126/66, pulse 85, temperature 97.8 F (36.6 C), temperature source Temporal, resp. rate 18, height 5' 8 (1.727 m), weight 176 lb 1.6 oz (79.9 kg), SpO2 98%.    HEENT: No thrush or ulcers. Resp: Lungs clear bilaterally. Cardio: Regular rate and rhythm. GI: No hepatosplenomegaly. Vascular: No leg edema. Neuro: Alert and oriented. Skin: Palms without erythema.  Small scabbed lesion crown/vertex scalp. Port-A-Cath without erythema.  Lab Results:  Lab Results  Component Value Date   WBC 5.4 04/04/2024   HGB 12.4 (L) 04/04/2024   HCT 35.5 (L) 04/04/2024   MCV 93.2 04/04/2024   PLT 177 04/04/2024   NEUTROABS 3.2 04/04/2024    Imaging:  No results found.  Medications: I have reviewed the patient's current medications.  Assessment/Plan: Rectal cancer Colonoscopy 07/24/2022-mass in the posterior rectum extending from the dentate line for 3 cm, biopsy-moderately differentiated invasive adenocarcinoma, mismatch repair protein expression intact, microsatellite stable, Foundation 1-tumor mutation burden 14, KRAS G 12D, HRD signature negative CT abdomen/pelvis 04/07/2022-cardiac enlargement, hepatic steatosis mild left adrenal nodularity stable compared to chest CT March 2020, small nonobstructing bilateral renal calculi,  extensive colonic diverticula CT chest 06/03/2022-6 mm nodule in the superior right lower lobe-new from 07/12/2018, new 1-2 mm nodule within the left upper lobe Elevated CEA 07/24/2022 MRI pelvis 08/06/2022-tumor at 3.5 cm from the anal verge with involvement of the upper anal sphincter, invasion of the inferior right levator ani muscle, T4b, one 5 mm left posterior perirectal node-N 1 CTs 08/19/2022-partially circumferential mass of the low rectum.  Small bilateral pulmonary nodules slightly enlarged compared to prior examination.  Plan for follow-up CT chest after completing 6 cycles of chemotherapy. Cycle 1 FOLFOX 08/24/2022 Cycle 2 FOLFOX 09/07/2022 Chemotherapy held 09/21/2022 due to severe neutropenia, thrombocytopenia Cycle 3 FOLFOX 10/05/2022, Udenyca , oxaliplatin  dose reduced secondary to thrombocytopenia following cycle 2 Treatment held 10/20/2022 due to thrombocytopenia Cycle 4 FOLFOX 10/27/2022, Udenyca , oxaliplatin  dose reduced secondary to thrombocytopenia following cycle 3 Cycle 5 FOLFOX 11/09/2022, Udenyca  Cycle 6 FOLFOX 11/24/2022, Udenyca  Cycle 7 FOLFOX 12/08/2022, Udenyca  CTs 12/16/2022-similar appearance of circumferential low rectal wall mass; decreased size of scattered small bilateral pulmonary nodules.  No new suspicious pulmonary nodules or masses.  No evidence of metastatic disease in the abdomen or pelvis. Cycle 8 FOLFOX 01/05/2023, 5-FU bolus eliminated, pump dose decreased due to diarrhea Radiation/Xeloda 02/08/2023-03/17/2023 CT chest/abdomen 04/17/2023: Stable pulmonary nodules no evidence of metastatic disease MRI pelvis 04/16/2023: Tumor at 3.5 cm from anal verge, decreased in thickness, decreased extension through the muscularis propria with 1 area of extension measuring 3 mm-potentially retraction scar, abutment of the mesorectal fascia at the 8 o'clock position, previously noted perirectal lymph node is no longer measurable, no new lymphadenopathy Flexible sigmoidoscopy  05/14/2023-small scar found in the rectum.  Scar tissue was  healthy in appearance.  No evidence of residual cancer.  Biopsies obtained-colonic mucosa with hyperplastic and reactive changes.  Negative for dysplasia or malignancy. MRI pelvis 08/17/2023: Further reduction in thickness at the low rectum/anal canal, no lymphadenopathy 09/17/2023 flexible sigmoidoscopy-scar in the distal rectum, biopsy shows colonic mucosa with hyperplastic changes, negative for dysplasia 10/18/2023 CEA 11.45 CTs 10/18/2023-extensive multivessel coronary artery calcification.  No enlarged mediastinal or axillary lymph nodes.  Multiple randomly distributed nodules have developed within the lungs bilaterally.  Interval enlargement of the dominant nodule within the superior segment right lower lobe now measuring 11 mm PET 11/15/2023: Multiple lung nodules with increased metabolic activity, no other evidence of metastatic disease 01/18/2024 CT chest: Numerous progressive bilateral pulmonary nodules 01/25/2024 bronchoscopy with biopsy of right lower lobe nodule: Forcep biopsy-metastatic moderately differentiated adenocarcinoma consistent with colonic primary, CDX2 and CK20 positive.  Cytology-adenocarcinoma, consistent with metastatic colonic adenocarcinoma-CDX2 and CK20 positive CT chest 03/06/2024-multiple solid bilateral pulmonary nodules again visualized, grossly stable.  New superior right lower lobe 15 mm pulmonary nodule.   CAD, status post myocardial infarction 2015 and July 2023, D1 branch LAD angioplasty 12/23/2021 Hyperlipidemia Hypertension PTSD BPH Cigar use Pelvic pain: MRI left hip 01/24/2024: Insufficiency fracture at the left sacral alla, hyperintensity in the central sacrum and right pubic symphysis consistent with stress      Disposition: Mr. Geer appears stable.  He is scheduled to begin treatment today with FOLFIRI/bevacizumab.  We again reviewed potential toxicities.  He agrees to proceed.  New prescriptions  were sent to his pharmacy for Compazine  and Zofran , dosing instructions reviewed.  CBC reviewed.  Counts are adequate for treatment.  Chemistry panel is pending.  He will return for follow-up and cycle 2 FOLFIRI/bevacizumab in 2 weeks.  We are available to see him sooner if needed.  Plan reviewed with Dr. Cloretta.   Donta Fuster ANP/GNP-BC   04/04/2024  10:27 AM  Addendum 1:05 PM-received notification that Mr. Brau left the office before receiving his treatment, requested to be rescheduled.  We will ask the scheduling team to contact him.  LT

## 2024-04-05 ENCOUNTER — Other Ambulatory Visit: Payer: Self-pay

## 2024-04-07 ENCOUNTER — Inpatient Hospital Stay

## 2024-04-07 ENCOUNTER — Telehealth: Payer: Self-pay

## 2024-04-07 NOTE — Telephone Encounter (Signed)
 A call was placed to the patient and his spouse regarding the appointment rescheduled from this week to Monday, 04/10/2024, to confirm attendance. Unable to reach either party; a voicemail message was left.

## 2024-04-10 ENCOUNTER — Inpatient Hospital Stay

## 2024-04-10 ENCOUNTER — Telehealth: Payer: Self-pay | Admitting: Nurse Practitioner

## 2024-04-12 ENCOUNTER — Inpatient Hospital Stay

## 2024-04-15 ENCOUNTER — Other Ambulatory Visit: Payer: Self-pay | Admitting: Oncology

## 2024-04-17 ENCOUNTER — Inpatient Hospital Stay

## 2024-04-18 ENCOUNTER — Inpatient Hospital Stay

## 2024-04-18 ENCOUNTER — Inpatient Hospital Stay: Admitting: Nurse Practitioner

## 2024-04-19 ENCOUNTER — Encounter: Payer: Self-pay | Admitting: Nurse Practitioner

## 2024-04-19 ENCOUNTER — Inpatient Hospital Stay

## 2024-04-19 ENCOUNTER — Inpatient Hospital Stay: Admitting: Nurse Practitioner

## 2024-04-19 VITALS — BP 121/64 | HR 76 | Temp 98.4°F | Resp 16 | Wt 174.5 lb

## 2024-04-19 VITALS — BP 169/77 | HR 64 | Temp 97.7°F | Resp 18

## 2024-04-19 DIAGNOSIS — C20 Malignant neoplasm of rectum: Secondary | ICD-10-CM | POA: Diagnosis not present

## 2024-04-19 DIAGNOSIS — Z5111 Encounter for antineoplastic chemotherapy: Secondary | ICD-10-CM | POA: Diagnosis not present

## 2024-04-19 DIAGNOSIS — R918 Other nonspecific abnormal finding of lung field: Secondary | ICD-10-CM

## 2024-04-19 LAB — CBC WITH DIFFERENTIAL (CANCER CENTER ONLY)
Abs Immature Granulocytes: 0.02 K/uL (ref 0.00–0.07)
Basophils Absolute: 0 K/uL (ref 0.0–0.1)
Basophils Relative: 0 %
Eosinophils Absolute: 0.1 K/uL (ref 0.0–0.5)
Eosinophils Relative: 1 %
HCT: 32.7 % — ABNORMAL LOW (ref 39.0–52.0)
Hemoglobin: 11.6 g/dL — ABNORMAL LOW (ref 13.0–17.0)
Immature Granulocytes: 0 %
Lymphocytes Relative: 24 %
Lymphs Abs: 1.5 K/uL (ref 0.7–4.0)
MCH: 33 pg (ref 26.0–34.0)
MCHC: 35.5 g/dL (ref 30.0–36.0)
MCV: 93.2 fL (ref 80.0–100.0)
Monocytes Absolute: 0.8 K/uL (ref 0.1–1.0)
Monocytes Relative: 12 %
Neutro Abs: 4 K/uL (ref 1.7–7.7)
Neutrophils Relative %: 63 %
Platelet Count: 181 K/uL (ref 150–400)
RBC: 3.51 MIL/uL — ABNORMAL LOW (ref 4.22–5.81)
RDW: 12.2 % (ref 11.5–15.5)
WBC Count: 6.4 K/uL (ref 4.0–10.5)
nRBC: 0 % (ref 0.0–0.2)

## 2024-04-19 LAB — CMP (CANCER CENTER ONLY)
ALT: 27 U/L (ref 0–44)
AST: 29 U/L (ref 15–41)
Albumin: 4.3 g/dL (ref 3.5–5.0)
Alkaline Phosphatase: 81 U/L (ref 38–126)
Anion gap: 11 (ref 5–15)
BUN: 11 mg/dL (ref 8–23)
CO2: 24 mmol/L (ref 22–32)
Calcium: 9.4 mg/dL (ref 8.9–10.3)
Chloride: 102 mmol/L (ref 98–111)
Creatinine: 0.95 mg/dL (ref 0.61–1.24)
GFR, Estimated: >60 mL/min (ref >60)
Glucose, Bld: 182 mg/dL — ABNORMAL HIGH (ref 70–99)
Potassium: 3.4 mmol/L — ABNORMAL LOW (ref 3.5–5.1)
Sodium: 137 mmol/L (ref 135–145)
Total Bilirubin: 0.5 mg/dL (ref 0.0–1.2)
Total Protein: 6.7 g/dL (ref 6.5–8.1)

## 2024-04-19 LAB — TOTAL PROTEIN, URINE DIPSTICK: Protein, ur: NEGATIVE mg/dL

## 2024-04-19 LAB — CEA (ACCESS): CEA (CHCC): 40.89 ng/mL — ABNORMAL HIGH (ref 0.00–5.00)

## 2024-04-19 MED ORDER — ATROPINE SULFATE 1 MG/ML IV SOLN
0.5000 mg | Freq: Once | INTRAVENOUS | Status: DC | PRN
  Filled 2024-04-19: qty 1

## 2024-04-19 MED ORDER — SODIUM CHLORIDE 0.9 % IV SOLN
180.0000 mg/m2 | Freq: Once | INTRAVENOUS | Status: AC
  Administered 2024-04-19: 13:00:00 340 mg via INTRAVENOUS
  Filled 2024-04-19: qty 15

## 2024-04-19 MED ORDER — PALONOSETRON HCL INJECTION 0.25 MG/5ML
0.2500 mg | Freq: Once | INTRAVENOUS | Status: AC
  Administered 2024-04-19: 12:00:00 0.25 mg via INTRAVENOUS
  Filled 2024-04-19: qty 5

## 2024-04-19 MED ORDER — DEXAMETHASONE SOD PHOSPHATE PF 10 MG/ML IJ SOLN
10.0000 mg | Freq: Once | INTRAMUSCULAR | Status: AC
  Administered 2024-04-19: 12:00:00 10 mg via INTRAVENOUS

## 2024-04-19 MED ORDER — SODIUM CHLORIDE 0.9 % IV SOLN: INTRAVENOUS | Status: DC

## 2024-04-19 MED ORDER — FLUOROURACIL CHEMO INJECTION 2.5 GM/50ML
400.0000 mg/m2 | Freq: Once | INTRAVENOUS | Status: AC
  Administered 2024-04-19: 15:00:00 750 mg via INTRAVENOUS
  Filled 2024-04-19: qty 15

## 2024-04-19 MED ORDER — SODIUM CHLORIDE 0.9 % IV SOLN
400.0000 mg/m2 | Freq: Once | INTRAVENOUS | Status: AC
  Administered 2024-04-19: 13:00:00 772 mg via INTRAVENOUS
  Filled 2024-04-19: qty 38.6

## 2024-04-19 MED ORDER — SODIUM CHLORIDE 0.9 % IV SOLN
5.0000 mg/kg | Freq: Once | INTRAVENOUS | Status: AC
  Administered 2024-04-19: 13:00:00 400 mg via INTRAVENOUS
  Filled 2024-04-19: qty 16

## 2024-04-19 MED ORDER — SODIUM CHLORIDE 0.9 % IV SOLN
2400.0000 mg/m2 | INTRAVENOUS | Status: DC
  Administered 2024-04-19: 15:00:00 5000 mg via INTRAVENOUS
  Filled 2024-04-19: qty 100

## 2024-04-19 NOTE — Progress Notes (Signed)
 [facility]   Telephone:(336) [phone] Fax:(336) [phone]    Patient Care Team: Virgia Dorothe BRAVO, MD as PCP - General (Internal Medicine) Ladona Heinz, MD as PCP - Cardiology (Cardiology) Ladona Heinz, MD as Consulting Physician (Cardiology)   CHIEF COMPLAINT: Follow up rectal cancer   CURRENT THERAPY: FOLFIRI/Beva  INTERVAL HISTORY Ronald [name] returns for follow up. Last seen 12/2 with cycle 1 but he left clinic with port accessed prior to receiving treatment. Multiple attempts were made to bring him in for de-access. Chemo rescheduled to 12/8 but he did not come in. Presents with his wife, feels well overall no changes. Neuropathy is mild-moderate and intermittent. Functions without much difficulty. Appetite/energy are adequate. Bowels moving. Denies n/v, pain, bleeding, fever/chills, cough, chest pain, dyspnea, leg edema or other specific concerns.   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   a.) Bx 07/24/2022 --> pathology (+) for invasive 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; stage IIIc (T4N1M0); MSS stable, KRAS (+); Tx'd with neoadjuvant chemoradiation +  surgical resection   Alcohol use    Aortic atherosclerosis    Arthritis    Bilateral carotid artery disease    Cholelithiasis    Chronic post-traumatic stress disorder (PTSD) after military combat    Cigar smoker    COPD (chronic obstructive pulmonary disease) (HCC)    Coronary artery disease 01/15/2014   a.) s/p NSTEMI 11/19/2021 --> IRA 99% RPDA not amenable to PCI; b.) s/p NSTEMI 12/23/2021 --> LHC/PCI: PTCA D1 bifurcation/RI + PTCA/PCI D1 (2.25 x 12 mm Onyx DES)   Diverticulosis    Enlarged prostate    History of kidney stones    History of serving in [employer] ([place] veteran)    Hyperlipidemia    Hypertension    Long-term use of aspirin  therapy    NSTEMI (non-ST elevated myocardial  infarction) (HCC) 11/19/2021   a.) LHC 11/19/2021: 20% mLM, 40% pLAD, 80% lateral D1, 90% D1, and 80% RPDA --> Rx mgmt   NSTEMI (non-ST elevated myocardial infarction) (HCC) 01/14/2014   a.) LHC 01/15/2014: 30-40% pLM, 30-40% mLAD, 80% D1, 40-50% p-mRCA, 99% RPDA  --> IRA was the RDPA, however it was small and not amenable to PCI --> Rx mgmt   Pulmonary nodules    PVD (peripheral vascular disease)    Unstable angina [facility])      Past Surgical History:  Procedure Laterality Date   BIOPSY  07/24/2022   Procedure: BIOPSY;  Surgeon: Rollin Dover, MD;  Location: [facility];  Service: Gastroenterology;;   BIOPSY  05/14/2023   Procedure: BIOPSY;  Surgeon: Rollin Dover, MD;  Location: [facility];  Service: Gastroenterology;;   BIOPSY OF SKIN SUBCUTANEOUS TISSUE AND/OR MUCOUS MEMBRANE  09/17/2023   Procedure: BIOPSY, SKIN, SUBCUTANEOUS TISSUE, OR MUCOUS MEMBRANE;  Surgeon: Rollin Dover, MD;  Location: [facility];  Service: Gastroenterology;;   COLONOSCOPY WITH PROPOFOL  N/A 07/24/2022   Procedure: COLONOSCOPY WITH PROPOFOL ;  Surgeon: Rollin Dover, MD;  Location: [facility];  Service: Gastroenterology;  Laterality: N/A;   CORONARY STENT INTERVENTION N/A 12/23/2021   Procedure: CORONARY STENT INTERVENTION;  Surgeon: Ladona Heinz, MD;  Location: [facility];  Service: Cardiovascular;  Laterality: N/A;   FLEXIBLE SIGMOIDOSCOPY N/A 05/14/2023   Procedure: FLEXIBLE SIGMOIDOSCOPY;  Surgeon: Rollin Dover, MD;  Location: [facility];  Service: Gastroenterology;  Laterality: N/A;   FLEXIBLE SIGMOIDOSCOPY N/A 09/17/2023   Procedure: SIGMOIDOSCOPY,  FLEXIBLE;  Surgeon: Rollin Dover, MD;  Location: [facility];  Service: Gastroenterology;  Laterality: N/A;   LEFT HEART CATH AND CORONARY ANGIOGRAPHY N/A 11/19/2021   Procedure: LEFT HEART CATH AND CORONARY ANGIOGRAPHY;  Surgeon: Elmira Newman PARAS, MD;  Location: [facility];  Service: Cardiovascular;  Laterality: N/A;   LEFT HEART  CATHETERIZATION WITH CORONARY ANGIOGRAM N/A 01/15/2014   Procedure: LEFT HEART CATHETERIZATION WITH CORONARY ANGIOGRAM;  Surgeon: Ozell JONETTA Fell, MD;  Location: [facility];  Service: Cardiovascular;  Laterality: N/A;   PORTACATH PLACEMENT N/A 08/18/2022   Procedure: INSERTION PORT-A-CATH;  Surgeon: Teresa Lonni HERO, MD;  Location: [facility];  Service: General;  Laterality: N/A;  60   VIDEO BRONCHOSCOPY WITH ENDOBRONCHIAL NAVIGATION Bilateral 01/25/2024   Procedure: VIDEO BRONCHOSCOPY WITH ENDOBRONCHIAL NAVIGATION;  Surgeon: Isadora Hose, MD;  Location: [facility];  Service: Pulmonary;  Laterality: Bilateral;     Outpatient Encounter Medications as of 04/19/2024  Medication Sig Note   amLODipine  (NORVASC ) 5 MG tablet Take 1 tablet (5 mg total) by mouth daily at 10 pm.    aspirin  81 MG EC tablet Take 1 tablet (81 mg total) by mouth daily.    atorvastatin  (LIPITOR ) 80 MG tablet Take 1 tablet (80 mg total) by mouth at bedtime.    calcium -vitamin D (OSCAL WITH D) 500-5 MG-MCG tablet Take 1 tablet by mouth 2 (two) times daily.    cloNIDine  (CATAPRES ) 0.3 MG tablet Take 1 tablet (0.3 mg total) by mouth 2 (two) times daily.    diphenhydramine-acetaminophen  (TYLENOL  PM) 25-500 MG TABS tablet Take 2 tablets by mouth at bedtime.    Docusate Sodium (COLACE PO) Take 2 capsules by mouth as needed.    DULoxetine (CYMBALTA) 30 MG capsule Take 30 mg by mouth daily.    ezetimibe  (ZETIA ) 10 MG tablet Take 1 tablet (10 mg total) by mouth daily.    isosorbide  mononitrate (IMDUR ) 30 MG 24 hr tablet Take 1 tablet (30 mg total) by mouth [name] morning.    lidocaine -prilocaine  (EMLA ) cream Apply 1 Application topically as needed (Use as directed to apply to port area 1-2 hours prior to appt).    MAGNESIUM  PO Take 1 tablet by mouth at bedtime.    metoprolol  succinate (TOPROL -XL) 25 MG 24 hr tablet Take 1 tablet (25 mg total) by mouth daily.    Omega-3 Fatty Acids (FISH OIL PO) Take 1 capsule by mouth daily.     omeprazole  (PRILOSEC  OTC) 20 MG tablet Take 1 tablet (20 mg total) by mouth daily.    ondansetron  (ZOFRAN ) 8 MG tablet Take 1 tablet (8 mg total) by mouth [name] 8 (eight) hours as needed for nausea or vomiting (can begin 72 hours after day 1 chemo).    OVER THE COUNTER MEDICATION Take 5 capsules by mouth daily at 6 (six) AM. 11/29/2023: Imuno 150   potassium chloride  SA (KLOR-CON  M) 20 MEQ tablet Take 1 tablet (20 mEq total) by mouth 2 (two) times daily. 03/26/2023: Takes 20 meq daily   predniSONE (DELTASONE) 20 MG tablet Take 20 mg by mouth as needed (it is his friends prescription). (Patient taking differently: Take 20 mg by mouth as needed.)    prochlorperazine  (COMPAZINE ) 10 MG tablet Take 1 tablet (10 mg total) by mouth [name] 6 (six) hours as needed for nausea or vomiting.    tamsulosin  (FLOMAX ) 0.4 MG CAPS capsule Take 0.4 mg by mouth 2 (two) times daily.    TURMERIC PO Take 2 tablets by mouth daily.  loperamide  (IMODIUM  A-D) 2 MG tablet Take 1-2 tablets (2-4 mg total) by mouth 4 (four) times daily as needed for diarrhea or loose stools (Up to 8tabs/day). (Patient not taking: Reported on 04/19/2024)    nitroGLYCERIN  (NITROSTAT ) 0.4 MG SL tablet Place 1 tablet (0.4 mg total) under the tongue [name] 5 (five) minutes x 3 doses as needed for chest pain. (Patient not taking: Reported on 04/19/2024) 09/17/2023: 09/17/2023:    Per patient wife more than 3 months ago   polyethylene glycol (MIRALAX / GLYCOLAX) 17 g packet Take 17 g by mouth 2 (two) times daily. (Patient not taking: Reported on 04/19/2024)    Probiotic Product (RESTORA) CAPS Take 1 capsule by mouth daily. (Patient not taking: Reported on 04/19/2024) 10/05/2022: On hold   Facility-Administered Encounter Medications as of 04/19/2024  Medication   sodium chloride  flush (NS) 0.9 % injection 10 mL     Today's Vitals   04/19/24 1000 04/19/24 1040  BP:  121/64  Pulse:  76  Resp:  16  Temp:  98.4 F (36.9 C)  TempSrc:  Temporal  SpO2:  96%   Weight:  174 lb 8 oz (79.2 kg)  PainSc: 5     Body mass index is 26.53 kg/m.   ECOG PERFORMANCE STATUS: 1 - Symptomatic but completely ambulatory  PHYSICAL EXAM GENERAL:alert, no distress and comfortable SKIN: no rash  EYES: sclera clear NECK: without mass LYMPH:  no palpable cervical or supraclavicular lymphadenopathy  LUNGS: clear with normal breathing effort HEART: regular rate & rhythm, no lower extremity edema ABDOMEN: abdomen soft, non-tender and normal bowel sounds NEURO: alert & oriented x 3 with fluent speech, no focal motor deficits PAC without erythema    CBC    Latest Ref Rng & Units 04/19/2024   10:09 AM 04/04/2024    9:43 AM 03/06/2024    8:45 AM  CBC  WBC 4.0 - 10.5 K/uL 6.4  5.4  5.6   Hemoglobin 13.0 - 17.0 g/dL 88.3  87.5  87.9   Hematocrit 39.0 - 52.0 % 32.7  35.5  34.2   Platelets 150 - 400 K/uL 181  177  170       CMP     Latest Ref Rng & Units 04/19/2024   10:09 AM 04/04/2024    9:43 AM 03/06/2024    8:45 AM  CMP  Glucose 70 - 99 mg/dL 817  833  855   BUN 8 - 23 mg/dL 11  13  13    Creatinine 0.61 - 1.24 mg/dL 9.04  9.10  9.12   Sodium 135 - 145 mmol/L 137  138  135   Potassium 3.5 - 5.1 mmol/L 3.4  3.7  4.1   Chloride 98 - 111 mmol/L 102  104  103   CO2 22 - 32 mmol/L 24  23  24    Calcium  8.9 - 10.3 mg/dL 9.4  9.3  9.2   Total Protein 6.5 - 8.1 g/dL 6.7  6.7  6.4   Total Bilirubin 0.0 - 1.2 mg/dL 0.5  0.4  0.3   Alkaline Phos 38 - 126 U/L 81  77  74   AST 15 - 41 U/L 29  28  27    ALT 0 - 44 U/L 27  30  30        ASSESSMENT & PLAN: Rectal cancer Colonoscopy 07/24/2022-mass in the posterior rectum extending from the dentate line for 3 cm, biopsy-moderately differentiated invasive adenocarcinoma, mismatch repair protein expression intact, microsatellite stable, Foundation 1-tumor mutation  burden 14, KRAS G 12D, HRD signature negative CT abdomen/pelvis 04/07/2022-cardiac enlargement, hepatic steatosis mild left adrenal nodularity stable  compared to chest CT March 2020, small nonobstructing bilateral renal calculi, extensive colonic diverticula CT chest 06/03/2022-6 mm nodule in the superior right lower lobe-new from 07/12/2018, new 1-2 mm nodule within the left upper lobe Elevated CEA 07/24/2022 MRI pelvis 08/06/2022-tumor at 3.5 cm from the anal verge with involvement of the upper anal sphincter, invasion of the inferior right levator ani muscle, T4b, one 5 mm left posterior perirectal node-N 1 CTs 08/19/2022-partially circumferential mass of the low rectum.  Small bilateral pulmonary nodules slightly enlarged compared to prior examination.  Plan for follow-up CT chest after completing 6 cycles of chemotherapy. Cycle 1 FOLFOX 08/24/2022 Cycle 2 FOLFOX 09/07/2022 Chemotherapy held 09/21/2022 due to severe neutropenia, thrombocytopenia Cycle 3 FOLFOX 10/05/2022, Udenyca , oxaliplatin  dose reduced secondary to thrombocytopenia following cycle 2 Treatment held 10/20/2022 due to thrombocytopenia Cycle 4 FOLFOX 10/27/2022, Udenyca , oxaliplatin  dose reduced secondary to thrombocytopenia following cycle 3 Cycle 5 FOLFOX 11/09/2022, Udenyca  Cycle 6 FOLFOX 11/24/2022, Udenyca  Cycle 7 FOLFOX 12/08/2022, Udenyca  CTs 12/16/2022-similar appearance of circumferential low rectal wall mass; decreased size of scattered small bilateral pulmonary nodules.  No new suspicious pulmonary nodules or masses.  No evidence of metastatic disease in the abdomen or pelvis. Cycle 8 FOLFOX 01/05/2023, 5-FU bolus eliminated, pump dose decreased due to diarrhea Radiation/Xeloda 02/08/2023-03/17/2023 CT chest/abdomen 04/17/2023: Stable pulmonary nodules no evidence of metastatic disease MRI pelvis 04/16/2023: Tumor at 3.5 cm from anal verge, decreased in thickness, decreased extension through the muscularis propria with 1 area of extension measuring 3 mm-potentially retraction scar, abutment of the mesorectal fascia at the 8 o'clock position, previously noted perirectal lymph node is no  longer measurable, no new lymphadenopathy Flexible sigmoidoscopy 05/14/2023-small scar found in the rectum.  Scar tissue was healthy in appearance.  No evidence of residual cancer.  Biopsies obtained-colonic mucosa with hyperplastic and reactive changes.  Negative for dysplasia or malignancy. MRI pelvis 08/17/2023: Further reduction in thickness at the low rectum/anal canal, no lymphadenopathy 09/17/2023 flexible sigmoidoscopy-scar in the distal rectum, biopsy shows colonic mucosa with hyperplastic changes, negative for dysplasia 10/18/2023 CEA 11.45 CTs 10/18/2023-extensive multivessel coronary artery calcification.  No enlarged mediastinal or axillary lymph nodes.  Multiple randomly distributed nodules have developed within the lungs bilaterally.  Interval enlargement of the dominant nodule within the superior segment right lower lobe now measuring 11 mm PET 11/15/2023: Multiple lung nodules with increased metabolic activity, no other evidence of metastatic disease 01/18/2024 CT chest: Numerous progressive bilateral pulmonary nodules 01/25/2024 bronchoscopy with biopsy of right lower lobe nodule: Forcep biopsy-metastatic moderately differentiated adenocarcinoma consistent with colonic primary, CDX2 and CK20 positive.  Cytology-adenocarcinoma, consistent with metastatic colonic adenocarcinoma-CDX2 and CK20 positive CT chest 03/06/2024-multiple solid bilateral pulmonary nodules again visualized, grossly stable.  New superior right lower lobe 15 mm pulmonary nodule. Cycle 1 FOLFIRI/Beva 04/19/2024, CEA 40.89   CAD, status post myocardial infarction 2015 and July 2023, D1 branch LAD angioplasty 12/23/2021 Hyperlipidemia Hypertension PTSD BPH Cigar use Pelvic pain: MRI left hip 01/24/2024: Insufficiency fracture at the left sacral alla, hyperintensity in the central sacrum and right pubic symphysis consistent with stress   Disposition:  Ronald [name] appears unchanged. Neuropathy is intermittent. PS is  adequate. Labs reviewed.   Proceed with cycle 1 FOLFIRI/Beva today, no dose adjustments. We reviewed potential side effects and symptom management.   Follow up and cycle 2 in 2 weeks, or sooner if needed.  All questions were answered. The patient knows to call the clinic with any problems, questions or concerns. No barriers to learning were detected.   Angelino Rumery K Tashina Credit, NP 04/19/2024

## 2024-04-19 NOTE — Patient Instructions (Signed)

## 2024-04-19 NOTE — Patient Instructions (Addendum)
 [facility] - A DEPT OF [facility]  Discharge Instructions: Thank you for choosing [facility] to provide your oncology and hematology care.   If you have a lab appointment with the Cancer Center, please go directly to the Cancer Center and check in at the registration area.   Wear comfortable clothing and clothing appropriate for easy access to any Portacath or PICC line.   We strive to give you quality time with your provider. You may need to reschedule your appointment if you arrive late (15 or more minutes).  Arriving late affects you and other patients whose appointments are after yours.  Also, if you miss three or more appointments without notifying the office, you may be dismissed from the clinic at the providers discretion.      For prescription refill requests, have your pharmacy contact our office and allow 72 hours for refills to be completed.    Today you received the following chemotherapy and/or immunotherapy agents: bevacizumab , irinotecan , leucovorin , fluorouracil      Bevacizumab  Injection What is this medication? BEVACIZUMAB  (be va SIZ yoo mab) treats some types of cancer. It works by blocking a protein that causes cancer cells to grow and multiply. This helps to slow or stop the spread of cancer cells. It is a monoclonal antibody. This medicine may be used for other purposes; ask your health care provider or pharmacist if you have questions. COMMON BRAND NAME(S): Alymsys , Avastin , MVASI , Vegzalma, Zirabev  What should I tell my care team before I take this medication? They need to know if you have any of these conditions: Blood clots Coughing up blood Having or recent surgery Heart failure High blood pressure History of a connection between 2 or more body parts that do not usually connect (fistula) History of a tear in your stomach or intestines Protein in your urine An unusual or allergic reaction to bevacizumab , other  medications, foods, dyes, or preservatives Pregnant or trying to get pregnant Breast-feeding How should I use this medication? This medication is injected into a vein. It is given by your care team in a hospital or clinic setting. Talk to your care team the use of this medication in children. Special care may be needed. Overdosage: If you think you have taken too much of this medicine contact a poison control center or emergency room at once. NOTE: This medicine is only for you. Do not share this medicine with others. What if I miss a dose? Keep appointments for follow-up doses. It is important not to miss your dose. Call your care team if you are unable to keep an appointment. What may interact with this medication? Interactions are not expected. This list may not describe all possible interactions. Give your health care provider a list of all the medicines, herbs, non-prescription drugs, or dietary supplements you use. Also tell them if you smoke, drink alcohol, or use illegal drugs. Some items may interact with your medicine. What should I watch for while using this medication? Your condition will be monitored carefully while you are receiving this medication. You may need blood work while taking this medication. This medication may make you feel generally unwell. This is not uncommon as chemotherapy can affect healthy cells as well as cancer cells. Report any side effects. Continue your course of treatment even though you feel ill unless your care team tells you to stop. This medication may increase your risk to bruise or bleed. Call your care team if you  notice any unusual bleeding. Before having surgery, talk to your care team to make sure it is ok. This medication can increase the risk of poor healing of your surgical site or wound. You will need to stop this medication for 28 days before surgery. After surgery, wait at least 28 days before restarting this medication. Make sure the surgical  site or wound is healed enough before restarting this medication. Talk to your care team if questions. Talk to your care team if you may be pregnant. Serious birth defects can occur if you take this medication during pregnancy and for 6 months after the last dose. Contraception is recommended while taking this medication and for 6 months after the last dose. Your care team can help you find the option that works for you. Do not breastfeed while taking this medication and for 6 months after the last dose. This medication can cause infertility. Talk to your care team if you are concerned about your fertility. What side effects may I notice from receiving this medication? Side effects that you should report to your care team as soon as possible: Allergic reactions--skin rash, itching, hives, swelling of the face, lips, tongue, or throat Bleeding--bloody or black, tar-like stools, vomiting blood or brown material that looks like coffee grounds, red or dark brown urine, small red or purple spots on skin, unusual bruising or bleeding Blood clot--pain, swelling, or warmth in the leg, shortness of breath, chest pain Heart attack--pain or tightness in the chest, shoulders, arms, or jaw, nausea, shortness of breath, cold or clammy skin, feeling faint or lightheaded Heart failure--shortness of breath, swelling of the ankles, feet, or hands, sudden weight gain, unusual weakness or fatigue Increase in blood pressure Infection--fever, chills, cough, sore throat, wounds that don't heal, pain or trouble when passing urine, general feeling of discomfort or being unwell Infusion reactions--chest pain, shortness of breath or trouble breathing, feeling faint or lightheaded Kidney injury--decrease in the amount of urine, swelling of the ankles, hands, or feet Stomach pain that is severe, does not go away, or gets worse Stroke--sudden numbness or weakness of the face, arm, or leg, trouble speaking, confusion, trouble  walking, loss of balance or coordination, dizziness, severe headache, change in vision Sudden and severe headache, confusion, change in vision, seizures, which may be signs of posterior reversible encephalopathy syndrome (PRES) Side effects that usually do not require medical attention (report to your care team if they continue or are bothersome): Back pain Change in taste Diarrhea Dry skin Increased tears Nosebleed This list may not describe all possible side effects. Call your doctor for medical advice about side effects. You may report side effects to FDA at 1-800-FDA-1088. Where should I keep my medication? This medication is given in a hospital or clinic. It will not be stored at home. NOTE: This sheet is a summary. It may not cover all possible information. If you have questions about this medicine, talk to your doctor, pharmacist, or health care provider.  2024 Elsevier/Gold Standard (2021-09-05 00:00:00)  Irinotecan  Injection What is this medication? IRINOTECAN  (ir in oh TEE kan) treats some types of cancer. It works by slowing down the growth of cancer cells. This medicine may be used for other purposes; ask your health care provider or pharmacist if you have questions. COMMON BRAND NAME(S): Camptosar  What should I tell my care team before I take this medication? They need to know if you have any of these conditions: Dehydration Diarrhea Infection, especially a viral infection, such as chickenpox,  cold sores, herpes Liver disease Low blood cell levels (white cells, red cells, and platelets) Low levels of electrolytes, such as calcium , magnesium , or potassium in your blood Recent or ongoing radiation An unusual or allergic reaction to irinotecan , other medications, foods, dyes, or preservatives If you or your partner are pregnant or trying to get pregnant Breast-feeding How should I use this medication? This medication is injected into a vein. It is given by your care team  in a hospital or clinic setting. Talk to your care team about the use of this medication in children. Special care may be needed. Overdosage: If you think you have taken too much of this medicine contact a poison control center or emergency room at once. NOTE: This medicine is only for you. Do not share this medicine with others. What if I miss a dose? Keep appointments for follow-up doses. It is important not to miss your dose. Call your care team if you are unable to keep an appointment. What may interact with this medication? Do not take this medication with any of the following: Cobicistat Itraconazole This medication may also interact with the following: Certain antibiotics, such as clarithromycin, rifampin, rifabutin Certain antivirals for HIV or AIDS Certain medications for fungal infections, such as ketoconazole, posaconazole, voriconazole Certain medications for seizures, such as carbamazepine, phenobarbital, phenytoin Gemfibrozil Nefazodone St. John's wort This list may not describe all possible interactions. Give your health care provider a list of all the medicines, herbs, non-prescription drugs, or dietary supplements you use. Also tell them if you smoke, drink alcohol, or use illegal drugs. Some items may interact with your medicine. What should I watch for while using this medication? Your condition will be monitored carefully while you are receiving this medication. You may need blood work while taking this medication. This medication may make you feel generally unwell. This is not uncommon as chemotherapy can affect healthy cells as well as cancer cells. Report any side effects. Continue your course of treatment even though you feel ill unless your care team tells you to stop. This medication can cause serious side effects. To reduce the risk, your care team may give you other medications to take before receiving this one. Be sure to follow the directions from your care  team. This medication may affect your coordination, reaction time, or judgement. Do not drive or operate machinery until you know how this medication affects you. Sit up or stand slowly to reduce the risk of dizzy or fainting spells. Drinking alcohol with this medication can increase the risk of these side effects. This medication may increase your risk of getting an infection. Call your care team for advice if you get a fever, chills, sore throat, or other symptoms of a cold or flu. Do not treat yourself. Try to avoid being around people who are sick. Avoid taking medications that contain aspirin , acetaminophen , ibuprofen, naproxen, or ketoprofen unless instructed by your care team. These medications may hide a fever. This medication may increase your risk to bruise or bleed. Call your care team if you notice any unusual bleeding. Be careful brushing or flossing your teeth or using a toothpick because you may get an infection or bleed more easily. If you have any dental work done, tell your dentist you are receiving this medication. Talk to your care team if you or your partner are pregnant or think either of you might be pregnant. This medication can cause serious birth defects if taken during pregnancy and for 6 months  after the last dose. You will need a negative pregnancy test before starting this medication. Contraception is recommended while taking this medication and for 6 months after the last dose. Your care team can help you find the option that works for you. Do not father a child while taking this medication and for 3 months after the last dose. Use a condom for contraception during this time period. Do not breastfeed while taking this medication and for 7 days after the last dose. This medication may cause infertility. Talk to your care team if you are concerned about your fertility. What side effects may I notice from receiving this medication? Side effects that you should report to your  care team as soon as possible: Allergic reactions--skin rash, itching, hives, swelling of the face, lips, tongue, or throat Dry cough, shortness of breath or trouble breathing Increased saliva or tears, increased sweating, stomach cramping, diarrhea, small pupils, unusual weakness or fatigue, slow heartbeat Infection--fever, chills, cough, sore throat, wounds that don't heal, pain or trouble when passing urine, general feeling of discomfort or being unwell Kidney injury--decrease in the amount of urine, swelling of the ankles, hands, or feet Low red blood cell level--unusual weakness or fatigue, dizziness, headache, trouble breathing Severe or prolonged diarrhea Unusual bruising or bleeding Side effects that usually do not require medical attention (report to your care team if they continue or are bothersome): Constipation Diarrhea Hair loss Loss of appetite Nausea Stomach pain This list may not describe all possible side effects. Call your doctor for medical advice about side effects. You may report side effects to FDA at 1-800-FDA-1088. Where should I keep my medication? This medication is given in a hospital or clinic. It will not be stored at home. NOTE: This sheet is a summary. It may not cover all possible information. If you have questions about this medicine, talk to your doctor, pharmacist, or health care provider.  2024 Elsevier/Gold Standard (2021-09-01 00:00:00)  Leucovorin  Injection What is this medication? LEUCOVORIN  (loo koe VOR in) prevents side effects from certain medications, such as methotrexate. It works by increasing folate levels. This helps protect healthy cells in your body. It may also be used to treat anemia caused by low levels of folate. It can also be used with fluorouracil , a type of chemotherapy, to treat colorectal cancer. It works by increasing the effects of fluorouracil  in the body. This medicine may be used for other purposes; ask your health care  provider or pharmacist if you have questions. What should I tell my care team before I take this medication? They need to know if you have any of these conditions: Anemia from low levels of vitamin B12 in the blood An unusual or allergic reaction to leucovorin , folic acid, other medications, foods, dyes, or preservatives Pregnant or trying to get pregnant Breastfeeding How should I use this medication? This medication is injected into a vein or a muscle. It is given by your care team in a hospital or clinic setting. Talk to your care team about the use of this medication in children. Special care may be needed. Overdosage: If you think you have taken too much of this medicine contact a poison control center or emergency room at once. NOTE: This medicine is only for you. Do not share this medicine with others. What if I miss a dose? Keep appointments for follow-up doses. It is important not to miss your dose. Call your care team if you are unable to keep an appointment. What may  interact with this medication? Capecitabine Fluorouracil  Phenobarbital Phenytoin Primidone Trimethoprim;sulfamethoxazole This list may not describe all possible interactions. Give your health care provider a list of all the medicines, herbs, non-prescription drugs, or dietary supplements you use. Also tell them if you smoke, drink alcohol, or use illegal drugs. Some items may interact with your medicine. What should I watch for while using this medication? Your condition will be monitored carefully while you are receiving this medication. This medication may increase the side effects of 5-fluorouracil . Tell your care team if you have diarrhea or mouth sores that do not get better or that get worse. What side effects may I notice from receiving this medication? Side effects that you should report to your care team as soon as possible: Allergic reactions--skin rash, itching, hives, swelling of the face, lips, tongue,  or throat This list may not describe all possible side effects. Call your doctor for medical advice about side effects. You may report side effects to FDA at 1-800-FDA-1088. Where should I keep my medication? This medication is given in a hospital or clinic. It will not be stored at home. NOTE: This sheet is a summary. It may not cover all possible information. If you have questions about this medicine, talk to your doctor, pharmacist, or health care provider.  2024 Elsevier/Gold Standard (2021-09-23 00:00:00)  Fluorouracil  Injection What is this medication? FLUOROURACIL  (flure oh YOOR a sil) treats some types of cancer. It works by slowing down the growth of cancer cells. This medicine may be used for other purposes; ask your health care provider or pharmacist if you have questions. COMMON BRAND NAME(S): Adrucil  What should I tell my care team before I take this medication? They need to know if you have any of these conditions: Blood disorders Dihydropyrimidine dehydrogenase (DPD) deficiency Infection, such as chickenpox, cold sores, herpes Kidney disease Liver disease Poor nutrition Recent or ongoing radiation therapy An unusual or allergic reaction to fluorouracil , other medications, foods, dyes, or preservatives If you or your partner are pregnant or trying to get pregnant Breast-feeding How should I use this medication? This medication is injected into a vein. It is administered by your care team in a hospital or clinic setting. Talk to your care team about the use of this medication in children. Special care may be needed. Overdosage: If you think you have taken too much of this medicine contact a poison control center or emergency room at once. NOTE: This medicine is only for you. Do not share this medicine with others. What if I miss a dose? Keep appointments for follow-up doses. It is important not to miss your dose. Call your care team if you are unable to keep an  appointment. What may interact with this medication? Do not take this medication with any of the following: Live virus vaccines This medication may also interact with the following: Medications that treat or prevent blood clots, such as warfarin, enoxaparin, dalteparin This list may not describe all possible interactions. Give your health care provider a list of all the medicines, herbs, non-prescription drugs, or dietary supplements you use. Also tell them if you smoke, drink alcohol, or use illegal drugs. Some items may interact with your medicine. What should I watch for while using this medication? Your condition will be monitored carefully while you are receiving this medication. This medication may make you feel generally unwell. This is not uncommon as chemotherapy can affect healthy cells as well as cancer cells. Report any side effects. Continue your course  of treatment even though you feel ill unless your care team tells you to stop. In some cases, you may be given additional medications to help with side effects. Follow all directions for their use. This medication may increase your risk of getting an infection. Call your care team for advice if you get a fever, chills, sore throat, or other symptoms of a cold or flu. Do not treat yourself. Try to avoid being around people who are sick. This medication may increase your risk to bruise or bleed. Call your care team if you notice any unusual bleeding. Be careful brushing or flossing your teeth or using a toothpick because you may get an infection or bleed more easily. If you have any dental work done, tell your dentist you are receiving this medication. Avoid taking medications that contain aspirin , acetaminophen , ibuprofen, naproxen, or ketoprofen unless instructed by your care team. These medications may hide a fever. Do not treat diarrhea with over the counter products. Contact your care team if you have diarrhea that lasts more than 2  days or if it is severe and watery. This medication can make you more sensitive to the sun. Keep out of the sun. If you cannot avoid being in the sun, wear protective clothing and sunscreen. Do not use sun lamps, tanning beds, or tanning booths. Talk to your care team if you or your partner wish to become pregnant or think you might be pregnant. This medication can cause serious birth defects if taken during pregnancy and for 3 months after the last dose. A reliable form of contraception is recommended while taking this medication and for 3 months after the last dose. Talk to your care team about effective forms of contraception. Do not father a child while taking this medication and for 3 months after the last dose. Use a condom while having sex during this time period. Do not breastfeed while taking this medication. This medication may cause infertility. Talk to your care team if you are concerned about your fertility. What side effects may I notice from receiving this medication? Side effects that you should report to your care team as soon as possible: Allergic reactions--skin rash, itching, hives, swelling of the face, lips, tongue, or throat Heart attack--pain or tightness in the chest, shoulders, arms, or jaw, nausea, shortness of breath, cold or clammy skin, feeling faint or lightheaded Heart failure--shortness of breath, swelling of the ankles, feet, or hands, sudden weight gain, unusual weakness or fatigue Heart rhythm changes--fast or irregular heartbeat, dizziness, feeling faint or lightheaded, chest pain, trouble breathing High ammonia level--unusual weakness or fatigue, confusion, loss of appetite, nausea, vomiting, seizures Infection--fever, chills, cough, sore throat, wounds that don't heal, pain or trouble when passing urine, general feeling of discomfort or being unwell Low red blood cell level--unusual weakness or fatigue, dizziness, headache, trouble breathing Pain, tingling, or  numbness in the hands or feet, muscle weakness, change in vision, confusion or trouble speaking, loss of balance or coordination, trouble walking, seizures Redness, swelling, and blistering of the skin over hands and feet Severe or prolonged diarrhea Unusual bruising or bleeding Side effects that usually do not require medical attention (report to your care team if they continue or are bothersome): Dry skin Headache Increased tears Nausea Pain, redness, or swelling with sores inside the mouth or throat Sensitivity to light Vomiting This list may not describe all possible side effects. Call your doctor for medical advice about side effects. You may report side effects to FDA at  1-800-FDA-1088. Where should I keep my medication? This medication is given in a hospital or clinic. It will not be stored at home. NOTE: This sheet is a summary. It may not cover all possible information. If you have questions about this medicine, talk to your doctor, pharmacist, or health care provider.  2024 Elsevier/Gold Standard (2021-08-26 00:00:00)    To help prevent nausea and vomiting after your treatment, we encourage you to take your nausea medication as directed.  BELOW ARE SYMPTOMS THAT SHOULD BE REPORTED IMMEDIATELY: *FEVER GREATER THAN 100.4 F (38 C) OR HIGHER *CHILLS OR SWEATING *NAUSEA AND VOMITING THAT IS NOT CONTROLLED WITH YOUR NAUSEA MEDICATION *UNUSUAL SHORTNESS OF BREATH *UNUSUAL BRUISING OR BLEEDING *URINARY PROBLEMS (pain or burning when urinating, or frequent urination) *BOWEL PROBLEMS (unusual diarrhea, constipation, pain near the anus) TENDERNESS IN MOUTH AND THROAT WITH OR WITHOUT PRESENCE OF ULCERS (sore throat, sores in mouth, or a toothache) UNUSUAL RASH, SWELLING OR PAIN  UNUSUAL VAGINAL DISCHARGE OR ITCHING   Items with * indicate a potential emergency and should be followed up as soon as possible or go to the Emergency Department if any problems should occur.  Please show  the CHEMOTHERAPY ALERT CARD or IMMUNOTHERAPY ALERT CARD at check-in to the Emergency Department and triage nurse.  Should you have questions after your visit or need to cancel or reschedule your appointment, please contact [facility] - A DEPT OF [facility]  Dept: [phone]  and follow the prompts.  Office hours are 8:00 a.m. to 4:30 p.m. Monday - Friday. Please note that voicemails left after 4:00 p.m. may not be returned until the following business day.  We are closed weekends and major holidays. You have access to a nurse at all times for urgent questions. Please call the main number to the clinic Dept: [phone] and follow the prompts.   For any non-urgent questions, you may also contact your provider using MyChart. We now offer e-Visits for anyone 74 and older to request care online for non-urgent symptoms. For details visit [email or web address].   Also download the MyChart app! Go to the app store, search MyChart, open the app, select [facility], and log in with your MyChart username and password.

## 2024-04-19 NOTE — Progress Notes (Signed)
 Patient seen by Lacie Burton, NP today  Vitals are within treatment parameters:Yes   Labs are within treatment parameters: Yes   Treatment plan has been signed: Yes   Per physician team, Patient is ready for treatment and there are NO modifications to the treatment plan.

## 2024-04-19 NOTE — Progress Notes (Signed)
 Patient seen by Lacie Burton, NP today  Vitals are within treatment parameters:Yes   Labs are within treatment parameters: Yes he need to urine   Treatment plan has been signed: Yes   Per physician team, Patient is ready for treatment and there are NO modifications to the treatment plan.

## 2024-04-20 ENCOUNTER — Telehealth: Payer: Self-pay | Admitting: *Deleted

## 2024-04-20 ENCOUNTER — Encounter: Payer: Self-pay | Admitting: Oncology

## 2024-04-20 NOTE — Telephone Encounter (Signed)
 Mrs. 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 Requested nurse call patient. Notified Mr. Vangorder he still needs to hold the MVI and protein drinks are OK

## 2024-04-21 ENCOUNTER — Inpatient Hospital Stay

## 2024-04-21 ENCOUNTER — Other Ambulatory Visit: Payer: Self-pay

## 2024-04-21 ENCOUNTER — Inpatient Hospital Stay: Admitting: Nurse Practitioner

## 2024-04-21 VITALS — BP 128/73 | HR 80 | Temp 97.7°F | Resp 18

## 2024-04-21 DIAGNOSIS — Z5111 Encounter for antineoplastic chemotherapy: Secondary | ICD-10-CM | POA: Diagnosis not present

## 2024-04-21 DIAGNOSIS — C20 Malignant neoplasm of rectum: Secondary | ICD-10-CM

## 2024-04-21 NOTE — Patient Instructions (Signed)

## 2024-04-24 ENCOUNTER — Other Ambulatory Visit (HOSPITAL_COMMUNITY): Payer: Self-pay

## 2024-04-24 ENCOUNTER — Encounter: Payer: Self-pay | Admitting: Oncology

## 2024-04-24 ENCOUNTER — Telehealth: Payer: Self-pay

## 2024-04-24 MED ORDER — NYSTATIN 100000 UNIT/ML MT SUSP
5.0000 mL | Freq: Four times a day (QID) | OROMUCOSAL | 2 refills | Status: AC | PRN
  Filled 2024-04-24: qty 240, 12d supply, fill #0

## 2024-04-24 NOTE — Telephone Encounter (Signed)
 The patient's spouse called to report that the patient has a sore in his mouth. Magic mouth wash  (MMW) was called in to [facility].

## 2024-04-26 ENCOUNTER — Other Ambulatory Visit: Payer: Self-pay

## 2024-05-02 ENCOUNTER — Telehealth: Payer: Self-pay | Admitting: Nurse Practitioner

## 2024-05-02 MED ORDER — FLUOROURACIL CHEMO INJECTION 2.5 GM/50ML
400.0000 mg/m2 | Freq: Once | INTRAVENOUS | Status: AC
  Filled 2024-05-02: qty 15

## 2024-05-02 MED ORDER — SODIUM CHLORIDE 0.9 % IV SOLN
2400.0000 mg/m2 | INTRAVENOUS | Status: AC
  Filled 2024-05-02: qty 100

## 2024-05-02 NOTE — Telephone Encounter (Signed)
 Returning PT's call to cancel appt, wife not feeling well and PT wants to come in on next appt date 05/17/24.

## 2024-05-03 ENCOUNTER — Inpatient Hospital Stay

## 2024-05-03 ENCOUNTER — Inpatient Hospital Stay: Admitting: Nurse Practitioner

## 2024-05-05 ENCOUNTER — Inpatient Hospital Stay

## 2024-05-08 ENCOUNTER — Telehealth: Payer: Self-pay

## 2024-05-08 NOTE — Telephone Encounter (Signed)
"  error  "

## 2024-05-09 ENCOUNTER — Encounter: Payer: Self-pay | Admitting: *Deleted

## 2024-05-09 NOTE — Progress Notes (Signed)
 Faxed signed [employer] order, RFS form, last office note to [facility] [place] [phone] as requested.

## 2024-05-11 ENCOUNTER — Encounter: Payer: Self-pay | Admitting: Oncology

## 2024-05-12 ENCOUNTER — Telehealth: Payer: Self-pay | Admitting: Nurse Practitioner

## 2024-05-13 ENCOUNTER — Other Ambulatory Visit: Payer: Self-pay | Admitting: Oncology

## 2024-05-17 ENCOUNTER — Inpatient Hospital Stay: Attending: Nurse Practitioner

## 2024-05-17 ENCOUNTER — Telehealth: Payer: Self-pay | Admitting: Nurse Practitioner

## 2024-05-17 ENCOUNTER — Inpatient Hospital Stay: Admitting: Nurse Practitioner

## 2024-05-17 ENCOUNTER — Inpatient Hospital Stay

## 2024-05-19 ENCOUNTER — Inpatient Hospital Stay

## 2024-05-23 ENCOUNTER — Other Ambulatory Visit: Payer: Self-pay

## 2024-05-27 ENCOUNTER — Other Ambulatory Visit: Payer: Self-pay

## 2024-05-30 ENCOUNTER — Inpatient Hospital Stay: Admitting: Oncology

## 2024-05-30 ENCOUNTER — Inpatient Hospital Stay

## 2024-06-01 ENCOUNTER — Inpatient Hospital Stay

## 2024-06-02 ENCOUNTER — Telehealth: Payer: Self-pay

## 2024-06-02 NOTE — Telephone Encounter (Signed)
 Called patient to advise him of his updated appointment time on Monday, 06/05/24. Patient reported he has been snowed in all week and will not be able to come in. He requested to cancel the appointment and stated he will call the clinic to reschedule once he is able to travel into town. Appointments were canceled per patient request, and the provider was notified

## 2024-06-05 ENCOUNTER — Inpatient Hospital Stay

## 2024-06-05 ENCOUNTER — Inpatient Hospital Stay: Admitting: Nurse Practitioner

## 2024-06-07 ENCOUNTER — Inpatient Hospital Stay

## 2024-06-09 ENCOUNTER — Telehealth: Payer: Self-pay | Admitting: Oncology

## 2024-06-09 NOTE — Telephone Encounter (Signed)
 PT called to reschedule appt.again, stating that he has multiple appts at the [place] the same week of treatment. Appts updated day and time confirmed. Please advise due to multiple cancellations.

## 2024-06-13 ENCOUNTER — Inpatient Hospital Stay

## 2024-06-13 ENCOUNTER — Inpatient Hospital Stay: Admitting: Oncology

## 2024-06-13 DIAGNOSIS — C20 Malignant neoplasm of rectum: Secondary | ICD-10-CM

## 2024-06-15 ENCOUNTER — Inpatient Hospital Stay

## 2024-06-20 ENCOUNTER — Inpatient Hospital Stay: Admitting: Oncology

## 2024-06-20 ENCOUNTER — Inpatient Hospital Stay

## 2024-06-22 ENCOUNTER — Inpatient Hospital Stay

## 2024-06-27 ENCOUNTER — Inpatient Hospital Stay

## 2024-06-27 ENCOUNTER — Inpatient Hospital Stay: Admitting: Nurse Practitioner

## 2024-06-29 ENCOUNTER — Inpatient Hospital Stay

## 2024-07-11 ENCOUNTER — Inpatient Hospital Stay

## 2024-07-11 ENCOUNTER — Inpatient Hospital Stay: Admitting: Nurse Practitioner

## 2024-07-13 ENCOUNTER — Inpatient Hospital Stay

## 2024-07-13 ENCOUNTER — Ambulatory Visit: Admitting: Cardiology

## 2024-07-31 ENCOUNTER — Ambulatory Visit: Admitting: Cardiology
